# Patient Record
Sex: Female | Born: 1935
Health system: Southern US, Community
[De-identification: ages and names within clinical notes are randomized; demographics above are authoritative.]

## PROBLEM LIST (undated history)

## (undated) DIAGNOSIS — E1159 Type 2 diabetes mellitus with other circulatory complications: Secondary | ICD-10-CM

## (undated) DIAGNOSIS — R2681 Unsteadiness on feet: Secondary | ICD-10-CM

## (undated) DIAGNOSIS — F32A Depression, unspecified: Secondary | ICD-10-CM

## (undated) DIAGNOSIS — R413 Other amnesia: Secondary | ICD-10-CM

## (undated) DIAGNOSIS — E1169 Type 2 diabetes mellitus with other specified complication: Secondary | ICD-10-CM

## (undated) DIAGNOSIS — M810 Age-related osteoporosis without current pathological fracture: Secondary | ICD-10-CM

## (undated) DIAGNOSIS — I152 Hypertension secondary to endocrine disorders: Secondary | ICD-10-CM

## (undated) DIAGNOSIS — E1142 Type 2 diabetes mellitus with diabetic polyneuropathy: Secondary | ICD-10-CM

## (undated) DIAGNOSIS — E079 Disorder of thyroid, unspecified: Secondary | ICD-10-CM

## (undated) DIAGNOSIS — E119 Type 2 diabetes mellitus without complications: Secondary | ICD-10-CM

## (undated) HISTORY — PX: CHOLECYSTECTOMY: SHX55

## (undated) HISTORY — DX: Type 2 diabetes mellitus with other specified complication: E11.69

## (undated) HISTORY — DX: Age-related osteoporosis without current pathological fracture: M81.0

## (undated) HISTORY — DX: Unsteadiness on feet: R26.81

## (undated) HISTORY — DX: Other amnesia: R41.3

## (undated) HISTORY — DX: Type 2 diabetes mellitus without complications: E11.9

## (undated) HISTORY — PX: ABDOMINAL HYSTERECTOMY: SHX81

## (undated) HISTORY — DX: Type 2 diabetes mellitus with other circulatory complications: E11.59

## (undated) HISTORY — PX: COLON SURGERY: SHX602

## (undated) HISTORY — DX: Depression, unspecified: F32.A

## (undated) HISTORY — DX: Hypertension secondary to endocrine disorders: I15.2

## (undated) HISTORY — DX: Disorder of thyroid, unspecified: E07.9

## (undated) HISTORY — DX: Type 2 diabetes mellitus with diabetic polyneuropathy: E11.42

---

## 1998-04-11 ENCOUNTER — Other Ambulatory Visit: Admission: RE | Admit: 1998-04-11 | Discharge: 1998-04-11 | Payer: Self-pay | Admitting: Family Medicine

## 1999-06-05 ENCOUNTER — Other Ambulatory Visit: Admission: RE | Admit: 1999-06-05 | Discharge: 1999-06-05 | Payer: Self-pay | Admitting: Family Medicine

## 1999-11-25 ENCOUNTER — Encounter: Payer: Self-pay | Admitting: Family Medicine

## 1999-11-25 ENCOUNTER — Encounter: Admission: RE | Admit: 1999-11-25 | Discharge: 1999-11-25 | Payer: Self-pay | Admitting: Family Medicine

## 2000-05-06 ENCOUNTER — Encounter: Payer: Self-pay | Admitting: Family Medicine

## 2000-05-06 ENCOUNTER — Encounter: Admission: RE | Admit: 2000-05-06 | Discharge: 2000-05-06 | Payer: Self-pay | Admitting: Family Medicine

## 2000-06-01 ENCOUNTER — Other Ambulatory Visit: Admission: RE | Admit: 2000-06-01 | Discharge: 2000-06-01 | Payer: Self-pay | Admitting: Family Medicine

## 2000-07-02 ENCOUNTER — Ambulatory Visit (HOSPITAL_COMMUNITY): Admission: RE | Admit: 2000-07-02 | Discharge: 2000-07-02 | Payer: Self-pay | Admitting: Gastroenterology

## 2000-09-16 ENCOUNTER — Emergency Department (HOSPITAL_COMMUNITY): Admission: EM | Admit: 2000-09-16 | Discharge: 2000-09-16 | Payer: Self-pay | Admitting: Emergency Medicine

## 2000-09-16 ENCOUNTER — Encounter: Payer: Self-pay | Admitting: Emergency Medicine

## 2001-06-02 ENCOUNTER — Encounter: Admission: RE | Admit: 2001-06-02 | Discharge: 2001-06-02 | Payer: Self-pay | Admitting: Family Medicine

## 2001-06-02 ENCOUNTER — Encounter: Payer: Self-pay | Admitting: Family Medicine

## 2002-06-08 ENCOUNTER — Encounter: Payer: Self-pay | Admitting: Family Medicine

## 2002-06-08 ENCOUNTER — Encounter: Admission: RE | Admit: 2002-06-08 | Discharge: 2002-06-08 | Payer: Self-pay | Admitting: Family Medicine

## 2003-06-20 ENCOUNTER — Encounter: Admission: RE | Admit: 2003-06-20 | Discharge: 2003-06-20 | Payer: Self-pay | Admitting: Family Medicine

## 2003-06-20 ENCOUNTER — Encounter: Payer: Self-pay | Admitting: Family Medicine

## 2004-07-04 ENCOUNTER — Encounter: Admission: RE | Admit: 2004-07-04 | Discharge: 2004-07-04 | Payer: Self-pay | Admitting: Family Medicine

## 2005-08-12 ENCOUNTER — Encounter: Admission: RE | Admit: 2005-08-12 | Discharge: 2005-08-12 | Payer: Self-pay | Admitting: Family Medicine

## 2006-08-20 ENCOUNTER — Encounter: Admission: RE | Admit: 2006-08-20 | Discharge: 2006-08-20 | Payer: Self-pay | Admitting: Family Medicine

## 2007-09-20 ENCOUNTER — Encounter: Admission: RE | Admit: 2007-09-20 | Discharge: 2007-09-20 | Payer: Self-pay | Admitting: Endocrinology

## 2007-12-22 ENCOUNTER — Encounter: Admission: RE | Admit: 2007-12-22 | Discharge: 2007-12-22 | Payer: Self-pay | Admitting: Endocrinology

## 2008-09-20 ENCOUNTER — Encounter: Admission: RE | Admit: 2008-09-20 | Discharge: 2008-09-20 | Payer: Self-pay | Admitting: Endocrinology

## 2009-09-23 ENCOUNTER — Encounter: Admission: RE | Admit: 2009-09-23 | Discharge: 2009-09-23 | Payer: Self-pay | Admitting: Endocrinology

## 2010-09-30 ENCOUNTER — Encounter
Admission: RE | Admit: 2010-09-30 | Discharge: 2010-09-30 | Payer: Self-pay | Source: Home / Self Care | Attending: Endocrinology | Admitting: Endocrinology

## 2010-12-29 ENCOUNTER — Other Ambulatory Visit (HOSPITAL_COMMUNITY): Payer: Self-pay | Admitting: Surgery

## 2010-12-29 ENCOUNTER — Ambulatory Visit (HOSPITAL_COMMUNITY)
Admission: RE | Admit: 2010-12-29 | Discharge: 2010-12-29 | Disposition: A | Discharge status: Home / Self Care | Payer: Medicare Other | Source: Ambulatory Visit | Attending: Surgery | Admitting: Surgery

## 2010-12-29 ENCOUNTER — Encounter (HOSPITAL_COMMUNITY)
Admission: RE | Admit: 2010-12-29 | Discharge: 2010-12-29 | Disposition: A | Discharge status: Home / Self Care | Payer: Medicare Other | Source: Ambulatory Visit | Attending: Surgery | Admitting: Surgery

## 2010-12-29 DIAGNOSIS — Z01812 Encounter for preprocedural laboratory examination: Secondary | ICD-10-CM | POA: Insufficient documentation

## 2010-12-29 DIAGNOSIS — D126 Benign neoplasm of colon, unspecified: Secondary | ICD-10-CM | POA: Insufficient documentation

## 2010-12-29 DIAGNOSIS — Z01818 Encounter for other preprocedural examination: Secondary | ICD-10-CM | POA: Insufficient documentation

## 2010-12-29 DIAGNOSIS — K635 Polyp of colon: Secondary | ICD-10-CM

## 2010-12-29 DIAGNOSIS — Z01810 Encounter for preprocedural cardiovascular examination: Secondary | ICD-10-CM | POA: Insufficient documentation

## 2010-12-29 LAB — COMPREHENSIVE METABOLIC PANEL
ALT: 11 U/L (ref 0–35)
AST: 20 U/L (ref 0–37)
Albumin: 3.9 g/dL (ref 3.5–5.2)
Alkaline Phosphatase: 62 U/L (ref 39–117)
BUN: 10 mg/dL (ref 6–23)
CO2: 28 mEq/L (ref 19–32)
Calcium: 9.2 mg/dL (ref 8.4–10.5)
Chloride: 104 mEq/L (ref 96–112)
Creatinine, Ser: 0.7 mg/dL (ref 0.4–1.2)
GFR calc Af Amer: >60 mL/min (ref >60)
GFR calc non Af Amer: >60 mL/min (ref >60)
Glucose, Bld: 155 mg/dL — ABNORMAL HIGH (ref 70–99)
Potassium: 4.1 mEq/L (ref 3.5–5.1)
Sodium: 136 mEq/L (ref 135–145)
Total Bilirubin: 0.5 mg/dL (ref 0.3–1.2)
Total Protein: 6.4 g/dL (ref 6.0–8.3)

## 2010-12-29 LAB — DIFFERENTIAL
Basophils Absolute: 0.1 10*3/uL (ref 0.0–0.1)
Basophils Relative: 1 % (ref 0–1)
Eosinophils Absolute: 0.3 10*3/uL (ref 0.0–0.7)
Eosinophils Relative: 4 % (ref 0–5)
Lymphocytes Relative: 31 % (ref 12–46)
Lymphs Abs: 2.3 10*3/uL (ref 0.7–4.0)
Monocytes Absolute: 0.6 10*3/uL (ref 0.1–1.0)
Monocytes Relative: 8 % (ref 3–12)
Neutro Abs: 4.1 10*3/uL (ref 1.7–7.7)
Neutrophils Relative %: 57 % (ref 43–77)

## 2010-12-29 LAB — CBC
HCT: 37.5 % (ref 36.0–46.0)
Hemoglobin: 12.5 g/dL (ref 12.0–15.0)
MCH: 29.1 pg (ref 26.0–34.0)
MCHC: 33.3 g/dL (ref 30.0–36.0)
MCV: 87.2 fL (ref 78.0–100.0)
Platelets: 266 10*3/uL (ref 150–400)
RBC: 4.3 MIL/uL (ref 3.87–5.11)
RDW: 13.3 % (ref 11.5–15.5)
WBC: 7.3 10*3/uL (ref 4.0–10.5)

## 2010-12-29 LAB — SURGICAL PCR SCREEN
MRSA, PCR: NEGATIVE
Staphylococcus aureus: POSITIVE — AB

## 2011-01-01 ENCOUNTER — Ambulatory Visit (HOSPITAL_COMMUNITY)
Admission: RE | Admit: 2011-01-01 | Discharge: 2011-01-01 | Disposition: A | Discharge status: Home / Self Care | Payer: Medicare Other | Source: Ambulatory Visit | Attending: Surgery | Admitting: Surgery

## 2011-01-01 ENCOUNTER — Other Ambulatory Visit: Payer: Self-pay | Admitting: Surgery

## 2011-01-01 DIAGNOSIS — D129 Benign neoplasm of anus and anal canal: Secondary | ICD-10-CM | POA: Insufficient documentation

## 2011-01-01 DIAGNOSIS — D128 Benign neoplasm of rectum: Secondary | ICD-10-CM | POA: Insufficient documentation

## 2011-01-01 LAB — GLUCOSE, CAPILLARY
Glucose-Capillary: 152 mg/dL — ABNORMAL HIGH (ref 70–99)
Glucose-Capillary: 224 mg/dL — ABNORMAL HIGH (ref 70–99)

## 2011-01-05 NOTE — Op Note (Signed)
  NAMEVOLANDA, Jennifer Bailey             ACCOUNT NO.:  [id number]  MEDICAL RECORD NO.:  [id number]           PATIENT TYPE:  O  LOCATION:  [facility]                         FACILITY:  [facility]  PHYSICIAN:  Omkar Stratmann A. Mauri Tolen, M.D.DATE OF BIRTH:  1935-04-25  DATE OF PROCEDURE:  01/01/2011 DATE OF DISCHARGE:  01/01/2011                              OPERATIVE REPORT   PREOPERATIVE DIAGNOSIS:  Tubulovillous adenoma, rectal polyp at dentate line.  POSTOPERATIVE DIAGNOSIS:  Tubulovillous adenoma, rectal polyp at dentate line.  PROCEDURE:  Excision of tubulovillous adenoma at dentate line.  SURGEON:  Maisie Fus A. Neira Bentsen, MD  ANESTHESIA:  General endotracheal anesthesia with 0.25% Sensorcaine local.  ESTIMATED BLOOD LOSS:  Minimal.  SPECIMEN:  Rectal polyp with some hemorrhoidal tissue in the patient's left anterior position of the anal canal just at dentate line to pathology.  DRAINS:  None.  INDICATIONS FOR PROCEDURE:  The patient is a 76 year old female who had a pedunculated polyp right at her anal canal at the dentate line.  It was biopsied and shown to be a tubulovillous adenoma.  It was quite large.  Excision was recommended in the operating room.  I examined her in the office, could easily feel this just at the proximal anal canal at the junction the dentate line.  I recommended excision.  She agreed to proceed.  Risk of bleeding, infection, injury to pelvic organs, and recurrence were discussed.  The patient understood the potential risks and wished to proceed.  DESCRIPTION OF PROCEDURE:  The patient was brought to the operating room.  After induction of general anesthesia, she was placed in lithotomy and appropriately padded.  She received preoperative antibiotics.  Perineum and anal canal were prepped and draped in a sterile fashion.  Time-out was done.  Anoscope was inserted.  The polyp was easily seen, was pedunculated just at the distal rectum at the dentate line as Dr. Madilyn Fireman  had shown on her colonoscopy.  I used an Allis to grab the polyp.  This was within a hemorrhoid complex.  I went ahead and used Harmonic scalpel to take some of the external hemorrhoid complex and internal hemorrhoid complex with the polyp.  Specimen was then passed off the field.  I oversewed the mucosa with 3-0 Monocryl leaving the bottom open for drainage.  Anoscopy was then performed and I saw no other lesions within the distal rectal vault.  She did have some grade 2 and grade 3 internal hemorrhoids that I did not address today. Hemostasis was achieved.  Surgicel was placed with lidocaine jelly as well as Gelfoam and the anal canal was packed.  All final counts of sponge, needle, and instruments were found to be correct at this portion of the case.  The patient was awakened and taken to the recovery room in satisfactory condition.  All final counts were found to be correct.     Nazly Digilio A. Nehemiah Montee, M.D.     TAC/MEDQ  D:  01/01/2011  T:  01/02/2011  Job:  [id number]  cc:   Everardo All. Madilyn Fireman, M.D.  Electronically Signed by Harriette Bouillon M.D. on 01/05/2011 02:41:26 PM

## 2011-02-20 NOTE — Procedures (Signed)
[facility]  Patient:    Jennifer Bailey, Jennifer Bailey                    MRN: [id number] Proc. Date: 07/02/00 Adm. Date:  [id number] Attending:  Louie Bun CC:         Delories Heinz, M.D.   Procedure Report  PROCEDURE:  Colonoscopy.  INDICATION FOR PROCEDURE:  History of adenomatous colon polyp requiring surgical resection in 1997.  DESCRIPTION OF PROCEDURE:  The patient was placed in the left lateral decubitus position and placed on the pulse monitor with continuous low flow oxygen delivered by nasal cannula. She was sedated with 40 mg IV Demerol and 4 mg IV Versed for the previous EGD and no further sedation was required for this procedure. The Olympus video colonoscope was inserted into the rectum and advanced to the cecum, confirmed by transillumination at McBurneys point and visualization of the ileocecal valve and appendiceal orifice. The prep was excellent. The cecum, ascending, transverse, descending and sigmoid colon all appeared normal with no masses, polyps, diverticula or other mucosal abnormalities.  There were a few scattered diverticula in the sigmoid colon. The rectum appeared normal and retroflexed view of the anus revealed no obvious internal hemorrhoids. The colonoscope was then withdrawn and the patient returned to the recovery room in stable condition. The patient tolerated the procedure well and there were no immediate complications.  IMPRESSION:  Few left sided diverticula otherwise normal colonoscopy.  PLAN:  Repeat colonoscopy in 5 years. DD:  07/02/00 TD:  07/02/00 Job: [id number]

## 2011-02-20 NOTE — Procedures (Signed)
[facility]  Patient:    Jennifer Bailey, Jennifer Bailey                    MRN: [id number] Proc. Date: 07/02/00 Adm. Date:  [id number] Attending:  Louie Bun CC:         Delories Heinz, M.D.   Procedure Report  PROCEDURE:  Esophagogastroduodenoscopy.  INDICATIONS FOR PROCEDURE:  A patient scheduled for follow-up colonoscopy after history of adenomatous colon polyps who has been complaining of recent onset of epigastric abdominal pain in the last 2-3 months partially relieved by proton pump inhibitor.  DESCRIPTION OF PROCEDURE:  The patient was placed in the left lateral decubitus position then placed on the pulse monitor with continuous low flow oxygen delivered by nasal cannula. She was sedated with 40 mg IV Demerol and 4 mg IV Versed. The Olympus video endoscope was advanced under direct vision into the oropharynx and esophagus. The esophagus was straight and of normal caliber at the squamocolumnar line at 38 cm. It was somewhat irregular but there were no erosions or exudate, no esophageal ulcer, stricture or ring. There appeared to be a 1 to 1.5 cm sliding hiatal hernia. The stomach was entered and a small amount of liquid secretions were suctioned from the fundus. Retroflexed view of the cardia was unremarkable. The fundus, body, antrum and pylorus all appeared normal. The duodenum was entered and both the bulb and second portion were well inspected and appeared to be within normal limits. The scope was then withdrawn a CLOtest obtained. The patient returned to the recovery room in stable condition. The patient tolerated the procedure well and there were no immediate complications.  IMPRESSION:  Small hiatal hernia otherwise normal endoscopy.  PLAN:  Continue Nexium for now. Will follow-up in the office in 2 months. DD:  07/02/00 TD:  07/02/00 Job: [id number]

## 2011-09-03 ENCOUNTER — Other Ambulatory Visit: Payer: Self-pay | Admitting: Endocrinology

## 2011-09-03 DIAGNOSIS — Z1231 Encounter for screening mammogram for malignant neoplasm of breast: Secondary | ICD-10-CM

## 2011-10-02 ENCOUNTER — Ambulatory Visit
Admission: RE | Admit: 2011-10-02 | Discharge: 2011-10-02 | Disposition: A | Discharge status: Home / Self Care | Payer: Medicare Other | Source: Ambulatory Visit | Attending: Endocrinology | Admitting: Endocrinology

## 2011-10-02 DIAGNOSIS — Z1231 Encounter for screening mammogram for malignant neoplasm of breast: Secondary | ICD-10-CM

## 2012-09-07 ENCOUNTER — Other Ambulatory Visit: Payer: Self-pay | Admitting: Endocrinology

## 2012-09-07 DIAGNOSIS — Z1231 Encounter for screening mammogram for malignant neoplasm of breast: Secondary | ICD-10-CM

## 2012-10-18 ENCOUNTER — Ambulatory Visit
Admission: RE | Admit: 2012-10-18 | Discharge: 2012-10-18 | Disposition: A | Discharge status: Home / Self Care | Payer: Medicare Other | Source: Ambulatory Visit | Attending: Endocrinology | Admitting: Endocrinology

## 2012-10-18 DIAGNOSIS — Z1231 Encounter for screening mammogram for malignant neoplasm of breast: Secondary | ICD-10-CM

## 2013-04-10 ENCOUNTER — Other Ambulatory Visit: Payer: Self-pay | Admitting: *Deleted

## 2013-04-10 MED ORDER — INSULIN DETEMIR 100 UNIT/ML FLEXPEN: PEN_INJECTOR | SUBCUTANEOUS | Status: DC

## 2013-04-10 MED ORDER — INSULIN ASPART 100 UNIT/ML FLEXPEN: PEN_INJECTOR | SUBCUTANEOUS | Status: DC

## 2013-04-10 NOTE — Telephone Encounter (Signed)
Ok to fill 

## 2013-05-29 ENCOUNTER — Other Ambulatory Visit: Payer: Self-pay | Admitting: *Deleted

## 2013-05-29 DIAGNOSIS — IMO0001 Reserved for inherently not codable concepts without codable children: Secondary | ICD-10-CM | POA: Insufficient documentation

## 2013-05-29 DIAGNOSIS — Z794 Long term (current) use of insulin: Secondary | ICD-10-CM | POA: Insufficient documentation

## 2013-05-29 DIAGNOSIS — E11649 Type 2 diabetes mellitus with hypoglycemia without coma: Secondary | ICD-10-CM | POA: Insufficient documentation

## 2013-05-29 DIAGNOSIS — E119 Type 2 diabetes mellitus without complications: Secondary | ICD-10-CM

## 2013-06-06 ENCOUNTER — Other Ambulatory Visit (INDEPENDENT_AMBULATORY_CARE_PROVIDER_SITE_OTHER): Payer: Medicare Other

## 2013-06-06 DIAGNOSIS — E119 Type 2 diabetes mellitus without complications: Secondary | ICD-10-CM

## 2013-06-06 LAB — URINALYSIS
Bilirubin Urine: NEGATIVE
Hgb urine dipstick: NEGATIVE
Ketones, ur: NEGATIVE
Leukocytes, UA: NEGATIVE
Nitrite: NEGATIVE
Specific Gravity, Urine: <=1.005
Total Protein, Urine: NEGATIVE
Urine Glucose: NEGATIVE
Urobilinogen, UA: 0.2
pH: 6 (ref 5.0–8.0)

## 2013-06-06 LAB — COMPREHENSIVE METABOLIC PANEL
ALT: 12 U/L (ref 0–35)
AST: 20 U/L (ref 0–37)
Albumin: 3.8 g/dL (ref 3.5–5.2)
Alkaline Phosphatase: 58 U/L (ref 39–117)
BUN: 15 mg/dL (ref 6–23)
CO2: 27 mEq/L (ref 19–32)
Calcium: 9.1 mg/dL (ref 8.4–10.5)
Chloride: 100 mEq/L (ref 96–112)
Creatinine, Ser: 0.8 mg/dL (ref 0.4–1.2)
GFR: 77.47 mL/min (ref >60.00)
Glucose, Bld: 241 mg/dL — ABNORMAL HIGH (ref 70–99)
Potassium: 3.9 mEq/L (ref 3.5–5.1)
Sodium: 131 mEq/L — ABNORMAL LOW (ref 135–145)
Total Bilirubin: 0.7 mg/dL (ref 0.3–1.2)
Total Protein: 6.7 g/dL (ref 6.0–8.3)

## 2013-06-06 LAB — HEMOGLOBIN A1C: Hgb A1c MFr Bld: 8 % — ABNORMAL HIGH (ref 4.6–6.5)

## 2013-06-06 LAB — MICROALBUMIN / CREATININE URINE RATIO
Creatinine,U: 23.8 mg/dL
Microalb Creat Ratio: 4.2 mg/g (ref 0.0–30.0)
Microalb, Ur: 1 mg/dL (ref 0.0–1.9)

## 2013-06-09 ENCOUNTER — Ambulatory Visit (INDEPENDENT_AMBULATORY_CARE_PROVIDER_SITE_OTHER): Payer: Medicare Other | Admitting: Endocrinology

## 2013-06-09 ENCOUNTER — Encounter: Payer: Self-pay | Admitting: Endocrinology

## 2013-06-09 VITALS — BP 130/70 | HR 69 | Temp 98.4°F | Resp 10 | Ht 59.0 in | Wt 130.9 lb

## 2013-06-09 DIAGNOSIS — E871 Hypo-osmolality and hyponatremia: Secondary | ICD-10-CM

## 2013-06-09 DIAGNOSIS — E7800 Pure hypercholesterolemia, unspecified: Secondary | ICD-10-CM

## 2013-06-09 DIAGNOSIS — I10 Essential (primary) hypertension: Secondary | ICD-10-CM

## 2013-06-09 DIAGNOSIS — E039 Hypothyroidism, unspecified: Secondary | ICD-10-CM

## 2013-06-09 DIAGNOSIS — E119 Type 2 diabetes mellitus without complications: Secondary | ICD-10-CM

## 2013-06-09 NOTE — Progress Notes (Signed)
Patient ID: Jennifer Bailey, female   DOB: 09/18/1935, 78 y.o.   MRN: [id number]  Jennifer Bailey is an 78 y.o. female.   Reason for Appointment: Diabetes follow-up   History of Present Illness   Diagnosis: Type 2 DIABETES MELITUS, date of diagnosis: 2003  She has been on insulin for several years but continues to have labile blood sugars despite using basal bolus regimen She is very compliant with her insulin at mealtimes and is consistent with the timing of her Levemir insulin twice a day  she is taking small amounts of insulin and is not on any oral hypoglycemics, appears to be completely insulin-dependent Her diet is somewhat variable but she is usually having balanced meals and not eating out excessively     She had gained some weight on the last visit but has lost 3 pounds  RECENT history: She appears to be getting more significant hypoglycemia and then the last visit especially on waking up, lowest reading 47 Also occasionally has low readings after supper More recently has not had significant hyperglycemia with only occasional readings over 200, was higher this morning possibly from rebound of low sugar last night     Insulin regimen: Levemir 8 bid (9 in am till 10d ago) Bfst 3-4, lunch 3-4 acs3-5        Proper timing of medications in relation to meals: Yes.          Monitors blood glucose:  up to 4 times a day.    Glucometer: One Touch.          Blood Glucose readings from meter download: readings before breakfast:  Recently 47-226, midmorning 47-218, lunchtime 58-178 and suppertime 80-208  PC supper/bedtime 52-240 Overall median 122, lowest median after supper, only 70 with a few readings; fasting median 90 and highest median of 141 at bedtime  Hypoglycemia frequency: 3 times a week recently, mostly in the morning, occasionally after supper and sometimes after breakfast.          Meals: 3 meals per day.  trying to have smaller portions for the last 3-4 weeks and drinking  more water    Physical activity: exercise: Trying to do more walking            Dietician visit: Most recent: 2004           Complications: are: Mild neuropathy controlled symptomatically     The last HbgA1c was reported as 7.3, previously 6.9    Wt Readings from Last 3 Encounters:  06/09/13 130 lb 14.4 oz (59.376 kg)   HYPONATREMIA: Her sodium is 131, usually normal, not on HCTZ. She is trying to drink much more water to lose weight  LABS:  Appointment on 06/06/2013  Component Date Value Range Status  . Hemoglobin [name] 06/06/2013 8.0* 4.6 - 6.5 % Final   Glycemic Control Guidelines for People with Diabetes:Non Diabetic:  <6%Goal of Therapy: <7%Additional Action Suggested:  >8%   . Sodium 06/06/2013 131* 135 - 145 mEq/L Final  . Potassium 06/06/2013 3.9  3.5 - 5.1 mEq/L Final  . Chloride 06/06/2013 100  96 - 112 mEq/L Final  . CO2 06/06/2013 27  19 - 32 mEq/L Final  . Glucose, Bld 06/06/2013 241* 70 - 99 mg/dL Final  . BUN 78/29/5621 15  6 - 23 mg/dL Final  . Creatinine, Ser 06/06/2013 0.8  0.4 - 1.2 mg/dL Final  . Total Bilirubin 06/06/2013 0.7  0.3 - 1.2 mg/dL Final  . Alkaline Phosphatase 06/06/2013  58  39 - 117 U/L Final  . AST 06/06/2013 20  0 - 37 U/L Final  . ALT 06/06/2013 12  0 - 35 U/L Final  . Total Protein 06/06/2013 6.7  6.0 - 8.3 g/dL Final  . Albumin 40/98/1191 3.8  3.5 - 5.2 g/dL Final  . Calcium 47/82/9562 9.1  8.4 - 10.5 mg/dL Final  . GFR 13/05/6577 77.47  >60.00 mL/min Final  . Color, Urine 06/06/2013 LT. YELLOW  Yellow;Lt. Yellow Final  . APPearance 06/06/2013 CLEAR  Clear Final  . Specific Gravity, Urine 06/06/2013 <=1.005  1.000 - 1.030 Final  . pH 06/06/2013 6.0  5.0 - 8.0 Final  . Total Protein, Urine 06/06/2013 NEGATIVE  Negative Final  . Urine Glucose 06/06/2013 NEGATIVE  Negative Final  . Ketones, ur 06/06/2013 NEGATIVE  Negative Final  . Bilirubin Urine 06/06/2013 NEGATIVE  Negative Final  . Hgb urine dipstick 06/06/2013 NEGATIVE  Negative Final   . Urobilinogen, UA 06/06/2013 0.2  0.0 - 1.0 Final  . Leukocytes, UA 06/06/2013 NEGATIVE  Negative Final  . Nitrite 06/06/2013 NEGATIVE  Negative Final  . Microalb, Ur 06/06/2013 1.0  0.0 - 1.9 mg/dL Final  . Creatinine,U 46/96/2952 23.8   Final  . Microalb Creat Ratio 06/06/2013 4.2  0.0 - 30.0 mg/g Final      Medication List       This list is accurate as of: 06/09/13 11:59 PM.  Always use your most recent med list.               alendronate 70 MG tablet  Commonly known as:  FOSAMAX     aspirin 81 MG tablet  Take 81 mg by mouth daily.     BD PEN NEEDLE NANO U/F 32G X 4 MM Misc  Generic drug:  Insulin Pen Needle     fish oil-omega-3 fatty acids 1000 MG capsule  Take 2 g by mouth daily. Two a day     gabapentin 100 MG capsule  Commonly known as:  NEURONTIN  100 mg. 3 capsules qhs     insulin aspart 100 UNIT/ML Sopn FlexPen  Commonly known as:  NOVOLOG FLEXPEN  INJECT 3 TO 5 UNITS SUBCUTANEOUSLY BEFORE MEALS AS DIRECTED.     Insulin Detemir 100 UNIT/ML Sopn  INJECT 8 UNITS SUBCUTANEOUSLY IN THE MORNING AND 8 UNITS AT NIGHT.     levothyroxine 75 MCG tablet  Commonly known as:  SYNTHROID, LEVOTHROID  75 mcg.     losartan 50 MG tablet  Commonly known as:  COZAAR  50 mg.     lovastatin 40 MG tablet  Commonly known as:  MEVACOR  40 mg.     omeprazole 20 MG capsule  Commonly known as:  PRILOSEC  20 mg.     ONE TOUCH ULTRA TEST test strip  Generic drug:  glucose blood     PRESERVISION AREDS 2 Caps  Take by mouth.     vitamin C 100 MG tablet  Take 100 mg by mouth daily.     Vitamin D (Ergocalciferol) 50000 UNITS Caps capsule  Commonly known as:  DRISDOL  Take 50,000 Units by mouth every 30 (thirty) days.        Allergies:  Allergies  Allergen Reactions  . Macrodantin [Nitrofurantoin Macrocrystal]   . Penicillins     No past medical history on file.  Past Surgical History  Procedure Laterality Date  . Abdominal hysterectomy      Family  History  Problem Relation Age of  Onset  . Cancer Mother   . Diabetes Brother     Social History:  reports that she has never smoked. She has never used smokeless tobacco. Her alcohol and drug histories are not on file.  Review of Systems:  HYPERTENSION:  she thinks her blood pressure readings are 95-120 systolic but does not feel lightheaded, blood pressure usually is higher in the office  HYPOTHYROIDISM: Fairly stable with 75 mcg supplement  HYPERLIPIDEMIA: The lipid abnormality consists of elevated LDL, currently taking lovastatin with good control.  She has had a history of burning and tingling in her feet controlled with gabapentin      Examination:   BP 130/70  Pulse 69  Temp(Src) 98.4 F (36.9 C)  Resp 10  Ht 4\' 11"  (1.499 m)  Wt 130 lb 14.4 oz (59.376 kg)  BMI 26.42 kg/m2  SpO2 98%  Body mass index is 26.42 kg/(m^2).   No pedal edema Biceps reflexes are normal  ASSESSMENT/ PLAN::   Diabetes type 2   The patient's diabetes control appears to be more difficult with tendency to hypoglycemia in the morning and after supper but not clear why her [name] is higher at 8% even though her recent average glucose is only 128 However her glucose monitor has the wrong date and time and not clear if other readings are recent Also her blood sugars appear to be relatively more stable compared to previous tendency to labile readings Low blood sugars may be more related to her improving her diet and reducing portions but also a low early morning She will need to monitor hypoglycemia first before considering high readings, so these are from rebound  PLAN: She will reduce her evening Levemir and also one unit from her suppertime coverage Discussed how to adjust her evening Levemir based on fasting blood sugar trend She will call if she has consistent hypoglycemia  HYPONATREMIA: This is unusual and may be related to her drinking large volumes of water recently, advised her to cut back  on this by at least a third  [facility] 06/14/2013, 12:48 PM

## 2013-06-09 NOTE — Patient Instructions (Addendum)
Use 7 Levemir in pms; if am sugar is still low then cut to 6  use 2-3 Novolog at supper  Reduce daytime fluid by at least a 1/3

## 2013-06-14 ENCOUNTER — Encounter: Payer: Self-pay | Admitting: Endocrinology

## 2013-06-14 DIAGNOSIS — E1159 Type 2 diabetes mellitus with other circulatory complications: Secondary | ICD-10-CM | POA: Insufficient documentation

## 2013-06-14 DIAGNOSIS — E1169 Type 2 diabetes mellitus with other specified complication: Secondary | ICD-10-CM | POA: Insufficient documentation

## 2013-07-10 ENCOUNTER — Other Ambulatory Visit: Payer: Self-pay | Admitting: *Deleted

## 2013-07-10 MED ORDER — LEVOTHYROXINE SODIUM 75 MCG PO TABS: 75.0000 ug | ORAL_TABLET | Freq: Every day | ORAL | Status: DC

## 2013-08-07 ENCOUNTER — Other Ambulatory Visit (INDEPENDENT_AMBULATORY_CARE_PROVIDER_SITE_OTHER): Payer: Medicare PPO

## 2013-08-07 DIAGNOSIS — E871 Hypo-osmolality and hyponatremia: Secondary | ICD-10-CM

## 2013-08-07 DIAGNOSIS — E039 Hypothyroidism, unspecified: Secondary | ICD-10-CM

## 2013-08-07 DIAGNOSIS — E7800 Pure hypercholesterolemia, unspecified: Secondary | ICD-10-CM

## 2013-08-07 LAB — TSH: TSH: 1.13 u[IU]/mL (ref 0.35–5.50)

## 2013-08-07 LAB — BASIC METABOLIC PANEL
BUN: 17 mg/dL (ref 6–23)
CO2: 29 mEq/L (ref 19–32)
Calcium: 9.5 mg/dL (ref 8.4–10.5)
Chloride: 103 mEq/L (ref 96–112)
Creatinine, Ser: 0.8 mg/dL (ref 0.4–1.2)
GFR: 79.82 mL/min (ref >60.00)
Glucose, Bld: 181 mg/dL — ABNORMAL HIGH (ref 70–99)
Potassium: 4.5 mEq/L (ref 3.5–5.1)
Sodium: 137 mEq/L (ref 135–145)

## 2013-08-07 LAB — T4, FREE: Free T4: 1.07 ng/dL (ref 0.60–1.60)

## 2013-08-07 LAB — LIPID PANEL
Cholesterol: 140 mg/dL (ref 0–200)
HDL: 50.2 mg/dL (ref >39.00)
LDL Cholesterol: 67 mg/dL (ref 0–99)
Total CHOL/HDL Ratio: 3
Triglycerides: 116 mg/dL (ref 0.0–149.0)
VLDL: 23.2 mg/dL (ref 0.0–40.0)

## 2013-08-11 ENCOUNTER — Ambulatory Visit (INDEPENDENT_AMBULATORY_CARE_PROVIDER_SITE_OTHER): Payer: Medicare PPO | Admitting: Endocrinology

## 2013-08-11 ENCOUNTER — Encounter: Payer: Self-pay | Admitting: Endocrinology

## 2013-08-11 VITALS — BP 128/72 | HR 69 | Temp 98.5°F | Resp 12 | Ht 59.0 in | Wt 131.0 lb

## 2013-08-11 DIAGNOSIS — E039 Hypothyroidism, unspecified: Secondary | ICD-10-CM

## 2013-08-11 DIAGNOSIS — E7800 Pure hypercholesterolemia, unspecified: Secondary | ICD-10-CM

## 2013-08-11 DIAGNOSIS — IMO0001 Reserved for inherently not codable concepts without codable children: Secondary | ICD-10-CM

## 2013-08-11 DIAGNOSIS — E1142 Type 2 diabetes mellitus with diabetic polyneuropathy: Secondary | ICD-10-CM

## 2013-08-11 DIAGNOSIS — I10 Essential (primary) hypertension: Secondary | ICD-10-CM

## 2013-08-11 NOTE — Patient Instructions (Addendum)
Novolog lunch 2-3 units; Supper 4-5 units  Levemir 6 in pm if am sugars get low again

## 2013-08-11 NOTE — Progress Notes (Signed)
Patient ID: Jennifer Bailey, female   DOB: 1935-04-02, 78 y.o.   MRN: [id number]  Jennifer Bailey is an 78 y.o. female.   Reason for Appointment: Diabetes follow-up   History of Present Illness   Diagnosis: Type 2 DIABETES MELITUS, date of diagnosis: 2003  She has been on insulin for several years but continues to have labile blood sugars despite using basal bolus regimen She is very compliant with her insulin at mealtimes and is consistent with the timing of her Levemir insulin twice a day Is taking small amounts of insulin and is not on any oral hypoglycemics, appears to be completely insulin-dependent Her diet is somewhat variable but she is usually having balanced meals and not eating out excessively      RECENT history: She is not having as much hypoglycemia in the morning with reducing her evening Levemir in the last visit However still has significant variability in blood sugars at all times More recently is having low normal readings after lunch but relatively higher readings after supper     Insulin regimen: Levemir 8--7 bid  mealtime coverage breakfast:  3-4, lunch 3-4 acs3-5        Proper timing of medications in relation to meals: Yes.          Monitors blood glucose:  up to 4 times a day.    Glucometer: One Touch.          Blood Glucose readings from meter download: readings before breakfast: 61-217 with MEDIAN 127 LUNCH time 65 with a range of 65-266 Before supper 71-289 with median 186 and PC supper/bedtime 118-335 with median 222 Hypoglycemia frequency: Minimal with occasional 60s in the morning or afternoon      Meals: 3 meals per day.  trying to have smaller portions for the last 3-4 weeks and drinking more water    Physical activity: exercise: Trying to do more walking or tv exercise           Dietician visit: Most recent: 2004           Complications: are: Mild neuropathy controlled symptomatically     The last HbgA1c was reported as 7.3   Wt Readings from  Last 3 Encounters:  08/11/13 131 lb (59.421 kg)  06/09/13 130 lb 14.4 oz (59.376 kg)   Lab Results  Component Value Date   HGBA1C 8.0* 06/06/2013   Lab Results  Component Value Date   MICROALBUR 1.0 06/06/2013   LDLCALC 67 08/07/2013   CREATININE 0.8 08/07/2013     LABS:  Appointment on 08/07/2013  Component Date Value Range Status  . Sodium 08/07/2013 137  135 - 145 mEq/L Final  . Potassium 08/07/2013 4.5  3.5 - 5.1 mEq/L Final  . Chloride 08/07/2013 103  96 - 112 mEq/L Final  . CO2 08/07/2013 29  19 - 32 mEq/L Final  . Glucose, Bld 08/07/2013 181* 70 - 99 mg/dL Final  . BUN 78/29/5621 17  6 - 23 mg/dL Final  . Creatinine, Ser 08/07/2013 0.8  0.4 - 1.2 mg/dL Final  . Calcium 30/86/5784 9.5  8.4 - 10.5 mg/dL Final  . GFR 69/62/9528 79.82  >60.00 mL/min Final  . Free T4 08/07/2013 1.07  0.60 - 1.60 ng/dL Final  . TSH 41/32/4401 1.13  0.35 - 5.50 uIU/mL Final  . Cholesterol 08/07/2013 140  0 - 200 mg/dL Final   ATP III Classification       Desirable:  < 200 mg/dL  Borderline High:  200 - 239 mg/dL          High:  > = 409 mg/dL  . Triglycerides 08/07/2013 116.0  0.0 - 149.0 mg/dL Final   Normal:  <811 mg/dLBorderline High:  150 - 199 mg/dL  . HDL 08/07/2013 50.20  >39.00 mg/dL Final  . VLDL 91/47/8295 23.2  0.0 - 40.0 mg/dL Final  . LDL Cholesterol 08/07/2013 67  0 - 99 mg/dL Final  . Total CHOL/HDL Ratio 08/07/2013 3   Final                  Men          Women1/2 Average Risk     3.4          3.3Average Risk          5.0          4.42X Average Risk          9.6          7.13X Average Risk          15.0          11.0                          Medication List       This list is accurate as of: 08/11/13 11:55 AM.  Always use your most recent med list.               alendronate 70 MG tablet  Commonly known as:  FOSAMAX     aspirin 81 MG tablet  Take 81 mg by mouth daily.     BD PEN NEEDLE NANO U/F 32G X 4 MM Misc  Generic drug:  Insulin Pen Needle     fish  oil-omega-3 fatty acids 1000 MG capsule  Take 2 g by mouth daily. Two a day     FLUARIX QUADRIVALENT 0.5 ML injection  Generic drug:  influenza vac split quadrivalent PF     gabapentin 100 MG capsule  Commonly known as:  NEURONTIN  100 mg. 3 capsules qhs     insulin aspart 100 UNIT/ML Sopn FlexPen  Commonly known as:  NOVOLOG FLEXPEN  INJECT 3 TO 5 UNITS SUBCUTANEOUSLY BEFORE MEALS AS DIRECTED.     Insulin Detemir 100 UNIT/ML Sopn  INJECT 8 UNITS SUBCUTANEOUSLY IN THE MORNING AND 8 UNITS AT NIGHT.     levothyroxine 75 MCG tablet  Commonly known as:  SYNTHROID, LEVOTHROID  Take 1 tablet (75 mcg total) by mouth daily before breakfast.     losartan 50 MG tablet  Commonly known as:  COZAAR  50 mg.     lovastatin 40 MG tablet  Commonly known as:  MEVACOR  40 mg.     omeprazole 20 MG capsule  Commonly known as:  PRILOSEC  20 mg.     ONE TOUCH ULTRA TEST test strip  Generic drug:  glucose blood     PRESERVISION AREDS 2 Caps  Take by mouth.     vitamin C 100 MG tablet  Take 100 mg by mouth daily.     Vitamin D (Ergocalciferol) 50000 UNITS Caps capsule  Commonly known as:  DRISDOL  Take 50,000 Units by mouth every 30 (thirty) days.        Allergies:  Allergies  Allergen Reactions  . Macrodantin [Nitrofurantoin Macrocrystal]   . Penicillins     No past medical history on file.  Past  Surgical History  Procedure Laterality Date  . Abdominal hysterectomy      Family History  Problem Relation Age of Onset  . Cancer Mother   . Diabetes Brother     Social History:  reports that she has never smoked. She has never used smokeless tobacco. Her alcohol and drug histories are not on file.  Review of Systems:  HYPERTENSION:  she thinks her blood pressure readings are  105-125 systolic but does not feel lightheaded, blood pressure usually is higher in the office  HYPOTHYROIDISM: Fairly stable with 75 mcg supplement  HYPERLIPIDEMIA: The lipid abnormality consists  of elevated LDL, currently taking lovastatin with good control.  She has had a history of burning and tingling in her feet controlled with gabapentin      Examination:   BP 128/72  Pulse 69  Temp(Src) 98.5 F (36.9 C)  Resp 12  Ht 4\' 11"  (1.499 m)  Wt 131 lb (59.421 kg)  BMI 26.44 kg/m2  SpO2 99%  Body mass index is 26.44 kg/(m^2).   No pedal edema  ASSESSMENT/ PLAN::   Diabetes type 2   The patient's diabetes control appears to be relatively better although she still has her usual fluctuation Her median blood sugar is moderately increased but her standard deviation is nearly 70 However considering her age and the duration of diabetes and propensity to hypoglycemia she is probably reasonably well-controlled Currently her morning blood sugars again trending relatively lower but not consistent despite taking a unit less on her Levemir since her last visit Postprandial readings are relatively lower after lunch but higher after supper and insulin doses will need to be adjusted accordingly  PLAN: She will reduce her evening Levemir by 1 unit if her morning sugars start getting low again NOVOLOG insulin: She will need to reduce it by at least one unit for lunch and increase it by 1 unit for supper She will call if she has inconsistent blood sugars  HYPONATREMIA:  Resolved with stopping excessive fluid intake  Daxson Reffett 08/11/2013, 11:55 AM

## 2013-08-14 DIAGNOSIS — E1142 Type 2 diabetes mellitus with diabetic polyneuropathy: Secondary | ICD-10-CM | POA: Insufficient documentation

## 2013-08-22 ENCOUNTER — Other Ambulatory Visit: Payer: Self-pay | Admitting: Endocrinology

## 2013-09-11 ENCOUNTER — Other Ambulatory Visit: Payer: Self-pay | Admitting: *Deleted

## 2013-09-11 MED ORDER — LOSARTAN POTASSIUM 50 MG PO TABS: 50.0000 mg | ORAL_TABLET | Freq: Every day | ORAL | Status: DC

## 2013-09-20 ENCOUNTER — Other Ambulatory Visit: Payer: Self-pay | Admitting: Endocrinology

## 2013-10-10 ENCOUNTER — Other Ambulatory Visit: Payer: Self-pay

## 2013-10-10 DIAGNOSIS — Z1231 Encounter for screening mammogram for malignant neoplasm of breast: Secondary | ICD-10-CM

## 2013-11-09 ENCOUNTER — Other Ambulatory Visit: Payer: Self-pay | Admitting: Endocrinology

## 2013-11-10 ENCOUNTER — Ambulatory Visit
Admission: RE | Admit: 2013-11-10 | Discharge: 2013-11-10 | Disposition: A | Discharge status: Home / Self Care | Payer: Medicare Other | Source: Ambulatory Visit

## 2013-11-10 DIAGNOSIS — Z1231 Encounter for screening mammogram for malignant neoplasm of breast: Secondary | ICD-10-CM

## 2013-11-13 ENCOUNTER — Other Ambulatory Visit: Payer: Medicare PPO

## 2013-11-13 ENCOUNTER — Ambulatory Visit: Payer: Medicare PPO

## 2013-11-14 ENCOUNTER — Other Ambulatory Visit (INDEPENDENT_AMBULATORY_CARE_PROVIDER_SITE_OTHER): Payer: Medicare Other

## 2013-11-14 ENCOUNTER — Other Ambulatory Visit: Payer: Medicare Other

## 2013-11-14 DIAGNOSIS — E1142 Type 2 diabetes mellitus with diabetic polyneuropathy: Secondary | ICD-10-CM

## 2013-11-14 DIAGNOSIS — IMO0001 Reserved for inherently not codable concepts without codable children: Secondary | ICD-10-CM

## 2013-11-14 DIAGNOSIS — E1165 Type 2 diabetes mellitus with hyperglycemia: Principal | ICD-10-CM

## 2013-11-14 LAB — COMPREHENSIVE METABOLIC PANEL
ALT: 13 U/L (ref 0–35)
AST: 18 U/L (ref 0–37)
Albumin: 3.7 g/dL (ref 3.5–5.2)
Alkaline Phosphatase: 66 U/L (ref 39–117)
BUN: 15 mg/dL (ref 6–23)
CO2: 26 mEq/L (ref 19–32)
Calcium: 9.2 mg/dL (ref 8.4–10.5)
Chloride: 104 mEq/L (ref 96–112)
Creatinine, Ser: 0.7 mg/dL (ref 0.4–1.2)
GFR: 87.82 mL/min (ref >60.00)
Glucose, Bld: 129 mg/dL — ABNORMAL HIGH (ref 70–99)
Potassium: 4.5 mEq/L (ref 3.5–5.1)
Sodium: 138 mEq/L (ref 135–145)
Total Bilirubin: 0.6 mg/dL (ref 0.3–1.2)
Total Protein: 6.7 g/dL (ref 6.0–8.3)

## 2013-11-14 LAB — CBC WITH DIFFERENTIAL/PLATELET
Basophils Absolute: 0 10*3/uL (ref 0.0–0.1)
Basophils Relative: 0.4 % (ref 0.0–3.0)
Eosinophils Absolute: 0.6 10*3/uL (ref 0.0–0.7)
Eosinophils Relative: 6.5 % — ABNORMAL HIGH (ref 0.0–5.0)
HCT: 36.7 % (ref 36.0–46.0)
Hemoglobin: 12.1 g/dL (ref 12.0–15.0)
Lymphocytes Relative: 27.8 % (ref 12.0–46.0)
Lymphs Abs: 2.5 10*3/uL (ref 0.7–4.0)
MCHC: 33 g/dL (ref 30.0–36.0)
MCV: 91.3 fl (ref 78.0–100.0)
Monocytes Absolute: 0.7 10*3/uL (ref 0.1–1.0)
Monocytes Relative: 7.9 % (ref 3.0–12.0)
Neutro Abs: 5.2 10*3/uL (ref 1.4–7.7)
Neutrophils Relative %: 57.4 % (ref 43.0–77.0)
Platelets: 273 10*3/uL (ref 150.0–400.0)
RBC: 4.02 Mil/uL (ref 3.87–5.11)
RDW: 13.9 % (ref 11.5–14.6)
WBC: 9 10*3/uL (ref 4.5–10.5)

## 2013-11-14 LAB — HEMOGLOBIN A1C: Hgb A1c MFr Bld: 8.5 % — ABNORMAL HIGH (ref 4.6–6.5)

## 2013-11-17 ENCOUNTER — Encounter: Payer: Self-pay | Admitting: Endocrinology

## 2013-11-17 ENCOUNTER — Ambulatory Visit (INDEPENDENT_AMBULATORY_CARE_PROVIDER_SITE_OTHER): Payer: Medicare Other | Admitting: Endocrinology

## 2013-11-17 VITALS — BP 102/50 | HR 70 | Temp 98.3°F | Resp 14 | Ht 59.0 in | Wt 130.6 lb

## 2013-11-17 DIAGNOSIS — IMO0001 Reserved for inherently not codable concepts without codable children: Secondary | ICD-10-CM

## 2013-11-17 DIAGNOSIS — M810 Age-related osteoporosis without current pathological fracture: Secondary | ICD-10-CM

## 2013-11-17 DIAGNOSIS — E1165 Type 2 diabetes mellitus with hyperglycemia: Principal | ICD-10-CM

## 2013-11-17 DIAGNOSIS — E039 Hypothyroidism, unspecified: Secondary | ICD-10-CM

## 2013-11-17 DIAGNOSIS — I10 Essential (primary) hypertension: Secondary | ICD-10-CM

## 2013-11-17 NOTE — Patient Instructions (Signed)
Losartan 1/2 tab daily

## 2013-11-17 NOTE — Progress Notes (Signed)
Patient ID: Jennifer Bailey, female   DOB: [birth year], 79 y.o.   MRN: [id number]   Reason for Appointment: Diabetes follow-up   History of Present Illness   Diagnosis: Type 2 DIABETES MELITUS, date of diagnosis: 2003  She has been on insulin for several years but continues to have labile blood sugars despite using basal bolus regimen She is very compliant with her insulin at mealtimes and is consistent with the timing of her Levemir insulin twice a day Is taking small amounts of insulin and is not on any oral hypoglycemics, appears to be completely insulin-dependent Her diet is somewhat variable but she is usually having balanced meals and not eating out excessively      RECENT history:   Her [name] is relatively higher at 8.5% even though her blood sugars are not averaging very high at home She does have some fluctuation in her blood sugars especially at lunchtime and bedtime which is not unusual He usually trying to adjust her mealtime dose based on her meal size and blood sugar level but still unexpected high readings She is not having as much hypoglycemia      Insulin regimen: Levemir 8--7 bid  mealtime coverage breakfast: 3-4, lunch and supper 4-5       Proper timing of medications in relation to meals: Yes.          Monitors blood glucose:  up to 4 times a day.    Glucometer: One Touch.          Blood Glucose readings from meter download:   PREMEAL Breakfast Lunch Dinner Bedtime Overall  Glucose range:  60-266   52-317   83-245   80-305    Mean/median:  147      153 +/-65    Hypoglycemia frequency: Occasionally waking up and once after lunch and once after supper last night, treated with Soft drink or glucose tablets      Meals: 3 meals per day.  Usually has small portions Physical activity: exercise: Less walking recently, doing some tv exercise           Dietician visit: Most recent: [id number]           Complications: are: Mild neuropathy controlled symptomatically      Wt  Readings from Last 3 Encounters:  11/17/13 130 lb 9.6 oz (59.24 kg)  08/11/13 131 lb (59.421 kg)  06/09/13 130 lb 14.4 oz (59.376 kg)   Lab Results  Component Value Date   HGBA1C 8.5* 11/14/2013   HGBA1C 8.0* 06/06/2013   Lab Results  Component Value Date   MICROALBUR 1.0 06/06/2013   LDLCALC 67 08/07/2013   CREATININE 0.7 11/14/2013     LABS:  Appointment on 11/14/2013  Component Date Value Ref Range Status  . Hemoglobin [name] 11/14/2013 8.5* 4.6 - 6.5 % Final   Glycemic Control Guidelines for People with Diabetes:Non Diabetic:  <6%Goal of Therapy: <7%Additional Action Suggested:  >8%   . Sodium 11/14/2013 138  135 - 145 mEq/L Final  . Potassium 11/14/2013 4.5  3.5 - 5.1 mEq/L Final  . Chloride 11/14/2013 104  96 - 112 mEq/L Final  . CO2 11/14/2013 26  19 - 32 mEq/L Final  . Glucose, Bld 11/14/2013 129* 70 - 99 mg/dL Final  . BUN 11/14/2013 15  6 - 23 mg/dL Final  . Creatinine, Ser 11/14/2013 0.7  0.4 - 1.2 mg/dL Final  . Total Bilirubin 11/14/2013 0.6  0.3 - 1.2 mg/dL Final  . Alkaline Phosphatase 11/14/2013  66  39 - 117 U/L Final  . AST 11/14/2013 18  0 - 37 U/L Final  . ALT 11/14/2013 13  0 - 35 U/L Final  . Total Protein 11/14/2013 6.7  6.0 - 8.3 g/dL Final  . Albumin 11/14/2013 3.7  3.5 - 5.2 g/dL Final  . Calcium 11/14/2013 9.2  8.4 - 10.5 mg/dL Final  . GFR 11/14/2013 87.82  >60.00 mL/min Final  . WBC 11/14/2013 9.0  4.5 - 10.5 K/uL Final  . RBC 11/14/2013 4.02  3.87 - 5.11 Mil/uL Final  . Hemoglobin 11/14/2013 12.1  12.0 - 15.0 g/dL Final  . HCT 11/14/2013 36.7  36.0 - 46.0 % Final  . MCV 11/14/2013 91.3  78.0 - 100.0 fl Final  . MCHC 11/14/2013 33.0  30.0 - 36.0 g/dL Final  . RDW 11/14/2013 13.9  11.5 - 14.6 % Final  . Platelets 11/14/2013 273.0  150.0 - 400.0 K/uL Final  . Neutrophils Relative % 11/14/2013 57.4  43.0 - 77.0 % Final  . Lymphocytes Relative 11/14/2013 27.8  12.0 - 46.0 % Final  . Monocytes Relative 11/14/2013 7.9  3.0 - 12.0 % Final  . Eosinophils  Relative 11/14/2013 6.5* 0.0 - 5.0 % Final  . Basophils Relative 11/14/2013 0.4  0.0 - 3.0 % Final  . Neutro Abs 11/14/2013 5.2  1.4 - 7.7 K/uL Final  . Lymphs Abs 11/14/2013 2.5  0.7 - 4.0 K/uL Final  . Monocytes Absolute 11/14/2013 0.7  0.1 - 1.0 K/uL Final  . Eosinophils Absolute 11/14/2013 0.6  0.0 - 0.7 K/uL Final  . Basophils Absolute 11/14/2013 0.0  0.0 - 0.1 K/uL Final      Medication List       This list is accurate as of: 11/17/13 10:41 AM.  Always use your most recent med list.               alendronate 70 MG tablet  Commonly known as:  FOSAMAX  TAKE 1 TABLET BY MOUTH ONCE WEEKLY.     aspirin 81 MG tablet  Take 81 mg by mouth daily.     BD PEN NEEDLE NANO U/F 32G X 4 MM Misc  Generic drug:  Insulin Pen Needle     BD PEN NEEDLE NANO U/F 32G X 4 MM Misc  Generic drug:  Insulin Pen Needle  USE AS DIRECTED FIVE TIMES A DAY.     fish oil-omega-3 fatty acids 1000 MG capsule  Take 2 g by mouth daily. Two a day     FLUARIX QUADRIVALENT 0.5 ML injection  Generic drug:  influenza vac split quadrivalent PF     gabapentin 100 MG capsule  Commonly known as:  NEURONTIN  TAKE 3 TO 4 CAPSULES BY MOUTH AT BEDTIME.     insulin aspart 100 UNIT/ML FlexPen  Commonly known as:  NOVOLOG FLEXPEN  INJECT 3 TO 5 UNITS SUBCUTANEOUSLY BEFORE MEALS AS DIRECTED.     Insulin Detemir 100 UNIT/ML Pen  Commonly known as:  LEVEMIR  INJECT 8 UNITS SUBCUTANEOUSLY IN THE MORNING AND 7 UNITS AT NIGHT.     levothyroxine 75 MCG tablet  Commonly known as:  SYNTHROID, LEVOTHROID  Take 1 tablet (75 mcg total) by mouth daily before breakfast.     losartan 50 MG tablet  Commonly known as:  COZAAR  Take 1 tablet (50 mg total) by mouth daily.     lovastatin 40 MG tablet  Commonly known as:  MEVACOR  40 mg.     omeprazole 20  MG capsule  Commonly known as:  PRILOSEC  20 mg.     ONE TOUCH ULTRA TEST test strip  Generic drug:  glucose blood     PRESERVISION AREDS 2 Caps  Take by mouth.      vitamin C 100 MG tablet  Take 100 mg by mouth daily.     Vitamin D (Ergocalciferol) 50000 UNITS Caps capsule  Commonly known as:  DRISDOL  Take 50,000 Units by mouth every 30 (thirty) days.        Allergies:  Allergies  Allergen Reactions  . Macrodantin [Nitrofurantoin Macrocrystal]   . Penicillins     No past medical history on file.  Past Surgical History  Procedure Laterality Date  . Abdominal hysterectomy      Family History  Problem Relation Age of Onset  . Cancer Mother   . Diabetes Brother     Social History:  reports that she has never smoked. She has never used smokeless tobacco. Her alcohol and drug histories are not on file.  Review of Systems:  HYPERTENSION:  she thinks her blood pressure readings are 998-338 systolic but does not feel lightheaded, blood pressure usually is higher in the office  HYPOTHYROIDISM: Fairly stable with 75 mcg supplement  Lab Results  Component Value Date   TSH 1.13 08/07/2013    HYPERLIPIDEMIA: The lipid abnormality consists of elevated LDL, currently taking lovastatin with good control.  She has had a history of burning and tingling in her feet controlled with gabapentin      Examination:   BP 102/50  Pulse 70  Temp(Src) 98.3 F (36.8 C)  Resp 14  Ht 4\' 11"  (1.499 m)  Wt 130 lb 9.6 oz (59.24 kg)  BMI 26.36 kg/m2  SpO2 98%  Body mass index is 26.36 kg/(m^2).   No pedal edema  ASSESSMENT/ PLAN::   Diabetes type 2   The patient's diabetes control appears to be relatively worse along with her usual fluctuation in glucose levels Recently her home blood sugars are not unusually high and will not make any changes in her regimen [name] is higher than usual However considering her age and the duration of diabetes and propensity to hypoglycemia she is probably reasonably well-controlled Currently her morning blood sugars again trending relatively lower but not consistent despite taking a unit less on her Levemir  since her last visit  She will call if she has unusually low sugars frequently  Hypertension: Her blood pressure is low normal. Will reduce her losartan to half tablet  Jimmy Stipes 11/17/2013, 10:41 AM

## 2013-11-20 DIAGNOSIS — M810 Age-related osteoporosis without current pathological fracture: Secondary | ICD-10-CM | POA: Insufficient documentation

## 2013-12-06 ENCOUNTER — Other Ambulatory Visit: Payer: Self-pay | Admitting: *Deleted

## 2013-12-06 MED ORDER — GLUCOSE BLOOD VI STRP: ORAL_STRIP | Status: DC

## 2013-12-25 ENCOUNTER — Telehealth: Payer: Self-pay | Admitting: Endocrinology

## 2013-12-25 NOTE — Telephone Encounter (Signed)
Left patient a message letting her know there is a book here for her, leaving it at the front counter.

## 2013-12-25 NOTE — Telephone Encounter (Signed)
Pt states she has called last week regarding a diabetic log book  She would like one if possible   Thank You :)  Call [phone]

## 2014-01-05 ENCOUNTER — Other Ambulatory Visit: Payer: Self-pay | Admitting: Endocrinology

## 2014-02-03 ENCOUNTER — Other Ambulatory Visit: Payer: Self-pay | Admitting: Endocrinology

## 2014-02-12 ENCOUNTER — Other Ambulatory Visit (INDEPENDENT_AMBULATORY_CARE_PROVIDER_SITE_OTHER): Payer: Medicare Other

## 2014-02-12 DIAGNOSIS — M810 Age-related osteoporosis without current pathological fracture: Secondary | ICD-10-CM

## 2014-02-12 DIAGNOSIS — IMO0001 Reserved for inherently not codable concepts without codable children: Secondary | ICD-10-CM

## 2014-02-12 DIAGNOSIS — E1165 Type 2 diabetes mellitus with hyperglycemia: Secondary | ICD-10-CM

## 2014-02-12 DIAGNOSIS — E039 Hypothyroidism, unspecified: Secondary | ICD-10-CM

## 2014-02-12 DIAGNOSIS — I10 Essential (primary) hypertension: Secondary | ICD-10-CM

## 2014-02-12 LAB — CBC WITH DIFFERENTIAL/PLATELET
Basophils Absolute: 0 10*3/uL (ref 0.0–0.1)
Basophils Relative: 0.5 % (ref 0.0–3.0)
Eosinophils Absolute: 0.5 10*3/uL (ref 0.0–0.7)
Eosinophils Relative: 6.4 % — ABNORMAL HIGH (ref 0.0–5.0)
HCT: 34.7 % — ABNORMAL LOW (ref 36.0–46.0)
Hemoglobin: 11.6 g/dL — ABNORMAL LOW (ref 12.0–15.0)
Lymphocytes Relative: 27.2 % (ref 12.0–46.0)
Lymphs Abs: 2.2 10*3/uL (ref 0.7–4.0)
MCHC: 33.5 g/dL (ref 30.0–36.0)
MCV: 89.6 fl (ref 78.0–100.0)
Monocytes Absolute: 0.6 10*3/uL (ref 0.1–1.0)
Monocytes Relative: 7.1 % (ref 3.0–12.0)
Neutro Abs: 4.7 10*3/uL (ref 1.4–7.7)
Neutrophils Relative %: 58.8 % (ref 43.0–77.0)
Platelets: 303 10*3/uL (ref 150.0–400.0)
RBC: 3.88 Mil/uL (ref 3.87–5.11)
RDW: 14.2 % (ref 11.5–15.5)
WBC: 8 10*3/uL (ref 4.0–10.5)

## 2014-02-12 LAB — COMPREHENSIVE METABOLIC PANEL
ALT: 11 U/L (ref 0–35)
AST: 18 U/L (ref 0–37)
Albumin: 3.4 g/dL — ABNORMAL LOW (ref 3.5–5.2)
Alkaline Phosphatase: 62 U/L (ref 39–117)
BUN: 14 mg/dL (ref 6–23)
CO2: 26 mEq/L (ref 19–32)
Calcium: 9 mg/dL (ref 8.4–10.5)
Chloride: 104 mEq/L (ref 96–112)
Creatinine, Ser: 0.8 mg/dL (ref 0.4–1.2)
GFR: 77.33 mL/min (ref >60.00)
Glucose, Bld: 97 mg/dL (ref 70–99)
Potassium: 4 mEq/L (ref 3.5–5.1)
Sodium: 137 mEq/L (ref 135–145)
Total Bilirubin: 0.6 mg/dL (ref 0.2–1.2)
Total Protein: 6.5 g/dL (ref 6.0–8.3)

## 2014-02-12 LAB — LIPID PANEL
Cholesterol: 130 mg/dL (ref 0–200)
HDL: 48.9 mg/dL (ref >39.00)
LDL Cholesterol: 60 mg/dL (ref 0–99)
Total CHOL/HDL Ratio: 3
Triglycerides: 104 mg/dL (ref 0.0–149.0)
VLDL: 20.8 mg/dL (ref 0.0–40.0)

## 2014-02-12 LAB — HEMOGLOBIN A1C: Hgb A1c MFr Bld: 8.2 % — ABNORMAL HIGH (ref 4.6–6.5)

## 2014-02-12 LAB — MICROALBUMIN / CREATININE URINE RATIO
Creatinine,U: 14.7 mg/dL
Microalb Creat Ratio: 4.8 mg/g (ref 0.0–30.0)
Microalb, Ur: 0.7 mg/dL (ref 0.0–1.9)

## 2014-02-12 LAB — TSH: TSH: 0.51 u[IU]/mL (ref 0.35–4.50)

## 2014-02-12 LAB — T4, FREE: Free T4: 0.93 ng/dL (ref 0.60–1.60)

## 2014-02-13 LAB — VITAMIN D 25 HYDROXY (VIT D DEFICIENCY, FRACTURES): Vit D, 25-Hydroxy: 48 ng/mL (ref 30–89)

## 2014-02-14 ENCOUNTER — Ambulatory Visit (INDEPENDENT_AMBULATORY_CARE_PROVIDER_SITE_OTHER): Payer: Medicare Other | Admitting: Endocrinology

## 2014-02-14 ENCOUNTER — Encounter: Payer: Self-pay | Admitting: Endocrinology

## 2014-02-14 VITALS — BP 120/52 | HR 70 | Temp 98.0°F | Resp 14 | Ht 59.0 in | Wt 130.4 lb

## 2014-02-14 DIAGNOSIS — I10 Essential (primary) hypertension: Secondary | ICD-10-CM

## 2014-02-14 DIAGNOSIS — E7800 Pure hypercholesterolemia, unspecified: Secondary | ICD-10-CM

## 2014-02-14 DIAGNOSIS — Z23 Encounter for immunization: Secondary | ICD-10-CM

## 2014-02-14 DIAGNOSIS — IMO0001 Reserved for inherently not codable concepts without codable children: Secondary | ICD-10-CM

## 2014-02-14 DIAGNOSIS — E039 Hypothyroidism, unspecified: Secondary | ICD-10-CM

## 2014-02-14 DIAGNOSIS — E1165 Type 2 diabetes mellitus with hyperglycemia: Secondary | ICD-10-CM

## 2014-02-14 DIAGNOSIS — D649 Anemia, unspecified: Secondary | ICD-10-CM

## 2014-02-14 DIAGNOSIS — Z0000 Encounter for general adult medical examination without abnormal findings: Secondary | ICD-10-CM

## 2014-02-14 NOTE — Patient Instructions (Addendum)
Reduce am Levemir to 7 units No extra Novolog unless at a mealtime  Snack before exercise/ heavy yard work  Leave off Fosamax at end of Rx

## 2014-02-14 NOTE — Progress Notes (Signed)
Patient ID: Jennifer Bailey, female   DOB: [birth year], 79 y.o.   MRN: [id number]   Reason for Appointment:  Periodic wellness physical/preventive exam  History of Present Illness   PREVENTIVE CARE:   Annual hemoccults:           09/2012   Breast self exams:                            Yes  Colonoscopy/sigmoidoscopy 2008 Mammograms:  1/15  Yearly flu vaccine:                      yes   Bone Density:   2009, T score -2.3  Calcium supplements:  taking  Tetanus booster:  ?   Zostavax: 2003 Pneumovax: 2005   Diet: Generally low fat Exercise: Yard work, TV exercise programs before breakfast  History of falls: None  Lipid screening, last levels:  Lab Results  Component Value Date   CHOL 130 02/12/2014   HDL 48.90 02/12/2014   LDLCALC 60 02/12/2014   TRIG 104.0 02/12/2014   CHOLHDL 3 02/12/2014     Followup of chronic problems:  Type 2 DIABETES MELITUS, date of diagnosis: 2003  She has been on insulin for several years but continues to have labile blood sugars despite using basal bolus regimen She is very compliant with her insulin at mealtimes and is consistent with the timing of her Levemir insulin twice a day Is taking small amounts of insulin and is not on any oral hypoglycemics, appears to be completely insulin-dependent Her diet is somewhat variable but she is usually having balanced meals and not eating out excessively      RECENT history:  Her [name] is still over 8% although slightly better than the last time However her blood sugars are averaging only 153 at home and she is checking blood sugars 3-4 times a day She does have significant fluctuation in her blood sugars after meals especially breakfast and lunch Current blood sugar trends:  Morning blood sugars are relatively low overall although fluctuating, has not had a low sugars since 5/8  Blood sugars sporadically higher late morning  Blood sugars tend to be low normal again at lunchtime recently with lowest reading  39  After lunch blood sugars are usually fairly good except last Sunday. Highest reading was last month when she had respiratory infection  Blood sugars after supper show a median of 158 with occasional readings over 200 He usually trying to adjust her mealtime dose based on her meal size and blood sugar level but still unexpected high readings     Insulin regimen: Levemir 8 units a.m.--7 units p.m.;  mealtime coverage breakfast: 3-4, lunch and supper 4-5       Proper timing of medications in relation to meals: Yes.          Monitors blood glucose:  up to 4 times a day.    Glucometer: One Touch.          Blood Glucose readings from meter download:   PREMEAL Breakfast Lunch  3-6 PM  Bedtime Overall  Glucose range:  60-216   39-197   60-277   76-275    Mean/median:  120   112    158  138   Hypoglycemia frequency: Occasionally waking up and before lunch and once before supper some of them and some lower readings are after doing a significant amount of yard work Low  sugars are treated with Soft drink or glucose tablets      Meals: 3 meals per day.  Usually has small portions. Eggs or waffle with protein at 7 am; has mid am snack Physical activity: exercise: Less walking recently, doing some tv exercise           Dietician visit: Most recent: [id number]           Complications: are: Mild neuropathy controlled symptomatically  Lab Results  Component Value Date   HGBA1C 8.2* 02/12/2014   HGBA1C 8.5* 11/14/2013   HGBA1C 8.0* 06/06/2013   Lab Results  Component Value Date   MICROALBUR 0.7 02/12/2014   LDLCALC 60 02/12/2014   CREATININE 0.8 02/12/2014    OSTEOPENIA:   She has had osteopenia with T score -2.3 at the hip in 3/09 and was started on Fosamax at that time. No side effects with this and takes it on Fridays. No side effects. No history of fracture  Also subsequently given 50,000 units vitamin D every 2 weeks for vitamin D. deficiency   LABS:  Appointment on 02/12/2014  Component Date  Value Ref Range Status  . WBC 02/12/2014 8.0  4.0 - 10.5 K/uL Final  . RBC 02/12/2014 3.88  3.87 - 5.11 Mil/uL Final  . Hemoglobin 02/12/2014 11.6* 12.0 - 15.0 g/dL Final  . HCT 02/12/2014 34.7* 36.0 - 46.0 % Final  . MCV 02/12/2014 89.6  78.0 - 100.0 fl Final  . MCHC 02/12/2014 33.5  30.0 - 36.0 g/dL Final  . RDW 02/12/2014 14.2  11.5 - 15.5 % Final  . Platelets 02/12/2014 303.0  150.0 - 400.0 K/uL Final  . Neutrophils Relative % 02/12/2014 58.8  43.0 - 77.0 % Final  . Lymphocytes Relative 02/12/2014 27.2  12.0 - 46.0 % Final  . Monocytes Relative 02/12/2014 7.1  3.0 - 12.0 % Final  . Eosinophils Relative 02/12/2014 6.4* 0.0 - 5.0 % Final  . Basophils Relative 02/12/2014 0.5  0.0 - 3.0 % Final  . Neutro Abs 02/12/2014 4.7  1.4 - 7.7 K/uL Final  . Lymphs Abs 02/12/2014 2.2  0.7 - 4.0 K/uL Final  . Monocytes Absolute 02/12/2014 0.6  0.1 - 1.0 K/uL Final  . Eosinophils Absolute 02/12/2014 0.5  0.0 - 0.7 K/uL Final  . Basophils Absolute 02/12/2014 0.0  0.0 - 0.1 K/uL Final  . Sodium 02/12/2014 137  135 - 145 mEq/L Final  . Potassium 02/12/2014 4.0  3.5 - 5.1 mEq/L Final  . Chloride 02/12/2014 104  96 - 112 mEq/L Final  . CO2 02/12/2014 26  19 - 32 mEq/L Final  . Glucose, Bld 02/12/2014 97  70 - 99 mg/dL Final  . BUN 02/12/2014 14  6 - 23 mg/dL Final  . Creatinine, Ser 02/12/2014 0.8  0.4 - 1.2 mg/dL Final  . Total Bilirubin 02/12/2014 0.6  0.2 - 1.2 mg/dL Final  . Alkaline Phosphatase 02/12/2014 62  39 - 117 U/L Final  . AST 02/12/2014 18  0 - 37 U/L Final  . ALT 02/12/2014 11  0 - 35 U/L Final  . Total Protein 02/12/2014 6.5  6.0 - 8.3 g/dL Final  . Albumin 02/12/2014 3.4* 3.5 - 5.2 g/dL Final  . Calcium 02/12/2014 9.0  8.4 - 10.5 mg/dL Final  . GFR 02/12/2014 77.33  >60.00 mL/min Final  . Hemoglobin [name] 02/12/2014 8.2* 4.6 - 6.5 % Final   Glycemic Control Guidelines for People with Diabetes:Non Diabetic:  <6%Goal of Therapy: <7%Additional Action Suggested:  >8%   .  Cholesterol  02/12/2014 130  0 - 200 mg/dL Final   ATP III Classification       Desirable:  < 200 mg/dL               Borderline High:  200 - 239 mg/dL          High:  > = 240 mg/dL  . Triglycerides 02/12/2014 104.0  0.0 - 149.0 mg/dL Final   Normal:  <150 mg/dLBorderline High:  150 - 199 mg/dL  . HDL 02/12/2014 48.90  >39.00 mg/dL Final  . VLDL 02/12/2014 20.8  0.0 - 40.0 mg/dL Final  . LDL Cholesterol 02/12/2014 60  0 - 99 mg/dL Final  . Total CHOL/HDL Ratio 02/12/2014 3   Final                  Men          Women1/2 Average Risk     3.4          3.3Average Risk          5.0          4.42X Average Risk          9.6          7.13X Average Risk          15.0          11.0                      . Free T4 02/12/2014 0.93  0.60 - 1.60 ng/dL Final  . TSH 02/12/2014 0.51  0.35 - 4.50 uIU/mL Final  . Vit D, 25-Hydroxy 02/12/2014 48  30 - 89 ng/mL Final   Comment: This assay accurately quantifies Vitamin D, which is the sum of the                          25-Hydroxy forms of Vitamin D2 and D3.  Studies have shown that the                          optimum concentration of 25-Hydroxy Vitamin D is 30 ng/mL or higher.                           Concentrations of Vitamin D between 20 and 29 ng/mL are considered to                          be insufficient and concentrations less than 20 ng/mL are considered                          to be deficient for Vitamin D.  . Microalb, Ur 02/12/2014 0.7  0.0 - 1.9 mg/dL Final  . Creatinine,U 02/12/2014 14.7   Final  . Microalb Creat Ratio 02/12/2014 4.8  0.0 - 30.0 mg/g Final      Medication List       This list is accurate as of: 02/14/14 10:48 AM.  Always use your most recent med list.               alendronate 70 MG tablet  Commonly known as:  FOSAMAX  TAKE 1 TABLET BY MOUTH ONCE WEEKLY.     aspirin 81 MG tablet  Take 81 mg by mouth daily.  BD PEN NEEDLE NANO U/F 32G X 4 MM Misc  Generic drug:  Insulin Pen Needle     BD PEN NEEDLE NANO U/F 32G X 4 MM Misc   Generic drug:  Insulin Pen Needle  USE AS DIRECTED FIVE TIMES A DAY.     fish oil-omega-3 fatty acids 1000 MG capsule  Take 2 g by mouth daily. Two a day     FLUARIX QUADRIVALENT 0.5 ML injection  Generic drug:  influenza vac split quadrivalent PF     gabapentin 100 MG capsule  Commonly known as:  NEURONTIN  TAKE 3 TO 4 CAPSULES BY MOUTH AT BEDTIME.     glucose blood test strip  Commonly known as:  ONE TOUCH ULTRA TEST  Use as directed to check blood sugars 4 times per day dx code 250.02     insulin aspart 100 UNIT/ML FlexPen  Commonly known as:  NOVOLOG FLEXPEN  INJECT 3 TO 5 UNITS SUBCUTANEOUSLY BEFORE MEALS AS DIRECTED.     Insulin Detemir 100 UNIT/ML Pen  Commonly known as:  LEVEMIR  INJECT 8 UNITS SUBCUTANEOUSLY IN THE MORNING AND 7 UNITS AT NIGHT.     levothyroxine 75 MCG tablet  Commonly known as:  SYNTHROID, LEVOTHROID  TAKE 1 TABLET BY MOUTH DAILY BEFORE BREAKFAST.     losartan 50 MG tablet  Commonly known as:  COZAAR  Take 1 tablet (50 mg total) by mouth daily.     lovastatin 40 MG tablet  Commonly known as:  MEVACOR  40 mg.     omeprazole 20 MG capsule  Commonly known as:  PRILOSEC  TAKE 1 CAPSULE BY MOUTH ONCE DAILY.     PRESERVISION AREDS 2 Caps  Take by mouth.     vitamin C 100 MG tablet  Take 100 mg by mouth daily.     Vitamin D (Ergocalciferol) 50000 UNITS Caps capsule  Commonly known as:  DRISDOL  Take 50,000 Units by mouth every 30 (thirty) days.        Allergies:  Allergies  Allergen Reactions  . Macrodantin [Nitrofurantoin Macrocrystal]   . Penicillins     No past medical history on file.  Past Surgical History  Procedure Laterality Date  . Abdominal hysterectomy      Family History  Problem Relation Age of Onset  . Cancer Mother   . Diabetes Brother     Social History:  reports that she has never smoked. She has never used smokeless tobacco. Her alcohol and drug histories are not on file.  Review of  Systems:  HYPERTENSION:  this has been mild and treated with 25 mg losartan, the dose was reduced on visit in 11/2013 Checking blood pressure at home and these readings are usually low normal systolic but does not feel lightheaded; blood pressure may be higher in the office  HYPOTHYROIDISM: She has had primary hypothyroidism for over 20 years. Her levels have been stable with 75 mcg levothyroxine supplement  Lab Results  Component Value Date   TSH 0.51 02/12/2014    HYPERLIPIDEMIA: The lipid abnormality consists of elevated LDL, currently taking lovastatin with good control.  Lab Results  Component Value Date   CHOL 130 02/12/2014   HDL 48.90 02/12/2014   LDLCALC 60 02/12/2014   TRIG 104.0 02/12/2014   CHOLHDL 3 02/12/2014    She has had a history of burning and tingling in her feet mostly at night controlled with 100 mg gabapentin  No swelling of feet.  No shortness of breath or chest pressure /pain on exertion.     Bowel habits: Occasional loose stools, this is not new. No abdominal pain.  Has had chronic reflux/heartburn treated with Prilosec with good control.      Examination:   BP 120/52  Pulse 70  Temp(Src) 98 F (36.7 C)  Resp 14  Ht $R'4\' 11"'NB$  (1.499 m)  Wt 130 lb 6.4 oz (59.149 kg)  BMI 26.32 kg/m2  SpO2 98%  Body mass index is 26.32 kg/(m^2).   GENERAL: Has a small build  No pallor, clubbing, lymphadenopathy or edema.   Skin:  no rash. Has multiple benign brownish pigmented lesions on back   EYES:  Externally normal.  Fundii:  normal discs and vessels   ENT: Mouth & pharynx normal.  THYROID:  Not palpable.  CAROTIDS:  Normal character; no bruit.  BREAST exam: No mass palpable on either side. Grossly  HEART:  Normal apex, S1 and S2; no murmur or click.  CHEST:  Normal shape.  Lungs:  Vescicular breath sounds heard equally.  No crepitations/ wheeze.  ABDOMEN:  No distention.  Liver and spleen not palpable.  No other mass or tenderness. No abnormal  pulsation in epigastrium  RECTAL exam:  not indicated  NEUROLOGICAL: Vibration sense at toes is mildly reduced.  Reflexes are normal at ankles. Diabetic foot exam done  SPINE AND JOINTS:  minimal osteoarthritis in hands.   ASSESSMENT/ PLAN:   PREVENTIVE CARE:  Discussed with the patient the following issues:  Depression and fall screening  Regular eye and dental exams  Colorectal screening, to have Hemoccult done at home and home testing kit given  Bone density is not due at this time  Colonoscopy not due at this time  Prevnar given today as it is due  Consider doing TDAP on the next visit  Continue regular exercise  Continue breast exams  Reinforced use of seatbelts when driving  Urine microalbumin done today  EKG done, normal  Diabetes type 2   The patient's diabetes control appears to be reasonably good considering the difficulty in getting her blood sugars stable See history of present illness for detailed discussion of current management, blood sugar patterns and problems identified She has rather unpredictable postprandial readings and occasional hypoglycemia  Since she is more active and has low normal readings at lunchtime will reduce her morning Levemir by at least one unit   Also reminded her not to take extra NovoLog unless she is ready for meal   She will try to have a snack or other carbohydrate before doing any significant physical activity   She will call if she has unusually low sugars frequently  Hypertension: Her blood pressure is  well-controlled   Osteopenia without osteoporosis: Since she has taken Fosamax for 6 years we'll stop and followup bone density in 2 years  Hypothyroidism and hypercholesterolemia: Adequately controlled with current regimen  Mild anemia: She will have Hemoccults done and will followup on next visit, to check iron and B12 if needed  Counseling time over 50% of today's 60 minute visit  Elayne Snare 02/14/2014,  10:48 AM

## 2014-02-22 ENCOUNTER — Other Ambulatory Visit: Payer: Self-pay | Admitting: *Deleted

## 2014-02-22 MED ORDER — GLUCOSE BLOOD VI STRP: ORAL_STRIP | Status: DC

## 2014-03-05 ENCOUNTER — Other Ambulatory Visit: Payer: Self-pay | Admitting: *Deleted

## 2014-03-05 ENCOUNTER — Other Ambulatory Visit: Payer: Medicare Other

## 2014-03-05 ENCOUNTER — Other Ambulatory Visit: Payer: Self-pay | Admitting: Endocrinology

## 2014-03-05 DIAGNOSIS — Z1211 Encounter for screening for malignant neoplasm of colon: Secondary | ICD-10-CM

## 2014-03-05 LAB — FECAL OCCULT BLOOD, IMMUNOCHEMICAL: Fecal Occult Bld: POSITIVE — AB

## 2014-05-07 ENCOUNTER — Other Ambulatory Visit: Payer: Self-pay | Admitting: *Deleted

## 2014-05-07 MED ORDER — GABAPENTIN 100 MG PO CAPS: ORAL_CAPSULE | ORAL | Status: DC

## 2014-05-14 ENCOUNTER — Other Ambulatory Visit (INDEPENDENT_AMBULATORY_CARE_PROVIDER_SITE_OTHER): Payer: Medicare Other

## 2014-05-14 DIAGNOSIS — D649 Anemia, unspecified: Secondary | ICD-10-CM

## 2014-05-14 DIAGNOSIS — E1165 Type 2 diabetes mellitus with hyperglycemia: Secondary | ICD-10-CM

## 2014-05-14 DIAGNOSIS — IMO0001 Reserved for inherently not codable concepts without codable children: Secondary | ICD-10-CM

## 2014-05-14 DIAGNOSIS — I10 Essential (primary) hypertension: Secondary | ICD-10-CM

## 2014-05-14 LAB — BASIC METABOLIC PANEL
BUN: 11 mg/dL (ref 6–23)
CO2: 27 mEq/L (ref 19–32)
Calcium: 8.9 mg/dL (ref 8.4–10.5)
Chloride: 104 mEq/L (ref 96–112)
Creatinine, Ser: 0.7 mg/dL (ref 0.4–1.2)
GFR: 86.26 mL/min (ref >60.00)
Glucose, Bld: 150 mg/dL — ABNORMAL HIGH (ref 70–99)
Potassium: 3.9 mEq/L (ref 3.5–5.1)
Sodium: 136 mEq/L (ref 135–145)

## 2014-05-14 LAB — CBC
HCT: 35.1 % — ABNORMAL LOW (ref 36.0–46.0)
Hemoglobin: 11.8 g/dL — ABNORMAL LOW (ref 12.0–15.0)
MCHC: 33.6 g/dL (ref 30.0–36.0)
MCV: 89.7 fl (ref 78.0–100.0)
Platelets: 250 10*3/uL (ref 150.0–400.0)
RBC: 3.92 Mil/uL (ref 3.87–5.11)
RDW: 14.7 % (ref 11.5–15.5)
WBC: 7.7 10*3/uL (ref 4.0–10.5)

## 2014-05-14 LAB — IBC PANEL
Iron: 62 ug/dL (ref 42–145)
Saturation Ratios: 22.7 % (ref 20.0–50.0)
Transferrin: 195.2 mg/dL — ABNORMAL LOW (ref 212.0–360.0)

## 2014-05-14 LAB — HEMOGLOBIN A1C: Hgb A1c MFr Bld: 8.4 % — ABNORMAL HIGH (ref 4.6–6.5)

## 2014-05-18 ENCOUNTER — Ambulatory Visit (INDEPENDENT_AMBULATORY_CARE_PROVIDER_SITE_OTHER): Payer: Medicare Other | Admitting: Endocrinology

## 2014-05-18 ENCOUNTER — Other Ambulatory Visit: Payer: Self-pay | Admitting: *Deleted

## 2014-05-18 ENCOUNTER — Encounter: Payer: Self-pay | Admitting: Endocrinology

## 2014-05-18 VITALS — BP 114/42 | HR 69 | Temp 98.4°F | Resp 14 | Ht 59.0 in | Wt 129.4 lb

## 2014-05-18 DIAGNOSIS — E1142 Type 2 diabetes mellitus with diabetic polyneuropathy: Secondary | ICD-10-CM

## 2014-05-18 DIAGNOSIS — E1165 Type 2 diabetes mellitus with hyperglycemia: Principal | ICD-10-CM

## 2014-05-18 DIAGNOSIS — IMO0001 Reserved for inherently not codable concepts without codable children: Secondary | ICD-10-CM

## 2014-05-18 DIAGNOSIS — D638 Anemia in other chronic diseases classified elsewhere: Secondary | ICD-10-CM

## 2014-05-18 DIAGNOSIS — I10 Essential (primary) hypertension: Secondary | ICD-10-CM

## 2014-05-18 DIAGNOSIS — E039 Hypothyroidism, unspecified: Secondary | ICD-10-CM

## 2014-05-18 MED ORDER — INSULIN ASPART 100 UNIT/ML FLEXPEN: PEN_INJECTOR | SUBCUTANEOUS | Status: DC

## 2014-05-18 NOTE — Progress Notes (Signed)
Patient ID: Jennifer Bailey, female   DOB: 05/01/1935, 79 y.o.   MRN: [id number]   Reason for Appointment:  Periodic followup   History of Present Illness   PREVENTIVE CARE:   Type 2 DIABETES MELITUS, date of diagnosis: 2003  She has been on insulin for several years but continues to have labile blood sugars despite using basal bolus regimen She is very compliant with her insulin at mealtimes and is consistent with the timing of her Levemir insulin twice a day Is taking small amounts of insulin and is not on any oral hypoglycemics, appears to be completely insulin-dependent Her diet is somewhat variable but she is usually having balanced meals and not eating out excessively      RECENT history:  Her [name] is still over 8%  Control is somewhat difficult because of significant fluctuation in blood sugars and occasional hypoglycemia On her last visit her morning ligament was reduced by one unit because of low-normal suppertime readings Blood sugars are averaging overall higher than the previous visit and median was 153 and it is now 194 last 2 weeks Current blood sugar trends:  Morning blood sugars are fluctuating markedly but on an average moderately high  Blood sugars are generally better midday  Blood sugars are again tending to be higher at suppertime but more recently improved and has not checked as much  At bedtime glucose reading is overall high but she also has a snack before testing her glucose He usually trying to adjust her mealtime dose based on her meal size and blood sugar level but still unexpected high readings     Insulin regimen: Levemir 7 units a.m.--7 units p.m.;  mealtime coverage breakfast: 3-4, lunch and supper 4-5       Proper timing of medications in relation to meals: Yes.          Monitors blood glucose:  up to 4 times a day.    Glucometer: One Touch.          Blood Glucose readings from meter download:    PREMEAL Breakfast Lunch Dinner Bedtime Overall   Glucose range:  65-314   63-225   113-335   114-318    Mean/median:  165     213   195    Hypoglycemia frequency: Occasionally waking up and before lunch but recently has only 2 readings in the 60s Low sugars are treated with Soft drink or glucose tablets      Meals: 3 meals per day.  Usually has small portions. Eggs or waffle with protein at 7 am; has mid am snack Physical activity: exercise: Less walking recently, doing some tv exercise           Dietician visit: Most recent: 7616           Complications: are: Mild neuropathy controlled symptomatically  Lab Results  Component Value Date   HGBA1C 8.4* 05/14/2014   HGBA1C 8.2* 02/12/2014   HGBA1C 8.5* 11/14/2013   Lab Results  Component Value Date   MICROALBUR 0.7 02/12/2014   LDLCALC 60 02/12/2014   CREATININE 0.7 05/14/2014      LABS:  Appointment on 05/14/2014  Component Date Value Ref Range Status  . WBC 05/14/2014 7.7  4.0 - 10.5 K/uL Final  . RBC 05/14/2014 3.92  3.87 - 5.11 Mil/uL Final  . Platelets 05/14/2014 250.0  150.0 - 400.0 K/uL Final  . Hemoglobin 05/14/2014 11.8* 12.0 - 15.0 g/dL Final  . HCT 05/14/2014 35.1* 36.0 - 46.0 %  Final  . MCV 05/14/2014 89.7  78.0 - 100.0 fl Final  . MCHC 05/14/2014 33.6  30.0 - 36.0 g/dL Final  . RDW 05/14/2014 14.7  11.5 - 15.5 % Final  . Hemoglobin [name] 05/14/2014 8.4* 4.6 - 6.5 % Final   Glycemic Control Guidelines for People with Diabetes:Non Diabetic:  <6%Goal of Therapy: <7%Additional Action Suggested:  >8%   . Sodium 05/14/2014 136  135 - 145 mEq/L Final  . Potassium 05/14/2014 3.9  3.5 - 5.1 mEq/L Final  . Chloride 05/14/2014 104  96 - 112 mEq/L Final  . CO2 05/14/2014 27  19 - 32 mEq/L Final  . Glucose, Bld 05/14/2014 150* 70 - 99 mg/dL Final  . BUN 05/14/2014 11  6 - 23 mg/dL Final  . Creatinine, Ser 05/14/2014 0.7  0.4 - 1.2 mg/dL Final  . Calcium 05/14/2014 8.9  8.4 - 10.5 mg/dL Final  . GFR 05/14/2014 86.26  >60.00 mL/min Final  . Iron 05/14/2014 62  42 - 145 ug/dL  Final  . Transferrin 05/14/2014 195.2* 212.0 - 360.0 mg/dL Final  . Saturation Ratios 05/14/2014 22.7  20.0 - 50.0 % Final      Medication List       This list is accurate as of: 05/18/14 10:43 AM.  Always use your most recent med list.               alendronate 70 MG tablet  Commonly known as:  FOSAMAX  TAKE 1 TABLET BY MOUTH ONCE WEEKLY.     aspirin 81 MG tablet  Take 81 mg by mouth daily.     BD PEN NEEDLE NANO U/F 32G X 4 MM Misc  Generic drug:  Insulin Pen Needle     BD PEN NEEDLE NANO U/F 32G X 4 MM Misc  Generic drug:  Insulin Pen Needle  USE AS DIRECTED FIVE TIMES A DAY.     fish oil-omega-3 fatty acids 1000 MG capsule  Take 2 g by mouth daily. Two a day     FLUARIX QUADRIVALENT 0.5 ML injection  Generic drug:  influenza vac split quadrivalent PF     gabapentin 100 MG capsule  Commonly known as:  NEURONTIN  TAKE 3 TO 4 CAPSULES BY MOUTH AT BEDTIME.     glucose blood test strip  Commonly known as:  ONE TOUCH ULTRA TEST  Use as directed to check blood sugars 4 times per day dx code 250.02     insulin aspart 100 UNIT/ML FlexPen  Commonly known as:  NOVOLOG FLEXPEN  INJECT 3 TO 5 UNITS SUBCUTANEOUSLY BEFORE MEALS AS DIRECTED.     Insulin Detemir 100 UNIT/ML Pen  Commonly known as:  LEVEMIR  7 Units 2 (two) times daily.     levothyroxine 75 MCG tablet  Commonly known as:  SYNTHROID, LEVOTHROID  TAKE 1 TABLET BY MOUTH DAILY BEFORE BREAKFAST.     losartan 50 MG tablet  Commonly known as:  COZAAR  Take 1 tablet (50 mg total) by mouth daily.     lovastatin 40 MG tablet  Commonly known as:  MEVACOR  TAKE 1 TABLET BY MOUTH ONCE DAILY WITH A MEAL.     omeprazole 20 MG capsule  Commonly known as:  PRILOSEC  TAKE 1 CAPSULE BY MOUTH ONCE DAILY.     PRESERVISION AREDS 2 Caps  Take by mouth.     vitamin C 100 MG tablet  Take 100 mg by mouth daily.     Vitamin D (Ergocalciferol) 50000 UNITS  Caps capsule  Commonly known as:  DRISDOL  Take 50,000 Units by  mouth every 30 (thirty) days.        Allergies:  Allergies  Allergen Reactions  . Macrodantin [Nitrofurantoin Macrocrystal]   . Penicillins     No past medical history on file.  Past Surgical History  Procedure Laterality Date  . Abdominal hysterectomy      Family History  Problem Relation Age of Onset  . Cancer Mother   . Diabetes Brother     Social History:  reports that she has never smoked. She has never used smokeless tobacco. Her alcohol and drug histories are not on file.  Review of Systems:  She had a positive Hemoccult but colonoscopy showed only hemorrhoids   OSTEOPENIA:  She has had osteopenia with T score -2.3 at the hip in 3/09 and was started on Fosamax at that time.  This was stopped in 5/15. No history of fracture Has been given 50,000 units vitamin D every 2 weeks for vitamin D. deficiency  HYPERTENSION:  this has been mild and treated with 25 mg losartan, the dose was reduced on visit in 11/2013 Home BP 109-140 Checking blood pressure at home and these readings are usually low normal systolic but does not feel lightheaded; blood pressure may be higher in the office  HYPOTHYROIDISM: She has had primary hypothyroidism for over 20 years. Her levels have been stable with 75 mcg levothyroxine supplement  Lab Results  Component Value Date   TSH 0.51 02/12/2014    HYPERLIPIDEMIA: The lipid abnormality consists of elevated LDL, currently taking lovastatin with good control.  Lab Results  Component Value Date   CHOL 130 02/12/2014   HDL 48.90 02/12/2014   LDLCALC 60 02/12/2014   TRIG 104.0 02/12/2014   CHOLHDL 3 02/12/2014         Examination:   BP 114/42  Pulse 69  Temp(Src) 98.4 F (36.9 C)  Resp 14  Ht 4\' 11"  (1.499 m)  Wt 129 lb 6.4 oz (58.695 kg)  BMI 26.12 kg/m2  SpO2 97%  Body mass index is 26.12 kg/(m^2).   No ankle edema   ASSESSMENT/ PLAN:   Diabetes type 2   The patient's diabetes control appears to be recently worse with  overall high readings especially in the evenings Again has difficulty in getting her blood sugars stable See history of present illness for detailed discussion of current management, blood sugar patterns and problems identified She has rather variable readings at all times especially postprandial readings and occasional hypoglycemia Since blood sugars are mostly higher after supper she needs at least 1-2 units more to cover the evening Also she can check her post prandial readings in the evening after supper rather than after a snack Again discussed  having balanced meals with protein Also did have a snack before doing any significant exercise Consider increasing her lunchtime dose again if blood sugars are higher after lunch  Hypertension: Her blood pressure is  well-controlled   Hypothyroidism and hypercholesterolemia: Adequately controlled with current regimen  Anemia: This is slightly better and and saturation is normal. Will continue to monitor  Counseling time over 50% of today's 25 minute visit  Cleora Karnik 05/18/2014, 10:43 AM

## 2014-05-18 NOTE — Patient Instructions (Signed)
Supper Novolog 6-7 units; keep sugar after supper at least Under 180

## 2014-06-05 ENCOUNTER — Other Ambulatory Visit: Payer: Self-pay | Admitting: Endocrinology

## 2014-07-07 ENCOUNTER — Other Ambulatory Visit: Payer: Self-pay | Admitting: Endocrinology

## 2014-07-24 ENCOUNTER — Other Ambulatory Visit: Payer: Self-pay | Admitting: *Deleted

## 2014-07-24 DIAGNOSIS — IMO0002 Reserved for concepts with insufficient information to code with codable children: Secondary | ICD-10-CM

## 2014-07-24 DIAGNOSIS — E1165 Type 2 diabetes mellitus with hyperglycemia: Secondary | ICD-10-CM

## 2014-07-25 ENCOUNTER — Other Ambulatory Visit (INDEPENDENT_AMBULATORY_CARE_PROVIDER_SITE_OTHER): Payer: Medicare Other

## 2014-07-25 DIAGNOSIS — E1165 Type 2 diabetes mellitus with hyperglycemia: Secondary | ICD-10-CM

## 2014-07-25 LAB — URINALYSIS, ROUTINE W REFLEX MICROSCOPIC
Bilirubin Urine: NEGATIVE
Ketones, ur: NEGATIVE
Nitrite: NEGATIVE
Specific Gravity, Urine: 1.025 (ref 1.000–1.030)
Urine Glucose: NEGATIVE
Urobilinogen, UA: 0.2 (ref 0.0–1.0)
pH: 6 (ref 5.0–8.0)

## 2014-07-27 ENCOUNTER — Other Ambulatory Visit: Payer: Self-pay | Admitting: *Deleted

## 2014-07-27 ENCOUNTER — Telehealth: Payer: Self-pay | Admitting: Endocrinology

## 2014-07-27 MED ORDER — SULFAMETHOXAZOLE-TMP DS 800-160 MG PO TABS: 1.0000 | ORAL_TABLET | Freq: Two times a day (BID) | ORAL | Status: DC

## 2014-07-27 NOTE — Telephone Encounter (Signed)
She said she's having burning wiit with urination, itching and just feels blah.

## 2014-07-27 NOTE — Telephone Encounter (Signed)
She has a very low-grade infection, what symptoms is she having?

## 2014-07-27 NOTE — Telephone Encounter (Signed)
Calling about her urine test she was wondering what the results were and if she needs medication for that if you could contact her in regards to this

## 2014-07-27 NOTE — Telephone Encounter (Signed)
Noted, patient is aware, rx sent 

## 2014-07-27 NOTE — Telephone Encounter (Signed)
Please see below and advise.

## 2014-07-27 NOTE — Telephone Encounter (Signed)
She can try Septra DS twice a day for 5 days and increased fluid intake

## 2014-08-15 ENCOUNTER — Other Ambulatory Visit (INDEPENDENT_AMBULATORY_CARE_PROVIDER_SITE_OTHER): Payer: Medicare Other

## 2014-08-15 ENCOUNTER — Other Ambulatory Visit: Payer: Medicare Other

## 2014-08-15 DIAGNOSIS — E1165 Type 2 diabetes mellitus with hyperglycemia: Secondary | ICD-10-CM

## 2014-08-15 DIAGNOSIS — D638 Anemia in other chronic diseases classified elsewhere: Secondary | ICD-10-CM

## 2014-08-15 DIAGNOSIS — E039 Hypothyroidism, unspecified: Secondary | ICD-10-CM

## 2014-08-15 DIAGNOSIS — IMO0002 Reserved for concepts with insufficient information to code with codable children: Secondary | ICD-10-CM

## 2014-08-15 LAB — CBC
HCT: 37.5 % (ref 36.0–46.0)
Hemoglobin: 12.4 g/dL (ref 12.0–15.0)
MCHC: 33 g/dL (ref 30.0–36.0)
MCV: 90.6 fl (ref 78.0–100.0)
Platelets: 281 10*3/uL (ref 150.0–400.0)
RBC: 4.14 Mil/uL (ref 3.87–5.11)
RDW: 14.2 % (ref 11.5–15.5)
WBC: 9.3 10*3/uL (ref 4.0–10.5)

## 2014-08-15 LAB — COMPREHENSIVE METABOLIC PANEL
ALT: 13 U/L (ref 0–35)
AST: 19 U/L (ref 0–37)
Albumin: 3.5 g/dL (ref 3.5–5.2)
Alkaline Phosphatase: 73 U/L (ref 39–117)
BUN: 16 mg/dL (ref 6–23)
CO2: 26 mEq/L (ref 19–32)
Calcium: 9.5 mg/dL (ref 8.4–10.5)
Chloride: 103 mEq/L (ref 96–112)
Creatinine, Ser: 0.8 mg/dL (ref 0.4–1.2)
GFR: 72.84 mL/min (ref >60.00)
Glucose, Bld: 156 mg/dL — ABNORMAL HIGH (ref 70–99)
Potassium: 3.8 mEq/L (ref 3.5–5.1)
Sodium: 138 mEq/L (ref 135–145)
Total Bilirubin: 0.5 mg/dL (ref 0.2–1.2)
Total Protein: 6.9 g/dL (ref 6.0–8.3)

## 2014-08-15 LAB — TSH: TSH: 4.88 u[IU]/mL — ABNORMAL HIGH (ref 0.35–4.50)

## 2014-08-15 LAB — HEMOGLOBIN A1C: Hgb A1c MFr Bld: 7.9 % — ABNORMAL HIGH (ref 4.6–6.5)

## 2014-08-20 ENCOUNTER — Telehealth: Payer: Self-pay | Admitting: Endocrinology

## 2014-08-20 ENCOUNTER — Ambulatory Visit (INDEPENDENT_AMBULATORY_CARE_PROVIDER_SITE_OTHER): Payer: Medicare Other | Admitting: Endocrinology

## 2014-08-20 ENCOUNTER — Encounter: Payer: Self-pay | Admitting: Endocrinology

## 2014-08-20 VITALS — BP 144/72 | HR 72 | Temp 97.9°F | Resp 16 | Ht 59.0 in | Wt 131.8 lb

## 2014-08-20 DIAGNOSIS — IMO0002 Reserved for concepts with insufficient information to code with codable children: Secondary | ICD-10-CM | POA: Insufficient documentation

## 2014-08-20 DIAGNOSIS — E038 Other specified hypothyroidism: Secondary | ICD-10-CM

## 2014-08-20 DIAGNOSIS — E063 Autoimmune thyroiditis: Secondary | ICD-10-CM

## 2014-08-20 DIAGNOSIS — D638 Anemia in other chronic diseases classified elsewhere: Secondary | ICD-10-CM

## 2014-08-20 DIAGNOSIS — E1165 Type 2 diabetes mellitus with hyperglycemia: Secondary | ICD-10-CM | POA: Insufficient documentation

## 2014-08-20 DIAGNOSIS — I10 Essential (primary) hypertension: Secondary | ICD-10-CM

## 2014-08-20 NOTE — Telephone Encounter (Signed)
Patient will like to know if adjustment on medication is correct. Please advise

## 2014-08-20 NOTE — Patient Instructions (Signed)
Bring BP monitor to office  Extra 1/2 Thyroid pill once a week

## 2014-08-20 NOTE — Progress Notes (Signed)
Patient ID: Jennifer Bailey, female   DOB: Oct 02, 1935, 79 y.o.   MRN: [id number]   Reason for Appointment:  followup   History of Present Illness   PREVENTIVE CARE:   Type 2 DIABETES MELITUS, date of diagnosis: 2003  She has been on insulin for several years but continues to have labile blood sugars despite using basal bolus regimen She is very compliant with her insulin at mealtimes and is consistent with the timing of her Levemir insulin twice a day Is taking small amounts of insulin and is not on any oral hypoglycemics, appears to be completely insulin-dependent Her diet is somewhat variable but she is usually having balanced meals and not eating out excessively      RECENT history:  Her [name] is slightly below 8% compared to previous levels of over 8% She appears to have less fluctuation of her blood sugars without any real change in her insulin regimen or lifestyle changes Control is usually difficult because of significant fluctuation in blood sugars and occasional hypoglycemia On her last visit her insulin was continued unchanged Blood sugars are averaging overall higher than the previous visit and median was 153 and it is now 194 last 2 weeks Current blood sugar trends:  Overall her highest blood sugars are after breakfast or lunch   Morning blood sugars are fluctuating markedly for no apparent reason with some readings over 300; more recent blood sugars have not been as high  Blood sugars are variable midday but not checking very much  Blood sugars are occasionally higher at suppertime but still fluctuating some  At bedtime glucose reading is overall relatively better with median only 110;  May have a snack before testing her glucose Again is trying to adjust her mealtime dose based on her meal size and blood sugar level       Insulin regimen: Levemir 7 units a.m.--7 units hs;  mealtime coverage breakfast: 3-4, 4-5  lunch and supper       Proper timing of medications in  relation to meals: Yes.          Monitors blood glucose:  up to 4 times a day.    Glucometer: One Touch.          Blood Glucose readings from meter download:    PREMEAL Breakfast Lunch Dinner Bedtime Overall  Glucose range: 99-356 116-217 93-232 70-220 69-356  Mean/median: 158    165   POST-MEAL PC Breakfast PC Lunch PC Dinner  Glucose range:   82-175  Mean/median:       Hypoglycemia: None recently, lowest glucose 69 today after breakfast Low sugars are treated with Soft drink or glucose tablets      Meals: 3 meals per day.  Usually has small portions. Eggs or waffle with protein at 7 am; has mid am snack Physical activity: exercise: walking 2/7 recently             Dietician visit: Most recent: 2004            Wt Readings from Last 3 Encounters:  08/20/14 131 lb 12.8 oz (59.784 kg)  05/18/14 129 lb 6.4 oz (58.695 kg)  02/14/14 130 lb 6.4 oz (32.355 kg)   Complications: are: Mild neuropathy controlled symptomatically  Lab Results  Component Value Date   HGBA1C 7.9* 08/15/2014   HGBA1C 8.4* 05/14/2014   HGBA1C 8.2* 02/12/2014   Lab Results  Component Value Date   MICROALBUR 0.7 02/12/2014   LDLCALC 60 02/12/2014   CREATININE 0.8  08/15/2014      LABS:  Appointment on 08/15/2014  Component Date Value Ref Range Status  . Hgb [name] MFr Bld 08/15/2014 7.9* 4.6 - 6.5 % Final   Glycemic Control Guidelines for People with Diabetes:Non Diabetic:  <6%Goal of Therapy: <7%Additional Action Suggested:  >8%   . Sodium 08/15/2014 138  135 - 145 mEq/L Final  . Potassium 08/15/2014 3.8  3.5 - 5.1 mEq/L Final  . Chloride 08/15/2014 103  96 - 112 mEq/L Final  . CO2 08/15/2014 26  19 - 32 mEq/L Final  . Glucose, Bld 08/15/2014 156* 70 - 99 mg/dL Final  . BUN 08/15/2014 16  6 - 23 mg/dL Final  . Creatinine, Ser 08/15/2014 0.8  0.4 - 1.2 mg/dL Final  . Total Bilirubin 08/15/2014 0.5  0.2 - 1.2 mg/dL Final  . Alkaline Phosphatase 08/15/2014 73  39 - 117 U/L Final  . AST 08/15/2014 19   0 - 37 U/L Final  . ALT 08/15/2014 13  0 - 35 U/L Final  . Total Protein 08/15/2014 6.9  6.0 - 8.3 g/dL Final  . Albumin 08/15/2014 3.5  3.5 - 5.2 g/dL Final  . Calcium 08/15/2014 9.5  8.4 - 10.5 mg/dL Final  . GFR 08/15/2014 72.84  >60.00 mL/min Final  . WBC 08/15/2014 9.3  4.0 - 10.5 K/uL Final  . RBC 08/15/2014 4.14  3.87 - 5.11 Mil/uL Final  . Platelets 08/15/2014 281.0  150.0 - 400.0 K/uL Final  . Hemoglobin 08/15/2014 12.4  12.0 - 15.0 g/dL Final  . HCT 08/15/2014 37.5  36.0 - 46.0 % Final  . MCV 08/15/2014 90.6  78.0 - 100.0 fl Final  . MCHC 08/15/2014 33.0  30.0 - 36.0 g/dL Final  . RDW 08/15/2014 14.2  11.5 - 15.5 % Final  . TSH 08/15/2014 4.88* 0.35 - 4.50 uIU/mL Final      Medication List       This list is accurate as of: 08/20/14 10:33 AM.  Always use your most recent med list.               aspirin 81 MG tablet  Take 81 mg by mouth daily.     BD PEN NEEDLE NANO U/F 32G X 4 MM Misc  Generic drug:  Insulin Pen Needle     BD PEN NEEDLE NANO U/F 32G X 4 MM Misc  Generic drug:  Insulin Pen Needle  USE AS DIRECTED FIVE TIMES A DAY.     fish oil-omega-3 fatty acids 1000 MG capsule  Take 2 g by mouth daily. Two a day     FLUARIX QUADRIVALENT 0.5 ML injection  Generic drug:  influenza vac split quadrivalent PF     gabapentin 100 MG capsule  Commonly known as:  NEURONTIN  TAKE 3 TO 4 CAPSULES BY MOUTH AT BEDTIME.     glucose blood test strip  Commonly known as:  ONE TOUCH ULTRA TEST  Use as directed to check blood sugars 4 times per day dx code 250.02     insulin aspart 100 UNIT/ML FlexPen  Commonly known as:  NOVOLOG FLEXPEN  INJECT 3 TO 5 UNITS SUBCUTANEOUSLY BEFORE MEALS AS DIRECTED.     LEVEMIR FLEXTOUCH 100 UNIT/ML Pen  Generic drug:  Insulin Detemir  INJECT 8 UNITS SUBCUTANEOUSLY IN THE MORNING AND 6 UNITS AT NIGHT.     levothyroxine 75 MCG tablet  Commonly known as:  SYNTHROID, LEVOTHROID  TAKE 1 TABLET BY MOUTH DAILY BEFORE BREAKFAST.  losartan 50 MG tablet  Commonly known as:  COZAAR  TAKE 1 TABLET BY MOUTH DAILY.     lovastatin 40 MG tablet  Commonly known as:  MEVACOR  TAKE 1 TABLET BY MOUTH ONCE DAILY WITH A MEAL.     omeprazole 20 MG capsule  Commonly known as:  PRILOSEC  TAKE 1 CAPSULE BY MOUTH ONCE DAILY.     PRESERVISION AREDS 2 Caps  Take by mouth.     vitamin C 100 MG tablet  Take 100 mg by mouth daily.     Vitamin D (Ergocalciferol) 50000 UNITS Caps capsule  Commonly known as:  DRISDOL  TAKE 1 CAPSULE BY MOUTH ONCE A MONTH.        Allergies:  Allergies  Allergen Reactions  . Macrodantin [Nitrofurantoin Macrocrystal]   . Penicillins     No past medical history on file.  Past Surgical History  Procedure Laterality Date  . Abdominal hysterectomy      Family History  Problem Relation Age of Onset  . Cancer Mother   . Diabetes Brother     Social History:  reports that she has never smoked. She has never used smokeless tobacco. Her alcohol and drug histories are not on file.  Review of Systems:  She had a positive Hemoccult but colonoscopy showed only hemorrhoids Not on iron and hemoglobin is normal  OSTEOPENIA:  She has had osteopenia with T score -2.3 at the hip in 3/09 and was started on Fosamax at that time.  This was stopped in 5/15. No history of fracture Has been given 50,000 units vitamin D every 2 weeks for vitamin D. deficiency  HYPERTENSION:  this has been mild and treated with 50 mg losartan  Home BP 578-469 systolic, usually not lightheaded Has not brought her monitor for comparison to the office; blood pressure may be higher in the office  HYPOTHYROIDISM: She has had primary hypothyroidism for over 20 years. Her levels have been stable with 75 mcg levothyroxine supplement but now relatively high Does not complain of unusual fatigue  Lab Results  Component Value Date   FREET4 0.93 02/12/2014   FREET4 1.07 08/07/2013   TSH 4.88* 08/15/2014   TSH 0.51 02/12/2014    TSH 1.13 08/07/2013   NEUROPATHY: She has mild stable symptoms and usually relieved by gabapentin  She does have some insomnia which is unrelated at times  HYPERLIPIDEMIA: The lipid abnormality consists of elevated LDL, currently taking lovastatin with good control.  Lab Results  Component Value Date   CHOL 130 02/12/2014   HDL 48.90 02/12/2014   LDLCALC 60 02/12/2014   TRIG 104.0 02/12/2014   CHOLHDL 3 02/12/2014         Examination:   BP 144/72 mmHg  Pulse 72  Temp(Src) 97.9 F (36.6 C)  Resp 16  Ht 4\' 11"  (1.499 m)  Wt 131 lb 12.8 oz (59.784 kg)  BMI 26.61 kg/m2  SpO2 98%  Body mass index is 26.61 kg/(m^2).   No ankle edema  146/74 standing  Biceps reflexes normal  ASSESSMENT/ PLAN:   Diabetes type 2   The patient's diabetes control appears to be recently better with less fluctuation in her blood sugars Also appears to have a slightly lower average reading at home along with slightly improved [name] below 8% now Again has difficulty in getting her blood sugars consistent especially fasting See history of present illness for detailed discussion of current management, blood sugar patterns and problems identified Not able to explain her  markedly increased increased fasting readings at times She is very compliant with doing her insulin, following a good diet and trying to exercise as much as possible Although she may be a candidate for Toujeo it may not be covered by her insurance and may require significant adjustment initially She will continue her regimen unchanged and try to adjust mealtime dose based on carbohydrate intake She will call if fasting blood sugars are consistently high  Hypertension: Her blood pressure is  well-controlled   Hypothyroidism : Not clear why her TSH is relatively higher, she is compliant with her medication; currently asymptomatic She will increase her dose by half tablet a week for now  Mild anemia: Improved  Counseling time  over 50% of today's 25 minute visit  Tel Hevia 08/20/2014, 10:33 AM

## 2014-10-06 ENCOUNTER — Other Ambulatory Visit: Payer: Self-pay | Admitting: Endocrinology

## 2014-10-10 ENCOUNTER — Telehealth: Payer: Self-pay | Admitting: Endocrinology

## 2014-10-10 NOTE — Telephone Encounter (Signed)
Is the sugar high at bedtime daily?  If it is than she needs to take at least extra unit of NovoLog at suppertime Increase evening Levemir to 9 units  Please order mammogram.  She is not due for bone density as yet since she just stopped the medication in 5/15

## 2014-10-10 NOTE — Telephone Encounter (Signed)
Pt states we recommended mammogram and bone density she would like to get these done at the same time if possible. The pt's sugar has also been running high.

## 2014-10-10 NOTE — Telephone Encounter (Signed)
Patient said her sugars are not high consistently, one night it will be high and then next would be fine.

## 2014-10-10 NOTE — Telephone Encounter (Signed)
Patient states her visiting nurse from her health insurance company came out and recommends patient gets a bone density and mammogram she is due next month,  Her sugar has been high in am, lastt night before she went to bed it was 81, when she woke up it was 303.  Another night it was 263, when she woke up it was 256.  She's not sure if she's getting enough insulin,  Last night it was 234 and she took 8 units of levemir and it went up to 363.

## 2014-10-11 ENCOUNTER — Other Ambulatory Visit: Payer: Self-pay

## 2014-10-11 DIAGNOSIS — Z1231 Encounter for screening mammogram for malignant neoplasm of breast: Secondary | ICD-10-CM

## 2014-11-14 ENCOUNTER — Other Ambulatory Visit: Payer: Self-pay | Admitting: *Deleted

## 2014-11-14 MED ORDER — GLUCOSE BLOOD VI STRP: ORAL_STRIP | Status: DC

## 2014-11-14 MED ORDER — ALCOHOL PADS 70 % PADS: MEDICATED_PAD | Status: DC

## 2014-11-14 MED ORDER — INSULIN PEN NEEDLE 32G X 4 MM MISC: Status: DC

## 2014-11-16 ENCOUNTER — Other Ambulatory Visit: Payer: Medicare Other

## 2014-11-16 ENCOUNTER — Ambulatory Visit
Admission: RE | Admit: 2014-11-16 | Discharge: 2014-11-16 | Disposition: A | Discharge status: Home / Self Care | Payer: Medicare Other | Source: Ambulatory Visit

## 2014-11-16 DIAGNOSIS — Z1231 Encounter for screening mammogram for malignant neoplasm of breast: Secondary | ICD-10-CM

## 2014-11-21 ENCOUNTER — Ambulatory Visit: Payer: Medicare Other | Admitting: Endocrinology

## 2014-11-21 ENCOUNTER — Telehealth: Payer: Self-pay | Admitting: Endocrinology

## 2014-11-21 NOTE — Telephone Encounter (Signed)
Pt needs rx for test strips she tests 4 times daily--let the pt know Suanne Marker called into [facility] on 2/10 she is going to contact the pharmacy

## 2014-12-03 ENCOUNTER — Other Ambulatory Visit (INDEPENDENT_AMBULATORY_CARE_PROVIDER_SITE_OTHER): Payer: Medicare Other

## 2014-12-03 ENCOUNTER — Telehealth: Payer: Self-pay | Admitting: Endocrinology

## 2014-12-03 DIAGNOSIS — IMO0002 Reserved for concepts with insufficient information to code with codable children: Secondary | ICD-10-CM

## 2014-12-03 DIAGNOSIS — E1165 Type 2 diabetes mellitus with hyperglycemia: Secondary | ICD-10-CM

## 2014-12-03 LAB — LIPID PANEL
Cholesterol: 132 mg/dL (ref 0–200)
HDL: 48.3 mg/dL (ref >39.00)
LDL Cholesterol: 57 mg/dL (ref 0–99)
NonHDL: 83.7
Total CHOL/HDL Ratio: 3
Triglycerides: 133 mg/dL (ref 0.0–149.0)
VLDL: 26.6 mg/dL (ref 0.0–40.0)

## 2014-12-03 LAB — COMPREHENSIVE METABOLIC PANEL
ALT: 14 U/L (ref 0–35)
AST: 20 U/L (ref 0–37)
Albumin: 4 g/dL (ref 3.5–5.2)
Alkaline Phosphatase: 81 U/L (ref 39–117)
BUN: 13 mg/dL (ref 6–23)
CO2: 29 mEq/L (ref 19–32)
Calcium: 9.4 mg/dL (ref 8.4–10.5)
Chloride: 106 mEq/L (ref 96–112)
Creatinine, Ser: 0.76 mg/dL (ref 0.40–1.20)
GFR: 78.34 mL/min (ref >60.00)
Glucose, Bld: 113 mg/dL — ABNORMAL HIGH (ref 70–99)
Potassium: 3.8 mEq/L (ref 3.5–5.1)
Sodium: 140 mEq/L (ref 135–145)
Total Bilirubin: 0.3 mg/dL (ref 0.2–1.2)
Total Protein: 6.9 g/dL (ref 6.0–8.3)

## 2014-12-03 LAB — HEMOGLOBIN A1C: Hgb A1c MFr Bld: 7.9 % — ABNORMAL HIGH (ref 4.6–6.5)

## 2014-12-03 MED ORDER — LEVOTHYROXINE SODIUM 75 MCG PO TABS: ORAL_TABLET | ORAL | Status: DC

## 2014-12-03 NOTE — Telephone Encounter (Signed)
Rx sent for 1 tablet 6 days a week and 1.5 tablet on Sunday.

## 2014-12-03 NOTE — Addendum Note (Signed)
Addended by: Moody Bruins E on: 12/03/2014 01:23 PM   Modules accepted: Orders

## 2014-12-03 NOTE — Telephone Encounter (Signed)
See note below and please advise if ok to refill. Current instructions are to take 1 tab daily before breakfast.

## 2014-12-03 NOTE — Telephone Encounter (Signed)
Pt needs thyroid med called into [facility] the rx needs to reflect the 1 tab daily 6 days and on Sunday she takes 1.5 tabs

## 2014-12-03 NOTE — Telephone Encounter (Signed)
That is fine 

## 2014-12-04 ENCOUNTER — Other Ambulatory Visit: Payer: Self-pay | Admitting: Endocrinology

## 2014-12-04 ENCOUNTER — Other Ambulatory Visit: Payer: Self-pay | Admitting: *Deleted

## 2014-12-04 MED ORDER — LEVOTHYROXINE SODIUM 75 MCG PO TABS: ORAL_TABLET | ORAL | Status: DC

## 2014-12-06 ENCOUNTER — Telehealth: Payer: Self-pay | Admitting: Endocrinology

## 2014-12-06 ENCOUNTER — Encounter: Payer: Self-pay | Admitting: Endocrinology

## 2014-12-06 ENCOUNTER — Ambulatory Visit (INDEPENDENT_AMBULATORY_CARE_PROVIDER_SITE_OTHER): Payer: Medicare Other | Admitting: Endocrinology

## 2014-12-06 VITALS — BP 144/62 | HR 70 | Temp 97.9°F | Resp 14 | Ht 59.0 in | Wt 132.6 lb

## 2014-12-06 DIAGNOSIS — IMO0002 Reserved for concepts with insufficient information to code with codable children: Secondary | ICD-10-CM

## 2014-12-06 DIAGNOSIS — D638 Anemia in other chronic diseases classified elsewhere: Secondary | ICD-10-CM

## 2014-12-06 DIAGNOSIS — E7800 Pure hypercholesterolemia, unspecified: Secondary | ICD-10-CM

## 2014-12-06 DIAGNOSIS — I10 Essential (primary) hypertension: Secondary | ICD-10-CM

## 2014-12-06 DIAGNOSIS — E063 Autoimmune thyroiditis: Secondary | ICD-10-CM

## 2014-12-06 DIAGNOSIS — E038 Other specified hypothyroidism: Secondary | ICD-10-CM

## 2014-12-06 DIAGNOSIS — E1165 Type 2 diabetes mellitus with hyperglycemia: Secondary | ICD-10-CM

## 2014-12-06 MED ORDER — INSULIN ASPART 100 UNIT/ML FLEXPEN: PEN_INJECTOR | SUBCUTANEOUS | Status: DC

## 2014-12-06 NOTE — Telephone Encounter (Signed)
error 

## 2014-12-06 NOTE — Progress Notes (Signed)
Patient ID: Jennifer Bailey, female   DOB: 02-Apr-1935, 80 y.o.   MRN: [id number]   Reason for Appointment:  followup   History of Present Illness   PREVENTIVE CARE:   Type 2 DIABETES MELITUS, date of diagnosis: 2003  She has been on insulin for several years but continues to have labile blood sugars despite using basal bolus regimen She is very compliant with her insulin at mealtimes and is consistent with the timing of her Levemir insulin twice a day Is taking small amounts of insulin and is not on any oral hypoglycemics, appears to be completely insulin-dependent Her diet is somewhat variable but she is usually having balanced meals and not eating out excessively      RECENT history:  Her [name] is about the same at 7.9% which is relatively better than previously She appears to have the usual fluctuation of her blood sugars without any real change in her diet or activity level Control is usually difficult because of significant fluctuation in blood sugars and occasional unexpected hypoglycemia Because of her fasting readings being high in 1/16 her evening Levemir was increased and her morning dose was reduced to 6 units Blood sugars at home recently are averaging overall lower than on her previous visit  Current blood sugar trends:  Overall her highest blood sugars are after breakfast or before lunch  Morning blood sugars are fluctuating somewhat but in the last few days are mostly low normal; had a rebound from a low sugar overnight from yesterday when she had a low sugar after supper  She is not checking blood sugars after breakfast very much but they are relatively lower  Blood sugars after evening meal or relatively lower also and she is taking the highest dose of NovoLog insulin at that time  Does have sporadic high readings around noon or later afternoon  Blood sugar before supper are variable but relatively higher  Again is trying to adjust her mealtime dose based on her  meal size and blood sugar level       Insulin regimen: Levemir 6 units a.m.--9 units hs;  mealtime coverage breakfast: 3-4, 4-5  lunch and 5-6 at supper       Proper timing of medications in relation to meals: Yes, will take NovoLog after eating a blood sugar low before meal.  Will take insulin with her when she is eating out .          Monitors blood glucose:  up to 4 times a day.    Glucometer: One Touch.          Blood Glucose readings from meter download:    PRE-MEAL  fasting  Lunch Dinner Bedtime Overall  Glucose range:  56-279   92-265   81-297   71-294   39-297   Median:  124   188   155   131   130+/-64    Hypoglycemia:  recently, lowest glucose 39 after supper last evening and woke up with the 56 reading at 5:40 AM about 10 days ago Symptoms with hypoglycemia: feels weeak and shaky Low sugars are treated with Soft drink or glucose tablets      Meals: 3 meals per day.  Usually has small portions. Eggs or waffle with protein at 7 am; has mid morning snack.  Evening meal usually balanced with 1-2 carbohydrates Physical activity: exercise: walking 2/7 and some gardening           Dietician visit: Most recent: 2004  Wt Readings from Last 3 Encounters:  12/06/14 132 lb 9.6 oz (60.147 kg)  08/20/14 131 lb 12.8 oz (59.784 kg)  05/18/14 129 lb 6.4 oz (61.607 kg)   Complications: are: Mild neuropathy controlled symptomatically  Lab Results  Component Value Date   HGBA1C 7.9* 12/03/2014   HGBA1C 7.9* 08/15/2014   HGBA1C 8.4* 05/14/2014   Lab Results  Component Value Date   MICROALBUR 0.7 02/12/2014   LDLCALC 57 12/03/2014   CREATININE 0.76 12/03/2014      LABS:  Lab on 12/03/2014  Component Date Value Ref Range Status  . Hgb [name] MFr Bld 12/03/2014 7.9* 4.6 - 6.5 % Final   Glycemic Control Guidelines for People with Diabetes:Non Diabetic:  <6%Goal of Therapy: <7%Additional Action Suggested:  >8%   . Sodium 12/03/2014 140  135 - 145 mEq/L Final  . Potassium  12/03/2014 3.8  3.5 - 5.1 mEq/L Final  . Chloride 12/03/2014 106  96 - 112 mEq/L Final  . CO2 12/03/2014 29  19 - 32 mEq/L Final  . Glucose, Bld 12/03/2014 113* 70 - 99 mg/dL Final  . BUN 12/03/2014 13  6 - 23 mg/dL Final  . Creatinine, Ser 12/03/2014 0.76  0.40 - 1.20 mg/dL Final  . Total Bilirubin 12/03/2014 0.3  0.2 - 1.2 mg/dL Final  . Alkaline Phosphatase 12/03/2014 81  39 - 117 U/L Final  . AST 12/03/2014 20  0 - 37 U/L Final  . ALT 12/03/2014 14  0 - 35 U/L Final  . Total Protein 12/03/2014 6.9  6.0 - 8.3 g/dL Final  . Albumin 12/03/2014 4.0  3.5 - 5.2 g/dL Final  . Calcium 12/03/2014 9.4  8.4 - 10.5 mg/dL Final  . GFR 12/03/2014 78.34  >60.00 mL/min Final  . Cholesterol 12/03/2014 132  0 - 200 mg/dL Final   ATP III Classification       Desirable:  < 200 mg/dL               Borderline High:  200 - 239 mg/dL          High:  > = 240 mg/dL  . Triglycerides 12/03/2014 133.0  0.0 - 149.0 mg/dL Final   Normal:  <150 mg/dLBorderline High:  150 - 199 mg/dL  . HDL 12/03/2014 48.30  >39.00 mg/dL Final  . VLDL 12/03/2014 26.6  0.0 - 40.0 mg/dL Final  . LDL Cholesterol 12/03/2014 57  0 - 99 mg/dL Final  . Total CHOL/HDL Ratio 12/03/2014 3   Final                  Men          Women1/2 Average Risk     3.4          3.3Average Risk          5.0          4.42X Average Risk          9.6          7.13X Average Risk          15.0          11.0                      . NonHDL 12/03/2014 83.70   Final   NOTE:  Non-HDL goal should be 30 mg/dL higher than patient's LDL goal (i.e. LDL goal of < 70 mg/dL, would have non-HDL goal of < 100 mg/dL)  Medication List       This list is accurate as of: 12/06/14  1:31 PM.  Always use your most recent med list.               Alcohol Pads 70 % Pads  USE 4 PER DAY     aspirin 81 MG tablet  Take 81 mg by mouth daily.     BD PEN NEEDLE NANO U/F 32G X 4 MM Misc  Generic drug:  Insulin Pen Needle     Insulin Pen Needle 32G X 4 MM Misc  Commonly known  as:  BD PEN NEEDLE NANO U/F  USE AS DIRECTED FIVE TIMES A DAY.     fish oil-omega-3 fatty acids 1000 MG capsule  Take 2 g by mouth daily. Two a day     FLUARIX QUADRIVALENT 0.5 ML injection  Generic drug:  influenza vac split quadrivalent PF     gabapentin 100 MG capsule  Commonly known as:  NEURONTIN  TAKE 3 TO 4 CAPSULES BY MOUTH AT BEDTIME.     glucose blood test strip  Commonly known as:  ONE TOUCH ULTRA TEST  Use as directed to check blood sugars 4 times per day dx code E11.65     insulin aspart 100 UNIT/ML FlexPen  Commonly known as:  NOVOLOG FLEXPEN  INJECT 3 TO 5 UNITS SUBCUTANEOUSLY BEFORE MEALS AS DIRECTED.     LEVEMIR FLEXTOUCH 100 UNIT/ML Pen  Generic drug:  Insulin Detemir  INJECT 8 UNITS SUBCUTANEOUSLY IN THE MORNING AND 6 UNITS AT NIGHT.     levothyroxine 75 MCG tablet  Commonly known as:  SYNTHROID, LEVOTHROID  TAKE 1 TABLET BY MOUTH DAILY BEFORE BREAKFAST. On Sunday take and extra 1/2 tablet     losartan 50 MG tablet  Commonly known as:  COZAAR  TAKE 1 TABLET BY MOUTH DAILY.     lovastatin 40 MG tablet  Commonly known as:  MEVACOR  TAKE 1 TABLET BY MOUTH ONCE DAILY WITH A MEAL.     omeprazole 20 MG capsule  Commonly known as:  PRILOSEC  TAKE 1 CAPSULE BY MOUTH ONCE DAILY.     PRESERVISION AREDS 2 Caps  Take by mouth.     vitamin C 100 MG tablet  Take 100 mg by mouth daily.     Vitamin D (Ergocalciferol) 50000 UNITS Caps capsule  Commonly known as:  DRISDOL  TAKE 1 CAPSULE BY MOUTH ONCE A MONTH.        Allergies:  Allergies  Allergen Reactions  . Macrodantin [Nitrofurantoin Macrocrystal]   . Penicillins     No past medical history on file.  Past Surgical History  Procedure Laterality Date  . Abdominal hysterectomy      Family History  Problem Relation Age of Onset  . Cancer Mother   . Diabetes Brother     Social History:  reports that she has never smoked. She has never used smokeless tobacco. Her alcohol and drug histories are  not on file.  Review of Systems:  She had a positive Hemoccult but colonoscopy showed only hemorrhoids Not on iron and hemoglobin is normal  OSTEOPENIA:  She has had osteopenia with T score -2.3 at the hip in 3/09 and was started on Fosamax at that time.  This was stopped in 5/15. No history of fracture Has been given 50,000 units vitamin D every 2 weeks for vitamin D. deficiency  HYPERTENSION:  this has been mild and treated with 50 mg losartan. Her blood  pressure again readily high in the office  [facility]rug store  BP 962 systolic, home BP as low as? 99 systolic Has not brought her monitor for comparison to the office despite instructions  HYPOTHYROIDISM: She has had primary hypothyroidism for over 20 years. Her levels have been stable with 75 mcg levothyroxine 7.5 tablets per week, has been on this dose since 11/15 Does have occasional  fatigue  Lab Results  Component Value Date   FREET4 0.93 02/12/2014   FREET4 1.07 08/07/2013   TSH 4.88* 08/15/2014   TSH 0.51 02/12/2014   TSH 1.13 08/07/2013    NEUROPATHY: She has mild stable symptoms and usually controlled by gabapentin  HYPERLIPIDEMIA: The lipid abnormality consists of elevated LDL, currently taking lovastatin with good control.  Lab Results  Component Value Date   CHOL 132 12/03/2014   HDL 48.30 12/03/2014   LDLCALC 57 12/03/2014   TRIG 133.0 12/03/2014   CHOLHDL 3 12/03/2014   No recent swelling of her feet.      Examination:   BP 168/82 mmHg  Pulse 70  Temp(Src) 97.9 F (36.6 C)  Resp 14  Ht 4\' 11"  (1.499 m)  Wt 132 lb 9.6 oz (60.147 kg)  BMI 26.77 kg/m2  SpO2 98%  Body mass index is 26.77 kg/(m^2).   No pedal edema  ASSESSMENT/ PLAN:   Diabetes type 2   The patient's diabetes control appears to be about the same as on her last visit See history of present illness for detailed discussion of current management, blood sugar patterns and problems identified Although she does tend to have fluctuation  consistently in her blood sugars unexplained by changes in diet and exercise her blood sugars are somewhat more stable then in the past couple of years Her blood sugars are showing the most variability midday Currently highest blood sugars appear to be mostly midday and afternoon Blood sugars are relatively low fasting and also after supper with current regimen  Recommendations made today: She will reduce her Levemir at suppertime is also one unit less on her suppertime NovoLog Will increase her Levemir by 1 unit in the morning to allow for better daytime blood sugar control before meals She will continue to check her sugar 3-4 times a day and call for abnormal patterns She probably needs to reduce her insulin for lower carbohydrate meals and when planning to be more active  Hypertension: Her blood pressure is difficult to assess as she has some white coat syndrome and she thinks it is usually well-controlled at the drug store or at home.  She does need to bring her monitor for review since she gets unusually low readings at times   Hypothyroidism : No unusual fatigue, dose was increased on her last visit and she will need a follow-up TSH  She will get her annual exam on her next visit Reminded her about annual eye exams  Counseling time over 50% of today's 25 minute visit  Kely Dohn 12/06/2014, 1:31 PM

## 2014-12-06 NOTE — Telephone Encounter (Signed)
Pt say she should of been schedule for a cpe in may but dr Dwyane Dee said follow can you verifiy this and call her back adn let her know.

## 2014-12-06 NOTE — Patient Instructions (Addendum)
Levemir 7 in am and 8 in pm  Novolog 4-5 at supper  May take Novolog right after eating if not sure of portions

## 2015-03-05 ENCOUNTER — Other Ambulatory Visit (INDEPENDENT_AMBULATORY_CARE_PROVIDER_SITE_OTHER): Payer: Medicare Other

## 2015-03-05 ENCOUNTER — Other Ambulatory Visit: Payer: Self-pay | Admitting: Endocrinology

## 2015-03-05 DIAGNOSIS — D638 Anemia in other chronic diseases classified elsewhere: Secondary | ICD-10-CM | POA: Diagnosis not present

## 2015-03-05 DIAGNOSIS — E063 Autoimmune thyroiditis: Secondary | ICD-10-CM

## 2015-03-05 DIAGNOSIS — E038 Other specified hypothyroidism: Secondary | ICD-10-CM

## 2015-03-05 DIAGNOSIS — IMO0002 Reserved for concepts with insufficient information to code with codable children: Secondary | ICD-10-CM

## 2015-03-05 DIAGNOSIS — E1165 Type 2 diabetes mellitus with hyperglycemia: Secondary | ICD-10-CM | POA: Diagnosis not present

## 2015-03-05 LAB — MICROALBUMIN / CREATININE URINE RATIO
Creatinine,U: 22.6 mg/dL
Microalb Creat Ratio: 3.1 mg/g (ref 0.0–30.0)
Microalb, Ur: 0.7 mg/dL (ref 0.0–1.9)

## 2015-03-05 LAB — CBC
HCT: 38.5 % (ref 36.0–46.0)
Hemoglobin: 12.7 g/dL (ref 12.0–15.0)
MCHC: 33 g/dL (ref 30.0–36.0)
MCV: 91.1 fl (ref 78.0–100.0)
Platelets: 282 10*3/uL (ref 150.0–400.0)
RBC: 4.22 Mil/uL (ref 3.87–5.11)
RDW: 14.5 % (ref 11.5–15.5)
WBC: 9.6 10*3/uL (ref 4.0–10.5)

## 2015-03-05 LAB — URINALYSIS, ROUTINE W REFLEX MICROSCOPIC
Bilirubin Urine: NEGATIVE
Hgb urine dipstick: NEGATIVE
Ketones, ur: NEGATIVE
Leukocytes, UA: NEGATIVE
Nitrite: NEGATIVE
Specific Gravity, Urine: <=1.005 — AB (ref 1.000–1.030)
Total Protein, Urine: NEGATIVE
Urine Glucose: NEGATIVE
Urobilinogen, UA: 0.2 (ref 0.0–1.0)
pH: 6 (ref 5.0–8.0)

## 2015-03-05 LAB — T4, FREE: Free T4: 0.94 ng/dL (ref 0.60–1.60)

## 2015-03-05 LAB — COMPREHENSIVE METABOLIC PANEL
ALT: 12 U/L (ref 0–35)
AST: 20 U/L (ref 0–37)
Albumin: 4 g/dL (ref 3.5–5.2)
Alkaline Phosphatase: 79 U/L (ref 39–117)
BUN: 13 mg/dL (ref 6–23)
CO2: 27 mEq/L (ref 19–32)
Calcium: 9.4 mg/dL (ref 8.4–10.5)
Chloride: 104 mEq/L (ref 96–112)
Creatinine, Ser: 0.76 mg/dL (ref 0.40–1.20)
GFR: 78.29 mL/min (ref >60.00)
Glucose, Bld: 124 mg/dL — ABNORMAL HIGH (ref 70–99)
Potassium: 3.9 mEq/L (ref 3.5–5.1)
Sodium: 138 mEq/L (ref 135–145)
Total Bilirubin: 0.3 mg/dL (ref 0.2–1.2)
Total Protein: 6.9 g/dL (ref 6.0–8.3)

## 2015-03-05 LAB — HEMOGLOBIN A1C: Hgb A1c MFr Bld: 8 % — ABNORMAL HIGH (ref 4.6–6.5)

## 2015-03-05 LAB — TSH: TSH: 1.41 u[IU]/mL (ref 0.35–4.50)

## 2015-03-07 ENCOUNTER — Encounter: Payer: Self-pay | Admitting: Endocrinology

## 2015-03-07 ENCOUNTER — Ambulatory Visit: Payer: Medicare Other | Admitting: Endocrinology

## 2015-03-07 ENCOUNTER — Ambulatory Visit (INDEPENDENT_AMBULATORY_CARE_PROVIDER_SITE_OTHER): Payer: Medicare Other | Admitting: Endocrinology

## 2015-03-07 VITALS — BP 124/70 | HR 84 | Temp 97.8°F | Resp 14 | Ht 59.0 in | Wt 128.2 lb

## 2015-03-07 DIAGNOSIS — M8580 Other specified disorders of bone density and structure, unspecified site: Secondary | ICD-10-CM

## 2015-03-07 DIAGNOSIS — E11649 Type 2 diabetes mellitus with hypoglycemia without coma: Secondary | ICD-10-CM

## 2015-03-07 DIAGNOSIS — E063 Autoimmune thyroiditis: Secondary | ICD-10-CM

## 2015-03-07 DIAGNOSIS — E1165 Type 2 diabetes mellitus with hyperglycemia: Secondary | ICD-10-CM | POA: Diagnosis not present

## 2015-03-07 DIAGNOSIS — Z0000 Encounter for general adult medical examination without abnormal findings: Secondary | ICD-10-CM | POA: Diagnosis not present

## 2015-03-07 DIAGNOSIS — E038 Other specified hypothyroidism: Secondary | ICD-10-CM

## 2015-03-07 DIAGNOSIS — IMO0002 Reserved for concepts with insufficient information to code with codable children: Secondary | ICD-10-CM

## 2015-03-07 LAB — POCT GLUCOSE (DEVICE FOR HOME USE): Glucose Fasting, POC: 52 mg/dL — AB (ref 70–99)

## 2015-03-07 MED ORDER — MIRTAZAPINE 7.5 MG PO TABS: 7.5000 mg | ORAL_TABLET | Freq: Every day | ORAL | Status: DC

## 2015-03-07 NOTE — Progress Notes (Addendum)
Patient ID: Jennifer Bailey, female   DOB: 06/06/1935, 79 y.o.   MRN: [id number]   Reason for Appointment:  Complete physical exam and multiple issues   History of Present Illness   Chief complaint: Difficulty sleeping  Problem 1: She lost her husband a few weeks ago and has felt stressed.  She is still grieving and feels somewhat low emotionally at times although she is better when she is with her daughter during the day.  She was given nortriptyline 50 mg by her PCP for the depression but she did not like taking this because of heartburn.  She still has some difficulty sleeping at night although is trying to take Benadryl for this  OSTEOPENIA:  She has had osteopenia with T score -2.3 at the hip in 3/09 and was started on Fosamax at that time.  This was stopped in 5/15. No history of fracture Has been given 50,000 units vitamin D every 2 weeks for vitamin D. Deficiency Vitamin D level was 48 in 2015  Type 2 DIABETES MELITUS, date of diagnosis: 2003  She has been on insulin for several years but continues to have labile blood sugars despite using basal bolus regimen She is very compliant with her insulin at mealtimes and is consistent with the timing of her Levemir insulin twice a day Is taking small amounts of insulin and is not on any oral hypoglycemics, appears to be completely insulin-dependent Her diet is somewhat variable but she is usually having balanced meals and not eating out excessively      RECENT history:  Her [name] is about the same at 7.9% which is relatively better than previously She appears to have the usual fluctuation of her blood sugars without any real change in her diet or activity level Control is usually difficult because of significant fluctuation in blood sugars and occasional unexpected hypoglycemia Because of her fasting readings being high in 1/16 her evening Levemir was increased and her morning dose was reduced to 6 units Blood sugars at home recently are  averaging overall lower than on her previous visit  Current blood sugar trends and problems identified:  Overall her highest blood sugars are after evening meal or lunch on an average  She still has variable blood sugars FASTING with no consistent trend and slightly higher in the last 2 mornings  Blood sugars are generally fairly good around lunchtime  She has had sporadic hypoglycemia, once after lunch and twice right after supper  Blood sugars recently after supper are as low as 79 and not as consistently high breakfast or before lunch  She tends to get excessive rebound from low to high readings  Today in the office her blood sugar got relatively low and she was treated with a regular soft drinks because of symptoms and low reading even though she had a normal breakfast  Again is trying to adjust her mealtime dose based on her meal size and blood sugar level and will take 1 extra unit for blood sugars over 200       Insulin regimen: Levemir 7 units a.m.--8 units hs;  mealtime coverage breakfast: 3-4, 5  lunch and 5-6 at supper       Proper timing of medications in relation to meals: Yes, will take NovoLog after eating a blood sugar low before meal.  Will take insulin with her when she is eating out .          Monitors blood glucose:  up to 4 times a  day.    Glucometer: One Touch.          Blood Glucose readings from meter download:    Mean values apply above for all meters except median for One Touch  PRE-MEAL Fasting Lunch Dinner Bedtime Overall  Glucose range:  81-312   48-224   56-271   79-331    Mean/median:  136   127   180   173  135   Hypoglycemia:  recently, lowest glucose  39 after supper last evening and woke up with the 56 reading at 5:40 AM about 10 days ago Symptoms with hypoglycemia: feels weeak and shaky Low sugars are treated with Soft drink or glucose tablets      Meals: 3 meals per day.  Usually has small portions. Eggs or waffle with protein at 7 am; has mid  morning snack. Occ hs snack   Evening meal usually balanced at 5 pm with 1-2 carbohydrates Physical activity: exercise: walking 2/7 and some gardening           Dietician visit: Most recent: 2004            Wt Readings from Last 3 Encounters:  03/07/15 128 lb 3.2 oz (58.151 kg)  12/06/14 132 lb 9.6 oz (60.147 kg)  08/20/14 131 lb 12.8 oz (31.497 kg)   Complications: are: Mild neuropathy controlled symptomatically  Lab Results  Component Value Date   HGBA1C 8.0* 03/05/2015   HGBA1C 7.9* 12/03/2014   HGBA1C 7.9* 08/15/2014   Lab Results  Component Value Date   MICROALBUR 0.7 03/05/2015   LDLCALC 57 12/03/2014   CREATININE 0.76 03/05/2015     HYPERTENSION:  this has been mild and treated with 50 mg losartan. Her blood pressure is well controlled without lightheadedness, she sometimes has systolic readings as low as 99 at home without symptoms   PREVENTIVE CARE:   Annual hemoccults:           5/15   Breast self exams:                            Yes  Colonoscopy/sigmoidoscopy  04/04/14  Mammograms:  11/2014   Yearly flu vaccine:                      yes   Bone Density:   2009, T score -2.3  Calcium supplements:  taking  Tetanus booster:  ?   Zostavax: 2003 Pneumovax: 2005   Diet: Generally low fat Exercise: Yard work, some indoor exercises or walking History of falls: None    LABS:  Lab on 03/05/2015  Component Date Value Ref Range Status  . Hgb [name] MFr Bld 03/05/2015 8.0* 4.6 - 6.5 % Final   Glycemic Control Guidelines for People with Diabetes:Non Diabetic:  <6%Goal of Therapy: <7%Additional Action Suggested:  >8%   . Sodium 03/05/2015 138  135 - 145 mEq/L Final  . Potassium 03/05/2015 3.9  3.5 - 5.1 mEq/L Final  . Chloride 03/05/2015 104  96 - 112 mEq/L Final  . CO2 03/05/2015 27  19 - 32 mEq/L Final  . Glucose, Bld 03/05/2015 124* 70 - 99 mg/dL Final  . BUN 03/05/2015 13  6 - 23 mg/dL Final  . Creatinine, Ser 03/05/2015 0.76  0.40 - 1.20 mg/dL Final  . Total  Bilirubin 03/05/2015 0.3  0.2 - 1.2 mg/dL Final  . Alkaline Phosphatase 03/05/2015 79  39 - 117 U/L Final  .  AST 03/05/2015 20  0 - 37 U/L Final  . ALT 03/05/2015 12  0 - 35 U/L Final  . Total Protein 03/05/2015 6.9  6.0 - 8.3 g/dL Final  . Albumin 03/05/2015 4.0  3.5 - 5.2 g/dL Final  . Calcium 03/05/2015 9.4  8.4 - 10.5 mg/dL Final  . GFR 03/05/2015 78.29  >60.00 mL/min Final  . Microalb, Ur 03/05/2015 0.7  0.0 - 1.9 mg/dL Final  . Creatinine,U 03/05/2015 22.6   Final  . Microalb Creat Ratio 03/05/2015 3.1  0.0 - 30.0 mg/g Final  . Color, Urine 03/05/2015 YELLOW  Yellow;Lt. Yellow Final  . APPearance 03/05/2015 CLEAR  Clear Final  . Specific Gravity, Urine 03/05/2015 <=1.005* 1.000 - 1.030 Final  . pH 03/05/2015 6.0  5.0 - 8.0 Final  . Total Protein, Urine 03/05/2015 NEGATIVE  Negative Final  . Urine Glucose 03/05/2015 NEGATIVE  Negative Final  . Ketones, ur 03/05/2015 NEGATIVE  Negative Final  . Bilirubin Urine 03/05/2015 NEGATIVE  Negative Final  . Hgb urine dipstick 03/05/2015 NEGATIVE  Negative Final  . Urobilinogen, UA 03/05/2015 0.2  0.0 - 1.0 Final  . Leukocytes, UA 03/05/2015 NEGATIVE  Negative Final  . Nitrite 03/05/2015 NEGATIVE  Negative Final  . WBC, UA 03/05/2015 0-2/hpf  0-2/hpf Final  . RBC / HPF 03/05/2015 0-2/hpf  0-2/hpf Final  . TSH 03/05/2015 1.41  0.35 - 4.50 uIU/mL Final  . Free T4 03/05/2015 0.94  0.60 - 1.60 ng/dL Final  . WBC 03/05/2015 9.6  4.0 - 10.5 K/uL Final  . RBC 03/05/2015 4.22  3.87 - 5.11 Mil/uL Final  . Platelets 03/05/2015 282.0  150.0 - 400.0 K/uL Final  . Hemoglobin 03/05/2015 12.7  12.0 - 15.0 g/dL Final  . HCT 03/05/2015 38.5  36.0 - 46.0 % Final  . MCV 03/05/2015 91.1  78.0 - 100.0 fl Final  . MCHC 03/05/2015 33.0  30.0 - 36.0 g/dL Final  . RDW 03/05/2015 14.5  11.5 - 15.5 % Final      Medication List       This list is accurate as of: 03/07/15  9:11 AM.  Always use your most recent med list.               Alcohol Pads 70 % Pads   USE 4 PER DAY     aspirin 81 MG tablet  Take 81 mg by mouth daily.     BD PEN NEEDLE NANO U/F 32G X 4 MM Misc  Generic drug:  Insulin Pen Needle     Insulin Pen Needle 32G X 4 MM Misc  Commonly known as:  BD PEN NEEDLE NANO U/F  USE AS DIRECTED FIVE TIMES A DAY.     diphenhydrAMINE 25 MG tablet  Commonly known as:  SOMINEX  Take 25 mg by mouth at bedtime as needed for sleep.     fish oil-omega-3 fatty acids 1000 MG capsule  Take 2 g by mouth daily. Two a day     FLUARIX QUADRIVALENT 0.5 ML injection  Generic drug:  influenza vac split quadrivalent PF     gabapentin 100 MG capsule  Commonly known as:  NEURONTIN  TAKE 3 TO 4 CAPSULES BY MOUTH AT BEDTIME.     glucose blood test strip  Commonly known as:  ONE TOUCH ULTRA TEST  Use as directed to check blood sugars 4 times per day dx code E11.65     insulin aspart 100 UNIT/ML FlexPen  Commonly known as:  NOVOLOG FLEXPEN  INJECT 3 TO 5 UNITS SUBCUTANEOUSLY BEFORE MEALS AS DIRECTED.     LEVEMIR FLEXTOUCH 100 UNIT/ML Pen  Generic drug:  Insulin Detemir  INJECT 8 UNITS SUBCUTANEOUSLY IN THE MORNING AND 6 UNITS AT NIGHT.     levothyroxine 75 MCG tablet  Commonly known as:  SYNTHROID, LEVOTHROID  TAKE 1 TABLET BY MOUTH DAILY BEFORE BREAKFAST. On Sunday take and extra 1/2 tablet     losartan 50 MG tablet  Commonly known as:  COZAAR  TAKE 1 TABLET BY MOUTH DAILY.     lovastatin 40 MG tablet  Commonly known as:  MEVACOR  TAKE 1 TABLET BY MOUTH ONCE DAILY WITH A MEAL.     omeprazole 20 MG capsule  Commonly known as:  PRILOSEC  TAKE 1 CAPSULE BY MOUTH ONCE DAILY.     PRESERVISION AREDS 2 Caps  Take by mouth.     vitamin C 100 MG tablet  Take 100 mg by mouth daily.     Vitamin D (Ergocalciferol) 50000 UNITS Caps capsule  Commonly known as:  DRISDOL  TAKE 1 CAPSULE BY MOUTH ONCE A MONTH.        Allergies:  Allergies  Allergen Reactions  . Macrodantin [Nitrofurantoin Macrocrystal]   . Penicillins     No past  medical history on file.  Past Surgical History  Procedure Laterality Date  . Abdominal hysterectomy      Family History  Problem Relation Age of Onset  . Cancer Mother   . Diabetes Brother     Social History:  reports that she has never smoked. She has never used smokeless tobacco. Her alcohol and drug histories are not on file.  Review of Systems:  Review of Systems  Constitutional: Negative for malaise/fatigue.       Has lost 3 pounds, probably from recent stress  Eyes: Negative for blurred vision.  Respiratory: Negative for cough and shortness of breath.   Cardiovascular: Negative for chest pain, palpitations and leg swelling.  Gastrointestinal: Negative for nausea and abdominal pain.       Rarely will have diarrhea, has had this since her colon surgery. Has history of chronic reflux controlled with Prilosec  Genitourinary: Negative for frequency.       Nocturia only once.  Has minimal incontinence and wears a pad  Musculoskeletal: Negative for myalgias, back pain and joint pain.  Skin: Negative for rash.  Neurological: Negative for tingling and headaches.       No numbness in hands or feet She has had burning in her feet and occasional pain in her legs.  She takes 3-4 gabapentin at night to relieve symptoms  Endo/Heme/Allergies: Negative for polydipsia.       No heat or cold intolerance. No unusual fatigue  Psychiatric/Behavioral: Negative for memory loss. The patient has insomnia.     She had a positive Hemoccult but colonoscopy showed only hemorrhoids in 7/15 Not on iron and hemoglobin is normal  Lab Results  Component Value Date   WBC 9.6 03/05/2015   HGB 12.7 03/05/2015   HCT 38.5 03/05/2015   MCV 91.1 03/05/2015   PLT 282.0 03/05/2015    HYPOTHYROIDISM: She has had primary hypothyroidism for over 20 years. Her levels have been stable with 75 mcg levothyroxine 7.5 tablets per week, has been on this dose since 11/15 Does have occasional  fatigue  Lab  Results  Component Value Date   FREET4 0.94 03/05/2015   FREET4 0.93 02/12/2014   FREET4 1.07 08/07/2013   TSH 1.41  03/05/2015   TSH 4.88* 08/15/2014   TSH 0.51 02/12/2014     HYPERLIPIDEMIA: The lipid abnormality consists of elevated LDL, currently taking lovastatin with good control.  Lab Results  Component Value Date   CHOL 132 12/03/2014   HDL 48.30 12/03/2014   LDLCALC 57 12/03/2014   TRIG 133.0 12/03/2014   CHOLHDL 3 12/03/2014         Examination:   BP 124/70 mmHg  Pulse 84  Temp(Src) 97.8 F (36.6 C)  Resp 14  Ht 4\' 11"  (1.499 m)  Wt 128 lb 3.2 oz (58.151 kg)  BMI 25.88 kg/m2  SpO2 98%  Body mass index is 25.88 kg/(m^2).    GENERAL: Has a small build  No pallor, clubbing, lymphadenopathy or edema.    EYES:  Externally normal.  Fundii:  normal discs and vessels   ENT: Mouth & pharynx normal.  THYROID:  Not palpable.  CAROTIDS:  Normal character; no bruit.  BREAST exam: No mass palpable on either side. Grossly  HEART:  Normal apex, S1 and S2; no murmur or click.  CHEST:  Normal shape.  Lungs:  Vescicular breath sounds heard equally.  No crepitations/ wheeze.  ABDOMEN:  No distention.  Liver and spleen not palpable.  No other mass or tenderness. No abnormal pulsation in epigastrium  RECTAL exam:  not indicated  NEUROLOGICAL: She quickly performs get up and go test Romberg sign is minimally positive Gait is normal Motor power grossly normal in all 4 extremities Vibration sense at toes is mildly reduced.  Reflexes are 1+ at left ankle and difficult to elicit on the right Biceps reflexes are brisk . Diabetic foot exam shows normal monofilament sensation in the toes and plantar surfaces, no skin lesions or ulcers on the feet and normal pedal pulses  SPINE AND JOINTS:  minimal osteoarthritis in hands.  Skin:  no rash. Has multiple benign brownish pigmented lesions on back   ASSESSMENT/ PLAN:   Diabetes type 2   The patient's diabetes control  appears to be about the same with [name] 8%. See history of present illness for detailed discussion of current management, blood sugar patterns and problems identified Difficult to control her diabetes because of fatigue and variability in her blood sugars and periodic hypoglycemia as discussed above Most of her hyperglycemia is unexpected and appears to be requiring a little less insulin to cover her meals recently She is generally eating balanced meals and avoiding excess carbohydrates and fat Does not tend to get hypoglycemia with any activity Discussed treatment of hypoglycemia when it happens and prevention  Recommendations made today:  Continue Levemir unchanged as she does not have any consistent patterns before breakfast or supper Reduce coverage at breakfast and dinner by 1 unit for her Novolog  DIABETIC neuropathy: Symptomatically controlled with gabapentin  Situational depression and insomnia: She is still grieving and not get out of her depressed mood.  Since she is mostly complaining about insomnia she will use Remeron 7.5 mg at night instead of diphenhydramine She will also reduce her gabapentin by 1 capsule when taking Remeron  Hypertension: Her blood pressure is well-controlled  Hypothyroidism : No unusual fatigue, her TSH is now normal with current regimen of 75 g, 7-1/2 tablets a week  Osteopenia/vitamin D deficiency: She will continue taking vitamin D and will have follow-up level done on the next visit    Patient Instructions  Check blood sugars on waking up ..3-4.Marland Kitchen times a week Also check blood sugars about 2 hours after  a meal and do this after different meals by rotation  Recommended blood sugar levels on waking up is 90-130 and about 2 hours after meal is 140-180 Please bring blood sugar monitor to each visit.  Reduce Novolog in am to 2 units and 4 at supper  Take Mirtazapine about an hour before bedtime for sleep,  do not take Sominex at the same time.  Also  reduce the gabapentin to 2 capsules when taking this  Annual mammograms as discussed Check breasts for lumps monthly with the palm of the hand  Take Vitamin D as prescribed and 500 mg of calcium daily  Low saturated fat diet, low intake of sugar and salt  Regular walking exercise 5 days a week  Regular dental and eye exams as recommended by the specialists  Make sure smoke detectors are functional at home Wear seat belts all the time   Counseling time on subjects discussed above is over 50% of today's evaluation and management portion      [facility] 03/07/2015, 9:11 AM

## 2015-03-07 NOTE — Patient Instructions (Addendum)
Check blood sugars on waking up ..3-4.Marland Kitchen times a week Also check blood sugars about 2 hours after a meal and do this after different meals by rotation  Recommended blood sugar levels on waking up is 90-130 and about 2 hours after meal is 140-180 Please bring blood sugar monitor to each visit.  Reduce Novolog in am to 2 units and 4 at supper  Take Mirtazapine about an hour before bedtime for sleep,  do not take Sominex at the same time.  Also reduce the gabapentin to 2 capsules when taking this  Annual mammograms as discussed Check breasts for lumps monthly with the palm of the hand  Take Vitamin D as prescribed and 500 mg of calcium daily  Low saturated fat diet, low intake of sugar and salt  Regular walking exercise 5 days a week  Regular dental and eye exams as recommended by the specialists  Make sure smoke detectors are functional at home Wear seat belts all the time

## 2015-06-03 ENCOUNTER — Other Ambulatory Visit: Payer: Self-pay | Admitting: Endocrinology

## 2015-06-05 ENCOUNTER — Telehealth: Payer: Self-pay | Admitting: Endocrinology

## 2015-06-05 ENCOUNTER — Encounter: Payer: Self-pay | Admitting: Endocrinology

## 2015-06-05 ENCOUNTER — Other Ambulatory Visit: Payer: Self-pay | Admitting: *Deleted

## 2015-06-05 MED ORDER — GLUCOSE BLOOD VI STRP: ORAL_STRIP | Status: DC

## 2015-06-05 NOTE — Telephone Encounter (Signed)
rx sent

## 2015-06-05 NOTE — Telephone Encounter (Signed)
Pt needs refills on test strips ( one touch ultra 2)

## 2015-06-06 ENCOUNTER — Other Ambulatory Visit: Payer: Medicare Other

## 2015-06-13 ENCOUNTER — Ambulatory Visit: Payer: Medicare Other | Admitting: Endocrinology

## 2015-06-17 ENCOUNTER — Other Ambulatory Visit: Payer: Medicare Other

## 2015-06-17 LAB — HM DIABETES EYE EXAM

## 2015-06-24 ENCOUNTER — Other Ambulatory Visit: Payer: Medicare Other

## 2015-06-27 ENCOUNTER — Ambulatory Visit: Payer: Medicare Other | Admitting: Endocrinology

## 2015-07-04 ENCOUNTER — Other Ambulatory Visit: Payer: Self-pay | Admitting: Endocrinology

## 2015-07-08 ENCOUNTER — Other Ambulatory Visit (INDEPENDENT_AMBULATORY_CARE_PROVIDER_SITE_OTHER): Payer: Medicare Other

## 2015-07-08 ENCOUNTER — Other Ambulatory Visit: Payer: Medicare Other

## 2015-07-08 DIAGNOSIS — E1165 Type 2 diabetes mellitus with hyperglycemia: Secondary | ICD-10-CM | POA: Diagnosis not present

## 2015-07-08 DIAGNOSIS — M8580 Other specified disorders of bone density and structure, unspecified site: Secondary | ICD-10-CM | POA: Diagnosis not present

## 2015-07-08 DIAGNOSIS — IMO0002 Reserved for concepts with insufficient information to code with codable children: Secondary | ICD-10-CM

## 2015-07-08 LAB — BASIC METABOLIC PANEL
BUN: 14 mg/dL (ref 6–23)
CO2: 26 mEq/L (ref 19–32)
Calcium: 9.6 mg/dL (ref 8.4–10.5)
Chloride: 104 mEq/L (ref 96–112)
Creatinine, Ser: 0.8 mg/dL (ref 0.40–1.20)
GFR: 73.72 mL/min (ref >60.00)
Glucose, Bld: 74 mg/dL (ref 70–99)
Potassium: 4.4 mEq/L (ref 3.5–5.1)
Sodium: 140 mEq/L (ref 135–145)

## 2015-07-08 LAB — VITAMIN D 25 HYDROXY (VIT D DEFICIENCY, FRACTURES): VITD: 58.87 ng/mL (ref 30.00–100.00)

## 2015-07-08 LAB — HEMOGLOBIN A1C: Hgb A1c MFr Bld: 8 % — ABNORMAL HIGH (ref 4.6–6.5)

## 2015-07-10 ENCOUNTER — Other Ambulatory Visit: Payer: Self-pay | Admitting: Endocrinology

## 2015-07-11 ENCOUNTER — Ambulatory Visit (INDEPENDENT_AMBULATORY_CARE_PROVIDER_SITE_OTHER): Payer: Medicare Other | Admitting: Endocrinology

## 2015-07-11 ENCOUNTER — Encounter: Payer: Self-pay | Admitting: Endocrinology

## 2015-07-11 ENCOUNTER — Telehealth: Payer: Self-pay | Admitting: Endocrinology

## 2015-07-11 VITALS — BP 136/70 | HR 64 | Temp 98.5°F | Resp 14 | Ht 59.0 in | Wt 127.6 lb

## 2015-07-11 DIAGNOSIS — E118 Type 2 diabetes mellitus with unspecified complications: Secondary | ICD-10-CM | POA: Diagnosis not present

## 2015-07-11 DIAGNOSIS — E1142 Type 2 diabetes mellitus with diabetic polyneuropathy: Secondary | ICD-10-CM

## 2015-07-11 DIAGNOSIS — E038 Other specified hypothyroidism: Secondary | ICD-10-CM | POA: Diagnosis not present

## 2015-07-11 DIAGNOSIS — IMO0002 Reserved for concepts with insufficient information to code with codable children: Secondary | ICD-10-CM

## 2015-07-11 DIAGNOSIS — E1165 Type 2 diabetes mellitus with hyperglycemia: Secondary | ICD-10-CM

## 2015-07-11 DIAGNOSIS — M8580 Other specified disorders of bone density and structure, unspecified site: Secondary | ICD-10-CM | POA: Diagnosis not present

## 2015-07-11 DIAGNOSIS — Z794 Long term (current) use of insulin: Secondary | ICD-10-CM

## 2015-07-11 DIAGNOSIS — Z23 Encounter for immunization: Secondary | ICD-10-CM

## 2015-07-11 DIAGNOSIS — E063 Autoimmune thyroiditis: Secondary | ICD-10-CM

## 2015-07-11 NOTE — Progress Notes (Signed)
Patient ID: CONTINA STRAIN, female   DOB: Nov 21, 1934, 80 y.o.   MRN: [id number]  Reason for Appointment: Diabetes follow-up   History of Present Illness     Type 2 DIABETES MELITUS, date of diagnosis: 2003  She has been on insulin for several years but continues to have labile blood sugars despite using basal bolus regimen She is very compliant with her insulin at mealtimes and is consistent with the timing of her Levemir insulin twice a day Is taking small amounts of insulin and is not on any oral hypoglycemics, appears to be completely insulin-dependent Her diet is somewhat variable but she is usually having balanced meals and not eating out excessively      RECENT history:  Her [name] is about the same at 8 %  She appears to have the usual fluctuation of her blood sugars without any real change in her diet or activity level Her Novolog at breakfast and suppertime were reduced on the last visit because of tendency to more hypoglycemia  Current blood sugar trends and problems identified:  She has had marked variability in her blood sugars, more than usual  She says that when her sugars started going up to over 400 and the afternoons she change her Novolog to a new pen since she had been at the beach with her insulin.  Subsequently blood sugars started coming down  Her fasting readings are still relatively high  Blood sugars after evening meal out quite variable with even recent readings ranging from 54-336 and she cannot explain this  She does not think her increased physical activities in the mornings tend to cause low sugars  She is trying to adjust her mealtime dose based on her meal size and blood sugar level and will take 1 extra unit Novolog for blood sugars over 200       Insulin regimen: Levemir 8 units 7 a.m.--6 units 9 pm;  mealtime coverage breakfast: 2-3, 5  lunch and 4 at supper       Proper timing of medications in relation to meals: Yes, will take NovoLog  after eating a blood sugar low before meal.  Will take insulin with her when she is eating out .          Monitors blood glucose:  up to 4 times a day.    Glucometer: One Touch.          Blood Glucose readings from meter download:    Mean values apply above for all meters except median for One Touch  PRE-MEAL Fasting Lunch Dinner  PCS  Overall  Glucose range:  64-436   56-404   103-470   54-400    Mean/median:  166   131    180   165+/-94     Hypoglycemia:  recently has had only a couple of low readings, lowest glucose was 54 after supper Symptoms with hypoglycemia: feels weeak and shaky Low sugars are treated with Soft drink or glucose tablets      Meals: 3 meals per day.  Usually has small portions. Eggs or waffle with protein at 7 am; has mid morning snack. Dinner 5 pmMay rarely have hs snack   Evening meal usually balanced at 5 pm with 1-2 carbohydrates Physical activity: exercise: walking 2/7 and some gardening           Dietician visit: Most recent: 2004            Wt Readings from Last 3 Encounters:  07/11/15 127 lb 9.6 oz (57.879 kg)  03/07/15 128 lb 3.2 oz (58.151 kg)  12/06/14 132 lb 9.6 oz (22.025 kg)   Complications: are: Mild neuropathy controlled symptomatically  Lab Results  Component Value Date   HGBA1C 8.0* 07/08/2015   HGBA1C 8.0* 03/05/2015   HGBA1C 7.9* 12/03/2014   Lab Results  Component Value Date   MICROALBUR 0.7 03/05/2015   LDLCALC 57 12/03/2014   CREATININE 0.80 07/08/2015     HYPERTENSION:  this has been mild and treated with 50 mg losartan. Her blood pressure is well controlled without lightheadedness, she sometimes has systolic readings as low as 99 at home without symptoms   PREVENTIVE CARE:   Annual hemoccults:           5/15   Breast self exams:                            Yes  Colonoscopy/sigmoidoscopy  04/04/14  Mammograms:  11/2014   Yearly flu vaccine:                      yes   Bone Density:   2009, T score -2.3  Calcium  supplements:  taking  Tetanus booster:  ?   Zostavax: 2003 Pneumovax: 2005   Diet: Generally low fat Exercise: Yard work, some indoor exercises or walking History of falls: None    LABS:  Appointment on 07/08/2015  Component Date Value Ref Range Status  . Hgb [name] MFr Bld 07/08/2015 8.0* 4.6 - 6.5 % Final   Glycemic Control Guidelines for People with Diabetes:Non Diabetic:  <6%Goal of Therapy: <7%Additional Action Suggested:  >8%   . Sodium 07/08/2015 140  135 - 145 mEq/L Final  . Potassium 07/08/2015 4.4  3.5 - 5.1 mEq/L Final  . Chloride 07/08/2015 104  96 - 112 mEq/L Final  . CO2 07/08/2015 26  19 - 32 mEq/L Final  . Glucose, Bld 07/08/2015 74  70 - 99 mg/dL Final  . BUN 07/08/2015 14  6 - 23 mg/dL Final  . Creatinine, Ser 07/08/2015 0.80  0.40 - 1.20 mg/dL Final  . Calcium 07/08/2015 9.6  8.4 - 10.5 mg/dL Final  . GFR 07/08/2015 73.72  >60.00 mL/min Final  . VITD 07/08/2015 58.87  30.00 - 100.00 ng/mL Final      Medication List       This list is accurate as of: 07/11/15 12:18 PM.  Always use your most recent med list.               Alcohol Pads 70 % Pads  USE 4 PER DAY     aspirin 81 MG tablet  Take 81 mg by mouth daily.     BD PEN NEEDLE NANO U/F 32G X 4 MM Misc  Generic drug:  Insulin Pen Needle     Insulin Pen Needle 32G X 4 MM Misc  Commonly known as:  BD PEN NEEDLE NANO U/F  USE AS DIRECTED FIVE TIMES A DAY.     diphenhydrAMINE 25 MG tablet  Commonly known as:  SOMINEX  Take 25 mg by mouth at bedtime as needed for sleep.     fish oil-omega-3 fatty acids 1000 MG capsule  Take 2 g by mouth daily. Two a day     FLUARIX QUADRIVALENT 0.5 ML injection  Generic drug:  influenza vac split quadrivalent PF     gabapentin 100 MG capsule  Commonly known as:  NEURONTIN  TAKE 3 TO 4 CAPSULES BY MOUTH AT BEDTIME.     glucose blood test strip  Commonly known as:  ONE TOUCH ULTRA TEST  Use as directed to check blood sugars 4 times per day dx code E11.65      LEVEMIR FLEXTOUCH 100 UNIT/ML Pen  Generic drug:  Insulin Detemir  INJECT 8 UNITS SUBCUTANEOUSLY IN THE MORNING AND 6 UNITS AT NIGHT.     levothyroxine 75 MCG tablet  Commonly known as:  SYNTHROID, LEVOTHROID  TAKE 1 TABLET BY MOUTH DAILY BEFORE BREAKFAST, EXCEPT TAKE 1 AND 1/2 TABLETS ON SUNDAY.     losartan 50 MG tablet  Commonly known as:  COZAAR  TAKE 1 TABLET BY MOUTH DAILY.     lovastatin 40 MG tablet  Commonly known as:  MEVACOR  TAKE 1 TABLET BY MOUTH ONCE DAILY WITH A MEAL.     mirtazapine 7.5 MG tablet  Commonly known as:  REMERON  Take 1 tablet (7.5 mg total) by mouth at bedtime.     NOVOLOG FLEXPEN 100 UNIT/ML FlexPen  Generic drug:  insulin aspart  INJECT 3 TO 5 UNITS SUBCUTANEOUSLY BEFORE MEALS AS DIRECTED.     omeprazole 20 MG capsule  Commonly known as:  PRILOSEC  TAKE 1 CAPSULE BY MOUTH ONCE DAILY.     PRESERVISION AREDS 2 Caps  Take by mouth.     vitamin C 100 MG tablet  Take 100 mg by mouth daily.     Vitamin D (Ergocalciferol) 50000 UNITS Caps capsule  Commonly known as:  DRISDOL  TAKE 1 CAPSULE BY MOUTH ONCE A MONTH.        Allergies:  Allergies  Allergen Reactions  . Macrodantin [Nitrofurantoin Macrocrystal]   . Penicillins     No past medical history on file.  Past Surgical History  Procedure Laterality Date  . Abdominal hysterectomy      Family History  Problem Relation Age of Onset  . Cancer Mother   . Diabetes Brother     Social History:  reports that she has never smoked. She has never used smokeless tobacco. Her alcohol and drug histories are not on file.  Review of Systems:  ROS  Symptoms of neuropathy are controlled with gabapentin  HYPOTHYROIDISM: She has had primary hypothyroidism for over 20 years. Her levels have been stable with 75 mcg levothyroxine 7.5 tablets per week, has been on this dose since 11/15   Lab Results  Component Value Date   FREET4 0.94 03/05/2015   FREET4 0.93 02/12/2014   FREET4 1.07  08/07/2013   TSH 1.41 03/05/2015   TSH 4.88* 08/15/2014   TSH 0.51 02/12/2014     HYPERLIPIDEMIA: The lipid abnormality consists of elevated LDL, taking lovastatin with good control.  Lab Results  Component Value Date   CHOL 132 12/03/2014   HDL 48.30 12/03/2014   LDLCALC 57 12/03/2014   TRIG 133.0 12/03/2014   CHOLHDL 3 12/03/2014    Has had mild vitamin D deficiency using the 50,000 units supplement monthly and her level is normal in 10/60  She has not had a bone density in several years and has been off Fosamax for at least 3-4 years  She is sleeping better with Remeron 7.5 mg and mood is better      Examination:   BP 136/70 mmHg  Pulse 64  Temp(Src) 98.5 F (36.9 C)  Resp 14  Ht 4\' 11"  (1.499 m)  Wt 127 lb 9.6 oz (57.879 kg)  BMI 25.76  kg/m2  SpO2 98%  Body mass index is 25.76 kg/(m^2).   No edema present  ASSESSMENT/ PLAN:   Diabetes type 2   The patient's diabetes control appears to be about the same with [name] 8%. See history of present illness for detailed discussion of current management, blood sugar patterns and problems identified Again blood sugars are difficult to control and she has continued marked variability in blood sugars at all times without any obvious reasons She did have high readings when her insulin probably was exposed to heat at the beach and mealtime control is somewhat better now Although her highest readings are after supper she is occasionally getting lower readings right after her evening meal also Lowest blood sugars are generally before and after lunch Hypoglycemia been relatively infrequent  Recommendations made today:  Continue Levemir unchanged but consider adding another unit in the evening For now will have her take it Levemir little earlier at suppertime which may help control readings late at night also Continue to adjust mealtime dose based on blood sugars, activity level and type of meal  DIABETIC neuropathy:  Symptomatically controlled with gabapentin  Situational depression and insomnia: She is still grieving and not get out of her depressed mood.  Since she is mostly complaining about insomnia she will use Remeron 7.5 mg at night instead of diphenhydramine She will also reduce her gabapentin by 1 capsule when taking Remeron  Hypertension: Her blood pressure is well-controlled  Osteopenia/vitamin D deficiency: She will continue taking vitamin D once a month Will schedule bone density  Influenza vaccine given   Patient Instructions  Take pm Levemir at supper   Counseling time on subjects discussed above is over 50% of total visit        Gricel Copen 07/11/2015, 12:18 PM

## 2015-07-11 NOTE — Telephone Encounter (Signed)
Patient called stating that she would like to have her bone density at the [facility] center   Thank you

## 2015-07-11 NOTE — Patient Instructions (Signed)
Take pm Levemir at supper

## 2015-07-16 NOTE — Telephone Encounter (Signed)
Please advise below and schedule for pt while I am out of the office

## 2015-07-16 NOTE — Telephone Encounter (Signed)
Pt wants the bone density to be scheduled on 12/29 about 10 am at the [facility] center

## 2015-07-16 NOTE — Telephone Encounter (Signed)
Appointment has been scheduled for 12/29 @ 10 a.m, patient is aware.

## 2015-10-02 ENCOUNTER — Other Ambulatory Visit: Payer: Medicare Other

## 2015-10-03 ENCOUNTER — Other Ambulatory Visit (INDEPENDENT_AMBULATORY_CARE_PROVIDER_SITE_OTHER): Payer: Medicare Other

## 2015-10-03 ENCOUNTER — Ambulatory Visit
Admission: RE | Admit: 2015-10-03 | Discharge: 2015-10-03 | Disposition: A | Discharge status: Home / Self Care | Payer: Medicare Other | Source: Ambulatory Visit | Attending: Endocrinology | Admitting: Endocrinology

## 2015-10-03 DIAGNOSIS — E1165 Type 2 diabetes mellitus with hyperglycemia: Secondary | ICD-10-CM

## 2015-10-03 DIAGNOSIS — E038 Other specified hypothyroidism: Secondary | ICD-10-CM

## 2015-10-03 DIAGNOSIS — Z794 Long term (current) use of insulin: Secondary | ICD-10-CM

## 2015-10-03 DIAGNOSIS — E063 Autoimmune thyroiditis: Secondary | ICD-10-CM

## 2015-10-03 DIAGNOSIS — IMO0002 Reserved for concepts with insufficient information to code with codable children: Secondary | ICD-10-CM

## 2015-10-03 DIAGNOSIS — M8580 Other specified disorders of bone density and structure, unspecified site: Secondary | ICD-10-CM

## 2015-10-03 DIAGNOSIS — E118 Type 2 diabetes mellitus with unspecified complications: Secondary | ICD-10-CM

## 2015-10-03 LAB — LIPID PANEL
Cholesterol: 157 mg/dL (ref 0–200)
HDL: 55.8 mg/dL (ref >39.00)
LDL Cholesterol: 66 mg/dL (ref 0–99)
NonHDL: 101.37
Total CHOL/HDL Ratio: 3
Triglycerides: 175 mg/dL — ABNORMAL HIGH (ref 0.0–149.0)
VLDL: 35 mg/dL (ref 0.0–40.0)

## 2015-10-03 LAB — BASIC METABOLIC PANEL
BUN: 20 mg/dL (ref 6–23)
CO2: 27 mEq/L (ref 19–32)
Calcium: 9.5 mg/dL (ref 8.4–10.5)
Chloride: 105 mEq/L (ref 96–112)
Creatinine, Ser: 0.77 mg/dL (ref 0.40–1.20)
GFR: 77 mL/min (ref >60.00)
Glucose, Bld: 143 mg/dL — ABNORMAL HIGH (ref 70–99)
Potassium: 3.7 mEq/L (ref 3.5–5.1)
Sodium: 139 mEq/L (ref 135–145)

## 2015-10-03 LAB — TSH: TSH: 2.62 u[IU]/mL (ref 0.35–4.50)

## 2015-10-03 LAB — HEMOGLOBIN A1C: Hgb A1c MFr Bld: 8.3 % — ABNORMAL HIGH (ref 4.6–6.5)

## 2015-10-10 ENCOUNTER — Ambulatory Visit (INDEPENDENT_AMBULATORY_CARE_PROVIDER_SITE_OTHER): Payer: Medicare Other | Admitting: Endocrinology

## 2015-10-10 ENCOUNTER — Encounter: Payer: Self-pay | Admitting: Endocrinology

## 2015-10-10 VITALS — BP 134/72 | HR 67 | Temp 97.5°F | Resp 14 | Ht 59.0 in | Wt 128.4 lb

## 2015-10-10 DIAGNOSIS — E1165 Type 2 diabetes mellitus with hyperglycemia: Secondary | ICD-10-CM

## 2015-10-10 DIAGNOSIS — M8580 Other specified disorders of bone density and structure, unspecified site: Secondary | ICD-10-CM

## 2015-10-10 DIAGNOSIS — Z794 Long term (current) use of insulin: Secondary | ICD-10-CM

## 2015-10-10 DIAGNOSIS — I10 Essential (primary) hypertension: Secondary | ICD-10-CM | POA: Diagnosis not present

## 2015-10-10 MED ORDER — INSULIN DEGLUDEC 100 UNIT/ML ~~LOC~~ SOPN: 15.0000 [IU] | PEN_INJECTOR | Freq: Every day | SUBCUTANEOUS | Status: DC

## 2015-10-10 NOTE — Patient Instructions (Addendum)
Levemir 8  units at 7 a.m.and 7 units in pm  Novolog 4 units at lunch and not 5  TRESIBA 15 UNITS IN AM ONCE DAILY and call if sugars getting out of range  Check blood sugars on waking up  Also check blood sugars about 2 hours after a meal and do this after different meals by rotation  Recommended blood sugar levels on waking up is 90-130 and about 2 hours after meal is 130-160  Please bring your blood sugar monitor to each visit, thank you

## 2015-10-10 NOTE — Progress Notes (Signed)
Patient ID: Jennifer Bailey, female   DOB: [birth year], 81 y.o.   MRN: [id number]  Reason for Appointment: Diabetes follow-up   History of Present Illness     Type 2 DIABETES MELITUS, date of diagnosis: 2003  She has been on insulin for several years but continues to have labile blood sugars despite using basal bolus regimen She is very compliant with her insulin at mealtimes and is consistent with the timing of her Levemir insulin twice a day Is taking small amounts of insulin and is not on any oral hypoglycemics, appears to be completely insulin-dependent Her diet is somewhat variable but she is usually having balanced meals and not eating out excessively      RECENT history:   Her [name] is about the same at 8.3 %  She appears to have the usual fluctuation of her blood sugars at various times Although she was told to continue the same Levemir insulin she has reversed her or needing any evening doses by mistake  Current blood sugar trends, management and problems identified:  She has had significant variability in her blood sugars except fasting  Blood sugars are markedly high around suppertime even though they are fairly good right after lunch  She has usually variability in her blood sugars at bedtime with overall high readings, this is despite her changing her Levemir to suppertime instead of bedtime  FASTING blood sugars are fairly good especially recently but has had a reading of 59 this week  No hypoglycemia during the day, may have had mild hyperglycemia with overcorrection of of 400+ reading at suppertime last month  She has not been as active recently  Usually takes only one unit of insulin at 7 blood sugars are high, rarely 2 units Novolog  Postprandial readings after breakfast are usually not high but does not check often; after lunch readings a low-normal frequently  She is trying to adjust her mealtime dose based on her meal size and blood sugar level and  will take 1 extra unit Novolog for blood sugars over 200       Insulin regimen: Levemir 6 units 7 a.m.--8 units 9 pm;  Mealtime coverage breakfast: 2-3, 5  lunch and 4-5 at supper       Proper timing of medications in relation to meals: Yes, will take NovoLog after eating a blood sugar low before meal.  Will take insulin with her when she is eating out .          Monitors blood glucose:  up to 4 times a day.    Glucometer: One Touch.          Blood Glucose readings from meter download:    Mean values apply above for all meters except median for One Touch  PRE-MEAL Fasting Lunch Dinner Bedtime Overall  Glucose range:  59-224   91-364   260-442   78-365    Mean/median:  112   219   368   218  154   POST-MEAL PC Breakfast PC Lunch PC Dinner  Glucose range:   77-261    Mean/median:   104      Hypoglycemia:  recently has had only a couple of low readings, lowest glucose was 59 fasting Symptoms with hypoglycemia: feels weeak and shaky Low sugars are treated with Soft drink or glucose tablets      Meals: 3 meals per day.  Usually has small portions. Eggs or waffle with protein at 7 am; has mid morning  snack. Dinner 5 pmMay rarely have hs snack   Evening meal usually balanced at 5 pm with 1-2 carbohydrates  Physical activity: exercise: walking a little           Dietician visit: Most recent: 2004            Wt Readings from Last 3 Encounters:  10/10/15 128 lb 6.4 oz (58.242 kg)  07/11/15 127 lb 9.6 oz (57.879 kg)  03/07/15 128 lb 3.2 oz (123456 kg)   Complications: are: Mild neuropathy controlled symptomatically  Lab Results  Component Value Date   HGBA1C 8.3* 10/03/2015   HGBA1C 8.0* 07/08/2015   HGBA1C 8.0* 03/05/2015   Lab Results  Component Value Date   MICROALBUR 0.7 03/05/2015   LDLCALC 66 10/03/2015   CREATININE 0.77 10/03/2015       LABS:  No visits with results within 1 Week(s) from this visit. Latest known visit with results is:  Lab on 10/03/2015    Component Date Value Ref Range Status  . Hgb [name] MFr Bld 10/03/2015 8.3* 4.6 - 6.5 % Final   Glycemic Control Guidelines for People with Diabetes:Non Diabetic:  <6%Goal of Therapy: <7%Additional Action Suggested:  >8%   . Sodium 10/03/2015 139  135 - 145 mEq/L Final  . Potassium 10/03/2015 3.7  3.5 - 5.1 mEq/L Final  . Chloride 10/03/2015 105  96 - 112 mEq/L Final  . CO2 10/03/2015 27  19 - 32 mEq/L Final  . Glucose, Bld 10/03/2015 143* 70 - 99 mg/dL Final  . BUN 10/03/2015 20  6 - 23 mg/dL Final  . Creatinine, Ser 10/03/2015 0.77  0.40 - 1.20 mg/dL Final  . Calcium 10/03/2015 9.5  8.4 - 10.5 mg/dL Final  . GFR 10/03/2015 77.00  >60.00 mL/min Final  . TSH 10/03/2015 2.62  0.35 - 4.50 uIU/mL Final  . Cholesterol 10/03/2015 157  0 - 200 mg/dL Final   ATP III Classification       Desirable:  < 200 mg/dL               Borderline High:  200 - 239 mg/dL          High:  > = 240 mg/dL  . Triglycerides 10/03/2015 175.0* 0.0 - 149.0 mg/dL Final   Normal:  <150 mg/dLBorderline High:  150 - 199 mg/dL  . HDL 10/03/2015 55.80  >39.00 mg/dL Final  . VLDL 10/03/2015 35.0  0.0 - 40.0 mg/dL Final  . LDL Cholesterol 10/03/2015 66  0 - 99 mg/dL Final  . Total CHOL/HDL Ratio 10/03/2015 3   Final                  Men          Women1/2 Average Risk     3.4          3.3Average Risk          5.0          4.42X Average Risk          9.6          7.13X Average Risk          15.0          11.0                      . NonHDL 10/03/2015 101.37   Final   NOTE:  Non-HDL goal should be 30 mg/dL higher than patient's LDL goal (i.e. LDL goal of <  70 mg/dL, would have non-HDL goal of < 100 mg/dL)      Medication List       This list is accurate as of: 10/10/15  1:12 PM.  Always use your most recent med list.               Alcohol Pads 70 % Pads  USE 4 PER DAY     aspirin 81 MG tablet  Take 81 mg by mouth daily.     BD PEN NEEDLE NANO U/F 32G X 4 MM Misc  Generic drug:  Insulin Pen Needle     Insulin Pen  Needle 32G X 4 MM Misc  Commonly known as:  BD PEN NEEDLE NANO U/F  USE AS DIRECTED FIVE TIMES A DAY.     diphenhydrAMINE 25 MG tablet  Commonly known as:  SOMINEX  Take 25 mg by mouth at bedtime as needed for sleep.     fish oil-omega-3 fatty acids 1000 MG capsule  Take 2 g by mouth daily. Two a day     FLUARIX QUADRIVALENT 0.5 ML injection  Generic drug:  influenza vac split quadrivalent PF     gabapentin 100 MG capsule  Commonly known as:  NEURONTIN  TAKE 3 TO 4 CAPSULES BY MOUTH AT BEDTIME.     glucose blood test strip  Commonly known as:  ONE TOUCH ULTRA TEST  Use as directed to check blood sugars 4 times per day dx code E11.65     LEVEMIR FLEXTOUCH 100 UNIT/ML Pen  Generic drug:  Insulin Detemir  INJECT 8 UNITS SUBCUTANEOUSLY IN THE MORNING AND 6 UNITS AT NIGHT.     levothyroxine 75 MCG tablet  Commonly known as:  SYNTHROID, LEVOTHROID  TAKE 1 TABLET BY MOUTH DAILY BEFORE BREAKFAST, EXCEPT TAKE 1 AND 1/2 TABLETS ON SUNDAY.     losartan 50 MG tablet  Commonly known as:  COZAAR  TAKE 1 TABLET BY MOUTH DAILY.     lovastatin 40 MG tablet  Commonly known as:  MEVACOR  TAKE 1 TABLET BY MOUTH ONCE DAILY WITH A MEAL.     mirtazapine 7.5 MG tablet  Commonly known as:  REMERON  Take 1 tablet (7.5 mg total) by mouth at bedtime.     NOVOLOG FLEXPEN 100 UNIT/ML FlexPen  Generic drug:  insulin aspart  INJECT 3 TO 5 UNITS SUBCUTANEOUSLY BEFORE MEALS AS DIRECTED.     omeprazole 20 MG capsule  Commonly known as:  PRILOSEC  TAKE 1 CAPSULE BY MOUTH ONCE DAILY.     PRESERVISION AREDS 2 Caps  Take by mouth.     vitamin C 100 MG tablet  Take 100 mg by mouth daily.     Vitamin D (Ergocalciferol) 50000 units Caps capsule  Commonly known as:  DRISDOL  TAKE 1 CAPSULE BY MOUTH ONCE A MONTH.        Allergies:  Allergies  Allergen Reactions  . Macrodantin [Nitrofurantoin Macrocrystal]   . Penicillins     No past medical history on file.  Past Surgical History    Procedure Laterality Date  . Abdominal hysterectomy      Family History  Problem Relation Age of Onset  . Cancer Mother   . Diabetes Brother     Social History:  reports that she has never smoked. She has never used smokeless tobacco. Her alcohol and drug histories are not on file.  Review of Systems:  ROS   HYPERTENSION:  this has been mild and treated with 50  mg losartan. Her blood pressure is well controlled without lightheadedness, she sometimes has systolic readings as low 0000000 at home without symptoms  Symptoms of neuropathy are controlled with gabapentin 200 mg at bedtime  HYPOTHYROIDISM: She has had primary hypothyroidism for over 20 years. Her levels have been stable with 75 mcg levothyroxine 7.5 tablets per week, has been on this dose since 11/15   Lab Results  Component Value Date   FREET4 0.94 03/05/2015   FREET4 0.93 02/12/2014   FREET4 1.07 08/07/2013   TSH 2.62 10/03/2015   TSH 1.41 03/05/2015   TSH 4.88* 08/15/2014     HYPERLIPIDEMIA: The lipid abnormality consists of elevated LDL, taking lovastatin with good control.  Lab Results  Component Value Date   CHOL 157 10/03/2015   HDL 55.80 10/03/2015   LDLCALC 66 10/03/2015   TRIG 175.0* 10/03/2015   CHOLHDL 3 10/03/2015    Has had mild vitamin D deficiency using the 50,000 units supplement monthly and her level is normal in 10/16  She has had a bone density recently showing mild osteopenia with T score 1.9 vs 2.3 in 2009 at the hip She has been off Fosamax for at least 3-4 years  She is sleeping well with Remeron 7.5 mg and no depression      Examination:   BP 100/64 mmHg  Pulse 67  Temp(Src) 97.5 F (36.4 C)  Resp 14  Ht 4\' 11"  (1.499 m)  Wt 128 lb 6.4 oz (58.242 kg)  BMI 25.92 kg/m2  SpO2 98%  Body mass index is 25.92 kg/(m^2).   No edema present  ASSESSMENT/ PLAN:   Diabetes type 2   The patient's diabetes control appears to be about the same with [name] 8.3. See history  of present illness for detailed discussion of current management, blood sugar patterns and problems identified Since she got confused about her Levemir dosage and is taking less in the morning her blood sugars are markedly increased at suppertime Blood sugars are progressively higher during the day except when she takes lunchtime coverage Also has mostly high readings after supper Fasting readings are however fairly good with 8 units of Levemir in the evening Hypoglycemia been relatively infrequent  Recommendations made today:  Continue Levemir until she finishes it but reduced the dose to 7 units in the evening and increase the morning dose to 8 units Discussed use of Tresiba which should be covered as well on her plan and she can start with 13 units once a day in the morning and adjusted further if fasting readings stay over 140, discussed in detail and given patient information brochure Reduce lunchtime coverage by 1 unit Continue to add extra 1-2 units for high readings at meal times Start back on walking  DIABETIC neuropathy: Symptomatically controlled with gabapentin  Hypothyroidism: Adequately replaced  Hypertension: Her blood pressure is well-controlled  Osteopenia/vitamin D deficiency: She will continue taking vitamin D once a month Bone density in 3 years   Counseling time on subjects discussed above is over 50% of today's 25 minute visit    Patient Instructions   Levemir 8  units at 7 a.m.and 7 units in pm  Novolog 4 units at lunch and not 5  TRESIBA 15 UNITS IN AM ONCE DAILY and call if sugars getting out of range  Check blood sugars on waking up  Also check blood sugars about 2 hours after a meal and do this after different meals by rotation  Recommended blood sugar levels on waking up  is 90-130 and about 2 hours after meal is 130-160  Please bring your blood sugar monitor to each visit, thank you          Jennifer Bailey 10/10/2015, 1:12 PM

## 2015-10-16 ENCOUNTER — Telehealth: Payer: Self-pay | Admitting: Endocrinology

## 2015-10-16 NOTE — Telephone Encounter (Signed)
Patient has question about her b/s log she do not have enough room to writ down all her readings, please advise

## 2015-10-16 NOTE — Telephone Encounter (Signed)
I called Jennifer Bailey back and told her to write her sugars down on a piece of paper if she does not have room in her log books.

## 2015-10-22 ENCOUNTER — Telehealth: Payer: Self-pay | Admitting: Endocrinology

## 2015-10-22 NOTE — Telephone Encounter (Signed)
Patient called with her blood sugar readings for the last 4 days, she said since she's started on the Tresiba her morning sugars have been getting very low. All sugars are fasting readings and all were taken between 7:30 and 8 am 01/14-  49  01/15-  66 01/16- 144 01/17-  73   Please advise.

## 2015-10-22 NOTE — Telephone Encounter (Signed)
Pt has been having very low sugar levels in AM and PM, please advise

## 2015-10-22 NOTE — Telephone Encounter (Signed)
Patient said she has reduced it back to 10 units.

## 2015-10-22 NOTE — Telephone Encounter (Signed)
Reduce the dose to 9 units

## 2015-10-22 NOTE — Telephone Encounter (Signed)
Reduce the dose from 15 down to 13 units

## 2015-10-22 NOTE — Telephone Encounter (Signed)
Noted, patient is aware. 

## 2015-11-04 ENCOUNTER — Other Ambulatory Visit: Payer: Self-pay | Admitting: Endocrinology

## 2015-11-19 ENCOUNTER — Other Ambulatory Visit: Payer: Self-pay

## 2015-11-19 DIAGNOSIS — Z1231 Encounter for screening mammogram for malignant neoplasm of breast: Secondary | ICD-10-CM

## 2015-12-16 ENCOUNTER — Other Ambulatory Visit: Payer: Self-pay | Admitting: *Deleted

## 2015-12-16 ENCOUNTER — Other Ambulatory Visit: Payer: Self-pay | Admitting: Endocrinology

## 2015-12-16 MED ORDER — GLUCOSE BLOOD VI STRP: ORAL_STRIP | Status: DC

## 2015-12-26 ENCOUNTER — Ambulatory Visit (INDEPENDENT_AMBULATORY_CARE_PROVIDER_SITE_OTHER): Payer: Medicare Other | Admitting: Endocrinology

## 2015-12-26 ENCOUNTER — Ambulatory Visit
Admission: RE | Admit: 2015-12-26 | Discharge: 2015-12-26 | Disposition: A | Discharge status: Home / Self Care | Payer: Medicare Other | Source: Ambulatory Visit

## 2015-12-26 ENCOUNTER — Encounter: Payer: Self-pay | Admitting: Endocrinology

## 2015-12-26 VITALS — BP 126/60 | HR 71 | Temp 98.6°F | Resp 14 | Ht 59.0 in | Wt 128.4 lb

## 2015-12-26 DIAGNOSIS — E1165 Type 2 diabetes mellitus with hyperglycemia: Secondary | ICD-10-CM

## 2015-12-26 DIAGNOSIS — E1142 Type 2 diabetes mellitus with diabetic polyneuropathy: Secondary | ICD-10-CM | POA: Diagnosis not present

## 2015-12-26 DIAGNOSIS — Z1231 Encounter for screening mammogram for malignant neoplasm of breast: Secondary | ICD-10-CM

## 2015-12-26 DIAGNOSIS — IMO0001 Reserved for inherently not codable concepts without codable children: Secondary | ICD-10-CM

## 2015-12-26 LAB — POCT GLYCOSYLATED HEMOGLOBIN (HGB A1C): Hemoglobin A1C: 6.9

## 2015-12-26 NOTE — Progress Notes (Signed)
Patient ID: Jennifer Bailey, female   DOB: 1935/03/12, 81 y.o.   MRN: [id number]  Reason for Appointment: Diabetes follow-up   History of Present Illness     Type 2 DIABETES MELITUS, date of diagnosis: 2003  She has been on insulin for several years but continues to have labile blood sugars despite using basal bolus regimen She is very compliant with her insulin at mealtimes and is consistent with the timing of her Levemir insulin twice a day Is taking small amounts of insulin and is not on any oral hypoglycemics, appears to be completely insulin-dependent Her diet is somewhat variable but she is usually having balanced meals and not eating out excessively      RECENT history:   Insulin regimen: Tresiba 12 units  Mealtime coverage breakfast: 2-3, 5  lunch and 3-4 at supper    She was changed from Levemir to [place] insulin in 1/17 because of relatively inconsistent control and [name] persistently over 8%. She is now coming back for follow-up  Current blood sugar trends, management and problems identified:   With switching to [place] she has had marked improvement in her blood sugar patterns including overall lower blood sugars throughout the day and less variability.  Although initially she had to reduce her dose from the starting amount of 13 units she is taking 12 units for the last 3 weeks or so based on her fasting blood sugars  She is not checking her blood sugars very much because of her insurance not allowing more than 3 times a day.  Her blood sugars are being checked consistently after supper and these are fairly good on an average but she'll showing some variability.  She has mostly good fasting readings recently with occasional significantly high readings are  Some of her high readings are related to rebound from low normal readings.  Blood sugars are generally fairly good midday and afternoon  Not clear why she had readings in the 200s for a couple of days  about 10 days ago  No significant hypoglycemia with only occasional readings in the 60s   She is trying to adjust her mealtime dose based on her meal size and blood sugar level and will take 1 extra unit Novolog for blood sugars over 200            Proper timing of medications in relation to meals: Yes, will take NovoLog after eating a blood sugar low before meal.  Will take insulin with her when she is eating out .          Monitors blood glucose:  up to 4 times a day.    Glucometer: One Touch.          Blood Glucose readings from meter download:    Mean values apply above for all meters except median for One Touch  PRE-MEAL Fasting Lunch Dinner Bedtime Overall  Glucose range: 65-322 66-292  68-254   Mean/median: 135 88  148 146    Hypoglycemia:  recently has had only a couple of low readings, lowest glucose was 59 fasting Symptoms with hypoglycemia: feels weeak and shaky Low sugars are treated with Soft drink or glucose tablets      Meals: 3 meals per day.  Usually has small portions. Eggs or waffle with protein at 7 am; has mid morning snack. Dinner 5 pmMay rarely have hs snack   Evening meal usually balanced at 5 pm with 1-2 carbohydrates  Physical activity: exercise: walking/ video  Dietician visit: Most recent: 2004            Wt Readings from Last 3 Encounters:  12/26/15 128 lb 6.4 oz (58.242 kg)  10/10/15 128 lb 6.4 oz (58.242 kg)  07/11/15 127 lb 9.6 oz (123456 kg)   Complications: are: Mild neuropathy controlled symptomatically  Lab Results  Component Value Date   HGBA1C 6.9 12/26/2015   HGBA1C 8.3* 10/03/2015   HGBA1C 8.0* 07/08/2015   Lab Results  Component Value Date   MICROALBUR 0.7 03/05/2015   LDLCALC 66 10/03/2015   CREATININE 0.77 10/03/2015       LABS:  Office Visit on 12/26/2015  Component Date Value Ref Range Status  . Hemoglobin [name] 12/26/2015 6.9   Final      Medication List       This list is accurate as of: 12/26/15  11:59 PM.  Always use your most recent med list.               Alcohol Pads 70 % Pads  USE 4 PER DAY     aspirin 81 MG tablet  Take 81 mg by mouth daily.     BD PEN NEEDLE NANO U/F 32G X 4 MM Misc  Generic drug:  Insulin Pen Needle     Insulin Pen Needle 32G X 4 MM Misc  Commonly known as:  BD PEN NEEDLE NANO U/F  USE AS DIRECTED FIVE TIMES A DAY.     fish oil-omega-3 fatty acids 1000 MG capsule  Take 2 g by mouth daily. Two a day     FLUARIX QUADRIVALENT 0.5 ML injection  Generic drug:  influenza vac split quadrivalent PF     gabapentin 100 MG capsule  Commonly known as:  NEURONTIN  TAKE 3 TO 4 CAPSULES BY MOUTH AT BEDTIME.     glucose blood test strip  Commonly known as:  ONE TOUCH ULTRA TEST  USE TO TEST BLOOD SUGAR 3 TIMES DAILY DX code E11.65     Insulin Degludec 100 UNIT/ML Sopn  Commonly known as:  TRESIBA FLEXTOUCH  Inject 15 Units into the skin daily.     levothyroxine 75 MCG tablet  Commonly known as:  SYNTHROID, LEVOTHROID  TAKE 1 TABLET BY MOUTH DAILY BEFORE BREAKFAST, EXCEPT TAKE 1 AND 1/2 TABLETS ON SUNDAY.     losartan 50 MG tablet  Commonly known as:  COZAAR  TAKE 1 TABLET BY MOUTH DAILY.     lovastatin 40 MG tablet  Commonly known as:  MEVACOR  TAKE 1 TABLET BY MOUTH ONCE DAILY WITH A MEAL.     mirtazapine 15 MG tablet  Commonly known as:  REMERON  TAKE 1/2 TABLET (7.5 MG TOTAL) BY MOUTH AT BEDTIME.     NOVOLOG FLEXPEN 100 UNIT/ML FlexPen  Generic drug:  insulin aspart  INJECT 3 TO 5 UNITS SUBCUTANEOUSLY BEFORE MEALS AS DIRECTED.     omeprazole 20 MG capsule  Commonly known as:  PRILOSEC  TAKE 1 CAPSULE BY MOUTH ONCE DAILY.     PRESERVISION AREDS 2 Caps  Take by mouth.     vitamin C 100 MG tablet  Take 100 mg by mouth daily.     Vitamin D (Ergocalciferol) 50000 units Caps capsule  Commonly known as:  DRISDOL  TAKE 1 CAPSULE BY MOUTH ONCE A MONTH.        Allergies:  Allergies  Allergen Reactions  . Macrodantin  [Nitrofurantoin Macrocrystal]   . Penicillins     No past medical history  on file.  Past Surgical History  Procedure Laterality Date  . Abdominal hysterectomy      Family History  Problem Relation Age of Onset  . Cancer Mother   . Diabetes Brother     Social History:  reports that she has never smoked. She has never used smokeless tobacco. Her alcohol and drug histories are not on file.  Review of Systems:  ROS   HYPERTENSION:  this has been mild and treated with 50 mg losartan. Her blood pressure is well controlled without lightheadedness, blood pressure readings 112-120 at home   Symptoms of neuropathy are controlled with gabapentin 200 mg at bedtime  HYPOTHYROIDISM: She has had primary hypothyroidism for over 20 years. Her levels have been stable with 75 mcg levothyroxine 7.5 tablets per week, has been on this dose since 11/15   Lab Results  Component Value Date   TSH 2.62 10/03/2015   TSH 1.41 03/05/2015   TSH 4.88* 08/15/2014   FREET4 0.94 03/05/2015   FREET4 0.93 02/12/2014   FREET4 1.07 08/07/2013     HYPERLIPIDEMIA: The lipid abnormality consists of elevated LDL, taking lovastatin with good control.  Lab Results  Component Value Date   CHOL 157 10/03/2015   HDL 55.80 10/03/2015   LDLCALC 66 10/03/2015   TRIG 175.0* 10/03/2015   CHOLHDL 3 10/03/2015    Has had mild vitamin D deficiency using the 50,000 units supplement monthly and her level is normal in 10/16  She has had a bone density recently showing mild osteopenia with T score 1.9 vs 2.3 in 2009 at the hip She has been off Fosamax for at least 3-4 years  She is sleeping well with Remeron 7.5 mg, she would like to continue this       Examination:   BP 126/60 mmHg  Pulse 71  Temp(Src) 98.6 F (37 C)  Resp 14  Ht 4\' 11"  (1.499 m)  Wt 128 lb 6.4 oz (58.242 kg)  BMI 25.92 kg/m2  SpO2 99%  Body mass index is 25.92 kg/(m^2).   No edema present  ASSESSMENT/ PLAN:   Diabetes type 2    See history of present illness for detailed discussion of current management, blood sugar patterns and problems identified The patient's diabetes control is significantly better with using [place] compared to Levemir. She is using overall less insulin including the basal. She has not checked her sugars as much as before especially before lunch and supper  Fasting readings are mostly near normal with some fluctuation, sometimes higher as a rebound from low normal sugars the night before possibly from overtreating Postprandial readings after supper are variable but overall fairly well controlled Hypoglycemia been relatively infrequent with blood sugars in the 60s at different times  Recommendations made today:  Continue Tresiba 12 units daily. Discussed possibly reducing the dose by 1 unit if morning readings are low normal Discussed checking blood sugars at a variety of times to help adjust her mealtime doses at breakfast and lunch She will reduce her Novolog or have an extra snack if planning to be very active especially with warmer weather coming  DIABETIC neuropathy: Symptomatically controlled with gabapentin However since her blood sugar control is better she may reduce her gabapentin 100 mg   Counseling time on subjects discussed above is over 50% of today's 25 minute visit    There are no Patient Instructions on file for this visit.       Jennifer Bailey 12/27/2015, 12:02 PM

## 2015-12-30 ENCOUNTER — Other Ambulatory Visit: Payer: Self-pay | Admitting: Endocrinology

## 2016-02-01 ENCOUNTER — Other Ambulatory Visit: Payer: Self-pay | Admitting: Endocrinology

## 2016-02-29 ENCOUNTER — Other Ambulatory Visit: Payer: Self-pay | Admitting: Endocrinology

## 2016-03-23 ENCOUNTER — Other Ambulatory Visit (INDEPENDENT_AMBULATORY_CARE_PROVIDER_SITE_OTHER): Payer: Medicare Other

## 2016-03-23 DIAGNOSIS — E1165 Type 2 diabetes mellitus with hyperglycemia: Secondary | ICD-10-CM

## 2016-03-23 DIAGNOSIS — IMO0001 Reserved for inherently not codable concepts without codable children: Secondary | ICD-10-CM

## 2016-03-23 LAB — COMPREHENSIVE METABOLIC PANEL
ALT: 11 U/L (ref 0–35)
AST: 16 U/L (ref 0–37)
Albumin: 3.7 g/dL (ref 3.5–5.2)
Alkaline Phosphatase: 68 U/L (ref 39–117)
BUN: 15 mg/dL (ref 6–23)
CO2: 23 mEq/L (ref 19–32)
Calcium: 9 mg/dL (ref 8.4–10.5)
Chloride: 105 mEq/L (ref 96–112)
Creatinine, Ser: 0.75 mg/dL (ref 0.40–1.20)
GFR: 79.27 mL/min (ref >60.00)
Glucose, Bld: 66 mg/dL — ABNORMAL LOW (ref 70–99)
Potassium: 3.8 mEq/L (ref 3.5–5.1)
Sodium: 138 mEq/L (ref 135–145)
Total Bilirubin: 0.5 mg/dL (ref 0.2–1.2)
Total Protein: 6.5 g/dL (ref 6.0–8.3)

## 2016-03-23 LAB — MICROALBUMIN / CREATININE URINE RATIO
Creatinine,U: 12.6 mg/dL
Microalb Creat Ratio: 5.6 mg/g (ref 0.0–30.0)
Microalb, Ur: <0.7 mg/dL (ref 0.0–1.9)

## 2016-03-23 LAB — LIPID PANEL
Cholesterol: 124 mg/dL (ref 0–200)
HDL: 50.5 mg/dL (ref >39.00)
LDL Cholesterol: 57 mg/dL (ref 0–99)
NonHDL: 73.59
Total CHOL/HDL Ratio: 2
Triglycerides: 82 mg/dL (ref 0.0–149.0)
VLDL: 16.4 mg/dL (ref 0.0–40.0)

## 2016-03-23 LAB — TSH: TSH: 1.01 u[IU]/mL (ref 0.35–4.50)

## 2016-03-23 LAB — T4, FREE: Free T4: 1.07 ng/dL (ref 0.60–1.60)

## 2016-03-23 LAB — HEMOGLOBIN A1C: Hgb A1c MFr Bld: 7.6 % — ABNORMAL HIGH (ref 4.6–6.5)

## 2016-03-30 ENCOUNTER — Ambulatory Visit (INDEPENDENT_AMBULATORY_CARE_PROVIDER_SITE_OTHER): Payer: Medicare Other | Admitting: Endocrinology

## 2016-03-30 ENCOUNTER — Encounter: Payer: Self-pay | Admitting: Endocrinology

## 2016-03-30 VITALS — BP 132/70 | HR 74 | Ht 59.0 in | Wt 132.0 lb

## 2016-03-30 DIAGNOSIS — Z0000 Encounter for general adult medical examination without abnormal findings: Secondary | ICD-10-CM

## 2016-03-30 DIAGNOSIS — E038 Other specified hypothyroidism: Secondary | ICD-10-CM

## 2016-03-30 DIAGNOSIS — E063 Autoimmune thyroiditis: Secondary | ICD-10-CM

## 2016-03-30 DIAGNOSIS — E1142 Type 2 diabetes mellitus with diabetic polyneuropathy: Secondary | ICD-10-CM | POA: Diagnosis not present

## 2016-03-30 DIAGNOSIS — I10 Essential (primary) hypertension: Secondary | ICD-10-CM | POA: Diagnosis not present

## 2016-03-30 DIAGNOSIS — E1165 Type 2 diabetes mellitus with hyperglycemia: Secondary | ICD-10-CM | POA: Diagnosis not present

## 2016-03-30 DIAGNOSIS — E7800 Pure hypercholesterolemia, unspecified: Secondary | ICD-10-CM

## 2016-03-30 DIAGNOSIS — M8580 Other specified disorders of bone density and structure, unspecified site: Secondary | ICD-10-CM

## 2016-03-30 DIAGNOSIS — R5383 Other fatigue: Secondary | ICD-10-CM

## 2016-03-30 DIAGNOSIS — Z794 Long term (current) use of insulin: Secondary | ICD-10-CM

## 2016-03-30 NOTE — Patient Instructions (Signed)
Increase Tresiba to 13 units. If the blood sugar is high before meal increase the NovoLog as follows:  Blood sugar 180-220: Add extra 1 unit 220-280:2 extra units Over 280 = 3 extra units  For blood sugars over 250 after supper add extra 2 units  Reduce the insulin at lunch or supper by 1 unit if planning to go out and be very active  Please bring a copy of your living will on the next visit

## 2016-03-30 NOTE — Progress Notes (Signed)
Patient ID: Jennifer Bailey, female   DOB: 05/16/1935, 81 y.o.   MRN: [id number]   Reason for Appointment:  Complete physical exam and multiple issues   History of Present Illness    Problem 1:     Type 2 DIABETES MELITUS, date of diagnosis: 2003  She has been on insulin for several years but continues to have labile blood sugars despite using basal bolus regimen She is very compliant with her insulin at mealtimes and is consistent with the timing of her Levemir insulin twice a day Is taking small amounts of insulin and is not on any oral hypoglycemics, appears to be completely insulin-dependent Her diet is somewhat variable but she is usually having balanced meals and not eating out excessively      RECENT history:   Insulin regimen: Tresiba 12 units;   mealtime coverage breakfast: 3-4, 4  lunch and 3-4 at supper    She was changed from Levemir to [place] insulin in 1/17 because of relatively inconsistent control and [name] persistently over 8%.  Current blood sugar trends, management and problems identified:   With switching to [place] she has had marked improvement in her blood sugar patterns including overall lower blood sugars throughout the day and less variability.  Although initially she had to reduce her dose from the starting amount of 13 units she is taking 12 units for the last 3 weeks or so based on her fasting blood sugars  She is not checking her blood sugars very much because of her insurance not allowing more than 3 times a day.  Her blood sugars are being checked consistently after supper and these are fairly good on an average but she'll showing some variability.  She has mostly good fasting readings recently with occasional significantly high readings are  Some of her high readings are related to rebound from low normal readings.  Blood sugars are generally fairly good midday and afternoon  Not clear why she had readings in the 200s for a couple  of days about 10 days ago  No significant hypoglycemia with only occasional readings in the 60s She is trying to adjust her mealtime dose based on her meal size and blood sugar level and will take 1 extra unit Novolog for blood sugars over 200          Proper timing of medications in relation to meals: Yes, will take NovoLog after eating a blood sugar low before meal.  Will take insulin with her when she is eating out .          Monitors blood glucose:  up to 4 times a day.    Glucometer: One Touch.          Blood Glucose readings from meter download:   Mean values apply above for all meters except median for One Touch  PRE-MEAL Fasting Lunch Dinner Bedtime Overall  Glucose range: 75-408   67-420  63-491 64-398    Mean/median:     173+/-93     Hypoglycemia:  recently, lowest glucose 63 Has only 4% readings below target  Symptoms with hypoglycemia: feels weeak and shaky Low sugars are treated with Soft drink or glucose tablets      Meals: 3 meals per day.  Usually has small portions. Eggs or waffle with protein at 7 am; has mid morning snack. Occ hs snack   Evening meal usually balanced at 5 pm with 1-2 carbohydrates Physical activity: exercise: walking 2/7 and some gardening  Dietician visit: Most recent: 2004            Wt Readings from Last 3 Encounters:  03/30/16 132 lb (59.875 kg)  12/26/15 128 lb 6.4 oz (58.242 kg)  10/10/15 128 lb 6.4 oz ([id number] kg)   Complications: are: Mild neuropathy controlled symptomatically  Lab Results  Component Value Date   HGBA1C 7.6* 03/23/2016   HGBA1C 6.9 12/26/2015   HGBA1C 8.3* 10/03/2015   Lab Results  Component Value Date   MICROALBUR <0.7 03/23/2016   LDLCALC 57 03/23/2016   CREATININE 0.75 03/23/2016     HYPERTENSION:  this has been mild and treated with 50 mg losartan. She says her blood pressure was below [id number] systolic and she is only taking a half tablet.  Now readings are about  [id number] at home  systolic   OSTEOPENIA:  She has had osteopenia with T score -2.3 at the hip in 3/09 and was started on Fosamax at that time.   This was stopped in 5/15. No history of fracture  follow-up bone density in 2016: DualFemur Neck Right 10/03/2015 T score -1.9  Has been given 50,000 units vitamin D every 4 weeks for vitamin D. Deficiency  Lab Results  Component Value Date   VD25OH 58.87 07/08/2015   VD25OH 48 02/12/2014      PREVENTIVE CARE:   Annual hemoccults:           5/15   Breast self exams:                             every 2 weeks  Colonoscopy/sigmoidoscopy  04/04/14  Mammograms:   3/17  Yearly flu vaccine:                      yes   Bone Density:   2016, T score -2.3  Calcium supplements:  taking  Tetanus booster:  ?   Zostavax: 2003 Pneumovax [place]: 2005 /2015   Diet: Generally low fat Exercise: Yard work, some indoor exercises or walking History of falls: None    LABS:  No visits with results within 1 Week(s) from this visit. Latest known visit with results is:  Lab on 03/23/2016  Component Date Value Ref Range Status  . Hgb [name] MFr Bld 03/23/2016 7.6* 4.6 - 6.5 % Final   Glycemic Control Guidelines for People with Diabetes:Non Diabetic:  <6%Goal of Therapy: <7%Additional Action Suggested:  >8%   . Sodium 03/23/2016 138  135 - 145 mEq/L Final  . Potassium 03/23/2016 3.8  3.5 - 5.1 mEq/L Final  . Chloride 03/23/2016 105  96 - 112 mEq/L Final  . CO2 03/23/2016 23  19 - 32 mEq/L Final  . Glucose, Bld 03/23/2016 66* 70 - 99 mg/dL Final  . BUN 03/23/2016 15  6 - 23 mg/dL Final  . Creatinine, Ser 03/23/2016 0.75  0.40 - 1.20 mg/dL Final  . Total Bilirubin 03/23/2016 0.5  0.2 - 1.2 mg/dL Final  . Alkaline Phosphatase 03/23/2016 68  39 - 117 U/L Final  . AST 03/23/2016 16  0 - 37 U/L Final  . ALT 03/23/2016 11  0 - 35 U/L Final  . Total Protein 03/23/2016 6.5  6.0 - 8.3 g/dL Final  . Albumin 03/23/2016 3.7  3.5 - 5.2 g/dL Final  . Calcium 03/23/2016 9.0  8.4  - 10.5 mg/dL Final  . GFR 03/23/2016 79.27  >60.00 mL/min Final  . Microalb, Ur 03/23/2016 <0.7  0.0 - 1.9 mg/dL Final  . Creatinine,U 03/23/2016 12.6   Final  . Microalb Creat Ratio 03/23/2016 5.6  0.0 - 30.0 mg/g Final  . Cholesterol 03/23/2016 124  0 - 200 mg/dL Final   ATP III Classification       Desirable:  < 200 mg/dL               Borderline High:  200 - 239 mg/dL          High:  > = 240 mg/dL  . Triglycerides 03/23/2016 82.0  0.0 - 149.0 mg/dL Final   Normal:  <150 mg/dLBorderline High:  150 - 199 mg/dL  . HDL 03/23/2016 50.50  >39.00 mg/dL Final  . VLDL 03/23/2016 16.4  0.0 - 40.0 mg/dL Final  . LDL Cholesterol 03/23/2016 57  0 - 99 mg/dL Final  . Total CHOL/HDL Ratio 03/23/2016 2   Final                  Men          Women1/2 Average Risk     3.4          3.3Average Risk          5.0          4.42X Average Risk          9.6          7.13X Average Risk          15.0          11.0                      . NonHDL 03/23/2016 73.59   Final   NOTE:  Non-HDL goal should be 30 mg/dL higher than patient's LDL goal (i.e. LDL goal of < 70 mg/dL, would have non-HDL goal of < 100 mg/dL)  . TSH 03/23/2016 1.01  0.35 - 4.50 uIU/mL Final  . Free T4 03/23/2016 1.07  0.60 - 1.60 ng/dL Final      Medication List       This list is accurate as of: 03/30/16 11:59 PM.  Always use your most recent med list.               Alcohol Pads 70 % Pads  USE 4 PER DAY     aspirin 81 MG tablet  Take 81 mg by mouth daily.     fish oil-omega-3 fatty acids 1000 MG capsule  Take 2 g by mouth daily. Two a day     FLUARIX QUADRIVALENT 0.5 ML injection  Generic drug:  influenza vac split quadrivalent PF     gabapentin 100 MG capsule  Commonly known as:  NEURONTIN  TAKE 3 TO 4 CAPSULES BY MOUTH AT BEDTIME.     glucose blood test strip  Commonly known as:  ONE TOUCH ULTRA TEST  USE TO TEST BLOOD SUGAR 3 TIMES DAILY DX code E11.65     Insulin Degludec 100 UNIT/ML Sopn  Commonly known as:  TRESIBA  FLEXTOUCH  Inject 15 Units into the skin daily.     Insulin Pen Needle 32G X 4 MM Misc  Commonly known as:  BD PEN NEEDLE NANO U/F  USE AS DIRECTED FIVE TIMES A DAY.     BD PEN NEEDLE NANO U/F 32G X 4 MM Misc  Generic drug:  Insulin Pen Needle  USE AS DIRECTED FIVE TIMES A DAY.     levothyroxine 75 MCG tablet  Commonly  known as:  SYNTHROID, LEVOTHROID  TAKE 1 TABLET BY MOUTH DAILY BEFORE BREAKFAST, EXCEPT TAKE 1 AND 1/2 TABLETS ON SUNDAY.     losartan 50 MG tablet  Commonly known as:  COZAAR  TAKE 1 TABLET BY MOUTH DAILY.     lovastatin 40 MG tablet  Commonly known as:  MEVACOR  TAKE 1 TABLET BY MOUTH ONCE DAILY WITH A MEAL.     mirtazapine 15 MG tablet  Commonly known as:  REMERON  TAKE 1/2 TABLET (7.5 MG TOTAL) BY MOUTH AT BEDTIME.     NOVOLOG FLEXPEN 100 UNIT/ML FlexPen  Generic drug:  insulin aspart  INJECT 3 TO 5 UNITS SUBCUTANEOUSLY BEFORE MEALS AS DIRECTED.     omeprazole 20 MG capsule  Commonly known as:  PRILOSEC  TAKE 1 CAPSULE BY MOUTH ONCE DAILY.     PRESERVISION AREDS 2 Caps  Take by mouth.     vitamin C 100 MG tablet  Take 100 mg by mouth daily.     Vitamin D (Ergocalciferol) 50000 units Caps capsule  Commonly known as:  DRISDOL  TAKE 1 CAPSULE BY MOUTH ONCE A MONTH.        Allergies:  Allergies  Allergen Reactions  . Macrodantin [Nitrofurantoin Macrocrystal]   . Penicillins     No past medical history on file.  Past Surgical History  Procedure Laterality Date  . Abdominal hysterectomy      Family History  Problem Relation Age of Onset  . Cancer Mother   . Diabetes Brother     Social History:  reports that she has never smoked. She has never used smokeless tobacco. Her alcohol and drug histories are not on file.  Review of Systems:  Review of Systems   Constitutional: Negative for Weight loss.       She is complaining about fatigue.  However is able to do some physical activity such as mowing the lawn  Wt Readings from Last 3  Encounters:  03/30/16 132 lb (59.875 kg)  12/26/15 128 lb 6.4 oz (58.242 kg)  10/10/15 128 lb 6.4 oz (58.242 kg)   Eyes: Negative for blurred vision.  Respiratory: Negative for cough and shortness of breath.   Cardiovascular: Negative for chest pain, palpitations and leg swelling.  Gastrointestinal: Negative for nausea and abdominal pain.       Rarely will have Constipation, no diarrhea now Has history of chronic reflux controlled with Prilosec  Genitourinary: Negative for frequency.       Nocturia only once.  Has minimal incontinence and wears a pad  Musculoskeletal: Negative for myalgias, back pain and joint pain.  Skin: Negative for rash.  Neurological: Negative for tingling and headaches.       No numbness in hands or feet She has had burning in her feet and occasional pain in her legs.  She takes 2 gabapentin at night to relieve symptoms  Endo/Heme/Allergies: Negative for polydipsia.       No heat or cold intolerance.  Psychiatric/Behavioral: Negative for memory loss.    Her moods are good.  Taking small dose of Remeron to help her sleep  She had a positive Hemoccult but colonoscopy showed only hemorrhoids in 7/15   Lab Results  Component Value Date   WBC 9.6 03/05/2015   HGB 12.7 03/05/2015   HCT 38.5 03/05/2015   MCV 91.1 03/05/2015   PLT 282.0 03/05/2015    HYPOTHYROIDISM: She has had primary hypothyroidism for over 20 years. Her levels have been stable with 75 mcg  levothyroxine 7.5 tablets per week, has been on this dose since 11/15 Has some tiredness but her thyroid levels have been normal and stable  Lab Results  Component Value Date   FREET4 1.07 03/23/2016   FREET4 0.94 03/05/2015   FREET4 0.93 02/12/2014   TSH 1.01 03/23/2016   TSH 2.62 10/03/2015   TSH 1.41 03/05/2015     HYPERLIPIDEMIA: The lipid abnormality consists of elevated LDL,  taking lovastatin with good control.  Lab Results  Component Value Date   CHOL 124 03/23/2016   HDL 50.50  03/23/2016   LDLCALC 57 03/23/2016   TRIG 82.0 03/23/2016   CHOLHDL 2 03/23/2016        Examination:   BP 132/70 mmHg  Pulse 74  Ht 4\' 11"  (1.499 m)  Wt 132 lb (59.875 kg)  BMI 26.65 kg/m2  SpO2 96%  Body mass index is 26.65 kg/(m^2).    GENERAL: Has a small build  No pallor, clubbing, lymphadenopathy or edema.    EYES:  Externally normal.  Fundii:  normal discs and vessels   ENT: Mouth & pharynx normal appearance.  THYROID:  Not palpable.  CAROTIDS:  Normal character; no bruit.  BREAST exam: No mass palpable on either side.   HEART:  Normal apex, S1 and S2; no murmur or click.  CHEST:  Normal shape.  Lungs:  Vescicular breath sounds heard equally.  No crepitations/ wheeze.  ABDOMEN:  No distention.  Liver and spleen not palpable.  No other mass or tenderness.  No abnormal pulsation in epigastrium.  No epigastric bruit  RECTAL exam:  not indicated  NEUROLOGICAL: She quickly performs get up and go test She has a mild resting tremor of her head and mild hesitancy in her voice Gait is normal Motor power grossly normal in all 4 extremities Vibration sense at toes is moderately reduced.  Reflexes are 1+ at left ankle and difficult to elicit on the right Biceps reflexes are brisk  Diabetic Foot Exam - Simple   Simple Foot Form  Diabetic Foot exam was performed with the following findings:  Yes 03/30/2016 10:53 AM  Visual Inspection  No deformities, no ulcerations, no other skin breakdown bilaterally:  Yes  Sensation Testing  Intact to touch and monofilament testing bilaterally:  Yes  Pulse Check  Posterior Tibialis and Dorsalis pulse intact bilaterally:  Yes  See comments:  Yes  Comments  Dorsalis pedis on the left side not palpable      SPINE AND JOINTS:  minimal osteoarthritis in hands.  Skin:  no rash. Has multiple benign brownish pigmented lesions on back  Has abrasion of right lower leg, healing  ASSESSMENT/ PLAN:   Diabetes type 2   The patient's  diabetes control appears to be about the same with [name] 7.6% although has been better especially with her switching to [place]. See history of present illness for detailed discussion of current management, blood sugar patterns and problems identified  Difficult to control her diabetes because of fatigue and variability in her blood sugars   Her blood sugars are overall higher than on her last visit and not clear why Most of her hyperglycemia is unexpected and  Not explained with dietary changes She is generally eating balanced meals and avoiding excess carbohydrates and fat Does not tend to get hypoglycemia with any activity  Her blood sugars are not getting low as much also  Recommendations made today:   Increase Tresiba by 1 unit since her fasting readings are mostly high  She will be given correction doses for high readings both at mealtime and bedtime Continue Levemir unchanged as she does not have any consistent patterns before breakfast or supper  DIABETIC neuropathy: Symptomatically controlled with gabapentin 200 mg hs  History of colon polyps: She needs to have a Hemoccult done today  Hyperlipidemia: Well controlled  Hypertension: Her blood pressure is well-controlled with only 25 mg losartan EKG to be done today  Hypothyroidism :her TSH is normal with current regimen of 75 g, 7-1/2 tablets a week  Osteopenia/vitamin D deficiency: Bone density has been stable, has previously taken bisphosphonate drugs for several years  She will continue taking vitamin D monthly and will have follow-up level done on the next visit  Discussed general preventive care measures, she will bring a copy of her living will to add to the record Continue to get regular mammograms and she will discuss when she needs colonoscopy with gastroenterologist    Patient Instructions  Increase Tresiba to 13 units. If the blood sugar is high before meal increase the NovoLog as follows:  Blood sugar 180-220:  Add extra 1 unit 220-280:2 extra units Over 280 = 3 extra units  For blood sugars over 250 after supper add extra 2 units  Reduce the insulin at lunch or supper by 1 unit if planning to go out and be very active  Please bring a copy of your living will on the next visit    Counseling time on subjects discussed above is over 50% of today's evaluation and management portion      [facility] 03/31/2016, 11:31 AM

## 2016-04-20 ENCOUNTER — Other Ambulatory Visit: Payer: Self-pay

## 2016-04-20 DIAGNOSIS — Z1211 Encounter for screening for malignant neoplasm of colon: Secondary | ICD-10-CM

## 2016-04-22 ENCOUNTER — Other Ambulatory Visit (INDEPENDENT_AMBULATORY_CARE_PROVIDER_SITE_OTHER): Payer: Medicare Other

## 2016-04-22 DIAGNOSIS — Z1211 Encounter for screening for malignant neoplasm of colon: Secondary | ICD-10-CM

## 2016-04-22 LAB — FECAL OCCULT BLOOD, IMMUNOCHEMICAL: Fecal Occult Bld: NEGATIVE

## 2016-04-28 NOTE — Progress Notes (Signed)
Please let patient know that the stool Hemoccult result is normal and no further action needed

## 2016-05-01 ENCOUNTER — Telehealth: Payer: Self-pay | Admitting: Endocrinology

## 2016-05-01 NOTE — Telephone Encounter (Signed)
Patient is returning your call.  

## 2016-05-04 NOTE — Telephone Encounter (Signed)
PT stated she was returning your phone call.

## 2016-05-05 NOTE — Telephone Encounter (Signed)
I contacted the pt and advised of Stool Card results from 04/30/2016. Pt was advise per Dr. Dwyane Dee the results were normal and no further action was needed at this time. Pt voiced understanding.

## 2016-06-02 ENCOUNTER — Other Ambulatory Visit: Payer: Self-pay | Admitting: Endocrinology

## 2016-06-22 ENCOUNTER — Other Ambulatory Visit: Payer: Medicare Other

## 2016-06-25 ENCOUNTER — Other Ambulatory Visit (INDEPENDENT_AMBULATORY_CARE_PROVIDER_SITE_OTHER): Payer: Medicare Other

## 2016-06-25 DIAGNOSIS — M8580 Other specified disorders of bone density and structure, unspecified site: Secondary | ICD-10-CM

## 2016-06-25 DIAGNOSIS — R5383 Other fatigue: Secondary | ICD-10-CM

## 2016-06-25 DIAGNOSIS — E1142 Type 2 diabetes mellitus with diabetic polyneuropathy: Secondary | ICD-10-CM | POA: Diagnosis not present

## 2016-06-25 LAB — CBC
HCT: 34.8 % — ABNORMAL LOW (ref 36.0–46.0)
Hemoglobin: 11.7 g/dL — ABNORMAL LOW (ref 12.0–15.0)
MCHC: 33.7 g/dL (ref 30.0–36.0)
MCV: 89 fl (ref 78.0–100.0)
Platelets: 249 10*3/uL (ref 150.0–400.0)
RBC: 3.91 Mil/uL (ref 3.87–5.11)
RDW: 14.7 % (ref 11.5–15.5)
WBC: 8.2 10*3/uL (ref 4.0–10.5)

## 2016-06-25 LAB — BASIC METABOLIC PANEL
BUN: 20 mg/dL (ref 6–23)
CO2: 28 mEq/L (ref 19–32)
Calcium: 9.1 mg/dL (ref 8.4–10.5)
Chloride: 105 mEq/L (ref 96–112)
Creatinine, Ser: 0.84 mg/dL (ref 0.40–1.20)
GFR: 69.51 mL/min (ref >60.00)
Glucose, Bld: 96 mg/dL (ref 70–99)
Potassium: 4 mEq/L (ref 3.5–5.1)
Sodium: 138 mEq/L (ref 135–145)

## 2016-06-25 LAB — HEMOGLOBIN A1C: Hgb A1c MFr Bld: 6.9 % — ABNORMAL HIGH (ref 4.6–6.5)

## 2016-06-25 LAB — VITAMIN D 25 HYDROXY (VIT D DEFICIENCY, FRACTURES): VITD: 50.84 ng/mL (ref 30.00–100.00)

## 2016-06-29 ENCOUNTER — Ambulatory Visit (INDEPENDENT_AMBULATORY_CARE_PROVIDER_SITE_OTHER): Payer: Medicare Other | Admitting: Endocrinology

## 2016-06-29 ENCOUNTER — Encounter: Payer: Self-pay | Admitting: Endocrinology

## 2016-06-29 ENCOUNTER — Telehealth: Payer: Self-pay | Admitting: Endocrinology

## 2016-06-29 VITALS — BP 122/68 | HR 68 | Temp 98.0°F | Resp 14 | Ht 59.0 in | Wt 133.8 lb

## 2016-06-29 DIAGNOSIS — Z23 Encounter for immunization: Secondary | ICD-10-CM | POA: Diagnosis not present

## 2016-06-29 DIAGNOSIS — E1142 Type 2 diabetes mellitus with diabetic polyneuropathy: Secondary | ICD-10-CM

## 2016-06-29 DIAGNOSIS — R5383 Other fatigue: Secondary | ICD-10-CM | POA: Diagnosis not present

## 2016-06-29 DIAGNOSIS — D638 Anemia in other chronic diseases classified elsewhere: Secondary | ICD-10-CM | POA: Diagnosis not present

## 2016-06-29 DIAGNOSIS — E1165 Type 2 diabetes mellitus with hyperglycemia: Secondary | ICD-10-CM | POA: Diagnosis not present

## 2016-06-29 DIAGNOSIS — Z794 Long term (current) use of insulin: Secondary | ICD-10-CM

## 2016-06-29 DIAGNOSIS — E063 Autoimmune thyroiditis: Secondary | ICD-10-CM

## 2016-06-29 DIAGNOSIS — E038 Other specified hypothyroidism: Secondary | ICD-10-CM

## 2016-06-29 LAB — TSH: TSH: 1.39 u[IU]/mL (ref 0.35–4.50)

## 2016-06-29 LAB — IBC PANEL
Iron: 71 ug/dL (ref 42–145)
Saturation Ratios: 24.7 % (ref 20.0–50.0)
Transferrin: 205 mg/dL — ABNORMAL LOW (ref 212.0–360.0)

## 2016-06-29 LAB — VITAMIN B12: Vitamin B-12: 315 pg/mL (ref 211–911)

## 2016-06-29 LAB — T4, FREE: Free T4: 0.99 ng/dL (ref 0.60–1.60)

## 2016-06-29 NOTE — Progress Notes (Signed)
Please let patient know that the lab result is normal and no further action needed

## 2016-06-29 NOTE — Progress Notes (Signed)
Patient ID: Jennifer Bailey, female   DOB: [birth year], 81 y.o.   MRN: [id number]   Reason for Appointment:  Follow-up of multiple issues   History of Present Illness    Problem 1:     Type 2 DIABETES MELITUS, date of diagnosis: 2003  She has been on insulin for several years but continues to have labile blood sugars despite using basal bolus regimen She is very compliant with her insulin at mealtimes and is consistent with the timing of her Levemir insulin twice a day Is taking small amounts of insulin and is not on any oral hypoglycemics, appears to be completely insulin-dependent Her diet is somewhat variable but she is usually having balanced meals and not eating out excessively      RECENT history:   Insulin regimen: Tresiba 13 units;   mealtime coverage breakfast: 3-4, 4  lunch and 3-4 at supper    She was changed from Levemir to [place] insulin in 1/17 because of relatively inconsistent control and [name] persistently over 8%.  Current blood sugar trends, management and problems identified:   Her blood sugars are overall lower compared to last visit and has significantly less variability  Her Tyler Aas was increased by only one unit on the last visit  Also she has continued to take about the same amount of NovoLog before meals  She is checking blood sugars mostly at bedtime on a regular basis and periodically at other times since she is not able to get enough testing supplies from her insurance company  Again will get  Sporadic readings over 200 and a couple of times over 300 for no apparent reason  FASTING blood sugars are mostly consistent except for one high reading  No significant hypoglycemia with only occasional readings in the 60s She is trying to adjust her mealtime dose based on her meal size and blood sugar level and will take 1 extra unit Novolog for blood sugars over 200       Proper timing of medications in relation to meals: Yes.  Will take insulin  with her when she is eating out .          Monitors blood glucose:  3 times a day.    Glucometer: One Touch.          Blood Glucose readings from meter download:   Mean values apply above for all meters except median for One Touch  PRE-MEAL Fasting Lunch Dinner Bedtime Overall  Glucose range: 67-212  98-183  76-347  68-290    Mean/median:  167   105  119+/-70    POST-MEAL PC Breakfast PC Lunch PC Dinner  Glucose range: 66-340  88-211    Mean/median:  128      Symptoms with hypoglycemia: feels weeak and shaky Low sugars are treated with Soft drink or glucose tablets      Meals: 3 meals per day.  Usually has small portions. Eggs or waffle with protein at 7 am; has mid morning snack. Occ hs snack   Evening meal usually balanced at 5 pm with 1-2 carbohydrates Physical activity: exercise: walking 2/7 and some gardening           Dietician visit: Most recent: 2004            Wt Readings from Last 3 Encounters:  06/29/16 133 lb 12.8 oz (60.7 kg)  03/30/16 132 lb (59.9 kg)  12/26/15 128 lb 6.4 oz (123XX123 kg)   Complications: are: Mild neuropathy controlled  symptomatically  Lab Results  Component Value Date   HGBA1C 6.9 (H) 06/25/2016   HGBA1C 7.6 (H) 03/23/2016   HGBA1C 6.9 12/26/2015   Lab Results  Component Value Date   MICROALBUR <0.7 03/23/2016   LDLCALC 57 03/23/2016   CREATININE 0.84 06/25/2016     HYPERTENSION:  this has been mild and treated with a half of 50 mg losartan  Vitamin D. Deficiency.  She is on a maintenance dose with 50,000 units monthly with stable results:  Lab Results  Component Value Date   VD25OH 50.84 06/25/2016   VD25OH 58.87 07/08/2015      LABS:  Lab on 06/25/2016  Component Date Value Ref Range Status  . Hgb [name] MFr Bld 06/25/2016 6.9* 4.6 - 6.5 % Final  . Sodium 06/25/2016 138  135 - 145 mEq/L Final  . Potassium 06/25/2016 4.0  3.5 - 5.1 mEq/L Final  . Chloride 06/25/2016 105  96 - 112 mEq/L Final  . CO2 06/25/2016 28  19 - 32  mEq/L Final  . Glucose, Bld 06/25/2016 96  70 - 99 mg/dL Final  . BUN 06/25/2016 20  6 - 23 mg/dL Final  . Creatinine, Ser 06/25/2016 0.84  0.40 - 1.20 mg/dL Final  . Calcium 06/25/2016 9.1  8.4 - 10.5 mg/dL Final  . GFR 06/25/2016 69.51  >60.00 mL/min Final  . WBC 06/25/2016 8.2  4.0 - 10.5 K/uL Final  . RBC 06/25/2016 3.91  3.87 - 5.11 Mil/uL Final  . Platelets 06/25/2016 249.0  150.0 - 400.0 K/uL Final  . Hemoglobin 06/25/2016 11.7* 12.0 - 15.0 g/dL Final  . HCT 06/25/2016 34.8* 36.0 - 46.0 % Final  . MCV 06/25/2016 89.0  78.0 - 100.0 fl Final  . MCHC 06/25/2016 33.7  30.0 - 36.0 g/dL Final  . RDW 06/25/2016 14.7  11.5 - 15.5 % Final  . VITD 06/25/2016 50.84  30.00 - 100.00 ng/mL Final      Medication List       Accurate as of 06/29/16 12:25 PM. Always use your most recent med list.          Alcohol Pads 70 % Pads USE 4 PER DAY   aspirin 81 MG tablet Take 81 mg by mouth daily.   fish oil-omega-3 fatty acids 1000 MG capsule Take 2 g by mouth daily. Two a day   FLUARIX QUADRIVALENT 0.5 ML injection Generic drug:  influenza vac split quadrivalent PF   gabapentin 100 MG capsule Commonly known as:  NEURONTIN TAKE 3 TO 4 CAPSULES BY MOUTH AT BEDTIME.   glucose blood test strip Commonly known as:  ONE TOUCH ULTRA TEST USE TO TEST BLOOD SUGAR 3 TIMES DAILY DX code E11.65   Insulin Pen Needle 32G X 4 MM Misc Commonly known as:  BD PEN NEEDLE NANO U/F USE AS DIRECTED FIVE TIMES A DAY.   BD PEN NEEDLE NANO U/F 32G X 4 MM Misc Generic drug:  Insulin Pen Needle USE AS DIRECTED FIVE TIMES A DAY.   levothyroxine 75 MCG tablet Commonly known as:  SYNTHROID, LEVOTHROID TAKE 1 TABLET BY MOUTH DAILY BEFORE BREAKFAST, EXCEPT TAKE 1 AND 1/2 TABLETS ON SUNDAY.   losartan 50 MG tablet Commonly known as:  COZAAR TAKE 1 TABLET BY MOUTH DAILY.   lovastatin 40 MG tablet Commonly known as:  MEVACOR TAKE 1 TABLET BY MOUTH ONCE DAILY WITH A MEAL.   mirtazapine 15 MG  tablet Commonly known as:  REMERON TAKE 1/2 TABLET (7.5 MG TOTAL) BY MOUTH  AT BEDTIME.   NOVOLOG FLEXPEN 100 UNIT/ML FlexPen Generic drug:  insulin aspart INJECT 3 TO 5 UNITS SUBCUTANEOUSLY BEFORE MEALS AS DIRECTED.   omeprazole 20 MG capsule Commonly known as:  PRILOSEC TAKE 1 CAPSULE BY MOUTH ONCE DAILY.   PRESERVISION AREDS 2 Caps Take by mouth.   TRESIBA FLEXTOUCH 100 UNIT/ML Sopn FlexTouch Pen Generic drug:  insulin degludec INJECT 15 UNITS INTO THE SKIN DAILY.   vitamin C 100 MG tablet Take 100 mg by mouth daily.   Vitamin D (Ergocalciferol) 50000 units Caps capsule Commonly known as:  DRISDOL TAKE 1 CAPSULE BY MOUTH ONCE A MONTH.       Allergies:  Allergies  Allergen Reactions  . Macrodantin [Nitrofurantoin Macrocrystal]   . Penicillins     No past medical history on file.  Past Surgical History:  Procedure Laterality Date  . ABDOMINAL HYSTERECTOMY      Family History  Problem Relation Age of Onset  . Cancer Mother   . Diabetes Brother     Social History:  reports that she has never smoked. She has never used smokeless tobacco. Her alcohol and drug histories are not on file.  Review of Systems:        She is complaining about fatigue again.     Anemia: This is relatively new and has not been evaluated before  Lab Results  Component Value Date   WBC 8.2 06/25/2016   HGB 11.7 (L) 06/25/2016   HCT 34.8 (L) 06/25/2016   MCV 89.0 06/25/2016   PLT 249.0 06/25/2016   Lab Results  Component Value Date   OCCULTBLD Negative 04/22/2016   OCCULTBLD Positive (A) 02/12/2014    HYPOTHYROIDISM: She has had primary hypothyroidism for over 20 years. Her levels have been stable with 75 mcg levothyroxine 7.5 tablets per week, has been on this dose since 11/15 Has some tiredness but her thyroid levels have been normal and stable  Lab Results  Component Value Date   FREET4 1.07 03/23/2016   FREET4 0.94 03/05/2015   FREET4 0.93 02/12/2014   TSH 1.01  03/23/2016   TSH 2.62 10/03/2015   TSH 1.41 03/05/2015     HYPERLIPIDEMIA: The lipid abnormality consists of elevated LDL,  taking lovastatin with good control.  Lab Results  Component Value Date   CHOL 124 03/23/2016   HDL 50.50 03/23/2016   LDLCALC 57 03/23/2016   TRIG 82.0 03/23/2016   CHOLHDL 2 03/23/2016        Examination:   BP 122/68   Pulse 68   Temp 98 F (36.7 C)   Resp 14   Ht 4\' 11"  (1.499 m)   Wt 133 lb 12.8 oz (60.7 kg)   SpO2 98%   BMI 27.02 kg/m   Body mass index is 27.02 kg/m.    ASSESSMENT/ PLAN:   Diabetes type 2 On insulin    See history of present illness for detailed discussion of current management, blood sugar patterns and problems identified  Her blood sugars are much less variable since her last visit especially in the last 2 weeks download Also [name] is back below 7% She is concerned about difficulty losing weight Also concerned about low normal blood sugars at night sometimes No significant hypoglycemia with occasional 60s fasting and late evening Overall still requiring rather than small doses of insulin, both basal and bolus  She is generally eating balanced meals and trying to be active  Recommendations made today:   Reduce Tresiba by 1 unit since her  fasting readings are mostly around normal or slightly low Reduce suppertime dose by 1 unit also Discussed hypoglycemia treatment and prevention May have bedtime snack especially if blood sugars are near normal  DIABETIC neuropathy: Symptomatically controlled with gabapentin 200 mg hs, may take Tylenol as needed also  Hyperlipidemia: Well controlled  ANEMIA: This is mild but a new problem now.  Hemoccult negative.  Needs evaluation of iron and B12  Hypertension: Her blood pressure is well-controlled with only 25 mg losartan   Hypothyroidism :her TSH will be rechecked  Osteopenia/vitamin D deficiency: Her vitamin D level is therapeutic      Patient Instructions  Tresiba 12  units  Reduce supper dose by 1 unit  Counseling time on subjects discussed above is over 50% of today's 25 minute visit      Ellenie Salome 06/29/2016, 12:25 PM

## 2016-06-29 NOTE — Patient Instructions (Addendum)
Tresiba 12 units  Reduce supper dose by 1 unit

## 2016-08-05 ENCOUNTER — Telehealth: Payer: Self-pay | Admitting: Endocrinology

## 2016-08-05 NOTE — Telephone Encounter (Signed)
Patient stated that it burns when she use the bathroom she think she has a bladder infection or UTI. send medication to   [place], [place] - [place] [phone] (Phone) [phone] (Fax)

## 2016-08-07 MED ORDER — NYSTATIN-TRIAMCINOLONE 100000-0.1 UNIT/GM-% EX OINT: 1.0000 "application " | TOPICAL_OINTMENT | Freq: Two times a day (BID) | CUTANEOUS | 0 refills | Status: DC

## 2016-08-07 MED ORDER — SULFAMETHOXAZOLE-TRIMETHOPRIM 800-160 MG PO TABS: 1.0000 | ORAL_TABLET | Freq: Two times a day (BID) | ORAL | 0 refills | Status: DC

## 2016-08-07 NOTE — Telephone Encounter (Signed)
Prescriptions submitted and patient notified.

## 2016-08-07 NOTE — Telephone Encounter (Signed)
See message and please advise, Thanks!  

## 2016-08-07 NOTE — Telephone Encounter (Signed)
She can have Septra DS one tablet twice a day for 5 days for UTI She can also have nystatin cream to apply twice a day on the irritated skin

## 2016-08-07 NOTE — Telephone Encounter (Signed)
Pt is asking for medicine to help her with a possible UTI or bladder infection, also is there anything that can be sent to her pharmacy for her bottom being red and sore.

## 2016-08-22 ENCOUNTER — Other Ambulatory Visit: Payer: Self-pay | Admitting: Endocrinology

## 2016-09-01 ENCOUNTER — Other Ambulatory Visit: Payer: Self-pay | Admitting: Endocrinology

## 2016-09-08 ENCOUNTER — Encounter: Payer: Self-pay | Admitting: Endocrinology

## 2016-09-08 ENCOUNTER — Ambulatory Visit (INDEPENDENT_AMBULATORY_CARE_PROVIDER_SITE_OTHER): Payer: Medicare Other | Admitting: Endocrinology

## 2016-09-08 VITALS — BP 128/78 | HR 69 | Temp 98.1°F | Ht 60.0 in | Wt 133.0 lb

## 2016-09-08 DIAGNOSIS — E1165 Type 2 diabetes mellitus with hyperglycemia: Secondary | ICD-10-CM

## 2016-09-08 DIAGNOSIS — R110 Nausea: Secondary | ICD-10-CM | POA: Diagnosis not present

## 2016-09-08 DIAGNOSIS — D638 Anemia in other chronic diseases classified elsewhere: Secondary | ICD-10-CM

## 2016-09-08 DIAGNOSIS — Z794 Long term (current) use of insulin: Secondary | ICD-10-CM

## 2016-09-08 DIAGNOSIS — R300 Dysuria: Secondary | ICD-10-CM | POA: Diagnosis not present

## 2016-09-08 LAB — COMPREHENSIVE METABOLIC PANEL
ALT: 12 U/L (ref 0–35)
AST: 18 U/L (ref 0–37)
Albumin: 4.1 g/dL (ref 3.5–5.2)
Alkaline Phosphatase: 80 U/L (ref 39–117)
BUN: 13 mg/dL (ref 6–23)
CO2: 28 mEq/L (ref 19–32)
Calcium: 9.7 mg/dL (ref 8.4–10.5)
Chloride: 101 mEq/L (ref 96–112)
Creatinine, Ser: 0.84 mg/dL (ref 0.40–1.20)
GFR: 69.47 mL/min (ref >60.00)
Glucose, Bld: 220 mg/dL — ABNORMAL HIGH (ref 70–99)
Potassium: 4.2 mEq/L (ref 3.5–5.1)
Sodium: 138 mEq/L (ref 135–145)
Total Bilirubin: 0.5 mg/dL (ref 0.2–1.2)
Total Protein: 7.2 g/dL (ref 6.0–8.3)

## 2016-09-08 LAB — URINALYSIS, ROUTINE W REFLEX MICROSCOPIC
Bilirubin Urine: NEGATIVE
Hgb urine dipstick: NEGATIVE
Ketones, ur: NEGATIVE
Leukocytes, UA: NEGATIVE
Nitrite: NEGATIVE
RBC / HPF: NONE SEEN (ref 0–?)
Specific Gravity, Urine: <=1.005 — AB (ref 1.000–1.030)
Total Protein, Urine: NEGATIVE
Urine Glucose: NEGATIVE
Urobilinogen, UA: 0.2 (ref 0.0–1.0)
WBC, UA: NONE SEEN (ref 0–?)
pH: 6 (ref 5.0–8.0)

## 2016-09-08 LAB — CBC WITH DIFFERENTIAL/PLATELET
Basophils Absolute: 0 10*3/uL (ref 0.0–0.1)
Basophils Relative: 0.5 % (ref 0.0–3.0)
Eosinophils Absolute: 0.5 10*3/uL (ref 0.0–0.7)
Eosinophils Relative: 6.5 % — ABNORMAL HIGH (ref 0.0–5.0)
HCT: 36.5 % (ref 36.0–46.0)
Hemoglobin: 12.4 g/dL (ref 12.0–15.0)
Lymphocytes Relative: 23.5 % (ref 12.0–46.0)
Lymphs Abs: 1.9 10*3/uL (ref 0.7–4.0)
MCHC: 34 g/dL (ref 30.0–36.0)
MCV: 89.2 fl (ref 78.0–100.0)
Monocytes Absolute: 0.7 10*3/uL (ref 0.1–1.0)
Monocytes Relative: 8.4 % (ref 3.0–12.0)
Neutro Abs: 4.8 10*3/uL (ref 1.4–7.7)
Neutrophils Relative %: 61.1 % (ref 43.0–77.0)
Platelets: 283 10*3/uL (ref 150.0–400.0)
RBC: 4.09 Mil/uL (ref 3.87–5.11)
RDW: 14.1 % (ref 11.5–15.5)
WBC: 7.9 10*3/uL (ref 4.0–10.5)

## 2016-09-08 MED ORDER — PROMETHAZINE HCL 12.5 MG PO TABS: 12.5000 mg | ORAL_TABLET | Freq: Three times a day (TID) | ORAL | 0 refills | Status: DC | PRN

## 2016-09-08 NOTE — Progress Notes (Signed)
Patient ID: DMIA BIERL, female   DOB: [birth year], 81 y.o.   MRN: [id number]   Reason for Appointment:  Follow-up of multiple issues   History of Present Illness    Problem 1:  Nausea   HISTORY For the last 2-3 weeks she has had persistent nausea. She says she gets nauseated soon after she is eating but able to mostly eat her meals. No vomiting or reflux. She is trying to take her Prilosec everyday without any relief. No abdominal pain, diarrhea.  Mild constipation present  She is also having some burning with urination for the last 2-3 days without fever She was treated for a UTI about a month ago with Septra    Type 2 DIABETES MELITUS, date of diagnosis: 2003  She has been on insulin for several years but continues to have labile blood sugars despite using basal bolus regimen She is very compliant with her insulin at mealtimes and is consistent with the timing of her Levemir insulin twice a day Is taking small amounts of insulin and is not on any oral hypoglycemics, appears to be completely insulin-dependent Her diet is somewhat variable but she is usually having balanced meals and not eating out excessively      RECENT history:   Insulin regimen: Tresiba 12 units;   mealtime coverage breakfast: 3-4, 4  lunch and 3-4 at supper    She was changed from Levemir to [place] insulin in 1/17 because of relatively inconsistent control and [name] persistently over 8%.  Current blood sugar trends  Her blood sugars are fluctuating significantly at different times especially after supper Has not checked in a fasting readings but did have high readings on the weekend up to 371 in the morning and she does not know why Also has sporadic low sugars at different times Her overall median reading is 131 with average reading lowest right after lunch but highest before supper       Proper timing of medications in relation to meals: Yes.  Will take insulin with her when she is  eating out .          Monitors blood glucose:  3 times a day.    Glucometer: One Touch.          Blood Glucose readings from meter download as above:   Symptoms with hypoglycemia: feels weeak and shaky Low sugars are treated with Soft drink or glucose tablets      Meals: 3 meals per day.  Usually has small portions. Eggs or waffle with protein at 7 am; has mid morning snack. Occ hs snack   Evening meal usually balanced at 5 pm with 1-2 carbohydrates Physical activity: exercise: walking 2/7 and some gardening           Dietician visit: Most recent: 2004            Wt Readings from Last 3 Encounters:  09/08/16 133 lb (60.3 kg)  06/29/16 133 lb 12.8 oz (60.7 kg)  03/30/16 132 lb (123456 kg)   Complications: are: Mild neuropathy controlled symptomatically  Lab Results  Component Value Date   HGBA1C 6.9 (H) 06/25/2016   HGBA1C 7.6 (H) 03/23/2016   HGBA1C 6.9 12/26/2015   Lab Results  Component Value Date   MICROALBUR <0.7 03/23/2016   LDLCALC 57 03/23/2016   CREATININE 0.84 06/25/2016     HYPERTENSION:  this has been mild and treated with a half of 50 mg losartan  Vitamin D. Deficiency.  She  is on a maintenance dose with 50,000 units monthly with stable results:  Lab Results  Component Value Date   VD25OH 50.84 06/25/2016   VD25OH 58.87 07/08/2015      LABS:  No visits with results within 1 Week(s) from this visit.  Latest known visit with results is:  Office Visit on 06/29/2016  Component Date Value Ref Range Status  . Iron 06/29/2016 71  42 - 145 ug/dL Final  . Transferrin 06/29/2016 205.0* 212.0 - 360.0 mg/dL Final  . Saturation Ratios 06/29/2016 24.7  20.0 - 50.0 % Final  . Vitamin B-12 06/29/2016 315  211 - 911 pg/mL Final  . TSH 06/29/2016 1.39  0.35 - 4.50 uIU/mL Final  . Free T4 06/29/2016 0.99  0.60 - 1.60 ng/dL Final      Medication List       Accurate as of 09/08/16  8:34 AM. Always use your most recent med list.          Alcohol Pads 70 %  Pads USE 4 PER DAY   aspirin 81 MG tablet Take 81 mg by mouth daily.   fish oil-omega-3 fatty acids 1000 MG capsule Take 2 g by mouth daily. Two a day   FLUARIX QUADRIVALENT 0.5 ML injection Generic drug:  influenza vac split quadrivalent PF   gabapentin 100 MG capsule Commonly known as:  NEURONTIN TAKE 3 TO 4 CAPSULES BY MOUTH AT BEDTIME.   glucose blood test strip Commonly known as:  ONE TOUCH ULTRA TEST USE TO TEST BLOOD SUGAR 3 TIMES DAILY DX code E11.65   Insulin Pen Needle 32G X 4 MM Misc Commonly known as:  BD PEN NEEDLE NANO U/F USE AS DIRECTED FIVE TIMES A DAY.   BD PEN NEEDLE NANO U/F 32G X 4 MM Misc Generic drug:  Insulin Pen Needle USE AS DIRECTED FIVE TIMES A DAY.   levothyroxine 75 MCG tablet Commonly known as:  SYNTHROID, LEVOTHROID TAKE 1 TABLET BY MOUTH DAILY BEFORE BREAKFAST, EXCEPT TAKE 1 AND 1/2 TABLETS ON SUNDAY.   losartan 50 MG tablet Commonly known as:  COZAAR TAKE 1 TABLET BY MOUTH DAILY.   lovastatin 40 MG tablet Commonly known as:  MEVACOR TAKE 1 TABLET BY MOUTH ONCE DAILY WITH A MEAL.   mirtazapine 15 MG tablet Commonly known as:  REMERON TAKE 1/2 TABLET (7.5 MG TOTAL) BY MOUTH AT BEDTIME.   NOVOLOG FLEXPEN 100 UNIT/ML FlexPen Generic drug:  insulin aspart INJECT 3 TO 5 UNITS SUBCUTANEOUSLY BEFORE MEALS AS DIRECTED.   nystatin-triamcinolone ointment Commonly known as:  MYCOLOG Apply 1 application topically 2 (two) times daily.   omeprazole 20 MG capsule Commonly known as:  PRILOSEC TAKE 1 CAPSULE BY MOUTH ONCE DAILY.   PRESERVISION AREDS 2 Caps Take by mouth.   promethazine 12.5 MG tablet Commonly known as:  PHENERGAN Take 1 tablet (12.5 mg total) by mouth every 8 (eight) hours as needed for nausea or vomiting.   sulfamethoxazole-trimethoprim 800-160 MG tablet Commonly known as:  BACTRIM DS,SEPTRA DS Take 1 tablet by mouth 2 (two) times daily.   TRESIBA FLEXTOUCH 100 UNIT/ML Sopn FlexTouch Pen Generic drug:  insulin  degludec INJECT 15 UNITS INTO THE SKIN DAILY.   vitamin C 100 MG tablet Take 100 mg by mouth daily.   Vitamin D (Ergocalciferol) 50000 units Caps capsule Commonly known as:  DRISDOL TAKE 1 CAPSULE BY MOUTH ONCE A MONTH.       Allergies:  Allergies  Allergen Reactions  . Macrodantin [Nitrofurantoin Macrocrystal]   .  Penicillins     No past medical history on file.  Past Surgical History:  Procedure Laterality Date  . ABDOMINAL HYSTERECTOMY      Family History  Problem Relation Age of Onset  . Cancer Mother   . Diabetes Brother     Social History:  reports that she has never smoked. She has never used smokeless tobacco. Her alcohol and drug histories are not on file.  Review of Systems:    Anemia: This is relatively new and has normal iron and B12 levels Hemoccult negative   Lab Results  Component Value Date   WBC 8.2 06/25/2016   HGB 11.7 (L) 06/25/2016   HCT 34.8 (L) 06/25/2016   MCV 89.0 06/25/2016   PLT 249.0 06/25/2016   Lab Results  Component Value Date   OCCULTBLD Negative 04/22/2016   OCCULTBLD Positive (A) 02/12/2014    HYPOTHYROIDISM: She has had primary hypothyroidism for over 20 years. Her levels have been stable with 75 mcg levothyroxine 7.5 tablets per week, has been on this dose since 11/15   Lab Results  Component Value Date   FREET4 0.99 06/29/2016   FREET4 1.07 03/23/2016   FREET4 0.94 03/05/2015   TSH 1.39 06/29/2016   TSH 1.01 03/23/2016   TSH 2.62 10/03/2015     HYPERLIPIDEMIA: The lipid abnormality consists of elevated LDL,  taking lovastatin with good control.  Lab Results  Component Value Date   CHOL 124 03/23/2016   HDL 50.50 03/23/2016   LDLCALC 57 03/23/2016   TRIG 82.0 03/23/2016   CHOLHDL 2 03/23/2016   NEUROPATHY: She is still taking the same dose of 200 mg of gabapentin at bedtime with relief      Examination:   BP 128/78   Pulse 69   Temp 98.1 F (36.7 C)   Ht 5' (1.524 m)   Wt 133 lb (60.3 kg)    SpO2 96%   BMI 25.97 kg/m   Body mass index is 25.97 kg/m.   She has a head tremor present. No lymphadenopathy in the neck  Abdominal exam shows no distention, tenderness.  No hepatosplenomegaly or other mass. Heart sounds normal and regular No pedal edema  ASSESSMENT/ PLAN:    NAUSEA: Etiology unclear, has mostly postprandial nausea, not typical of gastroparesis Will check CBC, chemistry and urinalysis. Empirically try Phenergan for nausea  ?  UTI: We will check urinalysis today  DIABETES: Her blood sugars are fluctuating and may be related to her nausea and inconsistent diet recently Fasting readings need to be checked more consistently but since she doesn't have a consistent pattern will not change her Tresiba [name] in 1 month on her follow-up  Hyperlipidemia: Well controlled  ANEMIA: This is mild but a new problem, will recheck now  Hypertension: Her blood pressure is well-controlled with only 25 mg losartan   Total visit time for evaluation and management of multiple problems = 25 minutes    There are no Patient Instructions on file for this visit.      Keilyn Nadal 09/08/2016, 8:34 AM

## 2016-09-08 NOTE — Progress Notes (Signed)
Please let patient know that she has no infection in the urine.  If her nausea is not improving with promethazine will need to get GI consultation and other testing

## 2016-09-22 ENCOUNTER — Other Ambulatory Visit: Payer: Self-pay | Admitting: Endocrinology

## 2016-10-02 ENCOUNTER — Other Ambulatory Visit: Payer: Self-pay | Admitting: Endocrinology

## 2016-10-08 ENCOUNTER — Other Ambulatory Visit: Payer: Medicare Other

## 2016-10-12 ENCOUNTER — Ambulatory Visit (INDEPENDENT_AMBULATORY_CARE_PROVIDER_SITE_OTHER): Payer: PPO | Admitting: Endocrinology

## 2016-10-12 ENCOUNTER — Other Ambulatory Visit: Payer: Medicare Other

## 2016-10-12 ENCOUNTER — Encounter: Payer: Self-pay | Admitting: Endocrinology

## 2016-10-12 VITALS — BP 102/62 | HR 70 | Ht 60.0 in | Wt 133.0 lb

## 2016-10-12 DIAGNOSIS — E1165 Type 2 diabetes mellitus with hyperglycemia: Secondary | ICD-10-CM | POA: Diagnosis not present

## 2016-10-12 DIAGNOSIS — J069 Acute upper respiratory infection, unspecified: Secondary | ICD-10-CM

## 2016-10-12 DIAGNOSIS — R110 Nausea: Secondary | ICD-10-CM | POA: Diagnosis not present

## 2016-10-12 DIAGNOSIS — Z794 Long term (current) use of insulin: Secondary | ICD-10-CM

## 2016-10-12 DIAGNOSIS — I10 Essential (primary) hypertension: Secondary | ICD-10-CM | POA: Diagnosis not present

## 2016-10-12 LAB — BASIC METABOLIC PANEL
BUN: 12 mg/dL (ref 6–23)
CO2: 28 mEq/L (ref 19–32)
Calcium: 9.6 mg/dL (ref 8.4–10.5)
Chloride: 104 mEq/L (ref 96–112)
Creatinine, Ser: 0.84 mg/dL (ref 0.40–1.20)
GFR: 69.46 mL/min (ref >60.00)
Glucose, Bld: 69 mg/dL — ABNORMAL LOW (ref 70–99)
Potassium: 3.8 mEq/L (ref 3.5–5.1)
Sodium: 140 mEq/L (ref 135–145)

## 2016-10-12 LAB — HEMOGLOBIN A1C: Hgb A1c MFr Bld: 7.5 % — ABNORMAL HIGH (ref 4.6–6.5)

## 2016-10-12 NOTE — Progress Notes (Signed)
Patient ID: Jennifer Bailey, female   DOB: 22-Oct-1934, 82 y.o.   MRN: [id number]   Reason for Appointment:  Follow-up of various problems   History of Present Illness    Type 2 DIABETES MELITUS, date of diagnosis: 2003  She has been on basal bolus insulin for several years but continues to have labile blood sugars She is very compliant with her insulin at mealtimes and is consistent with his insulin doses Is taking small amounts of insulin and is not on any oral hypoglycemics, appears to be completely insulin-dependent Her diet is somewhat variable but she is usually having balanced meals and not eating out excessively      RECENT history:   Insulin regimen: Tresiba 13 units;   mealtime coverage breakfast: 3-4, 4  lunch and 3-4 at supper    She was changed from Levemir to [place] insulin in 1/17 because of relatively inconsistent control and [name] persistently over 8%.  [name] is somewhat higher at 7.5 compared to 6.9  Current blood sugar trends, problems and management:  She has increased her Tresiba by 1 unit about a month ago since readings were higher in the morning  However appears that with her recent respiratory infection her blood sugars are significantly higher throughout the day in the last few days  Otherwise has been showing usual variability in her blood sugars especially at bedtime  Fasting blood sugars are more consistently high the last 4 days, frequently over 200 but have been as low as 97.  She may tend to have lower readings after breakfast at times  Also may occasionally have low blood sugars after lunch/before supper  Has readings as high as 358 after supper/at bedtime and she does not know why.  She is trying to take her mealtime insulin consistently before eating  She will increase her insulin 1-2 units for higher readings but recently this is not improving her control    Monitors blood glucose:  3 times a day.    Glucometer: One Touch.           Blood Glucose readings from meter download as   Mean values apply above for all meters except median for One Touch  PRE-MEAL Fasting Lunch Dinner Bedtime Overall  Glucose range: 97-254  76-422   77-358    Mean/median:  158  221  239  155 +/83    Symptoms with hypoglycemia: feels weeak and shaky Low sugars are treated with Soft drink or glucose tablets      Meals: 3 meals per day.  Usually has small portions. Eggs/toast at 7 am; has mid morning snack.  Based on blood sugar at bedtime she may have a hs snack   Evening meal usually balanced at 5 pm with 1-2 carbohydrates  Physical activity: exercise: at home with the video program, less since she had acute illness          Dietician visit: Most recent: 2004            Wt Readings from Last 3 Encounters:  10/12/16 133 lb (60.3 kg)  09/08/16 133 lb (60.3 kg)  06/29/16 133 lb 12.8 oz ([id number] kg)    Complications: are: Mild neuropathy controlled symptomatically  Lab Results  Component Value Date   HGBA1C 7.5 (H) 10/12/2016   HGBA1C 6.9 (H) 06/25/2016   HGBA1C 7.6 (H) 03/23/2016   Lab Results  Component Value Date   MICROALBUR <0.7 03/23/2016   LDLCALC 57 03/23/2016   CREATININE 0.84 10/12/2016  Vitamin D. Deficiency.  She is on a maintenance dose with 50,000 units monthly with stable results:  Lab Results  Component Value Date   VD25OH 50.84 06/25/2016   VD25OH 58.87 07/08/2015   Other active problems: See review of systems    LABS:  Office Visit on 10/12/2016  Component Date Value Ref Range Status  . Hgb [name] MFr Bld 10/12/2016 7.5* 4.6 - 6.5 % Final  . Sodium 10/12/2016 140  135 - 145 mEq/L Final  . Potassium 10/12/2016 3.8  3.5 - 5.1 mEq/L Final  . Chloride 10/12/2016 104  96 - 112 mEq/L Final  . CO2 10/12/2016 28  19 - 32 mEq/L Final  . Glucose, Bld 10/12/2016 69* 70 - 99 mg/dL Final  . BUN 10/12/2016 12  6 - 23 mg/dL Final  . Creatinine, Ser 10/12/2016 0.84  0.40 - 1.20 mg/dL Final  . Calcium  10/12/2016 9.6  8.4 - 10.5 mg/dL Final  . GFR 10/12/2016 69.46  >60.00 mL/min Final    Allergies as of 10/12/2016      Reactions   Macrodantin [nitrofurantoin Macrocrystal]    Penicillins       Medication List       Accurate as of 10/12/16 11:59 PM. Always use your most recent med list.          Alcohol Pads 70 % Pads USE 4 PER DAY   aspirin 81 MG tablet Take 81 mg by mouth daily.   fish oil-omega-3 fatty acids 1000 MG capsule Take 2 g by mouth daily. Two a day   FLUARIX QUADRIVALENT 0.5 ML injection Generic drug:  influenza vac split quadrivalent PF   gabapentin 100 MG capsule Commonly known as:  NEURONTIN TAKE 3 TO 4 CAPSULES BY MOUTH AT BEDTIME.   glucose blood test strip Commonly known as:  ONE TOUCH ULTRA TEST USE TO TEST BLOOD SUGAR 3 TIMES DAILY DX code E11.65   Insulin Pen Needle 32G X 4 MM Misc Commonly known as:  BD PEN NEEDLE NANO U/F USE AS DIRECTED FIVE TIMES A DAY.   BD PEN NEEDLE NANO U/F 32G X 4 MM Misc Generic drug:  Insulin Pen Needle USE AS DIRECTED FIVE TIMES A DAY.   levothyroxine 75 MCG tablet Commonly known as:  SYNTHROID, LEVOTHROID TAKE 1 TABLET BY MOUTH DAILY BEFORE BREAKFAST, EXCEPT TAKE 1 AND 1/2 TABLETS ON SUNDAY.   losartan 50 MG tablet Commonly known as:  COZAAR TAKE 1 TABLET BY MOUTH DAILY.   lovastatin 40 MG tablet Commonly known as:  MEVACOR TAKE 1 TABLET BY MOUTH ONCE DAILY WITH A MEAL.   mirtazapine 15 MG tablet Commonly known as:  REMERON TAKE 1/2 TABLET (7.5 MG TOTAL) BY MOUTH AT BEDTIME.   NOVOLOG FLEXPEN 100 UNIT/ML FlexPen Generic drug:  insulin aspart INJECT 3 TO 5 UNITS SUBCUTANEOUSLY BEFORE MEALS AS DIRECTED.   nystatin-triamcinolone ointment Commonly known as:  MYCOLOG Apply 1 application topically 2 (two) times daily.   omeprazole 20 MG capsule Commonly known as:  PRILOSEC TAKE 1 CAPSULE BY MOUTH ONCE DAILY.   PRESERVISION AREDS 2 Caps Take by mouth.   promethazine 12.5 MG tablet Commonly known as:   PHENERGAN Take 1 tablet (12.5 mg total) by mouth every 8 (eight) hours as needed for nausea or vomiting.   sulfamethoxazole-trimethoprim 800-160 MG tablet Commonly known as:  BACTRIM DS,SEPTRA DS Take 1 tablet by mouth 2 (two) times daily.   TRESIBA FLEXTOUCH 100 UNIT/ML Sopn FlexTouch Pen Generic drug:  insulin degludec INJECT 15  UNITS INTO THE SKIN DAILY.   vitamin C 100 MG tablet Take 100 mg by mouth daily.   Vitamin D (Ergocalciferol) 50000 units Caps capsule Commonly known as:  DRISDOL TAKE 1 CAPSULE BY MOUTH ONCE A MONTH.       Allergies:  Allergies  Allergen Reactions  . Macrodantin [Nitrofurantoin Macrocrystal]   . Penicillins     No past medical history on file.  Past Surgical History:  Procedure Laterality Date  . ABDOMINAL HYSTERECTOMY      Family History  Problem Relation Age of Onset  . Cancer Mother   . Diabetes Brother     Social History:  reports that she has never smoked. She has never used smokeless tobacco. Her alcohol and drug histories are not on file.  Review of Systems:   NAUSEA: She was having nausea in December and she thinks this is better in the last few days, did not seem to benefit from promethazine   HYPERTENSION:  this has been mild and treated with a half of 50 mg losartan  Anemia: This is resolved   Lab Results  Component Value Date   WBC 7.9 09/08/2016   HGB 12.4 09/08/2016   HCT 36.5 09/08/2016   MCV 89.2 09/08/2016   PLT 283.0 09/08/2016   Lab Results  Component Value Date   OCCULTBLD Negative 04/22/2016   OCCULTBLD Positive (A) 02/12/2014    HYPOTHYROIDISM: She has had primary hypothyroidism for over 20 years. Her levels have been stable with 75 mcg levothyroxine 7.5 tablets per week, has been on this dose since 11/15   Lab Results  Component Value Date   FREET4 0.99 06/29/2016   FREET4 1.07 03/23/2016   FREET4 0.94 03/05/2015   TSH 1.39 06/29/2016   TSH 1.01 03/23/2016   TSH 2.62 10/03/2015      HYPERLIPIDEMIA: The lipid abnormality consists of elevated LDL,  taking lovastatin with good control.  Lab Results  Component Value Date   CHOL 124 03/23/2016   HDL 50.50 03/23/2016   LDLCALC 57 03/23/2016   TRIG 82.0 03/23/2016   CHOLHDL 2 03/23/2016   NEUROPATHY: She is taking the same dose of 200 mg of gabapentin at bedtime with relief  She is asking about having a cough for the last few days without fever, chills and recently more of a dry cough.  No recent nasal congestion or sinus headache      Examination:   BP 102/62   Pulse 70   Ht 5' (1.524 m)   Wt 133 lb (60.3 kg)   SpO2 96%   BMI 25.97 kg/m   Body mass index is 25.97 kg/m.   Exam shows lungs clear, no added to No lymphadenopathy in the neck No edema  ASSESSMENT/ PLAN:    DIABETES:  See history of present illness for detailed discussion of current diabetes management, blood sugar patterns and problems identified Blood sugars are fairly inconsistent now Most of her recent high blood sugars may be related to acute viral infection Since blood sugars are high throughout the day including fasting she can go up at least 2 units on her [place] Aas for now She needs to take at least 2-3 units more extra when blood sugars are high at mealtimes as long as she is having high sugars with illness  Probably need to reduce his breakfast dose by 1 unit as blood sugars are otherwise tending to get low normal   Nausea: Improving but she can follow up with gastroenterologist if needed  Hyperlipidemia: Well controlled  Hypothyroidism: Will need TSH on the next visit   Upper respiratory infection: Appears viral and mild, she can take sugar-free OTC cough syrup    Total visit time for evaluation and management of multiple problems = 25 minutes    Patient Instructions  Take 15 Tresiba until am sugar gets < 120       Loraine Freid 10/13/2016, 12:25 PM

## 2016-10-12 NOTE — Patient Instructions (Signed)
Take 15 Tresiba until am sugar gets < 120

## 2016-10-19 ENCOUNTER — Telehealth: Payer: Self-pay | Admitting: Endocrinology

## 2016-10-19 MED ORDER — GLUCOSE BLOOD VI STRP: ORAL_STRIP | 2 refills | Status: DC

## 2016-10-19 NOTE — Telephone Encounter (Signed)
Refill submitted. 

## 2016-10-19 NOTE — Telephone Encounter (Signed)
Need refill of One touch verio flex  [facility] - [place], [place] [phone] (Phone) [phone] (Fax)

## 2016-11-09 DIAGNOSIS — R112 Nausea with vomiting, unspecified: Secondary | ICD-10-CM | POA: Diagnosis not present

## 2016-11-17 ENCOUNTER — Telehealth: Payer: Self-pay | Admitting: Endocrinology

## 2016-11-17 NOTE — Telephone Encounter (Signed)
FYI: Insurance has approved her to take the promethazine tab 25 mg

## 2016-11-19 NOTE — Telephone Encounter (Signed)
This is an old prescription.  Please ask her if she needs it, currently seeing gastroenterologist

## 2016-11-30 DIAGNOSIS — R112 Nausea with vomiting, unspecified: Secondary | ICD-10-CM | POA: Diagnosis not present

## 2016-12-09 DIAGNOSIS — R112 Nausea with vomiting, unspecified: Secondary | ICD-10-CM | POA: Diagnosis not present

## 2016-12-10 ENCOUNTER — Ambulatory Visit: Payer: Medicare Other | Admitting: Endocrinology

## 2016-12-22 ENCOUNTER — Other Ambulatory Visit: Payer: Self-pay | Admitting: Endocrinology

## 2016-12-25 ENCOUNTER — Other Ambulatory Visit: Payer: Self-pay | Admitting: Endocrinology

## 2016-12-25 DIAGNOSIS — Z1231 Encounter for screening mammogram for malignant neoplasm of breast: Secondary | ICD-10-CM

## 2016-12-31 NOTE — Telephone Encounter (Signed)
error 

## 2017-01-07 ENCOUNTER — Other Ambulatory Visit: Payer: Medicare Other

## 2017-01-11 ENCOUNTER — Ambulatory Visit: Payer: Medicare Other | Admitting: Endocrinology

## 2017-01-11 ENCOUNTER — Other Ambulatory Visit (INDEPENDENT_AMBULATORY_CARE_PROVIDER_SITE_OTHER): Payer: Self-pay

## 2017-01-11 DIAGNOSIS — E1165 Type 2 diabetes mellitus with hyperglycemia: Secondary | ICD-10-CM

## 2017-01-11 DIAGNOSIS — Z794 Long term (current) use of insulin: Secondary | ICD-10-CM

## 2017-01-11 LAB — BASIC METABOLIC PANEL
BUN: 15 mg/dL (ref 6–23)
CO2: 28 mEq/L (ref 19–32)
Calcium: 9.3 mg/dL (ref 8.4–10.5)
Chloride: 104 mEq/L (ref 96–112)
Creatinine, Ser: 0.84 mg/dL (ref 0.40–1.20)
GFR: 69.41 mL/min (ref >60.00)
Glucose, Bld: 162 mg/dL — ABNORMAL HIGH (ref 70–99)
Potassium: 4.5 mEq/L (ref 3.5–5.1)
Sodium: 139 mEq/L (ref 135–145)

## 2017-01-11 LAB — HEMOGLOBIN A1C: Hgb A1c MFr Bld: 7.7 % — ABNORMAL HIGH (ref 4.6–6.5)

## 2017-01-11 LAB — TSH: TSH: 2.21 u[IU]/mL (ref 0.35–4.50)

## 2017-01-14 ENCOUNTER — Encounter: Payer: Self-pay | Admitting: Endocrinology

## 2017-01-14 ENCOUNTER — Ambulatory Visit
Admission: RE | Admit: 2017-01-14 | Discharge: 2017-01-14 | Disposition: A | Discharge status: Home / Self Care | Payer: PPO | Source: Ambulatory Visit | Attending: Endocrinology | Admitting: Endocrinology

## 2017-01-14 ENCOUNTER — Ambulatory Visit (INDEPENDENT_AMBULATORY_CARE_PROVIDER_SITE_OTHER): Payer: PPO | Admitting: Endocrinology

## 2017-01-14 VITALS — BP 126/60 | HR 74 | Ht 59.0 in | Wt 135.0 lb

## 2017-01-14 DIAGNOSIS — I10 Essential (primary) hypertension: Secondary | ICD-10-CM

## 2017-01-14 DIAGNOSIS — Z1231 Encounter for screening mammogram for malignant neoplasm of breast: Secondary | ICD-10-CM | POA: Diagnosis not present

## 2017-01-14 DIAGNOSIS — E1165 Type 2 diabetes mellitus with hyperglycemia: Secondary | ICD-10-CM

## 2017-01-14 DIAGNOSIS — Z794 Long term (current) use of insulin: Secondary | ICD-10-CM

## 2017-01-14 DIAGNOSIS — E038 Other specified hypothyroidism: Secondary | ICD-10-CM | POA: Diagnosis not present

## 2017-01-14 DIAGNOSIS — E063 Autoimmune thyroiditis: Secondary | ICD-10-CM

## 2017-01-14 MED ORDER — GLUCOSE BLOOD VI STRP: ORAL_STRIP | 2 refills | Status: DC

## 2017-01-14 NOTE — Progress Notes (Signed)
Patient ID: Jennifer Bailey, female   DOB: [birth year], 82 y.o.   MRN: [id number]   Reason for Appointment:  Follow-up of various problems   History of Present Illness    Type 2 DIABETES MELITUS, date of diagnosis: 2003  She has been on basal bolus insulin for several years but continues to have labile blood sugars She is very compliant with her insulin at mealtimes and is consistent with his insulin doses Is taking small amounts of insulin and is not on any oral hypoglycemics, appears to be completely insulin-dependent Her diet is somewhat variable but she is usually having balanced meals and not eating out excessively      RECENT history:   Insulin regimen: Tresiba 13 units;   mealtime coverage breakfast: 3-4, 4  lunch and 2-3 at supper    She was changed from Levemir to [place] insulin in 1/17 because of relatively inconsistent control and [name] persistently over 8%.  [name] is somewhat higher at 7.7, previously 7.5  Current blood sugar trends, problems and management:  She has been very inconsistent blood sugar patterns at all times  Because of her insurance limitations she is checking her blood sugars mostly at breakfast and bedtime and sporadically in between  Again she cannot explain a marked variability in her blood sugars both morning and bedtime  However she thinks that she has a tendency to overeat when she has a relatively low blood sugar at night and the next morning her sugar will be over 300  Also she cannot explain the reading of 402 before lunch  She cannot remember what she has had for her meals the last couple of days with bedtime readings of 41 and 177; she is mostly taking 2 units of insulin at suppertime  LOWEST blood sugars on an average or probably before supper but inconsistent  Also readings after lunch do not have any pattern  POSTPRANDIAL readings after breakfast tend to be generally fairly good, lowest reading 46  She has some kind of  exercise with the videotape or lawnmowing about every day either morning or afternoon  Monitors blood glucose:  3 times a day.    Glucometer: One Touch.          Blood Glucose readings from meter download as follows  Mean values apply above for all meters except median for One Touch  PRE-MEAL Fasting Lunch Dinner Bedtime Overall  Glucose range: 70-3 67    41-3 89    Mean/median: 180  133   137  146   POST-MEAL PC Breakfast PC Lunch PC Dinner  Glucose range:     Mean/median: 121  286     Symptoms with hypoglycemia: feels weeak and shaky Low sugars are treated with Soft drink or glucose tablets      Meals: 3 meals per day.  Usually has small portions. Eggs/toast at 7 am; has mid morning snack.  Based on blood sugar at bedtime she may have a hs snack   Evening meal usually balanced at 5 pm with 1-2 carbohydrates  Physical activity: exercise: at home with the video program,          Dietician visit: Most recent: 2004            Wt Readings from Last 3 Encounters:  01/14/17 135 lb (61.2 kg)  10/12/16 133 lb (60.3 kg)  09/08/16 133 lb (48.1 kg)    Complications: are: Mild neuropathy controlled symptomatically  Lab Results  Component Value Date  HGBA1C 7.7 (H) 01/11/2017   HGBA1C 7.5 (H) 10/12/2016   HGBA1C 6.9 (H) 06/25/2016   Lab Results  Component Value Date   MICROALBUR <0.7 03/23/2016   LDLCALC 57 03/23/2016   CREATININE 0.84 01/11/2017      Vitamin D. Deficiency.  She is on a maintenance dose with 50,000 units monthly with stable results:  Lab Results  Component Value Date   VD25OH 50.84 06/25/2016   VD25OH 58.87 07/08/2015   Other active problems: See review of systems    LABS:  Lab on 01/11/2017  Component Date Value Ref Range Status  . Hgb [name] MFr Bld 01/11/2017 7.7* 4.6 - 6.5 % Final  . Sodium 01/11/2017 139  135 - 145 mEq/L Final  . Potassium 01/11/2017 4.5  3.5 - 5.1 mEq/L Final  . Chloride 01/11/2017 104  96 - 112 mEq/L Final  . CO2 01/11/2017  28  19 - 32 mEq/L Final  . Glucose, Bld 01/11/2017 162* 70 - 99 mg/dL Final  . BUN 01/11/2017 15  6 - 23 mg/dL Final  . Creatinine, Ser 01/11/2017 0.84  0.40 - 1.20 mg/dL Final  . Calcium 01/11/2017 9.3  8.4 - 10.5 mg/dL Final  . GFR 01/11/2017 69.41  >60.00 mL/min Final  . TSH 01/11/2017 2.21  0.35 - 4.50 uIU/mL Final    Allergies as of 01/14/2017      Reactions   Macrodantin [nitrofurantoin Macrocrystal]    Penicillins       Medication List       Accurate as of 01/14/17  1:00 PM. Always use your most recent med list.          Alcohol Pads 70 % Pads USE 4 PER DAY   aspirin 81 MG tablet Take 81 mg by mouth daily.   fish oil-omega-3 fatty acids 1000 MG capsule Take 2 g by mouth daily. Two a day   FLUARIX QUADRIVALENT 0.5 ML injection Generic drug:  influenza vac split quadrivalent PF   gabapentin 100 MG capsule Commonly known as:  NEURONTIN TAKE 3 TO 4 CAPSULES BY MOUTH AT BEDTIME.   glucose blood test strip Commonly known as:  ONETOUCH VERIO USE TO TEST BLOOD SUGAR 4 TIMES DAILY DX code E11.65   Insulin Pen Needle 32G X 4 MM Misc Commonly known as:  BD PEN NEEDLE NANO U/F USE AS DIRECTED FIVE TIMES A DAY.   BD PEN NEEDLE NANO U/F 32G X 4 MM Misc Generic drug:  Insulin Pen Needle USE AS DIRECTED FIVE TIMES A DAY.   levothyroxine 75 MCG tablet Commonly known as:  SYNTHROID, LEVOTHROID TAKE 1 TABLET BY MOUTH DAILY BEFORE BREAKFAST, EXCEPT TAKE 1 AND 1/2 TABLETS ON SUNDAY.   losartan 50 MG tablet Commonly known as:  COZAAR TAKE 1 TABLET BY MOUTH DAILY.   lovastatin 40 MG tablet Commonly known as:  MEVACOR TAKE 1 TABLET BY MOUTH ONCE DAILY WITH A MEAL.   mirtazapine 15 MG tablet Commonly known as:  REMERON TAKE 1/2 TABLET (7.5 MG TOTAL) BY MOUTH AT BEDTIME.   NOVOLOG FLEXPEN 100 UNIT/ML FlexPen Generic drug:  insulin aspart INJECT 3 TO 5 UNITS SUBCUTANEOUSLY BEFORE MEALS AS DIRECTED.   nystatin-triamcinolone ointment Commonly known as:  MYCOLOG Apply  1 application topically 2 (two) times daily.   omeprazole 20 MG capsule Commonly known as:  PRILOSEC TAKE 1 CAPSULE BY MOUTH ONCE DAILY.   PRESERVISION AREDS 2 Caps Take by mouth.   promethazine 12.5 MG tablet Commonly known as:  PHENERGAN Take 1 tablet (  12.5 mg total) by mouth every 8 (eight) hours as needed for nausea or vomiting.   sulfamethoxazole-trimethoprim 800-160 MG tablet Commonly known as:  BACTRIM DS,SEPTRA DS Take 1 tablet by mouth 2 (two) times daily.   TRESIBA FLEXTOUCH 100 UNIT/ML Sopn FlexTouch Pen Generic drug:  insulin degludec INJECT 15 UNITS INTO THE SKIN DAILY.   vitamin C 100 MG tablet Take 100 mg by mouth daily.   Vitamin D (Ergocalciferol) 50000 units Caps capsule Commonly known as:  DRISDOL TAKE 1 CAPSULE BY MOUTH ONCE A MONTH.       Allergies:  Allergies  Allergen Reactions  . Macrodantin [Nitrofurantoin Macrocrystal]   . Penicillins     No past medical history on file.  Past Surgical History:  Procedure Laterality Date  . ABDOMINAL HYSTERECTOMY      Family History  Problem Relation Age of Onset  . Cancer Mother   . Diabetes Brother     Social History:  reports that she has never smoked. She has never used smokeless tobacco. Her alcohol and drug histories are not on file.  Review of Systems:     HYPERTENSION:  this has been mild and treated with a half of 50 mg losartan Checks blood pressure  periodically at home  Nausea: This is resolved  He has had a gastroenterologist visit    HYPOTHYROIDISM: She has had primary hypothyroidism for over 20 years. Her levels have been stable with 75 mcg levothyroxine 7.5 tablets per week, has been on this dose since 11/15 No unusual fatigue  Lab Results  Component Value Date   FREET4 0.99 06/29/2016   FREET4 1.07 03/23/2016   FREET4 0.94 03/05/2015   TSH 2.21 01/11/2017   TSH 1.39 06/29/2016   TSH 1.01 03/23/2016     HYPERLIPIDEMIA: The lipid abnormality consists of elevated  LDL,  taking lovastatin with good control.  Lab Results  Component Value Date   CHOL 124 03/23/2016   HDL 50.50 03/23/2016   LDLCALC 57 03/23/2016   TRIG 82.0 03/23/2016   CHOLHDL 2 03/23/2016   NEUROPATHY: She is taking 200 mg of gabapentin at bedtime with relief of paresthesia       Examination:   BP 126/60   Pulse 74   Ht 4\' 11"  (1.499 m)   Wt 135 lb (61.2 kg)   BMI 27.27 kg/m   Body mass index is 27.27 kg/m.     ASSESSMENT/ PLAN:    DIABETES on insulin:  See history of present illness for detailed discussion of current diabetes management, blood sugar patterns and problems identified  Her [name] is reasonably good for her at 7.7 although has been better before Difficult to know what is causing her variable blood sugars at all times especially fasting This is despite using a stable dose of [place]  She is usually very consistent with following her diet and exercising and taking her insulin on time She has unexpected low blood sugars but not significant, does have about 8% of her blood sugars below 70  Since she may have variability of her blood sugar patterns and based on her diet composition and carbohydrate intake will need to get her an appointment with the dietitian.  Given her a record keeping sheet to enter her meals and pre-and post meal blood sugars for helping with analysis Meanwhile she will continue the same regimen of insulin Discussed avoiding overtreating hypoglycemia She may need to reduce or skip her mealtime dose if planning to be very active after a meal  Prescription has been sent for testing 4 times a day and she is able to do this will consider freestyle Libre sensor  Hyperlipidemia: Well controlled  Hypothyroidism: Well controlled  Counseling time on subjects discussed above is over 50% of today's 25 minute visit      There are no Patient Instructions on file for this visit.      Symphoni Helbling 01/14/2017, 1:00 PM

## 2017-01-18 DIAGNOSIS — H353131 Nonexudative age-related macular degeneration, bilateral, early dry stage: Secondary | ICD-10-CM | POA: Diagnosis not present

## 2017-01-18 DIAGNOSIS — H11153 Pinguecula, bilateral: Secondary | ICD-10-CM | POA: Diagnosis not present

## 2017-01-18 DIAGNOSIS — H18413 Arcus senilis, bilateral: Secondary | ICD-10-CM | POA: Diagnosis not present

## 2017-01-18 DIAGNOSIS — H11423 Conjunctival edema, bilateral: Secondary | ICD-10-CM | POA: Diagnosis not present

## 2017-01-18 DIAGNOSIS — Z961 Presence of intraocular lens: Secondary | ICD-10-CM | POA: Diagnosis not present

## 2017-01-18 DIAGNOSIS — H04123 Dry eye syndrome of bilateral lacrimal glands: Secondary | ICD-10-CM | POA: Diagnosis not present

## 2017-01-18 DIAGNOSIS — H40023 Open angle with borderline findings, high risk, bilateral: Secondary | ICD-10-CM | POA: Diagnosis not present

## 2017-01-18 DIAGNOSIS — Z9849 Cataract extraction status, unspecified eye: Secondary | ICD-10-CM | POA: Diagnosis not present

## 2017-01-20 ENCOUNTER — Other Ambulatory Visit: Payer: Self-pay | Admitting: Endocrinology

## 2017-01-22 ENCOUNTER — Telehealth: Payer: Self-pay | Admitting: Endocrinology

## 2017-01-22 ENCOUNTER — Other Ambulatory Visit: Payer: Self-pay

## 2017-01-22 MED ORDER — GLUCOSE BLOOD VI STRP: ORAL_STRIP | 2 refills | Status: DC

## 2017-01-22 NOTE — Telephone Encounter (Signed)
Ordered

## 2017-01-22 NOTE — Telephone Encounter (Signed)
One touch test strips 50 of the test strips are needed she was increased to testing 4 times daily  Please call into [facility]

## 2017-02-11 ENCOUNTER — Encounter: Payer: PPO | Attending: Endocrinology | Admitting: Dietician

## 2017-02-11 ENCOUNTER — Encounter: Payer: Self-pay | Admitting: Dietician

## 2017-02-11 DIAGNOSIS — Z713 Dietary counseling and surveillance: Secondary | ICD-10-CM | POA: Insufficient documentation

## 2017-02-11 DIAGNOSIS — Z6826 Body mass index (BMI) 26.0-26.9, adult: Secondary | ICD-10-CM | POA: Insufficient documentation

## 2017-02-11 DIAGNOSIS — Z794 Long term (current) use of insulin: Secondary | ICD-10-CM | POA: Insufficient documentation

## 2017-02-11 DIAGNOSIS — E1165 Type 2 diabetes mellitus with hyperglycemia: Secondary | ICD-10-CM | POA: Insufficient documentation

## 2017-02-11 NOTE — Patient Instructions (Addendum)
Stay active. Aim for 30 minutes most days. Consider reducing the amount of sweets that you eat. (cake, pie, cookies). Continue to eat regularly spaced meals.  Do not skip meals. Bake, broil, boil, or grill most days of the week Sauces on the side. See Dining out with Diabetes If your blood sugar is low, follow the "Rule of 15".  Plan:  Aim for 3 Carb Choices per meal (45 grams) +/- 1 either way  Aim for 0-15 Carbs per snack if hungry  Include protein in moderation with your meals and snacks Consider reading food labels for Total Carbohydrate and Fat Grams of foods Consider checking BG at alternate times per day as directed by MD  Consider taking medication as directed by MD   Blood Glucose Goals  Before breakfast:  90-130  2 hours after the start of a meal:  130-160

## 2017-02-11 NOTE — Progress Notes (Signed)
Diabetes Self-Management Education  Visit Type: First/Initial  Appt. Start Time: 1345 Appt. End Time: 1430  02/11/2017  Ms. Jennifer Bailey, identified by name and date of birth, is a 82 y.o. female with a diagnosis of Diabetes: Type 2. Other hx includes HTN, hypothyroidism, hyperlipidemia, vitamin D deficiency. Medications include Tresiba 13 units q am and Novolog 3-5 units before breakfast, lunch, and dinner.  She brought in a log of meals and blood sugars.  Low of 64 and high of 350 with unknown cause.   Patient's daughter lives with her.  They share shopping and cooking or daughter brings something from a restaurant.  She is retired from [employer].  Her husband died 2 years ago and she has been on Remeron since.  She has gained 7 lbs in the past 2 years.  She would like to lose back to 130 lbs.  She complains of craving sugar.  ASSESSMENT  Height 5' (1.524 m), weight 135 lb (61.2 kg). Body mass index is 26.37 kg/m.      Diabetes Self-Management Education - 02/11/17 1351      Visit Information   Visit Type First/Initial     Initial Visit   Diabetes Type Type 2   Are you currently following a meal plan? No   Are you taking your medications as prescribed? Yes   Date Diagnosed 2003     Health Coping   How would you rate your overall health? Good     Psychosocial Assessment   Patient Belief/Attitude about Diabetes Motivated to manage diabetes   Self-care barriers None   Self-management support Doctor's office   Other persons present Patient   Patient Concerns Nutrition/Meal planning;Glycemic Control;Weight Control   Special Needs None   Preferred Learning Style No preference indicated   Learning Readiness Ready   How often do you need to have someone help you when you read instructions, pamphlets, or other written materials from your doctor or pharmacy? 1 - Never   What is the last grade level you completed in school? 9th grade     Pre-Education Assessment   Patient understands the diabetes disease and treatment process. Needs Review   Patient understands incorporating nutritional management into lifestyle. Needs Review   Patient undertands incorporating physical activity into lifestyle. Needs Review   Patient understands using medications safely. Needs Review   Patient understands monitoring blood glucose, interpreting and using results Needs Review   Patient understands prevention, detection, and treatment of acute complications. Needs Review   Patient understands prevention, detection, and treatment of chronic complications. Needs Review   Patient understands how to develop strategies to address psychosocial issues. Needs Review   Patient understands how to develop strategies to promote health/change behavior. Needs Review     Complications   Last HgB [name] per patient/outside source 7.7 %  01/11/17   How often do you check your blood sugar? 3-4 times/day   Fasting Blood glucose range (mg/dL) >200;180-200;130-179;70-129;<70   Postprandial Blood glucose range (mg/dL) 130-179   Number of hypoglycemic episodes per month 2   Can you tell when your blood sugar is low? Yes  sometimes   What do you do if your blood sugar is low? drinks regular soda or glucose tablets and peanut butter cracker   Number of hyperglycemic episodes per week 3   Can you tell when your blood sugar is high? Yes  sometimes   What do you do if your blood sugar is high? takes more insulin   Have you  had a dilated eye exam in the past 12 months? Yes   Have you had a dental exam in the past 12 months? No  false teeth   Are you checking your feet? Yes   How many days per week are you checking your feet? 7     Dietary Intake   Breakfast English muffin, 1 egg, 1 slice cheese or 1 strip bacon or sausage and 1/2 banana OR 2 pieces raisin bran, 2T PB, 1/2 banana  7   Snack (morning) 1/2 100 calorie bag of cookies OR small slice of pound cake and berries   Lunch 1/2 Roast Beef  sandwich, salad, italina dressing, fruit chews OR ham, tomatoes, 1/2 c. grape salad, 1/2 slice apple pie, lite cool whip OR grilled chicken sandwich, salad,  12-1   Snack (afternoon) sugar free cookies or fruit snack or small frosty   Dinner [place] sandwich, pound cake , strawberries OR meat loaf, greens, cake OR hamburger steak, green beans, sweet potatoes, cake  5   Snack (evening) peanut butter and crackers, regular drink, glucose tablet (if sugar is low)   Beverage(s) unsweetened iced tea, coffee with stevia, cream, water, occasional OJ, diet soda     Exercise   Exercise Type Light (walking / raking leaves)  Mows yard once per week, weeks flowers, shops   How many days per week to you exercise? 3   How many minutes per day do you exercise? 30   Total minutes per week of exercise 90     Patient Education   Previous Diabetes Education Yes (please comment)  years ago   Disease state  Explored patient's options for treatment of their diabetes   Nutrition management  Role of diet in the treatment of diabetes and the relationship between the three main macronutrients and blood glucose level;Food label reading, portion sizes and measuring food.;Meal timing in regards to the patients' current diabetes medication.;Information on hints to eating out and maintain blood glucose control.;Meal options for control of blood glucose level and chronic complications.   Physical activity and exercise  Role of exercise on diabetes management, blood pressure control and cardiac health.;Identified with patient nutritional and/or medication changes necessary with exercise.   Medications Reviewed patients medication for diabetes, action, purpose, timing of dose and side effects.   Monitoring Identified appropriate SMBG and/or [name] goals.;Purpose and frequency of SMBG.;Other (comment)  provided new blood sugar log book   Acute complications Taught treatment of hypoglycemia - the 15 rule.;Discussed and identified  patients' treatment of hyperglycemia.   Psychosocial adjustment Worked with patient to identify barriers to care and solutions;Role of stress on diabetes;Identified and addressed patients feelings and concerns about diabetes     Individualized Goals (developed by patient)   Nutrition General guidelines for healthy choices and portions discussed   Physical Activity Exercise 5-7 days per week;30 minutes per day   Medications take my medication as prescribed   Monitoring  test my blood glucose as discussed   Reducing Risk examine blood glucose patterns;treat hypoglycemia with 15 grams of carbs if blood glucose less than 70mg /dL   Health Coping discuss diabetes with (comment)  MD/RD     Post-Education Assessment   Patient understands the diabetes disease and treatment process. Demonstrates understanding / competency   Patient understands incorporating nutritional management into lifestyle. Demonstrates understanding / competency   Patient undertands incorporating physical activity into lifestyle. Demonstrates understanding / competency   Patient understands using medications safely. Demonstrates understanding / competency   Patient  understands monitoring blood glucose, interpreting and using results Demonstrates understanding / competency   Patient understands prevention, detection, and treatment of acute complications. Demonstrates understanding / competency   Patient understands prevention, detection, and treatment of chronic complications. Demonstrates understanding / competency   Patient understands how to develop strategies to address psychosocial issues. Demonstrates understanding / competency   Patient understands how to develop strategies to promote health/change behavior. Demonstrates understanding / competency     Outcomes   Expected Outcomes Demonstrated interest in learning. Expect positive outcomes   Future DMSE PRN   Program Status Completed      Individualized Plan for  Diabetes Self-Management Training:   Learning Objective:  Patient will have a greater understanding of diabetes self-management. Patient education plan is to attend individual and/or group sessions per assessed needs and concerns.   Plan:   Patient Instructions  Stay active. Aim for 30 minutes most days. Consider reducing the amount of sweets that you eat. (cake, pie, cookies). Continue to eat regularly spaced meals.  Do not skip meals. Bake, broil, boil, or grill most days of the week Sauces on the side. See Dining out with Diabetes If your blood sugar is low, follow the "Rule of 15".  Plan:  Aim for 3 Carb Choices per meal (45 grams) +/- 1 either way  Aim for 0-15 Carbs per snack if hungry  Include protein in moderation with your meals and snacks Consider reading food labels for Total Carbohydrate and Fat Grams of foods Consider checking BG at alternate times per day as directed by MD  Consider taking medication as directed by MD   Blood Glucose Goals  Before breakfast:  90-130  2 hours after the start of a meal:  130-160   Expected Outcomes:  Demonstrated interest in learning. Expect positive outcomes  Education material provided: [name] conversion sheet, Meal plan card, My Plate and Snack sheet, Dining out with Diabetes  If problems or questions, patient to contact team via:  Phone  Future DSME appointment: PRN

## 2017-03-17 ENCOUNTER — Other Ambulatory Visit: Payer: Self-pay

## 2017-03-17 ENCOUNTER — Telehealth: Payer: Self-pay | Admitting: Endocrinology

## 2017-03-17 MED ORDER — GLUCOSE BLOOD VI STRP: ORAL_STRIP | 4 refills | Status: DC

## 2017-03-17 NOTE — Telephone Encounter (Signed)
Patient is calling to advise that [facility] is telling her that the onetouch rx we wrote indicates 3x/day. Ours shows 4x/day. I should indicate 4x/day. Please confirm to the pharmacy that they have this information .

## 2017-03-17 NOTE — Telephone Encounter (Signed)
I spoke to Jennifer Bailey at the pharmacy and this has been resolved and a new prescription has been sent

## 2017-04-12 ENCOUNTER — Other Ambulatory Visit (INDEPENDENT_AMBULATORY_CARE_PROVIDER_SITE_OTHER): Payer: PPO

## 2017-04-12 DIAGNOSIS — Z794 Long term (current) use of insulin: Secondary | ICD-10-CM | POA: Diagnosis not present

## 2017-04-12 DIAGNOSIS — E1165 Type 2 diabetes mellitus with hyperglycemia: Secondary | ICD-10-CM | POA: Diagnosis not present

## 2017-04-12 LAB — COMPREHENSIVE METABOLIC PANEL
ALT: 10 U/L (ref 0–35)
AST: 16 U/L (ref 0–37)
Albumin: 3.7 g/dL (ref 3.5–5.2)
Alkaline Phosphatase: 67 U/L (ref 39–117)
BUN: 19 mg/dL (ref 6–23)
CO2: 27 mEq/L (ref 19–32)
Calcium: 9.5 mg/dL (ref 8.4–10.5)
Chloride: 103 mEq/L (ref 96–112)
Creatinine, Ser: 0.87 mg/dL (ref 0.40–1.20)
GFR: 66.62 mL/min (ref >60.00)
Glucose, Bld: 186 mg/dL — ABNORMAL HIGH (ref 70–99)
Potassium: 4.2 mEq/L (ref 3.5–5.1)
Sodium: 136 mEq/L (ref 135–145)
Total Bilirubin: 0.3 mg/dL (ref 0.2–1.2)
Total Protein: 6.4 g/dL (ref 6.0–8.3)

## 2017-04-12 LAB — LIPID PANEL
Cholesterol: 131 mg/dL (ref 0–200)
HDL: 45 mg/dL (ref >39.00)
LDL Cholesterol: 58 mg/dL (ref 0–99)
NonHDL: 86.45
Total CHOL/HDL Ratio: 3
Triglycerides: 142 mg/dL (ref 0.0–149.0)
VLDL: 28.4 mg/dL (ref 0.0–40.0)

## 2017-04-12 LAB — MICROALBUMIN / CREATININE URINE RATIO
Creatinine,U: 23.9 mg/dL
Microalb Creat Ratio: 2.9 mg/g (ref 0.0–30.0)
Microalb, Ur: 0.7 mg/dL (ref 0.0–1.9)

## 2017-04-12 LAB — HEMOGLOBIN A1C: Hgb A1c MFr Bld: 8.3 % — ABNORMAL HIGH (ref 4.6–6.5)

## 2017-04-19 ENCOUNTER — Encounter: Payer: Self-pay | Admitting: Endocrinology

## 2017-04-19 ENCOUNTER — Ambulatory Visit (INDEPENDENT_AMBULATORY_CARE_PROVIDER_SITE_OTHER): Payer: PPO | Admitting: Endocrinology

## 2017-04-19 VITALS — BP 110/70 | HR 70 | Ht 59.0 in | Wt 136.0 lb

## 2017-04-19 DIAGNOSIS — E1165 Type 2 diabetes mellitus with hyperglycemia: Secondary | ICD-10-CM | POA: Diagnosis not present

## 2017-04-19 DIAGNOSIS — Z794 Long term (current) use of insulin: Secondary | ICD-10-CM | POA: Diagnosis not present

## 2017-04-19 MED ORDER — FREESTYLE LIBRE READER DEVI: 1.0000 | 0 refills | Status: DC

## 2017-04-19 MED ORDER — FREESTYLE LIBRE SENSOR SYSTEM MISC: 3 refills | Status: DC

## 2017-04-19 NOTE — Progress Notes (Signed)
Patient ID: Jennifer Bailey, female   DOB: 02-02-1935, 82 y.o.   MRN: [id number]   Reason for Appointment:  Follow-up of various problems   History of Present Illness    Type 2 DIABETES MELITUS, date of diagnosis: 2003  She has been on basal bolus insulin for several years but continues to have labile blood sugars She is very compliant with her insulin at mealtimes and is consistent with his insulin doses Is taking small amounts of insulin and is not on any oral hypoglycemics, appears to be completely insulin-dependent  She was changed from Levemir to [place] insulin in 1/17 because of relatively inconsistent control and [name] persistently over 8%.  RECENT history:   Insulin regimen: Tresiba 13 units;   mealtime coverage breakfast: 3-4, 4  lunch and 2-3 at supper     [name] is somewhat higher at 7.7, previously 7.5  Current blood sugar trends, problems and management:  She has been having again very inconsistent blood sugar patterns at all times but more so in the mornings  She has been prescribed for test strips a day and is checking 4 times a day  Mostly checking blood sugars more consistently at breakfast and bedtime  FASTING readings are mostly high recently and not clear why  More recently also she is having a tendency to periodic low blood sugars mostly before lunch and in the afternoon or after supper  This is despite taking only small doses of mealtime coverage; she is generally taking the same amount and not adjusting based on what she is eating  She will add another unit of blood sugars are 200 or more for her NovoLog  She tends to have rebound because of low blood sugars especially since she got check her sugar often enough and will probably overtreat with regular soft drinks  Postprandial readings are not consistently high although not often checking 2 hours after evening meal  She has some kind of exercise with the videotape or yardwork about every day  either morning or afternoon  Monitors blood glucose:  3 times a day.    Glucometer: One Touch.          Blood Glucose readings from meter download as follows   Mean values apply above for all meters except median for One Touch  PRE-MEAL Fasting Lunch Dinner Bedtime Overall  Glucose range: 75-412    .62-236  46-412   Mean/median: 163  110  159  142  157+/-77    POST-MEAL PC Breakfast PC Lunch PC Dinner  Glucose range:  52-3 29    Mean/median:  176       Symptoms with hypoglycemia: feels weak and shaky Low sugars are treated with Soft drink or 2-3 glucose tablets      Meals: 3 meals per day.  Usually has small portions. Eggs/toast at 7 am; has mid morning snack.  Her diet is somewhat variable but she is usually having balanced meals and not eating out frequently      Based on blood sugar at bedtime she may have a hs snack   Evening meal usually balanced at 5 pm with 1-2 carbohydrates  Physical activity: exercise: at home with the video program variable times          Dietician visit: Most recent: 2004            Wt Readings from Last 3 Encounters:  04/19/17 136 lb (61.7 kg)  02/11/17 135 lb (61.2 kg)  01/14/17 135  lb (67.3 kg)    Complications: are: Mild neuropathy controlled symptomatically  Lab Results  Component Value Date   HGBA1C 8.3 (H) 04/12/2017   HGBA1C 7.7 (H) 01/11/2017   HGBA1C 7.5 (H) 10/12/2016   Lab Results  Component Value Date   MICROALBUR 0.7 04/12/2017   LDLCALC 58 04/12/2017   CREATININE 0.87 04/12/2017      Vitamin D. Deficiency.  She is on a maintenance dose with 50,000 units monthly with stable results:  Lab Results  Component Value Date   VD25OH 50.84 06/25/2016   VD25OH 58.87 07/08/2015   Other active problems: See review of systems    LABS:  No visits with results within 1 Week(s) from this visit.  Latest known visit with results is:  Lab on 04/12/2017  Component Date Value Ref Range Status  . Hgb [name] MFr Bld 04/12/2017  8.3* 4.6 - 6.5 % Final   Glycemic Control Guidelines for People with Diabetes:Non Diabetic:  <6%Goal of Therapy: <7%Additional Action Suggested:  >8%   . Sodium 04/12/2017 136  135 - 145 mEq/L Final  . Potassium 04/12/2017 4.2  3.5 - 5.1 mEq/L Final  . Chloride 04/12/2017 103  96 - 112 mEq/L Final  . CO2 04/12/2017 27  19 - 32 mEq/L Final  . Glucose, Bld 04/12/2017 186* 70 - 99 mg/dL Final  . BUN 04/12/2017 19  6 - 23 mg/dL Final  . Creatinine, Ser 04/12/2017 0.87  0.40 - 1.20 mg/dL Final  . Total Bilirubin 04/12/2017 0.3  0.2 - 1.2 mg/dL Final  . Alkaline Phosphatase 04/12/2017 67  39 - 117 U/L Final  . AST 04/12/2017 16  0 - 37 U/L Final  . ALT 04/12/2017 10  0 - 35 U/L Final  . Total Protein 04/12/2017 6.4  6.0 - 8.3 g/dL Final  . Albumin 04/12/2017 3.7  3.5 - 5.2 g/dL Final  . Calcium 04/12/2017 9.5  8.4 - 10.5 mg/dL Final  . GFR 04/12/2017 66.62  >60.00 mL/min Final  . Microalb, Ur 04/12/2017 0.7  0.0 - 1.9 mg/dL Final  . Creatinine,U 04/12/2017 23.9  mg/dL Final  . Microalb Creat Ratio 04/12/2017 2.9  0.0 - 30.0 mg/g Final  . Cholesterol 04/12/2017 131  0 - 200 mg/dL Final   ATP III Classification       Desirable:  < 200 mg/dL               Borderline High:  200 - 239 mg/dL          High:  > = 240 mg/dL  . Triglycerides 04/12/2017 142.0  0.0 - 149.0 mg/dL Final   Normal:  <150 mg/dLBorderline High:  150 - 199 mg/dL  . HDL 04/12/2017 45.00  >39.00 mg/dL Final  . VLDL 04/12/2017 28.4  0.0 - 40.0 mg/dL Final  . LDL Cholesterol 04/12/2017 58  0 - 99 mg/dL Final  . Total CHOL/HDL Ratio 04/12/2017 3   Final                  Men          Women1/2 Average Risk     3.4          3.3Average Risk          5.0          4.42X Average Risk          9.6          7.13X Average Risk  15.0          11.0                      . NonHDL 04/12/2017 86.45   Final   NOTE:  Non-HDL goal should be 30 mg/dL higher than patient's LDL goal (i.e. LDL goal of < 70 mg/dL, would have non-HDL goal of < 100  mg/dL)    Allergies as of 04/19/2017      Reactions   Macrodantin [nitrofurantoin Macrocrystal]    Penicillins       Medication List       Accurate as of 04/19/17  9:50 AM. Always use your most recent med list.          Alcohol Pads 70 % Pads USE 4 PER DAY   aspirin 81 MG tablet Take 81 mg by mouth daily.   fish oil-omega-3 fatty acids 1000 MG capsule Take 2 g by mouth daily. Two a day   FLUARIX QUADRIVALENT 0.5 ML injection Generic drug:  influenza vac split quadrivalent PF   FREESTYLE LIBRE READER Devi 1 Device by Does not apply route as directed.   [place] Misc Apply to upper arm and change sensor every 10 days   gabapentin 100 MG capsule Commonly known as:  NEURONTIN TAKE 3 TO 4 CAPSULES BY MOUTH AT BEDTIME.   glucose blood test strip Commonly known as:  ONETOUCH VERIO USE TO TEST BLOOD SUGAR 4 TIMES DAILY DX code E11.65   Insulin Pen Needle 32G X 4 MM Misc Commonly known as:  BD PEN NEEDLE NANO U/F USE AS DIRECTED FIVE TIMES A DAY.   BD PEN NEEDLE NANO U/F 32G X 4 MM Misc Generic drug:  Insulin Pen Needle USE AS DIRECTED FIVE TIMES A DAY.   levothyroxine 75 MCG tablet Commonly known as:  SYNTHROID, LEVOTHROID TAKE 1 TABLET BY MOUTH DAILY BEFORE BREAKFAST, EXCEPT TAKE 1 AND 1/2 TABLETS ON SUNDAY.   losartan 50 MG tablet Commonly known as:  COZAAR TAKE 1 TABLET BY MOUTH DAILY.   lovastatin 40 MG tablet Commonly known as:  MEVACOR TAKE 1 TABLET BY MOUTH ONCE DAILY WITH A MEAL.   mirtazapine 15 MG tablet Commonly known as:  REMERON TAKE 1/2 TABLET (7.5 MG TOTAL) BY MOUTH AT BEDTIME.   NOVOLOG FLEXPEN 100 UNIT/ML FlexPen Generic drug:  insulin aspart INJECT 3 TO 5 UNITS SUBCUTANEOUSLY BEFORE MEALS AS DIRECTED.   omeprazole 20 MG capsule Commonly known as:  PRILOSEC TAKE 1 CAPSULE BY MOUTH ONCE DAILY.   PRESERVISION AREDS 2 Caps Take by mouth.   TRESIBA FLEXTOUCH 100 UNIT/ML Sopn FlexTouch Pen Generic drug:  insulin  degludec INJECT 15 UNITS INTO THE SKIN DAILY.   vitamin C 100 MG tablet Take 100 mg by mouth daily.   Vitamin D (Ergocalciferol) 50000 units Caps capsule Commonly known as:  DRISDOL TAKE 1 CAPSULE BY MOUTH ONCE A MONTH.       Allergies:  Allergies  Allergen Reactions  . Macrodantin [Nitrofurantoin Macrocrystal]   . Penicillins     Past Medical History:  Diagnosis Date  . Diabetes mellitus without complication [facility])     Past Surgical History:  Procedure Laterality Date  . ABDOMINAL HYSTERECTOMY      Family History  Problem Relation Age of Onset  . Cancer Mother   . Diabetes Brother     Social History:  reports that she has never smoked. She has never used smokeless tobacco. Her alcohol and drug  histories are not on file.  Review of Systems:     HYPERTENSION:  this has been mild and treated Adequately with a half of 50 mg losartan Checks blood pressure  periodically at home  INSOMNIA: She had been given Remeron to help her sleep especially after her husband's death but now she thinks that it is not helping her sleep, she thinks she was given another brand of generic  HYPOTHYROIDISM: She has had primary hypothyroidism for over 20 years. Her levels have been stable with 75 mcg levothyroxine 7.5 tablets per week, has been on this dose since 11/15 She feels good  Lab Results  Component Value Date   TSH 2.21 01/11/2017   TSH 1.39 06/29/2016   TSH 1.01 03/23/2016   FREET4 0.99 06/29/2016   FREET4 1.07 03/23/2016   FREET4 0.94 03/05/2015     HYPERLIPIDEMIA: The lipid abnormality consists of elevated LDL,  taking lovastatin with good control.  Lab Results  Component Value Date   CHOL 131 04/12/2017   HDL 45.00 04/12/2017   LDLCALC 58 04/12/2017   TRIG 142.0 04/12/2017   CHOLHDL 3 04/12/2017    NEUROPATHY: She is taking 200 mg of gabapentin at bedtime with relief of paresthesia in her feet and legs       Examination:   BP 110/70   Pulse 70   Ht 4'  11" (1.499 m)   Wt 136 lb (61.7 kg)   SpO2 96%   BMI 27.47 kg/m   Body mass index is 27.47 kg/m.     ASSESSMENT/ PLAN:    DIABETES on insulin:  See history of present illness for detailed discussion of current diabetes management, blood sugar patterns and problems identified  [name] has been gradually increasing and now 8.3 This is despite her home blood sugars averaging 168 only and being consistent with following her diet and exercising and taking her insulin on time She is checking her blood sugars 4 times a day as directed.  Tries to check more often if her blood sugars are abnormally high or low She is also adjusting her insulin doses based on her pre-meal blood sugar  Difficult to know what is causing her variable blood sugars fasting which are running higher more recently This is despite using a stable dose of [place] Also tends to have tendency to low blood sugars during the day and this may be from her being more active, not losing weight or having any change in appetite  Prescription has been sent for freestyle Libre sensor and meter to her mail-order supply company and will start doing this when available She was shown how to do this and instructions given to get started  Insulin changes: She will increase [place] by 1 unit now and again further up to 15 if fasting blood sugars stay over 180 She will reduce NovoLog dose by 1 unit across the board Review in 8 weeks  Mild hypertension: Controlled  Insomnia: She can try 15 mg of Remeron since 7.5 is not adequate, otherwise consider changing to trazodone   Counseling time on subjects discussed in assessment and plan sections is over 50% of today's 25 minute visit    Patient Instructions  Tresiba 14 and if am sugars are > 180 in am go to 15  One less Novolog at all times  Try 15 mg Mirtazapine at bedtime       Anna-Marie Coller 04/19/2017, 9:50 AM

## 2017-04-19 NOTE — Patient Instructions (Addendum)
Tresiba 14 and if am sugars are > 180 in am go to 15  One less Novolog at all times  Try 15 mg Mirtazapine at bedtime

## 2017-05-11 ENCOUNTER — Other Ambulatory Visit: Payer: Self-pay | Admitting: Endocrinology

## 2017-06-02 ENCOUNTER — Other Ambulatory Visit: Payer: Self-pay | Admitting: Endocrinology

## 2017-06-02 ENCOUNTER — Telehealth: Payer: Self-pay | Admitting: Endocrinology

## 2017-06-02 ENCOUNTER — Telehealth: Payer: Self-pay

## 2017-06-02 NOTE — Telephone Encounter (Signed)
Routing to you °

## 2017-06-02 NOTE — Telephone Encounter (Signed)
Patient needs to know if Dr. Dwyane Dee wants her to stay on the [place] 100 UNIT/ML SOPN or if he is changing it. Call patient to advise, okay to leave a detailed message.

## 2017-06-02 NOTE — Telephone Encounter (Signed)
For you to have the information if they call back.

## 2017-06-02 NOTE — Telephone Encounter (Signed)
Called patient and gave the number to the supply companies. Gave her our fax number to give to them for the paperwork. Patient will call and let us know what they say.,

## 2017-06-02 NOTE — Telephone Encounter (Signed)
I see OV from 04/2017, that you still had patient on Tresiba, is this still correct? Thank you!

## 2017-06-02 NOTE — Telephone Encounter (Signed)
[facility] called in reference to needing patient info for referral.   Please contact [phone]  Fax [phone]

## 2017-06-02 NOTE — Telephone Encounter (Signed)
Please give her the numbers for [facility], [place] or [facility] ([phone]) so that they can fax the form to [place] for freestyle [place]

## 2017-06-02 NOTE — Telephone Encounter (Signed)
Called patient and advised of MD note, patient has not been able to get the Freestyle yet. Will let MD know.

## 2017-06-02 NOTE — Telephone Encounter (Signed)
Called patient and advised of MD note.  Patient also states that she has not been able to get the freestyle libre yet.

## 2017-06-02 NOTE — Telephone Encounter (Signed)
There was no mention of changing it.  She was going to use 15 units if her morning sugars were staying over 180 Also has she been able to get the freestyle [place]?

## 2017-06-02 NOTE — Telephone Encounter (Signed)
Called and LVM with [facility] regarding what information they needed. Gave our call back number.

## 2017-06-08 ENCOUNTER — Other Ambulatory Visit: Payer: Self-pay | Admitting: Endocrinology

## 2017-06-08 NOTE — Telephone Encounter (Signed)
Routing to you [employer]

## 2017-06-17 NOTE — Telephone Encounter (Signed)
Patient still has not received her freestyle libre pump. She cancelled her appt w/ Dr. Dwyane Dee on 09/17, due to not having it.  Also she thought the appt was supposed to be for pump training.  TY,  -LL

## 2017-06-18 NOTE — Telephone Encounter (Signed)
Called [facility] at [phone]. I was sent to a voice message system. I will try to contact again at a later time.

## 2017-06-21 ENCOUNTER — Ambulatory Visit: Payer: PPO | Admitting: Endocrinology

## 2017-06-28 ENCOUNTER — Telehealth: Payer: Self-pay | Admitting: Endocrinology

## 2017-06-28 NOTE — Telephone Encounter (Signed)
Called patient and left a voice message to let her know that we have sent [facility] her information and we have not received anything else from them and if she can call and check to see if they need any further information and to call us back to let us know.

## 2017-06-28 NOTE — Telephone Encounter (Signed)
Patient needs a call to discuss how she is supposed to get her new [employer]. Call patient as soon as possible to advise so that she can have her medication.

## 2017-07-02 NOTE — Telephone Encounter (Signed)
Patient called in reference to speaking to [facility] and they stated to call [facility] and they could send this in for patient. Patient also needs to know when she needs to come back for an appointment.  Please call patient with any questions.

## 2017-07-09 ENCOUNTER — Other Ambulatory Visit: Payer: Self-pay | Admitting: Endocrinology

## 2017-07-13 NOTE — Telephone Encounter (Signed)
Called patient and let her know to contact her insurance company to see if they will cover the [facility] and she will call us and let us know if she wants to have [place] send a prescription to [facility] on [address] Also let her know that her lab appt is on 07/19/2017 at 2:15pm and her follow up with Dr. Dwyane Dee is on 07/26/2017 at 9:45am.

## 2017-07-15 ENCOUNTER — Other Ambulatory Visit: Payer: Self-pay

## 2017-07-15 ENCOUNTER — Telehealth: Payer: Self-pay | Admitting: Endocrinology

## 2017-07-15 MED ORDER — FREESTYLE LIBRE SENSOR SYSTEM MISC: 3 refills | Status: DC

## 2017-07-15 MED ORDER — FREESTYLE LIBRE READER DEVI: 1.0000 | 0 refills | Status: DC
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
## 2017-07-15 NOTE — Telephone Encounter (Signed)
Will likely need for DME supplier

## 2017-07-15 NOTE — Telephone Encounter (Signed)
Patient  Has called to let you know insurance will pay for her Elenor Legato, call it in to [place].

## 2017-07-19 ENCOUNTER — Other Ambulatory Visit (INDEPENDENT_AMBULATORY_CARE_PROVIDER_SITE_OTHER): Payer: PPO

## 2017-07-19 ENCOUNTER — Other Ambulatory Visit: Payer: Self-pay | Admitting: Endocrinology

## 2017-07-19 DIAGNOSIS — D638 Anemia in other chronic diseases classified elsewhere: Secondary | ICD-10-CM

## 2017-07-19 DIAGNOSIS — Z794 Long term (current) use of insulin: Secondary | ICD-10-CM | POA: Diagnosis not present

## 2017-07-19 DIAGNOSIS — H04123 Dry eye syndrome of bilateral lacrimal glands: Secondary | ICD-10-CM | POA: Diagnosis not present

## 2017-07-19 DIAGNOSIS — H40023 Open angle with borderline findings, high risk, bilateral: Secondary | ICD-10-CM | POA: Diagnosis not present

## 2017-07-19 DIAGNOSIS — H3589 Other specified retinal disorders: Secondary | ICD-10-CM | POA: Diagnosis not present

## 2017-07-19 DIAGNOSIS — E1165 Type 2 diabetes mellitus with hyperglycemia: Secondary | ICD-10-CM

## 2017-07-19 DIAGNOSIS — E063 Autoimmune thyroiditis: Secondary | ICD-10-CM | POA: Diagnosis not present

## 2017-07-19 DIAGNOSIS — Z7984 Long term (current) use of oral hypoglycemic drugs: Secondary | ICD-10-CM | POA: Diagnosis not present

## 2017-07-19 DIAGNOSIS — H18413 Arcus senilis, bilateral: Secondary | ICD-10-CM | POA: Diagnosis not present

## 2017-07-19 DIAGNOSIS — E119 Type 2 diabetes mellitus without complications: Secondary | ICD-10-CM | POA: Diagnosis not present

## 2017-07-19 DIAGNOSIS — Z961 Presence of intraocular lens: Secondary | ICD-10-CM | POA: Diagnosis not present

## 2017-07-19 DIAGNOSIS — Z9849 Cataract extraction status, unspecified eye: Secondary | ICD-10-CM | POA: Diagnosis not present

## 2017-07-19 DIAGNOSIS — H11423 Conjunctival edema, bilateral: Secondary | ICD-10-CM | POA: Diagnosis not present

## 2017-07-19 DIAGNOSIS — H353132 Nonexudative age-related macular degeneration, bilateral, intermediate dry stage: Secondary | ICD-10-CM | POA: Diagnosis not present

## 2017-07-19 DIAGNOSIS — H40003 Preglaucoma, unspecified, bilateral: Secondary | ICD-10-CM | POA: Diagnosis not present

## 2017-07-19 DIAGNOSIS — H353131 Nonexudative age-related macular degeneration, bilateral, early dry stage: Secondary | ICD-10-CM | POA: Diagnosis not present

## 2017-07-19 LAB — COMPREHENSIVE METABOLIC PANEL
ALT: 10 U/L (ref 0–35)
AST: 18 U/L (ref 0–37)
Albumin: 3.8 g/dL (ref 3.5–5.2)
Alkaline Phosphatase: 73 U/L (ref 39–117)
BUN: 15 mg/dL (ref 6–23)
CO2: 27 mEq/L (ref 19–32)
Calcium: 9.2 mg/dL (ref 8.4–10.5)
Chloride: 105 mEq/L (ref 96–112)
Creatinine, Ser: 0.8 mg/dL (ref 0.40–1.20)
GFR: 73.34 mL/min (ref >60.00)
Glucose, Bld: 270 mg/dL — ABNORMAL HIGH (ref 70–99)
Potassium: 4.4 mEq/L (ref 3.5–5.1)
Sodium: 138 mEq/L (ref 135–145)
Total Bilirubin: 0.4 mg/dL (ref 0.2–1.2)
Total Protein: 6.6 g/dL (ref 6.0–8.3)

## 2017-07-19 LAB — CBC WITH DIFFERENTIAL/PLATELET
Basophils Absolute: 0.1 10*3/uL (ref 0.0–0.1)
Basophils Relative: 1.2 % (ref 0.0–3.0)
Eosinophils Absolute: 0.4 10*3/uL (ref 0.0–0.7)
Eosinophils Relative: 5.4 % — ABNORMAL HIGH (ref 0.0–5.0)
HCT: 34.5 % — ABNORMAL LOW (ref 36.0–46.0)
Hemoglobin: 11.6 g/dL — ABNORMAL LOW (ref 12.0–15.0)
Lymphocytes Relative: 26.6 % (ref 12.0–46.0)
Lymphs Abs: 1.9 10*3/uL (ref 0.7–4.0)
MCHC: 33.8 g/dL (ref 30.0–36.0)
MCV: 91.5 fl (ref 78.0–100.0)
Monocytes Absolute: 0.5 10*3/uL (ref 0.1–1.0)
Monocytes Relative: 7.7 % (ref 3.0–12.0)
Neutro Abs: 4.1 10*3/uL (ref 1.4–7.7)
Neutrophils Relative %: 59.1 % (ref 43.0–77.0)
Platelets: 251 10*3/uL (ref 150.0–400.0)
RBC: 3.77 Mil/uL — ABNORMAL LOW (ref 3.87–5.11)
RDW: 14.2 % (ref 11.5–15.5)
WBC: 7 10*3/uL (ref 4.0–10.5)

## 2017-07-19 LAB — HEMOGLOBIN A1C: Hgb A1c MFr Bld: 7.8 % — ABNORMAL HIGH (ref 4.6–6.5)

## 2017-07-19 LAB — T4, FREE: Free T4: 1.03 ng/dL (ref 0.60–1.60)

## 2017-07-19 LAB — TSH: TSH: 2.82 u[IU]/mL (ref 0.35–4.50)

## 2017-07-26 ENCOUNTER — Encounter: Payer: Self-pay | Admitting: Endocrinology

## 2017-07-26 ENCOUNTER — Ambulatory Visit (INDEPENDENT_AMBULATORY_CARE_PROVIDER_SITE_OTHER): Payer: PPO | Admitting: Endocrinology

## 2017-07-26 VITALS — BP 130/74 | HR 72 | Ht 58.75 in | Wt 137.2 lb

## 2017-07-26 DIAGNOSIS — Z0000 Encounter for general adult medical examination without abnormal findings: Secondary | ICD-10-CM | POA: Diagnosis not present

## 2017-07-26 DIAGNOSIS — E1165 Type 2 diabetes mellitus with hyperglycemia: Secondary | ICD-10-CM

## 2017-07-26 DIAGNOSIS — Z794 Long term (current) use of insulin: Secondary | ICD-10-CM

## 2017-07-26 DIAGNOSIS — D638 Anemia in other chronic diseases classified elsewhere: Secondary | ICD-10-CM | POA: Diagnosis not present

## 2017-07-26 DIAGNOSIS — Z23 Encounter for immunization: Secondary | ICD-10-CM | POA: Diagnosis not present

## 2017-07-26 DIAGNOSIS — E063 Autoimmune thyroiditis: Secondary | ICD-10-CM

## 2017-07-26 LAB — VITAMIN B12: Vitamin B-12: 311 pg/mL (ref 211–911)

## 2017-07-26 LAB — IBC PANEL
Iron: 49 ug/dL (ref 42–145)
Saturation Ratios: 17.1 % — ABNORMAL LOW (ref 20.0–50.0)
Transferrin: 205 mg/dL — ABNORMAL LOW (ref 212.0–360.0)

## 2017-07-26 NOTE — Patient Instructions (Signed)
Tresiba 14 daily

## 2017-07-26 NOTE — Progress Notes (Signed)
Patient ID: Jennifer Bailey, female   DOB: 1935/10/05, 82 y.o.   MRN: [id number]   Reason for Appointment:  Complete physical exam and multiple  Chronic issues   History of Present Illness    Problem 1:     Type 2 DIABETES MELITUS, date of diagnosis: 2003  She has been on insulin for several years but continues to have labile blood sugars despite using basal bolus regimen She is very compliant with her insulin at mealtimes and is consistent with the timing of her Levemir insulin twice a day Is taking small amounts of insulin and is not on any oral hypoglycemics, appears to be completely insulin-dependent Her diet is somewhat variable but she is usually having balanced meals and not eating out excessively      RECENT history:   Insulin regimen: Tresiba 14-15 units;   mealtime coverage breakfast:  2-3 units breakfast , 4  lunch and 3-4 at supper    She was changed from Levemir to [place] insulin in 1/17 because of relatively inconsistent control and [name] persistently over 8%.  Current blood sugar trends, management and problems identified:   She continues to have labile blood sugars at all times  Overall fasting blood sugars at the lowest of the day and she was having some mild hypoglycemia waking up also but none in the last 10 days or so  She said that when her blood sugars are higher in the morning she will take 15 units of Tresiba otherwise 14  Also her breakfast coverage was reduced down to 2 units because of tendency to low sugars after her morning meal and she is not having this currently  Blood sugars still show considerable fluctuation around lunchtime and suppertime but not as much at bedtime  Even though she was told her freestyle Elenor Legato was covered she is not able to get this at [facility] and another supplier  She is generally adjusting her mealtime doses based on what she is eating one unit or so but still has difficulty getting consistent postprandial  control  She has usually tried to be active also but this does not appear to bring her blood sugar down to much  Occasionally such as last night may have high-fat meal and blood sugar was 280 this morning Hypoglycemia has been only infrequently occurring at suppertime and only once at lunchtime and bedtime     Has 9.4 % readings below target       Proper timing of medications in relation to meals: Yes, will take NovoLog after eating a blood sugar low before meal.   Will take insulin with her when she is eating out .          Monitors blood glucose:  up to 4 times a day.    Glucometer: One Touch.          Blood Glucose readings from meter download:   Mean values apply above for all meters except median for One Touch   PRE-MEAL Fasting Lunch Dinner Bedtime Overall  Glucose range: 56-280    48-3 73    Mean/median: 106  130  105  164  143    Symptoms with hypoglycemia: feels weeak and shaky Low sugars are treated with Soft drink or glucose tablets      Meals: 3 meals per day.  Usually has small portions. Eggs or waffle with protein at 7 am; has mid morning snack. Occ hs snack   Evening meal usually balanced at 5  pm with 1-2 carbohydrates Physical activity: exercise: walking 2/7 and some gardening           Dietician visit: Most recent: 2004            Wt Readings from Last 3 Encounters:  07/26/17 137 lb 3.2 oz (62.2 kg)  04/19/17 136 lb (61.7 kg)  02/11/17 135 lb (67.3 kg)   Complications: are: Mild neuropathy controlled symptomatically  Lab Results  Component Value Date   HGBA1C 7.8 (H) 07/19/2017   HGBA1C 8.3 (H) 04/12/2017   HGBA1C 7.7 (H) 01/11/2017   Lab Results  Component Value Date   MICROALBUR 0.7 04/12/2017   LDLCALC 58 04/12/2017   CREATININE 0.80 07/19/2017     HYPERTENSION:  this has been mild and treated with a half of the 50 mg losartan.  She is checking her blood pressure at home less frequently Most of her readings at home are about 419 systolic      OSTEOPENIA:  She has had osteopenia with T score -2.3 at the hip in 3/09 and was started on Fosamax at that time.   This was stopped in 5/15.   No history of fracture  follow-up bone density in 2016: DualFemur Neck Right 10/03/2015 T score -1.9  Has been given 50,000 units vitamin D every 4 weeks for vitamin D. Deficiency  Lab Results  Component Value Date   VD25OH 50.84 06/25/2016   VD25OH 58.87 07/08/2015    OTHER active problems: See review of systems   PREVENTIVE CARE:   Annual hemoccults:           2017   Breast self exams:                              regular  Colonoscopy/sigmoidoscopy  04/04/14  Mammograms:   4/18  Yearly flu vaccine:                      yes   Bone Density:   2016, T score -2.3  Calcium supplements:  taking  Tetanus booster:  ?   Zostavax: 2003 Pneumovax [place]: 2005 /2015   Diet: Generally low fat Exercise: Yard work, some indoor exercises or walking History of falls: None    LABS:  No visits with results within 1 Week(s) from this visit.  Latest known visit with results is:  Lab on 07/19/2017  Component Date Value Ref Range Status  . Hgb [name] MFr Bld 07/19/2017 7.8* 4.6 - 6.5 % Final   Glycemic Control Guidelines for People with Diabetes:Non Diabetic:  <6%Goal of Therapy: <7%Additional Action Suggested:  >8%   . Sodium 07/19/2017 138  135 - 145 mEq/L Final  . Potassium 07/19/2017 4.4  3.5 - 5.1 mEq/L Final  . Chloride 07/19/2017 105  96 - 112 mEq/L Final  . CO2 07/19/2017 27  19 - 32 mEq/L Final  . Glucose, Bld 07/19/2017 270* 70 - 99 mg/dL Final  . BUN 07/19/2017 15  6 - 23 mg/dL Final  . Creatinine, Ser 07/19/2017 0.80  0.40 - 1.20 mg/dL Final  . Total Bilirubin 07/19/2017 0.4  0.2 - 1.2 mg/dL Final  . Alkaline Phosphatase 07/19/2017 73  39 - 117 U/L Final  . AST 07/19/2017 18  0 - 37 U/L Final  . ALT 07/19/2017 10  0 - 35 U/L Final  . Total Protein 07/19/2017 6.6  6.0 - 8.3 g/dL Final  . Albumin 07/19/2017 3.8  3.5 - 5.2  g/dL Final  . Calcium 07/19/2017 9.2  8.4 - 10.5 mg/dL Final  . GFR 07/19/2017 73.34  >60.00 mL/min Final  . TSH 07/19/2017 2.82  0.35 - 4.50 uIU/mL Final  . Free T4 07/19/2017 1.03  0.60 - 1.60 ng/dL Final   Comment: Specimens from patients who are undergoing biotin therapy and /or ingesting biotin supplements may contain high levels of biotin.  The higher biotin concentration in these specimens interferes with this Free T4 assay.  Specimens that contain high levels  of biotin may cause false high results for this Free T4 assay.  Please interpret results in light of the total clinical presentation of the patient.    . WBC 07/19/2017 7.0  4.0 - 10.5 K/uL Final  . RBC 07/19/2017 3.77* 3.87 - 5.11 Mil/uL Final  . Hemoglobin 07/19/2017 11.6* 12.0 - 15.0 g/dL Final  . HCT 07/19/2017 34.5* 36.0 - 46.0 % Final  . MCV 07/19/2017 91.5  78.0 - 100.0 fl Final  . MCHC 07/19/2017 33.8  30.0 - 36.0 g/dL Final  . RDW 07/19/2017 14.2  11.5 - 15.5 % Final  . Platelets 07/19/2017 251.0  150.0 - 400.0 K/uL Final  . Neutrophils Relative % 07/19/2017 59.1  43.0 - 77.0 % Final  . Lymphocytes Relative 07/19/2017 26.6  12.0 - 46.0 % Final  . Monocytes Relative 07/19/2017 7.7  3.0 - 12.0 % Final  . Eosinophils Relative 07/19/2017 5.4* 0.0 - 5.0 % Final  . Basophils Relative 07/19/2017 1.2  0.0 - 3.0 % Final  . Neutro Abs 07/19/2017 4.1  1.4 - 7.7 K/uL Final  . Lymphs Abs 07/19/2017 1.9  0.7 - 4.0 K/uL Final  . Monocytes Absolute 07/19/2017 0.5  0.1 - 1.0 K/uL Final  . Eosinophils Absolute 07/19/2017 0.4  0.0 - 0.7 K/uL Final  . Basophils Absolute 07/19/2017 0.1  0.0 - 0.1 K/uL Final    Allergies as of 07/26/2017      Reactions   Macrodantin [nitrofurantoin Macrocrystal]    Penicillins       Medication List       Accurate as of 07/26/17 11:06 AM. Always use your most recent med list.          Alcohol Pads 70 % Pads USE 4 PER DAY   aspirin 81 MG tablet Take 81 mg by mouth daily.   fish  oil-omega-3 fatty acids 1000 MG capsule Take 2 g by mouth daily. Two a day   FLUARIX QUADRIVALENT 0.5 ML injection Generic drug:  influenza vac split quadrivalent PF   FREESTYLE LIBRE READER Devi 1 Device by Does not apply route as directed.   Keith Misc Apply to upper arm and change sensor every 10 days   gabapentin 100 MG capsule Commonly known as:  NEURONTIN TAKE 3 TO 4 CAPSULES BY MOUTH AT BEDTIME.   glucose blood test strip Commonly known as:  ONETOUCH VERIO USE TO TEST BLOOD SUGAR 4 TIMES DAILY DX code E11.65   Insulin Pen Needle 32G X 4 MM Misc Commonly known as:  BD PEN NEEDLE NANO U/F USE AS DIRECTED FIVE TIMES A DAY.   BD PEN NEEDLE NANO U/F 32G X 4 MM Misc Generic drug:  Insulin Pen Needle USE AS DIRECTED FIVE TIMES A DAY.   levothyroxine 75 MCG tablet Commonly known as:  SYNTHROID, LEVOTHROID TAKE 1 TABLET BY MOUTH DAILY BEFORE BREAKFAST, EXCEPT TAKE 1 AND 1/2 TABLETS ON SUNDAY.   losartan 50 MG tablet Commonly known  as:  COZAAR TAKE 1 TABLET BY MOUTH DAILY.   lovastatin 40 MG tablet Commonly known as:  MEVACOR TAKE 1 TABLET BY MOUTH ONCE DAILY WITH A MEAL.   mirtazapine 15 MG tablet Commonly known as:  REMERON TAKE 1/2 TABLET (7.5 MG TOTAL) BY MOUTH AT BEDTIME.   NOVOLOG FLEXPEN 100 UNIT/ML FlexPen Generic drug:  insulin aspart INJECT 3 TO 5 UNITS SUBCUTANEOUSLY BEFORE MEALS AS DIRECTED.   omeprazole 20 MG capsule Commonly known as:  PRILOSEC TAKE 1 CAPSULE BY MOUTH ONCE DAILY.   PRESERVISION AREDS 2 Caps Take by mouth.   TRESIBA FLEXTOUCH 100 UNIT/ML Sopn FlexTouch Pen Generic drug:  insulin degludec INJECT 15 UNITS INTO THE SKIN DAILY.   vitamin C 100 MG tablet Take 100 mg by mouth daily.   Vitamin D (Ergocalciferol) 50000 units Caps capsule Commonly known as:  DRISDOL TAKE 1 CAPSULE BY MOUTH ONCE A MONTH.       Allergies:  Allergies  Allergen Reactions  . Macrodantin [Nitrofurantoin Macrocrystal]   .  Penicillins     Past Medical History:  Diagnosis Date  . Diabetes mellitus without complication [facility])     Past Surgical History:  Procedure Laterality Date  . ABDOMINAL HYSTERECTOMY      Family History  Problem Relation Age of Onset  . Cancer Mother   . Diabetes Brother     Social History:  reports that she has never smoked. She has never used smokeless tobacco. Her alcohol and drug histories are not on file. :  Review of Systems    Constitutional: Negative for Weight loss.    Wt Readings from Last 3 Encounters:  07/26/17 137 lb 3.2 oz (62.2 kg)  04/19/17 136 lb (61.7 kg)  02/11/17 135 lb (61.2 kg)   Eyes: Negative for blurred vision.  Recently had normal exam   Respiratory: Negative for cough and shortness of breath.   Cardiovascular: Negative for chest pain, palpitations and leg swelling.   Gastrointestinal: Negative for nausea and abdominal pain.       Rarely will have Constipation, no diarrhea  Occasionally seen by the gastroenterologist as needed Has history of chronic reflux controlled with Prilosec  every other day  Genitourinary: Negative for frequency.       Nocturia only  rarely.  Has minimal incontinence and wears a pad  Musculoskeletal: Negative for myalgias, back pain and joint pain.  Skin: Negative for rash.  Neurological: Negative for tingling and headaches.       No numbness in hands or feet She has had burning in her feet and occasional pain in her legs.  She takes 2 gabapentin at night to relieve symptoms  Endo/Heme/Allergies: Negative for polydipsia.       Take 7.5-15 hs  Ca D qd  Psychiatric/Behavioral: Negative for memory loss.     Taking either 7.5 or 15 of Remeron to help her sleep Also has good control of her mild depression with this  She had a positive Hemoccult but colonoscopy showed only hemorrhoids in 7/15  ANEMIA: Her hemoglobin is mildly low.  She had mild anemia in 2015 with had normal iron and B12 at that time   Lab  Results  Component Value Date   WBC 7.0 07/19/2017   HGB 11.6 (L) 07/19/2017   HCT 34.5 (L) 07/19/2017   MCV 91.5 07/19/2017   PLT 251.0 07/19/2017    HYPOTHYROIDISM: She has had primary hypothyroidism for over 20 years. Her levels have been stable with 75 mcg levothyroxine 7.5  tablets per week, has been on this dose since 11/15   Lab Results  Component Value Date   FREET4 1.03 07/19/2017   FREET4 0.99 06/29/2016   FREET4 1.07 03/23/2016   TSH 2.82 07/19/2017   TSH 2.21 01/11/2017   TSH 1.39 06/29/2016     HYPERLIPIDEMIA: The lipid abnormality consists of elevated LDL,  taking lovastatin with good control.  Lab Results  Component Value Date   CHOL 131 04/12/2017   HDL 45.00 04/12/2017   LDLCALC 58 04/12/2017   TRIG 142.0 04/12/2017   CHOLHDL 3 04/12/2017        Examination:   BP 130/74   Pulse 72   Ht 4\' 11"  (1.499 m)   Wt 137 lb 3.2 oz (62.2 kg)   SpO2 98%   BMI 27.71 kg/m   Body mass index is 27.71 kg/m.    GENERAL: Has a small build   No pallor, cervical clubbing, lymphadenopathy or edema.    EYES:  Externally normal.  Fundii:  normal discs and vessels,) is not as well seen   ENT: Mouth & pharynx show normal appearance.  THYROID:  Not palpable.  CAROTIDS:  Normal character; no bruit.  BREAST exam: No mass palpable on either side.   HEART:  Normal apex, S1 and S2; no murmur or click.  CHEST:  Normal shape.  Lungs:  Vescicular breath sounds heard equally.   No crepitations/ wheeze.  ABDOMEN:  No distention.  Liver and spleen not palpable.  No other mass or tenderness.  No abnormal pulsation in epigastrium.  RECTAL exam:  not indicated  NEUROLOGICAL:  She has a mild resting tremor of her head and mild hesitancy in her voice Gait is normal Motor power grossly normal in all 4 extremities Vibration sense at toes is moderately reduced on the right and somewhat more on the left.   Reflexes  at ankles are difficult to elicit Biceps reflexes are  brisk  Diabetic Foot Exam - Simple   Simple Foot Form Diabetic Foot exam was performed with the following findings:  Yes 07/26/2017 11:03 AM  Visual Inspection No deformities, no ulcerations, no other skin breakdown bilaterally:  Yes Sensation Testing Intact to touch and monofilament testing bilaterally:  Yes Pulse Check Posterior Tibialis and Dorsalis pulse intact bilaterally:  Yes Comments     SPINE AND JOINTS:  minimal osteoarthritis in hands.  Spine shows only mild kyphosis  Skin:  no rash. Has multiple benign brownish pigmented lesions on back    ASSESSMENT/ PLAN:   Diabetes type 2 on insulin   See history of present illness for detailed discussion of current management, blood sugar patterns and problems identified Her [name] is slightly better and below 8%, normally unable to get below 7   Difficult to control her diabetes because of  significant variability in her blood sugars She is not doing carbohydrate counting and blood sugars maybe somewhat dependent on her diet including high-fat meals occasionally She is very compliant with her insulin doses especially mealtime doses He does have some tendency to mild hyperglycemia before meals including in the morning although fasting readings are not slow recently Also has been adjusting her [place] frequently when her blood sugar is high rather than every 2 days  fatigue and variability in her blood sugars   Her blood sugars are overall higher than on her last visit and not clear why Most of her hyperglycemia is unexpected and  Not explained with dietary changes She is generally eating balanced meals  and avoiding excess carbohydrates and fat Does not tend to get hypoglycemia with any activity  Her blood sugars are not getting low as much also  Recommendations made today:   Increase Tresiba by 1 unit only fasting blood sugars are consistently high for several days Otherwise she will take 14 units daily She will look at the  Omnipod insulin pump.  Discussed in detail how this works and how it would be beneficial in place of multiple injections.  She can probably use preset doses for meal coverage She will look into this and call if interested   DIABETIC neuropathy: Symptomatically controlled with gabapentin 200 mg hs  History of colon polyps and new anemia : She needs to have a Hemoccult done   Hyperlipidemia: Well controlled as of 7/18   Hypertension: Her blood pressure is well-controlled with only 25 mg losartan EKG to be done next year  Hypothyroidism :her TSH is normal with regimen of 75 g, 7-1/2 tablets a week; she will have level checked again on the next visit    INSOMNIA/mild depression: Recently well-controlled and she can continue taking Remeron as above    Osteopenia/vitamin D deficiency: Bone density has been stable, has previously taken bisphosphonate drugs for several years  She will continue taking vitamin D monthly and will have follow-up level done on the next visit  Discussed general preventive care measures she is doing well with  exercise and fairly good diet usually  She will get Pneumovax mostly on the next visit and will consider doing the new shingles vaccine also  Continue to get regular mammograms and  recheck bone density next year    Patient Instructions  Tresiba 14 daily   High-dose influenza vaccine given    Traveion Ruddock 07/26/2017, 11:06 AM    Addendum: Saturation 17%, she will start ferrous sulfate and vitamin C  Elimelech Houseman

## 2017-09-02 ENCOUNTER — Telehealth: Payer: Self-pay | Admitting: Endocrinology

## 2017-09-02 NOTE — Telephone Encounter (Signed)
Please advise 

## 2017-09-02 NOTE — Telephone Encounter (Signed)
Her next complete physical will be in October 2019.  She will keep her appointment in January

## 2017-09-02 NOTE — Telephone Encounter (Signed)
Pt says she is not due for physical so she canceled 09/06/17. Please verify and call pt to confirm when next physical should be.

## 2017-09-03 NOTE — Telephone Encounter (Signed)
Called patient and let her know the message from Dr. Dwyane Dee. I have also scheduled her for a lab appointment since I did not see one already scheduled and she asked about it.

## 2017-09-06 ENCOUNTER — Encounter: Payer: PPO | Admitting: Endocrinology

## 2017-09-08 ENCOUNTER — Other Ambulatory Visit (INDEPENDENT_AMBULATORY_CARE_PROVIDER_SITE_OTHER): Payer: PPO

## 2017-09-08 DIAGNOSIS — Z1211 Encounter for screening for malignant neoplasm of colon: Secondary | ICD-10-CM

## 2017-09-10 LAB — FECAL OCCULT BLOOD, IMMUNOCHEMICAL: Fecal Occult Bld: NEGATIVE

## 2017-09-21 ENCOUNTER — Other Ambulatory Visit: Payer: Self-pay | Admitting: Endocrinology

## 2017-10-18 ENCOUNTER — Other Ambulatory Visit (INDEPENDENT_AMBULATORY_CARE_PROVIDER_SITE_OTHER): Payer: Medicare Other

## 2017-10-18 DIAGNOSIS — E1165 Type 2 diabetes mellitus with hyperglycemia: Secondary | ICD-10-CM

## 2017-10-18 DIAGNOSIS — D638 Anemia in other chronic diseases classified elsewhere: Secondary | ICD-10-CM | POA: Diagnosis not present

## 2017-10-18 DIAGNOSIS — Z794 Long term (current) use of insulin: Secondary | ICD-10-CM

## 2017-10-18 LAB — BASIC METABOLIC PANEL
BUN: 12 mg/dL (ref 6–23)
CO2: 26 mEq/L (ref 19–32)
Calcium: 9.7 mg/dL (ref 8.4–10.5)
Chloride: 102 mEq/L (ref 96–112)
Creatinine, Ser: 0.74 mg/dL (ref 0.40–1.20)
GFR: 80.19 mL/min (ref >60.00)
Glucose, Bld: 166 mg/dL — ABNORMAL HIGH (ref 70–99)
Potassium: 4.4 mEq/L (ref 3.5–5.1)
Sodium: 136 mEq/L (ref 135–145)

## 2017-10-18 LAB — CBC WITH DIFFERENTIAL/PLATELET
Basophils Absolute: 0 10*3/uL (ref 0.0–0.1)
Basophils Relative: 0.7 % (ref 0.0–3.0)
Eosinophils Absolute: 0.3 10*3/uL (ref 0.0–0.7)
Eosinophils Relative: 4.3 % (ref 0.0–5.0)
HCT: 36.8 % (ref 36.0–46.0)
Hemoglobin: 12.2 g/dL (ref 12.0–15.0)
Lymphocytes Relative: 26.8 % (ref 12.0–46.0)
Lymphs Abs: 2 10*3/uL (ref 0.7–4.0)
MCHC: 33.1 g/dL (ref 30.0–36.0)
MCV: 91.3 fl (ref 78.0–100.0)
Monocytes Absolute: 0.6 10*3/uL (ref 0.1–1.0)
Monocytes Relative: 8.3 % (ref 3.0–12.0)
Neutro Abs: 4.4 10*3/uL (ref 1.4–7.7)
Neutrophils Relative %: 59.9 % (ref 43.0–77.0)
Platelets: 269 10*3/uL (ref 150.0–400.0)
RBC: 4.03 Mil/uL (ref 3.87–5.11)
RDW: 14.3 % (ref 11.5–15.5)
WBC: 7.4 10*3/uL (ref 4.0–10.5)

## 2017-10-24 NOTE — Progress Notes (Signed)
Patient ID: Jennifer Bailey, female   DOB: 06-Aug-1935, 83 y.o.   MRN: [id number]   Chief complaint: Follow-up of diabetes and other problems   History of Present Illness    Problem 1:    Type 2 DIABETES MELITUS, date of diagnosis: 2003  She has been on insulin for several years but continues to have labile blood sugars despite using basal bolus regimen She is very compliant with her insulin at mealtimes and is consistent with the timing of her Levemir insulin twice a day Is taking small amounts of insulin and is not on any oral hypoglycemics, appears to be completely insulin-dependent Her diet is somewhat variable but she is usually having balanced meals and not eating out excessively      RECENT history:   Insulin regimen: Tresiba 14 units;   mealtime coverage breakfast:  2-3 units breakfast , 4  lunch and 2-3 at supper    She was changed from Levemir to [place] insulin in 1/17 because of relatively inconsistent control   [name] persistently over 7.5% but now 7.2 and improving  Current blood sugar trends, management and problems identified:   She continues to have extreme variability in blood sugars at all times  She was told to cut back on her NovoLog at suppertime because of some low normal readings at bedtime but she still has a few low normal or low readings at bedtime or after supper  Also she cannot explain some of the extremely high readings at least over 250 at all different times  Even with cutting back on her TRESIBA by 1 unit in October she still has low normal readings periodically in the mornings and occasionally at suppertime also  She is doing some physical activities but this is at variable times and does not think it makes her glucose get low  She is generally adjusting her mealtime doses based on what she is eating one unit or so but still has difficulty getting consistent postprandial control  Occasionally may have a higher fat meal but she thinks  she is getting back on portions recently Hypoglycemia has been occurring sporadically either in the morning, before or after supper was about 3 low readings after supper and no low readings in the morning since 10/06/17     Has 8.5 % readings below target       Proper timing of medications in relation to meals: Yes, will take NovoLog after eating a blood sugar low before meal.   Will take insulin with her when she is eating out .          Monitors blood glucose:  up to 4 times a day.    Glucometer: One Touch.          Blood Glucose readings from meter download:   Mean values apply above for all meters except median for One Touch  PRE-MEAL Fasting Lunch Dinner Bedtime Overall  Glucose range: 57-277  100-329  50-313  45-312    Mean/median: 106 168  136  137  131     Symptoms with hypoglycemia: feels weak and shaky Low sugars are treated with Soft drink or glucose tablets      Meals: 3 meals per day.  Usually has small portions. Eggs or waffle with protein at 7 am; has mid morning snack. Occ hs snack   Evening meal usually balanced at 5 pm with 1-2 carbohydrates  Physical activity: exercise: walking 2/7 and some indoor exercises recently  Dietician visit: Most recent: 2004            Wt Readings from Last 3 Encounters:  10/25/17 137 lb (62.1 kg)  07/26/17 137 lb 3.2 oz (62.2 kg)  04/19/17 136 lb (94.8 kg)   Complications: are: Mild neuropathy controlled symptomatically  Lab Results  Component Value Date   HGBA1C 7.2 10/25/2017   HGBA1C 7.8 (H) 07/19/2017   HGBA1C 8.3 (H) 04/12/2017   Lab Results  Component Value Date   MICROALBUR 0.7 04/12/2017   LDLCALC 58 04/12/2017   CREATININE 0.74 10/18/2017     HYPERTENSION:  this has been mild and treated with a half of the 50 mg losartan.  She is checking her blood pressure at home less frequently Most of her readings at home are about 546 systolic     OSTEOPENIA:  She has had osteopenia with T score -2.3 at the  hip in 3/09 and was started on Fosamax at that time.   This was stopped in 5/15.   No history of fracture  follow-up bone density in 2016: DualFemur Neck Right 10/03/2015 T score -1.9  Has been given 50,000 units vitamin D every 4 weeks for vitamin D. Deficiency  Lab Results  Component Value Date   VD25OH 50.84 06/25/2016   VD25OH 58.87 07/08/2015    OTHER active problems: See review of systems   PREVENTIVE CARE:   Annual hemoccults:           2017   Breast self exams:                              regular  Colonoscopy/sigmoidoscopy  04/04/14  Mammograms:   4/18  Yearly flu vaccine:                      yes   Bone Density:   2016, T score -2.3  Calcium supplements:  taking  Tetanus booster:  ?   Zostavax: 2003 Pneumovax [place]: 2005 /2015   Diet: Generally low fat Exercise: Yard work, some indoor exercises or walking History of falls: None    LABS:  Office Visit on 10/25/2017  Component Date Value Ref Range Status  . Hemoglobin [name] 10/25/2017 7.2   Final    Allergies as of 10/25/2017      Reactions   Macrodantin [nitrofurantoin Macrocrystal]    Penicillins       Medication List        Accurate as of 10/25/17  5:04 PM. Always use your most recent med list.          Alcohol Pads 70 % Pads USE 4 PER DAY   aspirin 81 MG tablet Take 81 mg by mouth daily.   fish oil-omega-3 fatty acids 1000 MG capsule Take 2 g by mouth daily. Two a day   FLUARIX QUADRIVALENT 0.5 ML injection Generic drug:  influenza vac split quadrivalent PF   FREESTYLE LIBRE READER Devi 1 Device by Does not apply route as directed.   [place] Misc Apply to upper arm and change sensor every 10 days   gabapentin 100 MG capsule Commonly known as:  NEURONTIN TAKE 3 TO 4 CAPSULES BY MOUTH AT BEDTIME.   Insulin Pen Needle 32G X 4 MM Misc Commonly known as:  BD PEN NEEDLE NANO U/F USE AS DIRECTED FIVE TIMES A DAY.   BD PEN NEEDLE NANO U/F 32G X 4 MM  Misc Generic  drug:  Insulin Pen Needle USE AS DIRECTED FIVE TIMES A DAY.   levothyroxine 75 MCG tablet Commonly known as:  SYNTHROID, LEVOTHROID TAKE 1 TABLET BY MOUTH DAILY BEFORE BREAKFAST, EXCEPT TAKE 1 AND 1/2 TABLETS ON SUNDAY.   losartan 50 MG tablet Commonly known as:  COZAAR TAKE 1 TABLET BY MOUTH DAILY.   lovastatin 40 MG tablet Commonly known as:  MEVACOR TAKE 1 TABLET BY MOUTH ONCE DAILY WITH A MEAL.   mirtazapine 15 MG tablet Commonly known as:  REMERON TAKE 1/2 TABLET (7.5 MG TOTAL) BY MOUTH AT BEDTIME.   NOVOLOG FLEXPEN 100 UNIT/ML FlexPen Generic drug:  insulin aspart INJECT 3 TO 5 UNITS SUBCUTANEOUSLY BEFORE MEALS AS DIRECTED.   omeprazole 20 MG capsule Commonly known as:  PRILOSEC TAKE 1 CAPSULE BY MOUTH ONCE DAILY.   ONETOUCH VERIO test strip Generic drug:  glucose blood USE TO TEST BLOOD SUGAR 4 TIMES DAILY   PRESERVISION AREDS 2 Caps Take by mouth.   TRESIBA FLEXTOUCH 100 UNIT/ML Sopn FlexTouch Pen Generic drug:  insulin degludec INJECT 15 UNITS INTO THE SKIN DAILY.   vitamin C 100 MG tablet Take 100 mg by mouth daily.   Vitamin D (Ergocalciferol) 50000 units Caps capsule Commonly known as:  DRISDOL TAKE 1 CAPSULE BY MOUTH ONCE A MONTH.       Allergies:  Allergies  Allergen Reactions  . Macrodantin [Nitrofurantoin Macrocrystal]   . Penicillins     Past Medical History:  Diagnosis Date  . Diabetes mellitus without complication [facility])     Past Surgical History:  Procedure Laterality Date  . ABDOMINAL HYSTERECTOMY      Family History  Problem Relation Age of Onset  . Cancer Mother   . Diabetes Brother     Social History:  reports that  has never smoked. she has never used smokeless tobacco. Her alcohol and drug histories are not on file. :  Review of Systems    She previously had a positive Hemoccult but colonoscopy showed only hemorrhoids in 7/15  ANEMIA: Her hemoglobin is improved with iron supplementation Hemoccult  was negative in 12/18   Lab Results  Component Value Date   WBC 7.4 10/18/2017   HGB 12.2 10/18/2017   HCT 36.8 10/18/2017   MCV 91.3 10/18/2017   PLT 269.0 10/18/2017    HYPOTHYROIDISM: She has had primary hypothyroidism for over 20 years. Her TSH levels have been stable with 75 mcg levothyroxine 7.5 tablets per week, has been on this dose since 11/15   Lab Results  Component Value Date   FREET4 1.03 07/19/2017   FREET4 0.99 06/29/2016   FREET4 1.07 03/23/2016   TSH 2.82 07/19/2017   TSH 2.21 01/11/2017   TSH 1.39 06/29/2016     HYPERLIPIDEMIA: The lipid abnormality consists of elevated LDL,  taking lovastatin with good control.  Lab Results  Component Value Date   CHOL 131 04/12/2017   HDL 45.00 04/12/2017   LDLCALC 58 04/12/2017   TRIG 142.0 04/12/2017   CHOLHDL 3 04/12/2017        Examination:   BP 132/60   Pulse 70   Ht 4\' 11"  (1.499 m)   Wt 137 lb (62.1 kg)   SpO2 96%   BMI 27.67 kg/m   Body mass index is 27.67 kg/m.     ASSESSMENT/ PLAN:   Diabetes type 2 on insulin   See history of present illness for detailed discussion of current management, blood sugar patterns and problems identified  Her [name] is  slightly better and now 7.2, better than usual  Even with taking small doses of mealtime insulin her blood sugars are very labile during the day More recently also her fasting and suppertime readings are at times low normal or median in the morning only 106 Her diet is somewhat variable but recently she is trying to eat healthy and smaller portions Still has periodic high readings over 300 before or after supper  Recommendations made today:   DECREASE Tresiba by 1 unit and she will take only 13 units She will try to adjust her mealtime doses more consistently based on what she is eating  Discussed the Omnipod insulin pump.  Explained to her how this would help her diabetes control and most likely will be able to get more consistent blood  sugars She can use preset doses for meal coverage She will call to check on her insurance coverage and when ready will set her up for training  Hypertension: Her blood pressure is well-controlled with only 25 mg losartan  Hypothyroidism :her TSH is normal with regimen of 75 g, 7-1/2 tablets a week  Osteopenia: She will continue taking vitamin D monthly and will have bone density scheduled on next visit  Counseling time on subjects discussed in assessment and plan sections is over 50% of today's 25 minute visit  PNEUMOVAX booster given    Patient Instructions  Tresiba 13 units  Stop iron in March   PNEUMOVAX given    Elayne Snare 10/25/2017, 5:04 PM

## 2017-10-25 ENCOUNTER — Encounter: Payer: Self-pay | Admitting: Endocrinology

## 2017-10-25 ENCOUNTER — Ambulatory Visit (INDEPENDENT_AMBULATORY_CARE_PROVIDER_SITE_OTHER): Payer: Medicare Other | Admitting: Endocrinology

## 2017-10-25 VITALS — BP 132/60 | HR 70 | Ht 59.0 in | Wt 137.0 lb

## 2017-10-25 DIAGNOSIS — E1165 Type 2 diabetes mellitus with hyperglycemia: Secondary | ICD-10-CM

## 2017-10-25 DIAGNOSIS — Z23 Encounter for immunization: Secondary | ICD-10-CM | POA: Diagnosis not present

## 2017-10-25 DIAGNOSIS — E063 Autoimmune thyroiditis: Secondary | ICD-10-CM

## 2017-10-25 DIAGNOSIS — E559 Vitamin D deficiency, unspecified: Secondary | ICD-10-CM

## 2017-10-25 DIAGNOSIS — Z794 Long term (current) use of insulin: Secondary | ICD-10-CM

## 2017-10-25 DIAGNOSIS — D508 Other iron deficiency anemias: Secondary | ICD-10-CM

## 2017-10-25 LAB — POCT GLYCOSYLATED HEMOGLOBIN (HGB A1C): Hemoglobin A1C: 7.2

## 2017-10-25 NOTE — Patient Instructions (Addendum)
Tresiba 13 units  Stop iron in March

## 2017-11-08 ENCOUNTER — Other Ambulatory Visit: Payer: Self-pay | Admitting: Endocrinology

## 2017-12-06 ENCOUNTER — Other Ambulatory Visit: Payer: Self-pay | Admitting: Endocrinology

## 2017-12-06 DIAGNOSIS — Z1231 Encounter for screening mammogram for malignant neoplasm of breast: Secondary | ICD-10-CM

## 2017-12-27 DIAGNOSIS — N760 Acute vaginitis: Secondary | ICD-10-CM | POA: Diagnosis not present

## 2017-12-27 DIAGNOSIS — N390 Urinary tract infection, site not specified: Secondary | ICD-10-CM | POA: Diagnosis not present

## 2017-12-27 DIAGNOSIS — R8290 Unspecified abnormal findings in urine: Secondary | ICD-10-CM | POA: Diagnosis not present

## 2018-01-08 ENCOUNTER — Other Ambulatory Visit: Payer: Self-pay | Admitting: Endocrinology

## 2018-01-10 ENCOUNTER — Ambulatory Visit: Payer: PPO | Admitting: Endocrinology

## 2018-01-10 ENCOUNTER — Encounter: Payer: Self-pay | Admitting: Endocrinology

## 2018-01-10 ENCOUNTER — Other Ambulatory Visit (INDEPENDENT_AMBULATORY_CARE_PROVIDER_SITE_OTHER): Payer: PPO

## 2018-01-10 VITALS — BP 132/70 | HR 79 | Wt 133.0 lb

## 2018-01-10 DIAGNOSIS — R1012 Left upper quadrant pain: Secondary | ICD-10-CM

## 2018-01-10 DIAGNOSIS — Z794 Long term (current) use of insulin: Secondary | ICD-10-CM

## 2018-01-10 DIAGNOSIS — E559 Vitamin D deficiency, unspecified: Secondary | ICD-10-CM | POA: Diagnosis not present

## 2018-01-10 DIAGNOSIS — E1165 Type 2 diabetes mellitus with hyperglycemia: Secondary | ICD-10-CM

## 2018-01-10 DIAGNOSIS — R0789 Other chest pain: Secondary | ICD-10-CM | POA: Diagnosis not present

## 2018-01-10 LAB — COMPREHENSIVE METABOLIC PANEL
ALT: 12 U/L (ref 0–35)
AST: 17 U/L (ref 0–37)
Albumin: 3.9 g/dL (ref 3.5–5.2)
Alkaline Phosphatase: 73 U/L (ref 39–117)
BUN: 17 mg/dL (ref 6–23)
CO2: 27 mEq/L (ref 19–32)
Calcium: 9.4 mg/dL (ref 8.4–10.5)
Chloride: 102 mEq/L (ref 96–112)
Creatinine, Ser: 0.86 mg/dL (ref 0.40–1.20)
GFR: 67.38 mL/min (ref >60.00)
Glucose, Bld: 318 mg/dL — ABNORMAL HIGH (ref 70–99)
Potassium: 4 mEq/L (ref 3.5–5.1)
Sodium: 135 mEq/L (ref 135–145)
Total Bilirubin: 0.4 mg/dL (ref 0.2–1.2)
Total Protein: 6.8 g/dL (ref 6.0–8.3)

## 2018-01-10 LAB — LIPID PANEL
Cholesterol: 138 mg/dL (ref 0–200)
HDL: 49.6 mg/dL (ref >39.00)
LDL Cholesterol: 67 mg/dL (ref 0–99)
NonHDL: 88.3
Total CHOL/HDL Ratio: 3
Triglycerides: 107 mg/dL (ref 0.0–149.0)
VLDL: 21.4 mg/dL (ref 0.0–40.0)

## 2018-01-10 LAB — CBC WITH DIFFERENTIAL/PLATELET
Basophils Absolute: 0.1 10*3/uL (ref 0.0–0.1)
Basophils Relative: 0.8 % (ref 0.0–3.0)
Eosinophils Absolute: 0.2 10*3/uL (ref 0.0–0.7)
Eosinophils Relative: 1.9 % (ref 0.0–5.0)
HCT: 36.4 % (ref 36.0–46.0)
Hemoglobin: 12.3 g/dL (ref 12.0–15.0)
Lymphocytes Relative: 20.8 % (ref 12.0–46.0)
Lymphs Abs: 1.7 10*3/uL (ref 0.7–4.0)
MCHC: 33.8 g/dL (ref 30.0–36.0)
MCV: 89.7 fl (ref 78.0–100.0)
Monocytes Absolute: 0.5 10*3/uL (ref 0.1–1.0)
Monocytes Relative: 6.5 % (ref 3.0–12.0)
Neutro Abs: 5.8 10*3/uL (ref 1.4–7.7)
Neutrophils Relative %: 70 % (ref 43.0–77.0)
Platelets: 278 10*3/uL (ref 150.0–400.0)
RBC: 4.06 Mil/uL (ref 3.87–5.11)
RDW: 13.7 % (ref 11.5–15.5)
WBC: 8.3 10*3/uL (ref 4.0–10.5)

## 2018-01-10 LAB — MICROALBUMIN / CREATININE URINE RATIO
Creatinine,U: 14.6 mg/dL
Microalb Creat Ratio: 4.8 mg/g (ref 0.0–30.0)
Microalb, Ur: <0.7 mg/dL (ref 0.0–1.9)

## 2018-01-10 LAB — HEMOGLOBIN A1C: Hgb A1c MFr Bld: 8.2 % — ABNORMAL HIGH (ref 4.6–6.5)

## 2018-01-10 LAB — VITAMIN D 25 HYDROXY (VIT D DEFICIENCY, FRACTURES): VITD: 57.56 ng/mL (ref 30.00–100.00)

## 2018-01-10 NOTE — Progress Notes (Signed)
Please call to let patient know that the blood count results are normal and she needs to make appointment with her gastroenterologist

## 2018-01-10 NOTE — Progress Notes (Signed)
Patient ID: Jennifer Bailey, female   DOB: 1935-09-18, 83 y.o.   MRN: [id number]   Chief complaint: Abdominal/chest pressure other problems   History of Present Illness    Problem 1:    Upper abdominal/lower chest pressure:  She was not scheduled to come in but was seen urgently because of symptoms of abdominal/lower chest pressure sensation through the night last night She has difficulty describing her symptoms and does not think it was pain.  She thinks it was mostly pressure across the upper abdomen/lower chest although somewhat more in the abdomen and not radiating.  She took some Tylenol initially and sleep but kept waking up with symptoms She did not have any accompanying nausea, diarrhea or shortness of breath or palpitations She normally has occasional constipation but no recent change in bowel habits    Type 2 DIABETES MELITUS, date of diagnosis: 2003  She has been on insulin for several years but continues to have labile blood sugars despite using basal bolus regimen  She is very compliant with her insulin at mealtimes  Is taking small amounts of insulin and is not on any oral hypoglycemics   RECENT history:   Insulin regimen: Tresiba 14 units;   mealtime coverage breakfast:  2-3 units breakfast , 4  lunch and 2-3 at supper    She was changed from Levemir to [place] insulin in 1/17 because of relatively inconsistent control   [name] is not available but has been gradually decreasing over the last 3 measurements   Current blood sugar trends, management and problems identified:   She continues to have extreme variability in blood sugars at all times  She was told to cut back on her [place] Aas down to 13 but she has gone back on 14 units for some time when she had a couple of high readings  However FASTING readings except for today have been relatively low including a reading of 58  She checks her blood sugars before each meal and at bedtime  Again even  though her average blood sugar is fairly good overall she has a standard deviation of 69  Blood sugars are well over 200 sometimes at lunch and suppertime  May have a tendency to low sugars after dinner sporadically, however lowest blood sugar of 45 probably was from taking extra insulin for a high reading in the afternoon  She was recommended both the FreeStyle libre sensor and the Omnipod but these apparently are too expensive for her and does not want to pursue these Her weight appears to be coming down but she thinks she is cutting back on portions Next base Will take insulin with her when she is eating out .          Monitors blood glucose:  up to 4 times a day.    Glucometer: One Touch.          Blood Glucose readings from meter download:   Mean values apply above for all meters except median for One Touch  PRE-MEAL Fasting Lunch Dinner Bedtime Overall  Glucose range:  58-247      Mean/median:  131  121   139 +/-69   POST-MEAL PC Breakfast PC Lunch PC Dinner  Glucose range:  110-296  88-297   Mean/median:  185  221  115   Previous readings:  Mean values apply above for all meters except median for One Touch  PRE-MEAL Fasting Lunch Dinner Bedtime Overall  Glucose range: 57-277  100-329  50-313  45-312    Mean/median: 106 168  136  137  131     Symptoms with hypoglycemia: feels weak and shaky Low sugars are treated with Soft drink or glucose tablets      Meals: 3 meals per day.  Usually has small portions. Eggs or waffle with protein at 7 am; has mid morning snack. Occ hs snack   Evening meal usually balanced at 5 pm with 1-2 carbohydrates  Physical activity: exercise: walking 2/7 and some indoor exercises when she cannot walk           Dietician visit: Most recent: 2004            Wt Readings from Last 3 Encounters:  01/10/18 133 lb (60.3 kg)  10/25/17 137 lb (62.1 kg)  07/26/17 137 lb 3.2 oz (93.8 kg)   Complications: are: Mild neuropathy controlled  symptomatically  Lab Results  Component Value Date   HGBA1C 7.2 10/25/2017   HGBA1C 7.8 (H) 07/19/2017   HGBA1C 8.3 (H) 04/12/2017   Lab Results  Component Value Date   MICROALBUR 0.7 04/12/2017   LDLCALC 58 04/12/2017   CREATININE 0.74 10/18/2017     HYPERTENSION:  this has been mild and treated with a half of the 50 mg losartan.  She is checking her blood pressure at home occasionally    OSTEOPENIA:  She has had osteopenia with T score -2.3 at the hip in 3/09 and was started on Fosamax at that time.   This was stopped in 5/15.   No history of fracture  follow-up bone density in 2016: DualFemur Neck Right 10/03/2015 T score -1.9  Has been taking 50,000 units vitamin D every 4 weeks for vitamin D. Deficiency  Lab Results  Component Value Date   VD25OH 50.84 06/25/2016   VD25OH 58.87 07/08/2015    OTHER active problems: See review of systems   LABS:   Lab Results  Component Value Date   OCCULTBLD Negative 09/08/2017   OCCULTBLD Negative 04/22/2016   OCCULTBLD Positive (A) 02/12/2014    No visits with results within 1 Week(s) from this visit.  Latest known visit with results is:  Office Visit on 10/25/2017  Component Date Value Ref Range Status  . Hemoglobin [name] 10/25/2017 7.2   Final    Allergies as of 01/10/2018      Reactions   Macrodantin [nitrofurantoin Macrocrystal]    Penicillins       Medication List        Accurate as of 01/10/18 11:15 AM. Always use your most recent med list.          Alcohol Pads 70 % Pads USE 4 PER DAY   aspirin 81 MG tablet Take 81 mg by mouth daily.   fish oil-omega-3 fatty acids 1000 MG capsule Take 2 g by mouth daily. Two a day   FLUARIX QUADRIVALENT 0.5 ML injection Generic drug:  influenza vac split quadrivalent PF   FREESTYLE LIBRE READER Devi 1 Device by Does not apply route as directed.   [place] Misc Apply to upper arm and change sensor every 10 days   gabapentin 100  MG capsule Commonly known as:  NEURONTIN TAKE 3 TO 4 CAPSULES BY MOUTH AT BEDTIME.   Insulin Pen Needle 32G X 4 MM Misc Commonly known as:  BD PEN NEEDLE NANO U/F USE AS DIRECTED FIVE TIMES A DAY.   BD PEN NEEDLE NANO U/F 32G X 4 MM Misc Generic drug:  Insulin Pen Needle  USE AS DIRECTED FIVE TIMES A DAY.   levothyroxine 75 MCG tablet Commonly known as:  SYNTHROID, LEVOTHROID TAKE 1 TABLET BY MOUTH DAILY BEFORE BREAKFAST, EXCEPT TAKE 1 AND 1/2 TABLETS ON SUNDAY.   losartan 50 MG tablet Commonly known as:  COZAAR TAKE 1 TABLET BY MOUTH DAILY.   lovastatin 40 MG tablet Commonly known as:  MEVACOR TAKE 1 TABLET BY MOUTH ONCE DAILY WITH A MEAL.   mirtazapine 15 MG tablet Commonly known as:  REMERON TAKE 1/2 TABLET (7.5 MG TOTAL) BY MOUTH AT BEDTIME.   NOVOLOG FLEXPEN 100 UNIT/ML FlexPen Generic drug:  insulin aspart INJECT 3 TO 5 UNITS SUBCUTANEOUSLY BEFORE MEALS AS DIRECTED.   omeprazole 20 MG capsule Commonly known as:  PRILOSEC TAKE 1 CAPSULE BY MOUTH ONCE DAILY.   ONETOUCH VERIO test strip Generic drug:  glucose blood USE TO TEST BLOOD SUGAR 4 TIMES DAILY   PRESERVISION AREDS 2 Caps Take by mouth.   TRESIBA FLEXTOUCH 100 UNIT/ML Sopn FlexTouch Pen Generic drug:  insulin degludec INJECT 15 UNITS INTO THE SKIN DAILY.   vitamin C 100 MG tablet Take 100 mg by mouth daily.   Vitamin D (Ergocalciferol) 50000 units Caps capsule Commonly known as:  DRISDOL TAKE 1 CAPSULE BY MOUTH ONCE A MONTH.       Allergies:  Allergies  Allergen Reactions  . Macrodantin [Nitrofurantoin Macrocrystal]   . Penicillins     Past Medical History:  Diagnosis Date  . Diabetes mellitus without complication [facility])     Past Surgical History:  Procedure Laterality Date  . ABDOMINAL HYSTERECTOMY      Family History  Problem Relation Age of Onset  . Cancer Mother   . Diabetes Brother     Social History:  reports that she has never smoked. She has never used smokeless tobacco.  Her alcohol and drug histories are not on file. :  Review of Systems    Last colonoscopy showed only hemorrhoids in 7/15  ANEMIA: Her hemoglobin is improved with iron supplementation Hemoccult was negative in 12/18   Lab Results  Component Value Date   WBC 7.4 10/18/2017   HGB 12.2 10/18/2017   HCT 36.8 10/18/2017   MCV 91.3 10/18/2017   PLT 269.0 10/18/2017    HYPOTHYROIDISM: She has had primary hypothyroidism, long-standing . Her TSH levels have been stable with 75 mcg levothyroxine 7.5 tablets per week, has been on this dose since 11/15   Lab Results  Component Value Date   FREET4 1.03 07/19/2017   FREET4 0.99 06/29/2016   FREET4 1.07 03/23/2016   TSH 2.82 07/19/2017   TSH 2.21 01/11/2017   TSH 1.39 06/29/2016     HYPERLIPIDEMIA: The lipid abnormality consists of elevated LDL,  taking lovastatin with usually good control.  Lab Results  Component Value Date   CHOL 131 04/12/2017   HDL 45.00 04/12/2017   LDLCALC 58 04/12/2017   TRIG 142.0 04/12/2017   CHOLHDL 3 04/12/2017        Examination:   BP 132/70 (BP Location: Left Arm, Patient Position: Sitting, Cuff Size: Normal)   Pulse 79   Wt 133 lb (60.3 kg)   SpO2 97%   BMI 26.86 kg/m   Body mass index is 26.86 kg/m.    Heart sounds normal, no abnormal heart sounds Lungs clear She has mild indistinct tenderness in the epigastrium and left upper quadrant She appears to have a smooth slightly firm swelling in the left upper quadrant which is mildly tender and appears to  be splenic flexure of the colon No other tenderness, bowel sounds normal  ASSESSMENT/ PLAN:    Abdominal fullness, pressure and some lower chest fullness  Her symptoms were present during the night but this appears to be resolving spontaneously Does not appear to have any symptoms of bowel obstruction, pancreatitis or clearly any cardiac symptoms associated. Her exam shows significant fullness of the palpable left splenic flexure of  the colon but tenderness is mild  EKG is normal For now will check her CBC and if normal will refer her to her gastroenterologist for further evaluation Her last Hemoccult was negative in December  Diabetes type 2 on insulin   See history of present illness for detailed discussion of current management, blood sugar patterns and problems identified  She has on an average fairly good blood sugars at home but has significant variability at all different times including fasting This is difficult to explain with changes in diet or insulin adjustment However considering her age and long duration of diabetes her control is usually good with [name] around 7% For now since fasting readings are trending lower she will reduce her [place] Aas back to 13 units again No change in mealtime insulin for now  Again recommended that she consider the insulin pump but currently she feels this is too expensive  Hypertension: Her blood pressure is well-controlled with only 25 mg losartan  Hypothyroidism :her TSH will be checked periodically   Osteopenia: She will continue taking vitamin D monthly and will have the level checked today  Total visit time for evaluation and management of multiple problems and counseling =25 minutes     There are no Patient Instructions on file for this visit.      Elayne Snare 01/10/2018, 11:15 AM

## 2018-01-17 ENCOUNTER — Ambulatory Visit: Payer: PPO | Admitting: Endocrinology

## 2018-01-17 ENCOUNTER — Ambulatory Visit: Payer: Medicare Other

## 2018-01-17 ENCOUNTER — Ambulatory Visit
Admission: RE | Admit: 2018-01-17 | Discharge: 2018-01-17 | Disposition: A | Discharge status: Home / Self Care | Payer: PPO | Source: Ambulatory Visit | Attending: Endocrinology | Admitting: Endocrinology

## 2018-01-17 DIAGNOSIS — Z1231 Encounter for screening mammogram for malignant neoplasm of breast: Secondary | ICD-10-CM | POA: Diagnosis not present

## 2018-01-24 DIAGNOSIS — Z794 Long term (current) use of insulin: Secondary | ICD-10-CM | POA: Diagnosis not present

## 2018-01-24 DIAGNOSIS — H11153 Pinguecula, bilateral: Secondary | ICD-10-CM | POA: Diagnosis not present

## 2018-01-24 DIAGNOSIS — Z961 Presence of intraocular lens: Secondary | ICD-10-CM | POA: Diagnosis not present

## 2018-01-24 DIAGNOSIS — E119 Type 2 diabetes mellitus without complications: Secondary | ICD-10-CM | POA: Diagnosis not present

## 2018-01-24 DIAGNOSIS — H26492 Other secondary cataract, left eye: Secondary | ICD-10-CM | POA: Diagnosis not present

## 2018-01-24 DIAGNOSIS — H04123 Dry eye syndrome of bilateral lacrimal glands: Secondary | ICD-10-CM | POA: Diagnosis not present

## 2018-01-24 DIAGNOSIS — Z9841 Cataract extraction status, right eye: Secondary | ICD-10-CM | POA: Diagnosis not present

## 2018-01-24 DIAGNOSIS — H3589 Other specified retinal disorders: Secondary | ICD-10-CM | POA: Diagnosis not present

## 2018-01-24 DIAGNOSIS — H353132 Nonexudative age-related macular degeneration, bilateral, intermediate dry stage: Secondary | ICD-10-CM | POA: Diagnosis not present

## 2018-01-24 DIAGNOSIS — H11423 Conjunctival edema, bilateral: Secondary | ICD-10-CM | POA: Diagnosis not present

## 2018-01-24 DIAGNOSIS — H18413 Arcus senilis, bilateral: Secondary | ICD-10-CM | POA: Diagnosis not present

## 2018-01-24 DIAGNOSIS — H40023 Open angle with borderline findings, high risk, bilateral: Secondary | ICD-10-CM | POA: Diagnosis not present

## 2018-03-08 ENCOUNTER — Other Ambulatory Visit: Payer: Self-pay | Admitting: Endocrinology

## 2018-03-28 ENCOUNTER — Other Ambulatory Visit: Payer: Self-pay | Admitting: Endocrinology

## 2018-04-04 ENCOUNTER — Other Ambulatory Visit (INDEPENDENT_AMBULATORY_CARE_PROVIDER_SITE_OTHER): Payer: PPO

## 2018-04-04 ENCOUNTER — Other Ambulatory Visit: Payer: Self-pay | Admitting: Endocrinology

## 2018-04-04 DIAGNOSIS — Z794 Long term (current) use of insulin: Secondary | ICD-10-CM | POA: Diagnosis not present

## 2018-04-04 DIAGNOSIS — E1165 Type 2 diabetes mellitus with hyperglycemia: Secondary | ICD-10-CM | POA: Diagnosis not present

## 2018-04-04 DIAGNOSIS — E063 Autoimmune thyroiditis: Secondary | ICD-10-CM

## 2018-04-04 LAB — COMPREHENSIVE METABOLIC PANEL
ALT: 10 U/L (ref 0–35)
AST: 18 U/L (ref 0–37)
Albumin: 3.8 g/dL (ref 3.5–5.2)
Alkaline Phosphatase: 68 U/L (ref 39–117)
BUN: 14 mg/dL (ref 6–23)
CO2: 28 mEq/L (ref 19–32)
Calcium: 9.2 mg/dL (ref 8.4–10.5)
Chloride: 104 mEq/L (ref 96–112)
Creatinine, Ser: 0.81 mg/dL (ref 0.40–1.20)
GFR: 72.16 mL/min (ref >60.00)
Glucose, Bld: 156 mg/dL — ABNORMAL HIGH (ref 70–99)
Potassium: 4.2 mEq/L (ref 3.5–5.1)
Sodium: 138 mEq/L (ref 135–145)
Total Bilirubin: 0.4 mg/dL (ref 0.2–1.2)
Total Protein: 6.5 g/dL (ref 6.0–8.3)

## 2018-04-04 LAB — HEMOGLOBIN A1C: Hgb A1c MFr Bld: 8.1 % — ABNORMAL HIGH (ref 4.6–6.5)

## 2018-04-04 LAB — TSH: TSH: 2.72 u[IU]/mL (ref 0.35–4.50)

## 2018-04-11 ENCOUNTER — Encounter: Payer: Self-pay | Admitting: Endocrinology

## 2018-04-11 ENCOUNTER — Ambulatory Visit: Payer: PPO | Admitting: Endocrinology

## 2018-04-11 VITALS — BP 138/72 | HR 69 | Ht 59.0 in | Wt 128.8 lb

## 2018-04-11 DIAGNOSIS — Z794 Long term (current) use of insulin: Secondary | ICD-10-CM | POA: Diagnosis not present

## 2018-04-11 DIAGNOSIS — E1165 Type 2 diabetes mellitus with hyperglycemia: Secondary | ICD-10-CM

## 2018-04-11 DIAGNOSIS — E063 Autoimmune thyroiditis: Secondary | ICD-10-CM | POA: Diagnosis not present

## 2018-04-11 DIAGNOSIS — D638 Anemia in other chronic diseases classified elsewhere: Secondary | ICD-10-CM | POA: Diagnosis not present

## 2018-04-11 DIAGNOSIS — E7800 Pure hypercholesterolemia, unspecified: Secondary | ICD-10-CM

## 2018-04-11 DIAGNOSIS — E559 Vitamin D deficiency, unspecified: Secondary | ICD-10-CM

## 2018-04-11 MED ORDER — LORAZEPAM 0.5 MG PO TABS: 0.5000 mg | ORAL_TABLET | Freq: Two times a day (BID) | ORAL | 1 refills | Status: DC | PRN

## 2018-04-11 NOTE — Progress Notes (Signed)
Patient ID: Jennifer Bailey, female   DOB: [birth year], 83 y.o.   MRN: [id number]   Chief complaint:    History of Present Illness     Type 2 DIABETES MELITUS, date of diagnosis: 2003  She has been on insulin for several years but continues to have labile blood sugars despite using basal bolus regimen  She is very compliant with her insulin at mealtimes  Is taking small amounts of insulin and is not on any oral hypoglycemics She was changed from Levemir to [place] insulin in 1/17 because of relatively inconsistent control   RECENT history:   Insulin regimen: Tresiba 13 units;   mealtime coverage breakfast:  2-3 units breakfast , 4  lunch and 2-3 at supper    [name] is not improved and is now 8.1 compared to 8.2 previously Has been as low as 7.2  Current blood sugar trends from monitor download, management and problems identified:   She continues to have unexpected variability in blood sugars at all times  She was told to cut back on her Tyler Aas down to 13 units  With this although her blood sugars are still averaging about 137 they have been as high as 308 which she cannot explain  However no overnight hypoglycemia  She tends to have periodic hypoglycemia midday and early afternoon with or without exercise, occasionally may be low after supper also  This despite taking only small amounts of insulin, rarely more than 4 units at a time  She is always injecting her insulin before eating, sometimes a few minutes before  She may not take her insulin if she is planning to exercise like mowing the lawn  Always having some protein like eggs in the morning at breakfast  Blood sugars can be as low as 35 and she may not have significant symptoms  There is no consistent pattern of her blood sugars although they are not as fluctuating at lunchtime  Will take insulin with her to inject when she is eating out .          Monitors blood glucose:  Usually up to 4 times a  day.    Glucometer: One Touch.          Blood Glucose readings from meter download: Low but your [name] is 8.18.1 used to be like 7.2 on an average of sugars and are looking better but there is usually fluctuating so much  Mean values apply above for all meters except median for One Touch  PRE-MEAL Fasting Lunch Dinner Bedtime Overall  Glucose range:  77-308    48-275  35-308  Mean/median:  137  137  167  156  150   [name] expected from her blood sugars should be 7%  Symptoms with hypoglycemia: feels weak and shaky Low sugars are treated with Soft drink or glucose tablets      Meals: 3 meals per day.  Usually has small portions. Eggs/bread or one pancake with protein at 7 am; has mid morning snack.  Has hs snack if blood sugar not high Evening meal usually balanced at 5 pm with 1-2 carbohydrates  Physical activity: exercise: walking 2/7 and some lawnmowing         Dietician visit: Most recent: 2004            Wt Readings from Last 3 Encounters:  04/11/18 128 lb 12.8 oz (58.4 kg)  01/10/18 133 lb (60.3 kg)  10/25/17 137 lb (56.3 kg)   Complications: are: Mild  neuropathy controlled symptomatically  Lab Results  Component Value Date   HGBA1C 8.1 (H) 04/04/2018   HGBA1C 8.2 (H) 01/10/2018   HGBA1C 7.2 10/25/2017   Lab Results  Component Value Date   MICROALBUR <0.7 01/10/2018   LDLCALC 67 01/10/2018   CREATININE 0.81 04/04/2018     HYPERTENSION:  this has been mild and treated with a half of the 50 mg losartan.  She is checking her blood pressure at home occasionally  BP Readings from Last 3 Encounters:  04/11/18 138/72  01/10/18 132/70  10/25/17 132/60      OSTEOPENIA:  She has had osteopenia with T score -2.3 at the hip in 3/09 and was started on Fosamax at that time.   This was stopped in 5/15.   No history of fracture  follow-up bone density in 2016: DualFemur Neck Right 10/03/2015 T score -1.9  Has been taking 50,000 units vitamin D every 4 weeks for  vitamin D. Deficiency  Lab Results  Component Value Date   VD25OH 57.56 01/10/2018   VD25OH 50.84 06/25/2016    OTHER active problems: See review of systems   LABS:   Lab Results  Component Value Date   OCCULTBLD Negative 09/08/2017   OCCULTBLD Negative 04/22/2016   OCCULTBLD Positive (A) 02/12/2014    No visits with results within 1 Week(s) from this visit.  Latest known visit with results is:  Lab on 04/04/2018  Component Date Value Ref Range Status  . TSH 04/04/2018 2.72  0.35 - 4.50 uIU/mL Final  . Sodium 04/04/2018 138  135 - 145 mEq/L Final  . Potassium 04/04/2018 4.2  3.5 - 5.1 mEq/L Final  . Chloride 04/04/2018 104  96 - 112 mEq/L Final  . CO2 04/04/2018 28  19 - 32 mEq/L Final  . Glucose, Bld 04/04/2018 156* 70 - 99 mg/dL Final  . BUN 04/04/2018 14  6 - 23 mg/dL Final  . Creatinine, Ser 04/04/2018 0.81  0.40 - 1.20 mg/dL Final  . Total Bilirubin 04/04/2018 0.4  0.2 - 1.2 mg/dL Final  . Alkaline Phosphatase 04/04/2018 68  39 - 117 U/L Final  . AST 04/04/2018 18  0 - 37 U/L Final  . ALT 04/04/2018 10  0 - 35 U/L Final  . Total Protein 04/04/2018 6.5  6.0 - 8.3 g/dL Final  . Albumin 04/04/2018 3.8  3.5 - 5.2 g/dL Final  . Calcium 04/04/2018 9.2  8.4 - 10.5 mg/dL Final  . GFR 04/04/2018 72.16  >60.00 mL/min Final  . Hgb [name] MFr Bld 04/04/2018 8.1* 4.6 - 6.5 % Final   Glycemic Control Guidelines for People with Diabetes:Non Diabetic:  <6%Goal of Therapy: <7%Additional Action Suggested:  >8%     Allergies as of 04/11/2018      Reactions   Macrodantin [nitrofurantoin Macrocrystal]    Penicillins       Medication List        Accurate as of 04/11/18  9:24 AM. Always use your most recent med list.          Alcohol Pads 70 % Pads USE 4 PER DAY   aspirin 81 MG tablet Take 81 mg by mouth daily.   fish oil-omega-3 fatty acids 1000 MG capsule Take 2 g by mouth daily. Two a day   FLUARIX QUADRIVALENT 0.5 ML injection Generic drug:  influenza vac split  quadrivalent PF   gabapentin 100 MG capsule Commonly known as:  NEURONTIN TAKE 3 TO 4 CAPSULES BY MOUTH AT BEDTIME.   Insulin  Pen Needle 32G X 4 MM Misc Commonly known as:  BD PEN NEEDLE NANO U/F USE AS DIRECTED FIVE TIMES A DAY.   BD PEN NEEDLE NANO U/F 32G X 4 MM Misc Generic drug:  Insulin Pen Needle USE AS DIRECTED FIVE TIMES A DAY.   levothyroxine 75 MCG tablet Commonly known as:  SYNTHROID, LEVOTHROID TAKE 1 TABLET BY MOUTH DAILY BEFORE BREAKFAST, EXCEPT TAKE 1 AND 1/2 TABLETS ON SUNDAY.   losartan 50 MG tablet Commonly known as:  COZAAR TAKE 1 TABLET BY MOUTH DAILY.   lovastatin 40 MG tablet Commonly known as:  MEVACOR TAKE 1 TABLET BY MOUTH ONCE DAILY WITH A MEAL.   mirtazapine 15 MG tablet Commonly known as:  REMERON TAKE 1/2 TABLET (7.5 MG TOTAL) BY MOUTH AT BEDTIME.   NOVOLOG FLEXPEN 100 UNIT/ML FlexPen Generic drug:  insulin aspart INJECT 3 TO 5 UNITS SUBCUTANEOUSLY BEFORE MEALS AS DIRECTED.   omeprazole 20 MG capsule Commonly known as:  PRILOSEC TAKE 1 CAPSULE BY MOUTH ONCE DAILY.   ONETOUCH VERIO test strip Generic drug:  glucose blood USE TO TEST BLOOD SUGAR 4 TIMES DAILY   PRESERVISION AREDS 2 Caps Take by mouth.   TRESIBA FLEXTOUCH 100 UNIT/ML Sopn FlexTouch Pen Generic drug:  insulin degludec INJECT 15 UNITS INTO THE SKIN DAILY.   vitamin C 100 MG tablet Take 100 mg by mouth daily.   Vitamin D (Ergocalciferol) 50000 units Caps capsule Commonly known as:  DRISDOL TAKE 1 CAPSULE BY MOUTH ONCE A MONTH.       Allergies:  Allergies  Allergen Reactions  . Macrodantin [Nitrofurantoin Macrocrystal]   . Penicillins     Past Medical History:  Diagnosis Date  . Diabetes mellitus without complication [facility])     Past Surgical History:  Procedure Laterality Date  . ABDOMINAL HYSTERECTOMY      Family History  Problem Relation Age of Onset  . Cancer Mother   . Diabetes Brother     Social History:  reports that she has never smoked.  She has never used smokeless tobacco. Her alcohol and drug histories are not on file. :  Review of Systems   She is asking about taking a sedative to help her with dealing with her brother who is terminally ill, Ativan sent  Last colonoscopy showed only hemorrhoids in 7/15  ANEMIA: Her hemoglobin is consistently normal now with iron supplementation Hemoccult was negative in 12/18   Lab Results  Component Value Date   WBC 8.3 01/10/2018   HGB 12.3 01/10/2018   HCT 36.4 01/10/2018   MCV 89.7 01/10/2018   PLT 278.0 01/10/2018    HYPOTHYROIDISM: She has had primary hypothyroidism, long-standing . Her TSH levels have been stable with 75 mcg levothyroxine 7.5 tablets per week, has been on this dose since 11/15   Lab Results  Component Value Date   FREET4 1.03 07/19/2017   FREET4 0.99 06/29/2016   FREET4 1.07 03/23/2016   TSH 2.72 04/04/2018   TSH 2.82 07/19/2017   TSH 2.21 01/11/2017     HYPERLIPIDEMIA: The lipid abnormality consists of elevated LDL,  taking lovastatin with consistently good control.  Lab Results  Component Value Date   CHOL 138 01/10/2018   HDL 49.60 01/10/2018   LDLCALC 67 01/10/2018   TRIG 107.0 01/10/2018   CHOLHDL 3 01/10/2018        Examination:   BP 138/72 (BP Location: Left Arm, Patient Position: Sitting, Cuff Size: Normal)   Pulse 69   Ht 4\' 11"  (  1.499 m)   Wt 128 lb 12.8 oz (58.4 kg)   SpO2 98%   BMI 26.01 kg/m   Body mass index is 26.01 kg/m.    ASSESSMENT/ PLAN:     Diabetes type 2 on insulin   See history of present illness for detailed discussion of current management, blood sugar patterns and problems identified  Her [name] is 8.1  As before her blood sugars are fluctuating very significantly at all different times including some tendency to hypoglycemia and no consistent pattern She cannot explain her variability based on her diet or activity level usually Generally trying to be consistent with checking her blood sugars  and balancing her diet also As before she is active but this is not always causing hypoglycemia also  Recommended that she try the freestyle libre sensor for more complete information and guidance on her treating or preventing low or high blood sugars more proactively and showed her how this would be used Given supplier list for her DME sources For now will not change her basic insulin regimen May consider further education with diabetes nurse  Hypertension: Her blood pressure is well-controlled with only 25 mg losartan  Hypothyroidism :her TSH is consistently normal  Counseling time on subjects discussed in assessment and plan sections is over 50% of today's 25 minute visit      There are no Patient Instructions on file for this visit.      Elayne Snare 04/11/2018, 9:24 AM

## 2018-04-12 ENCOUNTER — Telehealth: Payer: Self-pay

## 2018-04-12 ENCOUNTER — Telehealth: Payer: Self-pay | Admitting: Emergency Medicine

## 2018-04-12 ENCOUNTER — Other Ambulatory Visit: Payer: Self-pay

## 2018-04-12 MED ORDER — FREESTYLE LIBRE 14 DAY SENSOR MISC: 1.0000 | Freq: Every day | 3 refills | Status: DC

## 2018-04-12 MED ORDER — FREESTYLE LIBRE 14 DAY READER DEVI: 1.0000 | Freq: Every day | 0 refills | Status: DC

## 2018-04-12 NOTE — Telephone Encounter (Signed)
Rx sent 

## 2018-04-12 NOTE — Telephone Encounter (Signed)
Pt called [facility] stating the doctor wanted her to get a Freestyle Libre 14 day system and she called the drug store. They have it there and wouldn't be a charge. She is asking if Dwyane Dee can call her in a prescription for one.

## 2018-04-12 NOTE — Telephone Encounter (Signed)
Pt called and left detailed voicemail letting pt know that Rx was sent.

## 2018-04-12 NOTE — Telephone Encounter (Signed)
Patient called today because she was told by Dr. Dwyane Dee that she should try the [facility] and he would order it but when she went to the pharmacy there was no prescription there- patient would like for [place] to send in a prescription for 14 day reader and sensor to [facility] and give her a call back when this has been done

## 2018-04-12 NOTE — Telephone Encounter (Signed)
Please prescribe 14-day meter and sensor.  She may have to have Vaughan Basta show her how to use it.  Also she may need to get it at [place] unless [facility] is able to provide

## 2018-04-27 ENCOUNTER — Telehealth: Payer: Self-pay | Admitting: Endocrinology

## 2018-04-27 NOTE — Telephone Encounter (Signed)
error 

## 2018-04-28 ENCOUNTER — Telehealth: Payer: Self-pay | Admitting: Endocrinology

## 2018-04-28 NOTE — Telephone Encounter (Signed)
Has she been taking any prednisone or other steroids from another doctor?  She needs to take another 3 units of NovoLog with every meal for now and also take extra 2 units of Tresiba now.  Also keep Tresiba at 15 units until morning sugar is below 150

## 2018-04-28 NOTE — Telephone Encounter (Signed)
Patient would like to know if she should be taking extra Novolog to bring her sugars down   Blood sugar readings:  Yesterday before lunch -338 After Lunch- 457 Before Dinner- 355 After eating dinner- 329 Before Bedtime- 288 This am before eating - 251 After breakfast-315  Please advise

## 2018-04-28 NOTE — Telephone Encounter (Signed)
Called pt and gave her MD message. Pt verbalized understanding. 

## 2018-04-29 ENCOUNTER — Encounter (HOSPITAL_COMMUNITY): Payer: Self-pay | Admitting: Emergency Medicine

## 2018-04-29 ENCOUNTER — Other Ambulatory Visit: Payer: Self-pay

## 2018-04-29 ENCOUNTER — Telehealth: Payer: Self-pay

## 2018-04-29 DIAGNOSIS — Z79899 Other long term (current) drug therapy: Secondary | ICD-10-CM | POA: Insufficient documentation

## 2018-04-29 DIAGNOSIS — R739 Hyperglycemia, unspecified: Secondary | ICD-10-CM | POA: Diagnosis present

## 2018-04-29 DIAGNOSIS — E1165 Type 2 diabetes mellitus with hyperglycemia: Secondary | ICD-10-CM | POA: Insufficient documentation

## 2018-04-29 DIAGNOSIS — E039 Hypothyroidism, unspecified: Secondary | ICD-10-CM | POA: Insufficient documentation

## 2018-04-29 DIAGNOSIS — I10 Essential (primary) hypertension: Secondary | ICD-10-CM | POA: Diagnosis not present

## 2018-04-29 DIAGNOSIS — Z7982 Long term (current) use of aspirin: Secondary | ICD-10-CM | POA: Diagnosis not present

## 2018-04-29 DIAGNOSIS — Z794 Long term (current) use of insulin: Secondary | ICD-10-CM | POA: Diagnosis not present

## 2018-04-29 LAB — BASIC METABOLIC PANEL
Anion gap: 9 (ref 5–15)
BUN: 23 mg/dL (ref 8–23)
CO2: 28 mmol/L (ref 22–32)
Calcium: 9.7 mg/dL (ref 8.9–10.3)
Chloride: 102 mmol/L (ref 98–111)
Creatinine, Ser: 1 mg/dL (ref 0.44–1.00)
GFR calc Af Amer: >60 mL/min (ref >60)
GFR calc non Af Amer: 52 mL/min — ABNORMAL LOW (ref >60)
Glucose, Bld: 329 mg/dL — ABNORMAL HIGH (ref 70–99)
Potassium: 3.8 mmol/L (ref 3.5–5.1)
Sodium: 139 mmol/L (ref 135–145)

## 2018-04-29 LAB — CBG MONITORING, ED: Glucose-Capillary: 338 mg/dL — ABNORMAL HIGH (ref 70–99)

## 2018-04-29 LAB — CBC
HCT: 38.4 % (ref 36.0–46.0)
Hemoglobin: 13.2 g/dL (ref 12.0–15.0)
MCH: 30.5 pg (ref 26.0–34.0)
MCHC: 34.4 g/dL (ref 30.0–36.0)
MCV: 88.7 fL (ref 78.0–100.0)
Platelets: 334 10*3/uL (ref 150–400)
RBC: 4.33 MIL/uL (ref 3.87–5.11)
RDW: 13 % (ref 11.5–15.5)
WBC: 9.4 10*3/uL (ref 4.0–10.5)

## 2018-04-29 LAB — URINALYSIS, ROUTINE W REFLEX MICROSCOPIC
Bacteria, UA: NONE SEEN
Bilirubin Urine: NEGATIVE
Glucose, UA: >=500 mg/dL — AB
Hgb urine dipstick: NEGATIVE
Ketones, ur: NEGATIVE mg/dL
Leukocytes, UA: NEGATIVE
Nitrite: NEGATIVE
Protein, ur: NEGATIVE mg/dL
Specific Gravity, Urine: 1.007 (ref 1.005–1.030)
pH: 7 (ref 5.0–8.0)

## 2018-04-29 NOTE — Telephone Encounter (Signed)
Please take another 7 units of Novolog now and recheck in 1-1.5h and call us back later today if the sugars do not improve.

## 2018-04-29 NOTE — ED Notes (Signed)
Urine culture sent down with UA. 

## 2018-04-29 NOTE — Telephone Encounter (Signed)
Patient calling back today because her blood sugar is at 500 and she is very worried- patient was given advice from the note below yesterday and she tried it but when she woke up this morning her blood sugar was 70 so she only took 13 units because she thought it was coming down. before the patient ate lunch today, blood sugar was 329 so she took 7 units of novolog and now her blood sugar is up to 500 please advise in Dr. Jodelle Green absence.

## 2018-04-29 NOTE — ED Triage Notes (Signed)
Patient is complaining of her sugars been running high since Tuesday. She states it is affecting her eyes.

## 2018-04-30 ENCOUNTER — Emergency Department (HOSPITAL_COMMUNITY)
Admission: EM | Admit: 2018-04-30 | Discharge: 2018-04-30 | Disposition: A | Discharge status: Home / Self Care | Payer: PPO | Attending: Emergency Medicine | Admitting: Emergency Medicine

## 2018-04-30 DIAGNOSIS — R739 Hyperglycemia, unspecified: Secondary | ICD-10-CM

## 2018-04-30 LAB — CBG MONITORING, ED: Glucose-Capillary: 256 mg/dL — ABNORMAL HIGH (ref 70–99)

## 2018-04-30 MED ORDER — SODIUM CHLORIDE 0.9 % IV BOLUS
500.0000 mL | Freq: Once | INTRAVENOUS | Status: AC
  Administered 2018-04-30: 500 mL via INTRAVENOUS

## 2018-04-30 NOTE — ED Notes (Signed)
A cup of water was given to pt.

## 2018-04-30 NOTE — ED Provider Notes (Addendum)
[facility] DEPT Provider Note  CSN: [id number] Arrival date & time: 04/29/18 2140  Chief Complaint(s) Hyperglycemia  HPI Jennifer Bailey is a 83 y.o. female    Hyperglycemia  Blood sugar level PTA:  Ranging from 70-535 Severity:  Moderate Onset quality:  Gradual Duration: several days. Timing:  Intermittent Progression:  Waxing and waning Chronicity:  New Diabetes status:  Controlled with insulin Current diabetic therapy:  Novolog and Tresiba Context comment:  Had recent increase of her Tresiba Relieved by:  Insulin Associated symptoms: no abdominal pain, no altered mental status, no chest pain, no confusion, no dehydration, no diaphoresis, no dizziness, no dysuria, no fatigue, no fever, no increased appetite, no increased thirst, no nausea, no polyuria, no shortness of breath, no vomiting and no weakness     Past Medical History Past Medical History:  Diagnosis Date  . Diabetes mellitus without complication [facility])    Patient Active Problem List   Diagnosis Date Noted  . Acquired autoimmune hypothyroidism 08/20/2014  . Type II diabetes mellitus, uncontrolled ([place]) 08/20/2014  . Osteoporosis, unspecified 11/20/2013  . Polyneuropathy in diabetes(357.2) 08/14/2013  . Unspecified hypothyroidism 06/14/2013  . Essential hypertension 06/14/2013  . Pure hypercholesterolemia 06/14/2013  . Type II or unspecified type diabetes mellitus without mention of complication, uncontrolled 05/29/2013   Home Medication(s) Prior to Admission medications   Medication Sig Start Date End Date Taking? Authorizing Provider  Ascorbic Acid (VITAMIN C) 100 MG tablet Take 500 mg by mouth daily.    Yes [provider]  aspirin 81 MG tablet Take 81 mg by mouth daily.   Yes [provider]  fish oil-omega-3 fatty acids 1000 MG capsule Take 2 g by mouth daily. Two a day   Yes [provider]  gabapentin (NEURONTIN) 100 MG capsule TAKE 3 TO 4  CAPSULES BY MOUTH AT BEDTIME. 11/09/17  Yes Elayne Snare, MD  levothyroxine (SYNTHROID, LEVOTHROID) 75 MCG tablet TAKE 1 TABLET BY MOUTH DAILY BEFORE BREAKFAST, EXCEPT TAKE 1 AND 1/2 TABLETS ON SUNDAY. 03/08/18  Yes Elayne Snare, MD  LORazepam (ATIVAN) 0.5 MG tablet Take 1 tablet (0.5 mg total) by mouth 2 (two) times daily as needed for anxiety. 04/11/18  Yes Elayne Snare, MD  losartan (COZAAR) 50 MG tablet TAKE 1 TABLET BY MOUTH DAILY. 09/21/17  Yes Elayne Snare, MD  lovastatin (MEVACOR) 40 MG tablet TAKE 1 TABLET BY MOUTH ONCE DAILY WITH A MEAL. 05/11/17  Yes Elayne Snare, MD  Multiple Vitamins-Minerals (PRESERVISION AREDS 2) CAPS Take by mouth.   Yes [provider]  NOVOLOG FLEXPEN 100 UNIT/ML FlexPen INJECT 3 TO 5 UNITS SUBCUTANEOUSLY BEFORE MEALS AS DIRECTED. 01/09/18  Yes Elayne Snare, MD  omeprazole (PRILOSEC) 20 MG capsule TAKE 1 CAPSULE BY MOUTH ONCE DAILY. 06/08/17  Yes Elayne Snare, MD  TRESIBA FLEXTOUCH 100 UNIT/ML SOPN FlexTouch Pen INJECT 15 UNITS INTO THE SKIN DAILY. Patient taking differently: Inject 13 Units into the skin daily.  06/02/17  Yes Elayne Snare, MD  Vitamin D, Ergocalciferol, (DRISDOL) 50000 units CAPS capsule TAKE 1 CAPSULE BY MOUTH ONCE A MONTH. 05/11/17  Yes Elayne Snare, MD  Alcohol Swabs (ALCOHOL PADS) 70 % PADS USE 4 PER DAY 11/14/14   Elayne Snare, MD  BD PEN NEEDLE NANO U/F 32G X 4 MM MISC USE AS DIRECTED FIVE TIMES A DAY. 03/03/16   Elayne Snare, MD  Continuous Blood Gluc Receiver (FREESTYLE LIBRE 14 DAY READER) DEVI 1 each by Does not apply route daily. USE DAILY TO CHECK BLOOD  SUGAR WITH FREESTYLE LIBRE SENSOR. 04/12/18   Elayne Snare, MD  Continuous Blood Gluc Sensor (FREESTYLE LIBRE 14 DAY SENSOR) MISC 1 each by Does not apply route daily. APPLY TO ARM EVERY 14 DAYS TO MONITOR BLOOD SUGAR. 04/12/18   Elayne Snare, MD  Insulin Pen Needle (BD PEN NEEDLE NANO U/F) 32G X 4 MM MISC USE AS DIRECTED FIVE TIMES A DAY. 11/14/14   Elayne Snare, MD  [facility] VERIO test strip USE TO TEST BLOOD  SUGAR 4 TIMES DAILY 03/28/18   Elayne Snare, MD                                                                                                                                    Past Surgical History Past Surgical History:  Procedure Laterality Date  . ABDOMINAL HYSTERECTOMY     Family History Family History  Problem Relation Age of Onset  . Cancer Mother   . Diabetes Brother     Social History Social History   Tobacco Use  . Smoking status: Never Smoker  . Smokeless tobacco: Never Used  Substance Use Topics  . Alcohol use: Never    Frequency: Never  . Drug use: Never   Allergies Macrodantin [nitrofurantoin macrocrystal] and Penicillins  Review of Systems Review of Systems  Constitutional: Negative for diaphoresis, fatigue and fever.  Respiratory: Negative for shortness of breath.   Cardiovascular: Negative for chest pain.  Gastrointestinal: Negative for abdominal pain, nausea and vomiting.  Endocrine: Negative for polydipsia and polyuria.  Genitourinary: Negative for dysuria.  Neurological: Negative for dizziness and weakness.  Psychiatric/Behavioral: Negative for confusion.   All other systems are reviewed and are negative for acute change except as noted in the HPI  Physical Exam Vital Signs  I have reviewed the triage vital signs BP (!) 179/67 (BP Location: Left Arm)   Pulse 83   Temp 98.4 F (36.9 C) (Oral)   Resp 18   Ht 4\' 11"  (1.499 m)   Wt 58.1 kg (128 lb)   SpO2 100%   BMI 25.85 kg/m   Physical Exam  Constitutional: She is oriented to person, place, and time. She appears well-developed and well-nourished. No distress.  HENT:  Head: Normocephalic and atraumatic.  Right Ear: External ear normal.  Left Ear: External ear normal.  Nose: Nose normal.  Eyes: Conjunctivae and EOM are normal. No scleral icterus.  Neck: Normal range of motion and phonation normal.  Cardiovascular: Normal rate and regular rhythm.  Pulmonary/Chest: Effort normal. No  stridor. No respiratory distress.  Abdominal: She exhibits no distension.  Musculoskeletal: Normal range of motion. She exhibits no edema.  Neurological: She is alert and oriented to person, place, and time.  Skin: She is not diaphoretic.  Psychiatric: She has a normal mood and affect. Her behavior is normal.  Vitals reviewed.   ED Results and Treatments Labs (all labs ordered are listed, but only abnormal results are displayed)  Labs Reviewed  BASIC METABOLIC PANEL - Abnormal; Notable for the following components:      Result Value   Glucose, Bld 329 (*)    GFR calc non Af Amer 52 (*)    All other components within normal limits  URINALYSIS, ROUTINE W REFLEX MICROSCOPIC - Abnormal; Notable for the following components:   Color, Urine STRAW (*)    Glucose, UA >=500 (*)    All other components within normal limits  CBG MONITORING, ED - Abnormal; Notable for the following components:   Glucose-Capillary 338 (*)    All other components within normal limits  CBG MONITORING, ED - Abnormal; Notable for the following components:   Glucose-Capillary 256 (*)    All other components within normal limits  CBC  CBG MONITORING, ED                                                                                                                         EKG  EKG Interpretation  Date/Time:    Ventricular Rate:    PR Interval:    QRS Duration:   QT Interval:    QTC Calculation:   R Axis:     Text Interpretation:        Radiology No results found. Pertinent labs & imaging results that were available during my care of the patient were reviewed by me and considered in my medical decision making (see chart for details).  Medications Ordered in ED Medications  sodium chloride 0.9 % bolus 500 mL (500 mLs Intravenous New Bag/Given 04/30/18 0151)                                                                                                                                     Procedures Procedures  (including critical care time)  Medical Decision Making / ED Course I have reviewed the nursing notes for this encounter and the patient's prior records (if available in EHR or on provided paperwork).    Hyperglycemia without evidence of DKA or HHS.  UA without evidence of infection.  Rest of the labs grossly reassuring without leukocytosis or anemia.  No significant electrolyte derangements.  Patient denied any infectious symptoms or steroid use.  Provided with small bolus of IV fluids.  The patient appears reasonably screened and/or stabilized for discharge and I doubt any other medical condition or other [facility] requiring further screening, evaluation, or treatment  in the ED at this time prior to discharge.  The patient is safe for discharge with strict return precautions.   Final Clinical Impression(s) / ED Diagnoses Final diagnoses:  Hyperglycemia    Disposition: Discharge  Condition: Good  I have discussed the results, Dx and Tx plan with the patient and daughter who expressed understanding and agree(s) with the plan. Discharge instructions discussed at great length. The patient and daughter was given strict return precautions who verbalized understanding of the instructions. No further questions at time of discharge.    ED Discharge Orders    None       Follow Up: Primary care provider  Schedule an appointment as soon as possible for a visit  For assistance with antidiabetic medication management.     This chart was dictated using voice recognition software.  Despite best efforts to proofread,  errors can occur which can change the documentation meaning.   Fatima Blank, MD 04/30/18 0623    Fatima Blank, MD 04/30/18 [phone]

## 2018-05-02 ENCOUNTER — Other Ambulatory Visit: Payer: Self-pay | Admitting: Endocrinology

## 2018-05-02 NOTE — Telephone Encounter (Signed)
LMTCB

## 2018-05-28 ENCOUNTER — Other Ambulatory Visit: Payer: Self-pay | Admitting: Endocrinology

## 2018-06-25 ENCOUNTER — Other Ambulatory Visit: Payer: Self-pay | Admitting: Endocrinology

## 2018-07-11 ENCOUNTER — Other Ambulatory Visit (INDEPENDENT_AMBULATORY_CARE_PROVIDER_SITE_OTHER): Payer: PPO

## 2018-07-11 DIAGNOSIS — E1165 Type 2 diabetes mellitus with hyperglycemia: Secondary | ICD-10-CM | POA: Diagnosis not present

## 2018-07-11 DIAGNOSIS — E063 Autoimmune thyroiditis: Secondary | ICD-10-CM

## 2018-07-11 DIAGNOSIS — Z794 Long term (current) use of insulin: Secondary | ICD-10-CM

## 2018-07-11 DIAGNOSIS — E7800 Pure hypercholesterolemia, unspecified: Secondary | ICD-10-CM | POA: Diagnosis not present

## 2018-07-11 DIAGNOSIS — E559 Vitamin D deficiency, unspecified: Secondary | ICD-10-CM

## 2018-07-11 LAB — IBC PANEL
Iron: 72 ug/dL (ref 42–145)
Saturation Ratios: 26.4 % (ref 20.0–50.0)
Transferrin: 195 mg/dL — ABNORMAL LOW (ref 212.0–360.0)

## 2018-07-11 LAB — LIPID PANEL
Cholesterol: 131 mg/dL (ref 0–200)
HDL: 50.2 mg/dL (ref >39.00)
LDL Cholesterol: 62 mg/dL (ref 0–99)
NonHDL: 80.63
Total CHOL/HDL Ratio: 3
Triglycerides: 93 mg/dL (ref 0.0–149.0)
VLDL: 18.6 mg/dL (ref 0.0–40.0)

## 2018-07-11 LAB — COMPREHENSIVE METABOLIC PANEL
ALT: 9 U/L (ref 0–35)
AST: 17 U/L (ref 0–37)
Albumin: 3.9 g/dL (ref 3.5–5.2)
Alkaline Phosphatase: 68 U/L (ref 39–117)
BUN: 15 mg/dL (ref 6–23)
CO2: 29 mEq/L (ref 19–32)
Calcium: 9.3 mg/dL (ref 8.4–10.5)
Chloride: 106 mEq/L (ref 96–112)
Creatinine, Ser: 0.82 mg/dL (ref 0.40–1.20)
GFR: 71.1 mL/min (ref >60.00)
Glucose, Bld: 69 mg/dL — ABNORMAL LOW (ref 70–99)
Potassium: 3.9 mEq/L (ref 3.5–5.1)
Sodium: 140 mEq/L (ref 135–145)
Total Bilirubin: 0.5 mg/dL (ref 0.2–1.2)
Total Protein: 6.8 g/dL (ref 6.0–8.3)

## 2018-07-11 LAB — MICROALBUMIN / CREATININE URINE RATIO
Creatinine,U: 22.9 mg/dL
Microalb Creat Ratio: 3.1 mg/g (ref 0.0–30.0)
Microalb, Ur: <0.7 mg/dL (ref 0.0–1.9)

## 2018-07-11 LAB — CBC
HCT: 35.2 % — ABNORMAL LOW (ref 36.0–46.0)
Hemoglobin: 11.9 g/dL — ABNORMAL LOW (ref 12.0–15.0)
MCHC: 33.7 g/dL (ref 30.0–36.0)
MCV: 90.3 fl (ref 78.0–100.0)
Platelets: 271 10*3/uL (ref 150.0–400.0)
RBC: 3.9 Mil/uL (ref 3.87–5.11)
RDW: 13.8 % (ref 11.5–15.5)
WBC: 8.3 10*3/uL (ref 4.0–10.5)

## 2018-07-11 LAB — URINALYSIS, ROUTINE W REFLEX MICROSCOPIC
Bilirubin Urine: NEGATIVE
Hgb urine dipstick: NEGATIVE
Ketones, ur: NEGATIVE
Leukocytes, UA: NEGATIVE
Nitrite: NEGATIVE
RBC / HPF: NONE SEEN (ref 0–?)
Specific Gravity, Urine: <=1.005 — AB (ref 1.000–1.030)
Total Protein, Urine: NEGATIVE
Urine Glucose: NEGATIVE
Urobilinogen, UA: 0.2 (ref 0.0–1.0)
pH: 6.5 (ref 5.0–8.0)

## 2018-07-11 LAB — HEMOGLOBIN A1C: Hgb A1c MFr Bld: 7.2 % — ABNORMAL HIGH (ref 4.6–6.5)

## 2018-07-11 LAB — TSH: TSH: 3.19 u[IU]/mL (ref 0.35–4.50)

## 2018-07-11 LAB — T4, FREE: Free T4: 1.01 ng/dL (ref 0.60–1.60)

## 2018-07-11 LAB — VITAMIN D 25 HYDROXY (VIT D DEFICIENCY, FRACTURES): VITD: 64.1 ng/mL (ref 30.00–100.00)

## 2018-07-17 NOTE — Progress Notes (Signed)
Patient ID: Jennifer Bailey, female   DOB: [birth year], 83 y.o.   MRN: [id number]   Reason for Appointment:  Complete physical exam and follow-up of medical problems   History of Present Illness    Problem 1:     Type 2 DIABETES MELITUS, date of diagnosis: 2003  She has been on insulin for several years but continues to have labile blood sugars despite using basal bolus regimen She is very compliant with her insulin at mealtimes and is consistent with the timing of her Levemir insulin twice a day Is taking small amounts of insulin and is not on any oral hypoglycemics, appears to be completely insulin-dependent Her diet is somewhat variable but she is usually having balanced meals and not eating out excessively      RECENT history:   Insulin regimen: Tresiba 13 units;   mealtime coverage with NovoLog breakfast:  2-3 units breakfast , 4  lunch and 2-3 at supper   She was changed from Levemir to [place] insulin in 1/17 because of relatively inconsistent control and [name] persistently over 8%.  Current blood sugar trends, management and problems identified:   She is now taking her blood sugars with the freestyle libre since her last visit  Review of her fingerstick readings compared to the freestyle libre appeared to show fairly good concordance  Surprisingly her blood sugar variability has been much less compared to previous times when she was using fingersticks and was having a standard deviation of up to 90  Most of her difficulties with control of have been with postprandial blood sugars which are inconsistent usually  However as discussed in the interpretation below this is less noticeable except after breakfast  Hypoglycemia also has been occurring sporadically and usually before breakfast and sometimes before dinnertime with symptoms  She is trying to do some physical activities consistently but appears to have gained some weight   She is trying to adjust her  mealtime dose based on her meal size and blood sugar level and will take 1 extra unit Novolog for blood sugars over 200          Proper timing of medications in relation to meals: Yes, will take NovoLog after eating a blood sugar low before meal.  Will take insulin with her when she is eating out .          Monitors blood glucose:  Several times a day.    Glucometer:  [employer] and One Touch.            Blood Glucose analysis from download:    CONTINUOUS GLUCOSE MONITORING RECORD INTERPRETATION    Dates of Recording: 10/1 fluid 07/18/2018  Sensor description: [employer]  Results statistics:   CGM use % of time  95  Average and SD  139+544  Time in range     79   %  % Time Above 180  17  % Time above 250  2  % Time Below target  for    Glycemic patterns summary: Blood sugars are fluctuating about the same degree throughout the day but more after waking up and after supper Hypoglycemia is most often around 4 AM and occasionally around 6 PM but not consistent and she does not have any significant consistent pattern of hypoglycemia  Hyperglycemic episodes occurred periodically after breakfast and evening meal, occasionally only over 200  Hypoglycemic episodes occurred about once or twice a week waking up and rarely before lunch  Overnight periods:  Blood sugars are somewhat variable but trending lower after 2 AM and averaging between 118 and 144 overnight at any given time  Preprandial periods: Blood sugars are more likely to be high after breakfast and rarely after supper  Postprandial periods: After breakfast: Blood sugars are variably high with some postprandial peaks and average in the 160s    After lunch: Blood sugars are relatively flat to lower After dinner: Usually not having consistent hyperglycemia, failure the after rebound from low sugar before dinner and usually not exceeding 180  PRE-MEAL Fasting Lunch Dinner Bedtime Overall  Glucose range:  41-178        Mean/median:  118     139   POST-MEAL PC Breakfast PC Lunch PC Dinner  Glucose range:     Mean/median:  163  134  160   PREVIOUS readings:  Mean values apply above for all meters except median for One Touch  PRE-MEAL Fasting Lunch Dinner Bedtime Overall  Glucose range: 75-408   67-420  63-491 64-398    Mean/median:     173+/-93     Symptoms with hypoglycemia: feels weeak and shaky Low sugars are treated with Soft drink or glucose tablets      Meals: 3 meals per day.  Usually has small portions. Eggs or waffle with protein at 7 am; has mid morning snack. Occ hs snack   Evening meal usually balanced at 5 pm with 1-2 carbohydrates  Physical activity: exercise: walking 1-4/7 and some video           Dietician visit: Most recent: 2004            Wt Readings from Last 3 Encounters:  07/18/18 132 lb (59.9 kg)  04/30/18 128 lb (58.1 kg)  04/11/18 128 lb 12.8 oz (57.8 kg)   Complications: are: Mild neuropathy controlled symptomatically  Lab Results  Component Value Date   HGBA1C 7.2 (H) 07/11/2018   HGBA1C 8.1 (H) 04/04/2018   HGBA1C 8.2 (H) 01/10/2018   Lab Results  Component Value Date   MICROALBUR <0.7 07/11/2018   LDLCALC 62 07/11/2018   CREATININE 0.82 07/11/2018     HYPERTENSION:  this has been mild and treated with just a half of the 50 mg losartan.   OSTEOPENIA:  She has had osteopenia with T score -2.3 at the hip in 3/09 and was started on Fosamax at that time.   This was stopped in 5/15. No history of fracture  Last follow-up bone density in 2016: Dual Femur Neck Right 10/03/2015 T score -1.9  Has been given 50,000 units vitamin D every 4 weeks for vitamin D. Deficiency with adequate levels  Lab Results  Component Value Date   VD25OH 64.10 07/11/2018   VD25OH 57.56 01/10/2018      PREVENTIVE CARE:   Annual hemoccults:           12/18  Breast self exams:                             every 2-4 weeks  Colonoscopy/sigmoidoscopy  04/04/14   Mammograms:   4/19  Yearly flu vaccine:                      yes   Bone Density:   2016, T score -2.3  Calcium supplements:  500 mg Tetanus booster:  ?   Zostavax: 2003 Pneumovax [place]:  2019/2015   Diet: Generally low fat  Exercise:  Yard work, Engineer, agricultural exercises or walking  Lab Results  Component Value Date   OCCULTBLD Negative 09/08/2017   OCCULTBLD Negative 04/22/2016   OCCULTBLD Positive (A) 02/12/2014      LABS:  No visits with results within 1 Week(s) from this visit.  Latest known visit with results is:  Lab on 07/11/2018  Component Date Value Ref Range Status  . VITD 07/11/2018 64.10  30.00 - 100.00 ng/mL Final  . Iron 07/11/2018 72  42 - 145 ug/dL Final  . Transferrin 07/11/2018 195.0* 212.0 - 360.0 mg/dL Final  . Saturation Ratios 07/11/2018 26.4  20.0 - 50.0 % Final  . WBC 07/11/2018 8.3  4.0 - 10.5 K/uL Final  . RBC 07/11/2018 3.90  3.87 - 5.11 Mil/uL Final  . Platelets 07/11/2018 271.0  150.0 - 400.0 K/uL Final  . Hemoglobin 07/11/2018 11.9* 12.0 - 15.0 g/dL Final  . HCT 07/11/2018 35.2* 36.0 - 46.0 % Final  . MCV 07/11/2018 90.3  78.0 - 100.0 fl Final  . MCHC 07/11/2018 33.7  30.0 - 36.0 g/dL Final  . RDW 07/11/2018 13.8  11.5 - 15.5 % Final  . Free T4 07/11/2018 1.01  0.60 - 1.60 ng/dL Final   Comment: Specimens from patients who are undergoing biotin therapy and /or ingesting biotin supplements may contain high levels of biotin.  The higher biotin concentration in these specimens interferes with this Free T4 assay.  Specimens that contain high levels  of biotin may cause false high results for this Free T4 assay.  Please interpret results in light of the total clinical presentation of the patient.    Marland Kitchen TSH 07/11/2018 3.19  0.35 - 4.50 uIU/mL Final  . Color, Urine 07/11/2018 YELLOW  Yellow;Lt. Yellow Final  . APPearance 07/11/2018 CLEAR  Clear Final  . Specific Gravity, Urine 07/11/2018 <=1.005* 1.000 - 1.030 Final  . pH 07/11/2018 6.5  5.0 - 8.0  Final  . Total Protein, Urine 07/11/2018 NEGATIVE  Negative Final  . Urine Glucose 07/11/2018 NEGATIVE  Negative Final  . Ketones, ur 07/11/2018 NEGATIVE  Negative Final  . Bilirubin Urine 07/11/2018 NEGATIVE  Negative Final  . Hgb urine dipstick 07/11/2018 NEGATIVE  Negative Final  . Urobilinogen, UA 07/11/2018 0.2  0.0 - 1.0 Final  . Leukocytes, UA 07/11/2018 NEGATIVE  Negative Final  . Nitrite 07/11/2018 NEGATIVE  Negative Final  . WBC, UA 07/11/2018 0-2/hpf  0-2/hpf Final  . RBC / HPF 07/11/2018 none seen  0-2/hpf Final  . Squamous Epithelial / LPF 07/11/2018 Rare(0-4/hpf)  Rare(0-4/hpf) Final  . Cholesterol 07/11/2018 131  0 - 200 mg/dL Final   ATP III Classification       Desirable:  < 200 mg/dL               Borderline High:  200 - 239 mg/dL          High:  > = 240 mg/dL  . Triglycerides 07/11/2018 93.0  0.0 - 149.0 mg/dL Final   Normal:  <150 mg/dLBorderline High:  150 - 199 mg/dL  . HDL 07/11/2018 50.20  >39.00 mg/dL Final  . VLDL 07/11/2018 18.6  0.0 - 40.0 mg/dL Final  . LDL Cholesterol 07/11/2018 62  0 - 99 mg/dL Final  . Total CHOL/HDL Ratio 07/11/2018 3   Final                  Men          Women1/2 Average Risk     3.4  3.3Average Risk          5.0          4.42X Average Risk          9.6          7.13X Average Risk          15.0          11.0                      . NonHDL 07/11/2018 80.63   Final   NOTE:  Non-HDL goal should be 30 mg/dL higher than patient's LDL goal (i.e. LDL goal of < 70 mg/dL, would have non-HDL goal of < 100 mg/dL)  . Microalb, Ur 07/11/2018 <0.7  0.0 - 1.9 mg/dL Final  . Creatinine,U 07/11/2018 22.9  mg/dL Final  . Microalb Creat Ratio 07/11/2018 3.1  0.0 - 30.0 mg/g Final  . Sodium 07/11/2018 140  135 - 145 mEq/L Final  . Potassium 07/11/2018 3.9  3.5 - 5.1 mEq/L Final  . Chloride 07/11/2018 106  96 - 112 mEq/L Final  . CO2 07/11/2018 29  19 - 32 mEq/L Final  . Glucose, Bld 07/11/2018 69* 70 - 99 mg/dL Final  . BUN 07/11/2018 15  6 - 23  mg/dL Final  . Creatinine, Ser 07/11/2018 0.82  0.40 - 1.20 mg/dL Final  . Total Bilirubin 07/11/2018 0.5  0.2 - 1.2 mg/dL Final  . Alkaline Phosphatase 07/11/2018 68  39 - 117 U/L Final  . AST 07/11/2018 17  0 - 37 U/L Final  . ALT 07/11/2018 9  0 - 35 U/L Final  . Total Protein 07/11/2018 6.8  6.0 - 8.3 g/dL Final  . Albumin 07/11/2018 3.9  3.5 - 5.2 g/dL Final  . Calcium 07/11/2018 9.3  8.4 - 10.5 mg/dL Final  . GFR 07/11/2018 71.10  >60.00 mL/min Final  . Hgb [name] MFr Bld 07/11/2018 7.2* 4.6 - 6.5 % Final   Glycemic Control Guidelines for People with Diabetes:Non Diabetic:  <6%Goal of Therapy: <7%Additional Action Suggested:  >8%     Allergies as of 07/18/2018      Reactions   Macrodantin [nitrofurantoin Macrocrystal] Swelling   Penicillins Rash   Has patient had a PCN reaction causing immediate rash, facial/tongue/throat swelling, SOB or lightheadedness with hypotension:Yes Has patient had a PCN reaction causing severe rash involving mucus membranes or skin necrosis: No Has patient had a PCN reaction that required hospitalization: No Has patient had a PCN reaction occurring within the last 10 years:No If all of the above answers are "NO", then may proceed with Cephalosporin use.      Medication List        Accurate as of 07/18/18  9:56 AM. Always use your most recent med list.          Alcohol Pads 70 % Pads USE 4 PER DAY   aspirin 81 MG tablet Take 81 mg by mouth daily.   fish oil-omega-3 fatty acids 1000 MG capsule Take 2 g by mouth daily. Two a day   FREESTYLE LIBRE 14 DAY READER Devi 1 each by Does not apply route daily. USE DAILY TO CHECK BLOOD SUGAR WITH FREESTYLE LIBRE SENSOR.   FREESTYLE LIBRE 14 DAY SENSOR Misc 1 each by Does not apply route daily. APPLY TO ARM EVERY 14 DAYS TO MONITOR BLOOD SUGAR.   gabapentin 100 MG capsule Commonly known as:  NEURONTIN TAKE 3 TO 4 CAPSULES BY MOUTH AT BEDTIME.  insulin degludec 100 UNIT/ML Sopn FlexTouch  Pen Commonly known as:  TRESIBA Inject 0.13 mLs (13 Units total) into the skin daily.   Insulin Pen Needle 32G X 4 MM Misc USE AS DIRECTED FIVE TIMES A DAY.   BD PEN NEEDLE NANO U/F 32G X 4 MM Misc Generic drug:  Insulin Pen Needle USE AS DIRECTED FIVE TIMES A DAY.   levothyroxine 75 MCG tablet Commonly known as:  SYNTHROID, LEVOTHROID TAKE 1 TABLET BY MOUTH DAILY BEFORE BREAKFAST, EXCEPT TAKE 1 AND 1/2 TABLETS ON SUNDAY.   LORazepam 0.5 MG tablet Commonly known as:  ATIVAN Take 1 tablet (0.5 mg total) by mouth 2 (two) times daily as needed for anxiety.   losartan 50 MG tablet Commonly known as:  COZAAR TAKE 1 TABLET BY MOUTH DAILY.   lovastatin 40 MG tablet Commonly known as:  MEVACOR TAKE 1 TABLET BY MOUTH ONCE DAILY WITH A MEAL.   NOVOLOG FLEXPEN 100 UNIT/ML FlexPen Generic drug:  insulin aspart INJECT 3 TO 5 UNITS SUBCUTANEOUSLY BEFORE MEALS AS DIRECTED.   omeprazole 20 MG capsule Commonly known as:  PRILOSEC TAKE 1 CAPSULE BY MOUTH ONCE DAILY.   ONETOUCH VERIO test strip Generic drug:  glucose blood USE TO TEST BLOOD SUGAR 4 TIMES DAILY   PRESERVISION AREDS 2 Caps Take by mouth.   vitamin C 100 MG tablet Take 500 mg by mouth daily.   Vitamin D (Ergocalciferol) 50000 units Caps capsule Commonly known as:  DRISDOL TAKE 1 CAPSULE BY MOUTH ONCE A MONTH.       Allergies:  Allergies  Allergen Reactions  . Macrodantin [Nitrofurantoin Macrocrystal] Swelling  . Penicillins Rash    Has patient had a PCN reaction causing immediate rash, facial/tongue/throat swelling, SOB or lightheadedness with hypotension:Yes Has patient had a PCN reaction causing severe rash involving mucus membranes or skin necrosis: No Has patient had a PCN reaction that required hospitalization: No Has patient had a PCN reaction occurring within the last 10 years:No If all of the above answers are "NO", then may proceed with Cephalosporin use.     Past Medical History:  Diagnosis Date   . Diabetes mellitus without complication [facility])     Past Surgical History:  Procedure Laterality Date  . ABDOMINAL HYSTERECTOMY      Family History  Problem Relation Age of Onset  . Cancer Mother   . Diabetes Brother     Social History:  reports that she has never smoked. She has never used smokeless tobacco. She reports that she does not drink alcohol or use drugs.  Review of Systems:  Review of Systems   Constitutional: Her weight is up slightly  Wt Readings from Last 3 Encounters:  07/18/18 132 lb (59.9 kg)  04/30/18 128 lb (58.1 kg)  04/11/18 128 lb 12.8 oz (58.4 kg)   Eyes: No symptoms of blurred vision. Has regular exams  ENT: No nasal congestion  Respiratory: Negative for cough and shortness of breath.   Cardiovascular: Negative for chest pain, palpitations, pain on walking in her legs and no leg swelling.   Gastrointestinal: May have occasional nausea and reflux symptoms No abdominal pain. Bowel movements are usually normal Has history of chronic reflux generally controlled with Prilosec, may occasionally take Gas-X if she has reflux symptoms during the night with relief   Genitourinary: Negative for frequency.       Nocturia about once.  Has still mild incontinence and wears a pad   Musculoskeletal: Negative for myalgias, low back pain and  joint pain.  Skin: Negative for rash.  Neurological:  No headaches.      No numbness in hands or feet She has had burning in her feet and occasional pain in her legs.  She takes 2 gabapentin at night to relieve symptoms   Endo/Heme/Allergies: Negative for excessive thirst or fatigue.       No heat or cold intolerance.  Psychiatric/Behavioral: Negative for memory loss.    Her moods are good. No insomnia  ANEMIA: Has minimal decrease in hemoglobin without low iron level current   Lab Results  Component Value Date   WBC 8.3 07/11/2018   HGB 11.9 (L) 07/11/2018   HCT 35.2 (L) 07/11/2018   MCV 90.3 07/11/2018    PLT 271.0 07/11/2018    HYPOTHYROIDISM: She has had primary hypothyroidism for over 20 years.  Her levels have been stable with 75 mcg levothyroxine 7.5 tablets per week, has been on this dose since 11/15 No complaints of fatigue  Lab Results  Component Value Date   FREET4 1.01 07/11/2018   FREET4 1.03 07/19/2017   FREET4 0.99 06/29/2016   TSH 3.19 07/11/2018   TSH 2.72 04/04/2018   TSH 2.82 07/19/2017     HYPERLIPIDEMIA: The lipid abnormality consists of elevated LDL,  taking lovastatin with good control.  Lab Results  Component Value Date   CHOL 131 07/11/2018   HDL 50.20 07/11/2018   LDLCALC 62 07/11/2018   TRIG 93.0 07/11/2018   CHOLHDL 3 07/11/2018        Examination:   BP 134/60   Pulse 70   Ht 4\' 11"  (1.499 m)   Wt 132 lb (59.9 kg)   SpO2 98%   BMI 26.66 kg/m   Body mass index is 26.66 kg/m.    GENERAL: Has a small build, well-nourished  EYES:  Externally normal.  Fundii: Exam deferred to ophthalmologist  ENT: Oral cavity & pharynx normal with slight dryness of the tongue.  THYROID:  Not palpable.  CAROTIDS:  Normal character; no bruit.  BREAST exam: External appearance and nipples normal.  No mass palpable on either side.   HEART:  Normal apex, S1 and S2; no murmur or click.  CHEST:  Normal shape.  Lungs:  Vescicular breath sounds heard equally.  No crepitations/ wheeze.  ABDOMEN:  No distention.  Liver and spleen not palpable.   No other mass or tenderness.  No abnormal pulsation in epigastrium.   RECTAL exam:  not indicated  NEUROLOGICAL: Gait is normal She has a resting tremor of her head and mild hesitancy in her voice  Vibration sense at toes is moderately reduced in the distal toes bilaterally.   Reflexes absent at the ankles Biceps reflexes are normal   Diabetic Foot Exam - Simple   Simple Foot Form Diabetic Foot exam was performed with the following findings:  Yes 07/18/2018  9:35 AM  Visual Inspection No deformities, no  ulcerations, no other skin breakdown bilaterally:  Yes Sensation Testing Intact to touch and monofilament testing bilaterally:  Yes Pulse Check Posterior Tibialis and Dorsalis pulse intact bilaterally:  Yes See comments:  Yes Comments Absent left pedal pulses but normal left popliteal Has minimal blistering change of the skin of the distal plantar surface in the middle area bilaterally     SPINE AND JOINTS: Normal peripheral joints, normal spine shape and no deformity.  Skin:  no rash.   ASSESSMENT/ PLAN:   Diabetes type 2, insulin-dependent   See history of present illness for detailed discussion  of current management, blood sugar patterns and problems identified  Her [name] is significantly better at 7.2 compared to 8.1  This is mostly from her being able to do continuous glucose monitoring and ability to adjust her diet and insulin doses more appropriately The libre appears to be accurate with blood sugar testing results Also has less fluctuation in her blood sugars Most of her high readings are after breakfast but not consistent even though she is trying to adjust her doses based on what she is eating She does have a tendency to occasional hypoglycemia with blood sugars as low as 49  Recommendations made today:  REDUCE Tresiba by 1 unit to reduce tendency to periodic hypoglycemia early morning or before meals Continue using the freestyle libre sensor She will continue to adjust the NovoLog between 3-5 units based on meal size and pre-meal blood sugars  DIABETIC neuropathy: Mild with paresthesiae symptoms consistently controlled with gabapentin 200 mg hs  No other complications evident  Hyperlipidemia: Well controlled  Hypertension: Her blood pressure is well-controlled with only 25 mg losartan   Hypothyroidism :her TSH is normal with current regimen of 75 g, 7-1/2 tablets a week  Osteopenia/vitamin D deficiency: Bone density has been stable, has previously taken  bisphosphonate drugs for several years  She will continue taking vitamin D monthly as the levels are therapeutic Consider bone density next year again  Discussed general preventive care measures She is up-to-date with her immunizations  Continue to get regular mammograms Hemoccult will be due in December  Counseling time on subjects discussed in assessment and plan sections is over 50% of the evaluation and management portion of her visit excluding preventive care    Patient Instructions  Check blood sugars on waking up 7 days a week  Also check blood sugars about 2 hours after meals and do this after different meals by rotation  Recommended blood sugar levels on waking up are 90-130 and about 2 hours after meal is 130-160  Please bring your blood sugar monitor to each visit, thank you  Tresiba 12 units          Elayne Snare 07/18/2018, 9:56 AM

## 2018-07-18 ENCOUNTER — Ambulatory Visit (INDEPENDENT_AMBULATORY_CARE_PROVIDER_SITE_OTHER): Payer: PPO | Admitting: Endocrinology

## 2018-07-18 ENCOUNTER — Encounter: Payer: Self-pay | Admitting: Endocrinology

## 2018-07-18 VITALS — BP 134/60 | HR 70 | Ht 59.0 in | Wt 132.0 lb

## 2018-07-18 DIAGNOSIS — Z23 Encounter for immunization: Secondary | ICD-10-CM

## 2018-07-18 DIAGNOSIS — Z0000 Encounter for general adult medical examination without abnormal findings: Secondary | ICD-10-CM | POA: Diagnosis not present

## 2018-07-18 DIAGNOSIS — Z794 Long term (current) use of insulin: Secondary | ICD-10-CM | POA: Diagnosis not present

## 2018-07-18 DIAGNOSIS — M810 Age-related osteoporosis without current pathological fracture: Secondary | ICD-10-CM

## 2018-07-18 DIAGNOSIS — E7800 Pure hypercholesterolemia, unspecified: Secondary | ICD-10-CM | POA: Diagnosis not present

## 2018-07-18 DIAGNOSIS — E1165 Type 2 diabetes mellitus with hyperglycemia: Secondary | ICD-10-CM | POA: Diagnosis not present

## 2018-07-18 DIAGNOSIS — E063 Autoimmune thyroiditis: Secondary | ICD-10-CM | POA: Diagnosis not present

## 2018-07-18 MED ORDER — FREESTYLE LIBRE 14 DAY SENSOR MISC: 1.0000 | Freq: Every day | 3 refills | Status: DC

## 2018-07-18 NOTE — Patient Instructions (Signed)
Check blood sugars on waking up 7 days a week  Also check blood sugars about 2 hours after meals and do this after different meals by rotation  Recommended blood sugar levels on waking up are 90-130 and about 2 hours after meal is 130-160  Please bring your blood sugar monitor to each visit, thank you  Tresiba 12 units

## 2018-07-19 ENCOUNTER — Telehealth: Payer: Self-pay | Admitting: Endocrinology

## 2018-07-19 NOTE — Telephone Encounter (Signed)
Blood sugar check question. Different from what she is use to doing and needs clarification.

## 2018-07-19 NOTE — Telephone Encounter (Signed)
Patient had a question about when she should be checking her blood sugar and I explained this to her

## 2018-07-25 DIAGNOSIS — E119 Type 2 diabetes mellitus without complications: Secondary | ICD-10-CM | POA: Diagnosis not present

## 2018-07-25 DIAGNOSIS — H0102B Squamous blepharitis left eye, upper and lower eyelids: Secondary | ICD-10-CM | POA: Diagnosis not present

## 2018-07-25 DIAGNOSIS — Z9849 Cataract extraction status, unspecified eye: Secondary | ICD-10-CM | POA: Diagnosis not present

## 2018-07-25 DIAGNOSIS — H18413 Arcus senilis, bilateral: Secondary | ICD-10-CM | POA: Diagnosis not present

## 2018-07-25 DIAGNOSIS — H353131 Nonexudative age-related macular degeneration, bilateral, early dry stage: Secondary | ICD-10-CM | POA: Diagnosis not present

## 2018-07-25 DIAGNOSIS — H0102A Squamous blepharitis right eye, upper and lower eyelids: Secondary | ICD-10-CM | POA: Diagnosis not present

## 2018-07-25 DIAGNOSIS — H40023 Open angle with borderline findings, high risk, bilateral: Secondary | ICD-10-CM | POA: Diagnosis not present

## 2018-07-25 DIAGNOSIS — H04123 Dry eye syndrome of bilateral lacrimal glands: Secondary | ICD-10-CM | POA: Diagnosis not present

## 2018-07-25 DIAGNOSIS — H52223 Regular astigmatism, bilateral: Secondary | ICD-10-CM | POA: Diagnosis not present

## 2018-07-25 DIAGNOSIS — Z961 Presence of intraocular lens: Secondary | ICD-10-CM | POA: Diagnosis not present

## 2018-07-25 DIAGNOSIS — H5203 Hypermetropia, bilateral: Secondary | ICD-10-CM | POA: Diagnosis not present

## 2018-07-25 DIAGNOSIS — H11153 Pinguecula, bilateral: Secondary | ICD-10-CM | POA: Diagnosis not present

## 2018-07-25 LAB — HM DIABETES EYE EXAM

## 2018-08-12 ENCOUNTER — Encounter: Payer: Self-pay | Admitting: Endocrinology

## 2018-09-12 DIAGNOSIS — J309 Allergic rhinitis, unspecified: Secondary | ICD-10-CM | POA: Diagnosis not present

## 2018-09-12 DIAGNOSIS — J209 Acute bronchitis, unspecified: Secondary | ICD-10-CM | POA: Diagnosis not present

## 2018-10-07 ENCOUNTER — Other Ambulatory Visit: Payer: Self-pay | Admitting: Endocrinology

## 2018-10-07 DIAGNOSIS — Z794 Long term (current) use of insulin: Principal | ICD-10-CM

## 2018-10-07 DIAGNOSIS — G629 Polyneuropathy, unspecified: Secondary | ICD-10-CM

## 2018-10-07 DIAGNOSIS — E1165 Type 2 diabetes mellitus with hyperglycemia: Secondary | ICD-10-CM

## 2018-10-07 DIAGNOSIS — E063 Autoimmune thyroiditis: Secondary | ICD-10-CM

## 2018-10-10 ENCOUNTER — Other Ambulatory Visit (INDEPENDENT_AMBULATORY_CARE_PROVIDER_SITE_OTHER): Payer: HMO

## 2018-10-10 DIAGNOSIS — E1165 Type 2 diabetes mellitus with hyperglycemia: Secondary | ICD-10-CM

## 2018-10-10 DIAGNOSIS — Z794 Long term (current) use of insulin: Secondary | ICD-10-CM

## 2018-10-10 DIAGNOSIS — E063 Autoimmune thyroiditis: Secondary | ICD-10-CM | POA: Diagnosis not present

## 2018-10-10 DIAGNOSIS — G629 Polyneuropathy, unspecified: Secondary | ICD-10-CM

## 2018-10-10 LAB — COMPREHENSIVE METABOLIC PANEL
ALT: 14 U/L (ref 0–35)
AST: 20 U/L (ref 0–37)
Albumin: 3.9 g/dL (ref 3.5–5.2)
Alkaline Phosphatase: 66 U/L (ref 39–117)
BUN: 18 mg/dL (ref 6–23)
CO2: 28 mEq/L (ref 19–32)
Calcium: 9.6 mg/dL (ref 8.4–10.5)
Chloride: 105 mEq/L (ref 96–112)
Creatinine, Ser: 0.89 mg/dL (ref 0.40–1.20)
GFR: 64.65 mL/min (ref >60.00)
Glucose, Bld: 74 mg/dL (ref 70–99)
Potassium: 3.6 mEq/L (ref 3.5–5.1)
Sodium: 140 mEq/L (ref 135–145)
Total Bilirubin: 0.4 mg/dL (ref 0.2–1.2)
Total Protein: 6.7 g/dL (ref 6.0–8.3)

## 2018-10-10 LAB — TSH: TSH: 1.08 u[IU]/mL (ref 0.35–4.50)

## 2018-10-10 LAB — VITAMIN B12: Vitamin B-12: 423 pg/mL (ref 211–911)

## 2018-10-10 LAB — CBC
HCT: 35.1 % — ABNORMAL LOW (ref 36.0–46.0)
Hemoglobin: 11.7 g/dL — ABNORMAL LOW (ref 12.0–15.0)
MCHC: 33.4 g/dL (ref 30.0–36.0)
MCV: 90.7 fl (ref 78.0–100.0)
Platelets: 271 10*3/uL (ref 150.0–400.0)
RBC: 3.87 Mil/uL (ref 3.87–5.11)
RDW: 13.6 % (ref 11.5–15.5)
WBC: 8.5 10*3/uL (ref 4.0–10.5)

## 2018-10-10 LAB — HEMOGLOBIN A1C: Hgb A1c MFr Bld: 7.5 % — ABNORMAL HIGH (ref 4.6–6.5)

## 2018-10-10 LAB — T4, FREE: Free T4: 1.14 ng/dL (ref 0.60–1.60)

## 2018-10-17 ENCOUNTER — Ambulatory Visit (INDEPENDENT_AMBULATORY_CARE_PROVIDER_SITE_OTHER): Payer: HMO | Admitting: Endocrinology

## 2018-10-17 ENCOUNTER — Encounter: Payer: Self-pay | Admitting: Endocrinology

## 2018-10-17 ENCOUNTER — Other Ambulatory Visit: Payer: Self-pay

## 2018-10-17 VITALS — BP 132/60 | HR 81 | Ht 59.0 in | Wt 131.0 lb

## 2018-10-17 DIAGNOSIS — E559 Vitamin D deficiency, unspecified: Secondary | ICD-10-CM

## 2018-10-17 DIAGNOSIS — D508 Other iron deficiency anemias: Secondary | ICD-10-CM

## 2018-10-17 DIAGNOSIS — Z794 Long term (current) use of insulin: Secondary | ICD-10-CM

## 2018-10-17 DIAGNOSIS — E1165 Type 2 diabetes mellitus with hyperglycemia: Secondary | ICD-10-CM | POA: Diagnosis not present

## 2018-10-17 DIAGNOSIS — E063 Autoimmune thyroiditis: Secondary | ICD-10-CM | POA: Diagnosis not present

## 2018-10-17 MED ORDER — INSULIN ASPART 100 UNIT/ML FLEXPEN: PEN_INJECTOR | SUBCUTANEOUS | 3 refills | Status: DC

## 2018-10-17 NOTE — Patient Instructions (Signed)
Less Novolog for lo carb mea;ls  Juice/coke for lo sugars

## 2018-10-17 NOTE — Progress Notes (Signed)
Patient ID: Jennifer Bailey, female   DOB: May 17, 1935, 84 y.o.   MRN: [id number]   Chief complaint: follow up    History of Present Illness     Type 2 DIABETES MELITUS, date of diagnosis: 2003  She has been on insulin for several years but continues to have labile blood sugars despite using basal bolus regimen  She is very compliant with her insulin at mealtimes  Is taking small amounts of insulin and is not on any oral hypoglycemics She was changed from Levemir to [place] insulin in 1/17 because of relatively inconsistent control   RECENT history:   Insulin regimen: Tresiba 12 units;   mealtime NovoLog coverage: At breakfast:  3 units, 3-5  lunch and 4 at supper    [name] is 7.5 and still fairly good Has been as low as 7.2  Current blood sugar trends from monitor download, management and problems identified:   Tyler Aas was reduced to 12 units on her last visit because of low normal sugars in the mornings at times  Her blood sugars appear to be fluctuating mostly in the afternoons  She is still trying to adjust her dose based on her pre-meal blood sugar mostly and sometimes based on what she is eating  Some of her low sugar readings are not associated with hyperglycemic symptoms but even with mild hypoglycemia she tends to have significant rebound  This is likely to be from her eating food and sometimes sweets when her blood sugar is low normal or low  Highest reading after rebound has been about 339  Yesterday at lunch she had only a small amount of carbohydrate and with taking 4 units her blood sugar was low normal in the afternoon and then rebounded up to 217 around suppertime  The Overnight blood sugars are very stable and mostly in the good range without hypoglycemia  She may have a tendency to low sugars mostly late afternoon or before dinnertime not related to activity  Blood sugars are variably high after breakfast based on what she is eating and may not  be getting enough insulin sometimes with low normal fasting readings  Will take insulin with her to inject when she is eating out .          Monitors blood glucose:  Usually up to 4 times a day.    Glucometer:  Freestyle libre         Blood Glucose readings from meter download:  CGM use % of time  97  2-week average/SD  148+/-53  Time in range      79  %  % Time Above 180  19  % Time above 250  6  % Time Below 70  2     PRE-MEAL Fasting Lunch Dinner Bedtime Overall  Glucose range:       Averages:  137   150  137    POST-MEAL PC Breakfast PC Lunch PC Dinner  Glucose range:     Averages:  177  156  141   Symptoms with hypoglycemia: feels weak and shaky Low sugars are treated with Soft drink or glucose tablets      Meals: 3 meals per day.  Usually has small portions. Eggs/bread or one pancake with protein at 7 am; has mid morning snack.  Has hs snack if blood sugar not high Evening meal usually balanced at 5 pm with 1-2 carbohydrates  Physical activity: exercise: walking 2/7 and some lawnmowing  Dietician visit: Most recent: 2004            Wt Readings from Last 3 Encounters:  10/17/18 131 lb (59.4 kg)  07/18/18 132 lb (59.9 kg)  04/30/18 128 lb (99.8 kg)   Complications: are: Mild neuropathy controlled symptomatically  Lab Results  Component Value Date   HGBA1C 7.5 (H) 10/10/2018   HGBA1C 7.2 (H) 07/11/2018   HGBA1C 8.1 (H) 04/04/2018   Lab Results  Component Value Date   MICROALBUR <0.7 07/11/2018   LDLCALC 62 07/11/2018   CREATININE 0.89 10/10/2018    HYPERTENSION:  this has been mild and treated with a half of the 50 mg losartan.  She is checking her blood pressure at home   BP Readings from Last 3 Encounters:  10/17/18 132/60  07/18/18 134/60  04/30/18 (!) 179/67    OSTEOPENIA:  She has had osteopenia with T score -2.3 at the hip in 3/09 and was started on Fosamax at that time. This was stopped in 02/2014.   No history of fracture  follow-up  bone density in 2016: DualFemur Neck Right 10/03/2015 T score -1.9   History of vitamin D Deficiency, she is taking 50,000 units monthly with adequate levels  Lab Results  Component Value Date   VD25OH 64.10 07/11/2018   VD25OH 57.56 01/10/2018    OTHER active problems: See review of systems   LABS:    No visits with results within 1 Week(s) from this visit.  Latest known visit with results is:  Lab on 10/10/2018  Component Date Value Ref Range Status  . Vitamin B-12 10/10/2018 423  211 - 911 pg/mL Final  . WBC 10/10/2018 8.5  4.0 - 10.5 K/uL Final  . RBC 10/10/2018 3.87  3.87 - 5.11 Mil/uL Final  . Platelets 10/10/2018 271.0  150.0 - 400.0 K/uL Final  . Hemoglobin 10/10/2018 11.7* 12.0 - 15.0 g/dL Final  . HCT 10/10/2018 35.1* 36.0 - 46.0 % Final  . MCV 10/10/2018 90.7  78.0 - 100.0 fl Final  . MCHC 10/10/2018 33.4  30.0 - 36.0 g/dL Final  . RDW 10/10/2018 13.6  11.5 - 15.5 % Final  . Free T4 10/10/2018 1.14  0.60 - 1.60 ng/dL Final   Comment: Specimens from patients who are undergoing biotin therapy and /or ingesting biotin supplements may contain high levels of biotin.  The higher biotin concentration in these specimens interferes with this Free T4 assay.  Specimens that contain high levels  of biotin may cause false high results for this Free T4 assay.  Please interpret results in light of the total clinical presentation of the patient.    Marland Kitchen TSH 10/10/2018 1.08  0.35 - 4.50 uIU/mL Final  . Sodium 10/10/2018 140  135 - 145 mEq/L Final  . Potassium 10/10/2018 3.6  3.5 - 5.1 mEq/L Final  . Chloride 10/10/2018 105  96 - 112 mEq/L Final  . CO2 10/10/2018 28  19 - 32 mEq/L Final  . Glucose, Bld 10/10/2018 74  70 - 99 mg/dL Final  . BUN 10/10/2018 18  6 - 23 mg/dL Final  . Creatinine, Ser 10/10/2018 0.89  0.40 - 1.20 mg/dL Final  . Total Bilirubin 10/10/2018 0.4  0.2 - 1.2 mg/dL Final  . Alkaline Phosphatase 10/10/2018 66  39 - 117 U/L Final  . AST 10/10/2018 20  0 -  37 U/L Final  . ALT 10/10/2018 14  0 - 35 U/L Final  . Total Protein 10/10/2018 6.7  6.0 - 8.3 g/dL Final  .  Albumin 10/10/2018 3.9  3.5 - 5.2 g/dL Final  . Calcium 10/10/2018 9.6  8.4 - 10.5 mg/dL Final  . GFR 10/10/2018 64.65  >60.00 mL/min Final  . Hgb [name] MFr Bld 10/10/2018 7.5* 4.6 - 6.5 % Final   Glycemic Control Guidelines for People with Diabetes:Non Diabetic:  <6%Goal of Therapy: <7%Additional Action Suggested:  >8%     Allergies as of 10/17/2018      Reactions   Macrodantin [nitrofurantoin Macrocrystal] Swelling   Penicillins Rash   Has patient had a PCN reaction causing immediate rash, facial/tongue/throat swelling, SOB or lightheadedness with hypotension:Yes Has patient had a PCN reaction causing severe rash involving mucus membranes or skin necrosis: No Has patient had a PCN reaction that required hospitalization: No Has patient had a PCN reaction occurring within the last 10 years:No If all of the above answers are "NO", then may proceed with Cephalosporin use.      Medication List       Accurate as of October 17, 2018  9:02 AM. Always use your most recent med list.        Alcohol Pads 70 % Pads USE 4 PER DAY   aspirin 81 MG tablet Take 81 mg by mouth daily.   fish oil-omega-3 fatty acids 1000 MG capsule Take 2 g by mouth daily. Two a day   FREESTYLE LIBRE 14 DAY READER Devi 1 each by Does not apply route daily. USE DAILY TO CHECK BLOOD SUGAR WITH FREESTYLE LIBRE SENSOR.   FREESTYLE LIBRE 14 DAY SENSOR Misc 1 each by Does not apply route daily. APPLY TO ARM EVERY 14 DAYS TO MONITOR BLOOD SUGAR.   gabapentin 100 MG capsule Commonly known as:  NEURONTIN TAKE 3 TO 4 CAPSULES BY MOUTH AT BEDTIME.   insulin degludec 100 UNIT/ML Sopn FlexTouch Pen Commonly known as:  TRESIBA FLEXTOUCH Inject 0.13 mLs (13 Units total) into the skin daily.   Insulin Pen Needle 32G X 4 MM Misc Commonly known as:  BD PEN NEEDLE NANO U/F USE AS DIRECTED FIVE TIMES A DAY.   BD  PEN NEEDLE NANO U/F 32G X 4 MM Misc Generic drug:  Insulin Pen Needle USE AS DIRECTED FIVE TIMES A DAY.   levothyroxine 75 MCG tablet Commonly known as:  SYNTHROID, LEVOTHROID TAKE 1 TABLET BY MOUTH DAILY BEFORE BREAKFAST, EXCEPT TAKE 1 AND 1/2 TABLETS ON SUNDAY.   losartan 50 MG tablet Commonly known as:  COZAAR TAKE 1 TABLET BY MOUTH DAILY.   lovastatin 40 MG tablet Commonly known as:  MEVACOR TAKE 1 TABLET BY MOUTH ONCE DAILY WITH A MEAL.   NOVOLOG FLEXPEN 100 UNIT/ML FlexPen Generic drug:  insulin aspart INJECT 3 TO 5 UNITS SUBCUTANEOUSLY BEFORE MEALS AS DIRECTED.   PRESERVISION AREDS 2 Caps Take by mouth.   vitamin C 100 MG tablet Take 500 mg by mouth daily.   Vitamin D (Ergocalciferol) 1.25 MG (50000 UT) Caps capsule Commonly known as:  DRISDOL TAKE 1 CAPSULE BY MOUTH ONCE A MONTH.       Allergies:  Allergies  Allergen Reactions  . Macrodantin [Nitrofurantoin Macrocrystal] Swelling  . Penicillins Rash    Has patient had a PCN reaction causing immediate rash, facial/tongue/throat swelling, SOB or lightheadedness with hypotension:Yes Has patient had a PCN reaction causing severe rash involving mucus membranes or skin necrosis: No Has patient had a PCN reaction that required hospitalization: No Has patient had a PCN reaction occurring within the last 10 years:No If all of the above answers are "NO",  then may proceed with Cephalosporin use.     Past Medical History:  Diagnosis Date  . Diabetes mellitus without complication [facility])     Past Surgical History:  Procedure Laterality Date  . ABDOMINAL HYSTERECTOMY      Family History  Problem Relation Age of Onset  . Cancer Mother   . Diabetes Brother     Social History:  reports that she has never smoked. She has never used smokeless tobacco. She reports that she does not drink alcohol or use drugs. :  Review of Systems    Last colonoscopy showed only hemorrhoids in 7/15 but she has been recommended  follow-up in 5 years  ANEMIA: Her hemoglobin is slightly lower B12 normal Hemoccult was negative in 12/18   Lab Results  Component Value Date   WBC 8.5 10/10/2018   HGB 11.7 (L) 10/10/2018   HCT 35.1 (L) 10/10/2018   MCV 90.7 10/10/2018   PLT 271.0 10/10/2018    HYPOTHYROIDISM: She has had primary hypothyroidism, long-standing . Her TSH levels have been stable with 75 mcg levothyroxine 7.5 tablets per week, has been on this dose since 11/15   Lab Results  Component Value Date   FREET4 1.14 10/10/2018   FREET4 1.01 07/11/2018   FREET4 1.03 07/19/2017   TSH 1.08 10/10/2018   TSH 3.19 07/11/2018   TSH 2.72 04/04/2018     HYPERLIPIDEMIA: The lipid abnormality consists of elevated LDL,  taking lovastatin with consistently good control.  Lab Results  Component Value Date   CHOL 131 07/11/2018   HDL 50.20 07/11/2018   LDLCALC 62 07/11/2018   TRIG 93.0 07/11/2018   CHOLHDL 3 07/11/2018        Examination:   BP 132/60 (BP Location: Left Arm, Patient Position: Sitting, Cuff Size: Normal)   Pulse 81   Ht 4\' 11"  (1.499 m)   Wt 131 lb (59.4 kg)   SpO2 98%   BMI 26.46 kg/m   Body mass index is 26.46 kg/m.    ASSESSMENT/ PLAN:     Diabetes type 2 on insulin   See history of present illness for detailed discussion of current management, blood sugar patterns and problems identified  Her [name] is improved at 7.5  With using the freestyle libre sensor however blood sugars have been more stable and less variable and she is able to monitor this more closely Likely able to adjust her mealtime dose better with watching her postprandial readings However still has some inconsistent patterns especially after lunch Most of her significantly high readings have been from overtreating low normal or low sugar episodes with sweets are excessive snacks  Fasting readings are reasonably good at this time with no overnight hypoglycemia and will continue Tresiba unchanged especially  since she may occasionally have low normal readings before dinner Discussed further adjustment of mealtime dose based on amount of carbohydrate, she may be needing 1 to 2 units less when carbohydrate intake is relatively small and recent dietary history reviewed  Hypertension: Her blood pressure is well-controlled with only 25 mg losartan  Hypothyroidism :her TSH is again normal  ANEMIA: We will continue to follow, currently hemoglobin 11.7 and B12 is normal  Preventive care and history of polyps: She will follow-up with gastroenterologist  Counseling time on subjects discussed in assessment and plan sections is over 50% of today's 25 minute visit      There are no Patient Instructions on file for this visit.      Elayne Snare 10/17/2018, 9:02  AM    

## 2018-10-31 DIAGNOSIS — Z86010 Personal history of colonic polyps: Secondary | ICD-10-CM | POA: Diagnosis not present

## 2018-10-31 DIAGNOSIS — R1010 Upper abdominal pain, unspecified: Secondary | ICD-10-CM | POA: Diagnosis not present

## 2018-11-16 ENCOUNTER — Other Ambulatory Visit: Payer: Self-pay | Admitting: Endocrinology

## 2018-11-24 ENCOUNTER — Other Ambulatory Visit: Payer: Self-pay

## 2018-11-24 NOTE — Patient Outreach (Signed)
  [place] [facility]) Care Management Chronic Special Needs Program   11/24/2018  Name: Jennifer Bailey, DOB: [birth year]  MRN: [id number]  The client was discussed in an interdisciplinary care team meeting.  The following issues were discussed:  Client's needs, Key risk triggers/risk stratification, Care Plan and Issues/barriers to care  Participants present: Mahlon Gammon, RNCM; Peter Garter, RNCM; Thea Silversmith, RNCM  Recommendations: Diabetes program; Send education regarding hypertension disease.  Plan: RNCM will send patient copy of care plan; send primary care copy of care plan; send educational materials.  Follow-up: 2-4 months.  Thea Silversmith, RN, MSN, [place] [phone]

## 2018-12-13 DIAGNOSIS — R1010 Upper abdominal pain, unspecified: Secondary | ICD-10-CM | POA: Diagnosis not present

## 2018-12-13 DIAGNOSIS — D128 Benign neoplasm of rectum: Secondary | ICD-10-CM | POA: Diagnosis not present

## 2018-12-13 DIAGNOSIS — D122 Benign neoplasm of ascending colon: Secondary | ICD-10-CM | POA: Diagnosis not present

## 2018-12-13 DIAGNOSIS — K2930 Chronic superficial gastritis without bleeding: Secondary | ICD-10-CM | POA: Diagnosis not present

## 2018-12-13 DIAGNOSIS — R1013 Epigastric pain: Secondary | ICD-10-CM | POA: Diagnosis not present

## 2018-12-13 DIAGNOSIS — Z86010 Personal history of colonic polyps: Secondary | ICD-10-CM | POA: Diagnosis not present

## 2018-12-14 ENCOUNTER — Other Ambulatory Visit: Payer: Self-pay | Admitting: Endocrinology

## 2018-12-19 DIAGNOSIS — K2930 Chronic superficial gastritis without bleeding: Secondary | ICD-10-CM | POA: Diagnosis not present

## 2018-12-19 DIAGNOSIS — D122 Benign neoplasm of ascending colon: Secondary | ICD-10-CM | POA: Diagnosis not present

## 2018-12-19 DIAGNOSIS — D128 Benign neoplasm of rectum: Secondary | ICD-10-CM | POA: Diagnosis not present

## 2019-01-09 ENCOUNTER — Other Ambulatory Visit: Payer: BC Managed Care – PPO

## 2019-01-16 ENCOUNTER — Ambulatory Visit: Payer: BC Managed Care – PPO | Admitting: Endocrinology

## 2019-01-17 ENCOUNTER — Other Ambulatory Visit: Payer: Self-pay

## 2019-01-17 NOTE — Patient Outreach (Addendum)
  [facility] [facility]) Care Management Chronic Special Needs Program  01/17/2019  Name: Jennifer Bailey DOB: October 09, 1934  MRN: [id number]  Jennifer Bailey is enrolled in a Chronic Special Needs Plan. RNCM called to follow up and discuss COVID-19 precautions. No answer. Unable to leave message.   Plan: Chronic care management coordinator will attempt outreach in 1-2 weeks.  Thea Silversmith, RN, MSN, [place] [phone]

## 2019-01-20 ENCOUNTER — Ambulatory Visit: Payer: Self-pay

## 2019-01-27 ENCOUNTER — Other Ambulatory Visit: Payer: Self-pay

## 2019-01-27 NOTE — Patient Outreach (Signed)
  [facility] [facility]) Care Management Chronic Special Needs Program  01/27/2019  Name: Jennifer Bailey DOB: May 18, 1935  MRN: [id number]  Ms. Jennifer Bailey is enrolled in a chronic special needs plan for Diabetes. Chronic Care Management Coordinator telephoned client to review health risk assessment and to develop individualized care plan.  Introduced the chronic care management program, importance of client participation, and taking their care plan to all provider appointments and inpatient facilities.   Subjective: client reports a history of diabetes and hypertension. She states she continues to be active exercising to a 30 minute video almost everyday and also active outside. She states she has been a diabetic for over 15 years. Client reports she has a continuous glucose monitoring system and checks her blood sugars at least 4 times/day. [name] states her blood sugar this morning was 90. She reports her blood sugar was "60 something within the past two weeks. Mrs. Jennifer Bailey reports she can usually tell when her blood sugar is low, and has glucagon and candy always available. She reports she usually eats a snack at night. She reports she has an appointment with her endocrinologist next week. With the COVID crisis she states she is going to call for further direction. She denies any difficulty in managing medications and denies any medication questions.   Goals Addressed            This Visit's Progress   . Client understands the importance of follow-up with providers by attending scheduled visits   On track   . Client will use Assistive Devices as needed and verbalize understanding of device use   On track   . Client will verbalize knowledge of self management of Hypertension as evidences by BP reading of 140/90 or less; or as defined by provider   On track   . HEMOGLOBIN [name] < 7.0       Diabetes self management actions: Glucose monitoring per provider recommendations Perform  Quality checks on blood meter Eat Healthy Check feet daily Visit provider every 3-6 months as directed Hbg [name] level every 3-6 months. Eye Exam yearly    . Maintain timely refills of diabetic medication as prescribed within the year .   On track   . Obtain annual  Lipid Profile, LDL-C   On track   . Obtain Annual Eye (retinal)  Exam    On track   . Obtain Annual Foot Exam   On track   . Obtain annual screen for micro albuminuria (urine) , nephropathy (kidney problems)   On track   . Obtain Hemoglobin [name] at least 2 times per year   On track   . Visit Primary Care Provider or Endocrinologist at least 2 times per year    On track     [facility] discussed the importance of avoiding hypoglycemic events.  RNCM encouraged client to call 24 hour nurse advice line as needed. Also encouraged client to call Health care concierge for benefits questions. Also encouraged client to call RNCM as needed.   Plan:  Send successful outreach letter with a copy of their individualized care plan, Send individual care plan to provider and Send educational material. Chronic care management coordination will outreach in 7 months.   Thea Silversmith, RN, MSN, [name] [phone]

## 2019-02-06 ENCOUNTER — Ambulatory Visit: Payer: BC Managed Care – PPO | Admitting: Endocrinology

## 2019-02-06 ENCOUNTER — Other Ambulatory Visit: Payer: Self-pay

## 2019-02-06 ENCOUNTER — Other Ambulatory Visit (INDEPENDENT_AMBULATORY_CARE_PROVIDER_SITE_OTHER): Payer: HMO

## 2019-02-06 DIAGNOSIS — E559 Vitamin D deficiency, unspecified: Secondary | ICD-10-CM | POA: Diagnosis not present

## 2019-02-06 DIAGNOSIS — Z794 Long term (current) use of insulin: Secondary | ICD-10-CM | POA: Diagnosis not present

## 2019-02-06 DIAGNOSIS — D508 Other iron deficiency anemias: Secondary | ICD-10-CM | POA: Diagnosis not present

## 2019-02-06 DIAGNOSIS — E1165 Type 2 diabetes mellitus with hyperglycemia: Secondary | ICD-10-CM

## 2019-02-06 LAB — IBC PANEL
Iron: 62 ug/dL (ref 42–145)
Saturation Ratios: 24.9 % (ref 20.0–50.0)
Transferrin: 178 mg/dL — ABNORMAL LOW (ref 212.0–360.0)

## 2019-02-06 LAB — HEMOGLOBIN A1C: Hgb A1c MFr Bld: 7.8 % — ABNORMAL HIGH (ref 4.6–6.5)

## 2019-02-06 LAB — CBC
HCT: 34.8 % — ABNORMAL LOW (ref 36.0–46.0)
Hemoglobin: 11.9 g/dL — ABNORMAL LOW (ref 12.0–15.0)
MCHC: 34.1 g/dL (ref 30.0–36.0)
MCV: 90.2 fl (ref 78.0–100.0)
Platelets: 269 10*3/uL (ref 150.0–400.0)
RBC: 3.85 Mil/uL — ABNORMAL LOW (ref 3.87–5.11)
RDW: 13.7 % (ref 11.5–15.5)
WBC: 8.3 10*3/uL (ref 4.0–10.5)

## 2019-02-06 LAB — BASIC METABOLIC PANEL
BUN: 16 mg/dL (ref 6–23)
CO2: 26 mEq/L (ref 19–32)
Calcium: 8.8 mg/dL (ref 8.4–10.5)
Chloride: 103 mEq/L (ref 96–112)
Creatinine, Ser: 0.81 mg/dL (ref 0.40–1.20)
GFR: 67.75 mL/min (ref >60.00)
Glucose, Bld: 187 mg/dL — ABNORMAL HIGH (ref 70–99)
Potassium: 4.1 mEq/L (ref 3.5–5.1)
Sodium: 136 mEq/L (ref 135–145)

## 2019-02-06 LAB — VITAMIN D 25 HYDROXY (VIT D DEFICIENCY, FRACTURES): VITD: 60.16 ng/mL (ref 30.00–100.00)

## 2019-02-13 ENCOUNTER — Ambulatory Visit (INDEPENDENT_AMBULATORY_CARE_PROVIDER_SITE_OTHER): Payer: HMO | Admitting: Endocrinology

## 2019-02-13 ENCOUNTER — Other Ambulatory Visit: Payer: Self-pay | Admitting: Endocrinology

## 2019-02-13 ENCOUNTER — Encounter: Payer: Self-pay | Admitting: Endocrinology

## 2019-02-13 ENCOUNTER — Other Ambulatory Visit: Payer: Self-pay

## 2019-02-13 DIAGNOSIS — D638 Anemia in other chronic diseases classified elsewhere: Secondary | ICD-10-CM | POA: Diagnosis not present

## 2019-02-13 DIAGNOSIS — E7800 Pure hypercholesterolemia, unspecified: Secondary | ICD-10-CM | POA: Diagnosis not present

## 2019-02-13 DIAGNOSIS — Z794 Long term (current) use of insulin: Secondary | ICD-10-CM

## 2019-02-13 DIAGNOSIS — I10 Essential (primary) hypertension: Secondary | ICD-10-CM | POA: Diagnosis not present

## 2019-02-13 DIAGNOSIS — E1165 Type 2 diabetes mellitus with hyperglycemia: Secondary | ICD-10-CM

## 2019-02-13 MED ORDER — LOVASTATIN 40 MG PO TABS: ORAL_TABLET | ORAL | 4 refills | Status: DC

## 2019-02-13 MED ORDER — GABAPENTIN 100 MG PO CAPS: ORAL_CAPSULE | ORAL | 5 refills | Status: DC

## 2019-02-13 NOTE — Progress Notes (Signed)
Patient ID: Jennifer Bailey, female   DOB: October 07, 1934, 84 y.o.   MRN: [id number]   Today's office visit was provided via telemedicine using audio technique Explained to the patient and the the limitations of evaluation and management by telemedicine and the availability of in person appointments.  The patient understood the limitations and agreed to proceed. Patient also understood that the telehealth visit is billable. . Location of the patient: Home . Location of the provider: Office Only the patient and myself were participating in the encounter   Chief complaint: follow up    History of Present Illness     Type 2 DIABETES MELITUS, date of diagnosis: 2003  She has been on insulin for several years but continues to have labile blood sugars despite using basal bolus regimen  She is very compliant with her insulin at mealtimes  Is taking small amounts of insulin and is not on any oral hypoglycemics She was changed from Levemir to [place] insulin in 1/17 because of relatively inconsistent control   RECENT history:   Insulin regimen: Tresiba 12 units in a.m.;   mealtime NovoLog coverage: At breakfast:  3 units, 3-5  lunch and 4 at supper    [name] is slightly higher at 7.8 compared to 7.5 Appears to be gradually going up   Current blood sugar trends from freestyle [employer], management and problems identified:   Her blood sugar recordings are few days old  However her fasting blood sugars appear to be frequently below 100 and she has not called to report mild hypoglycemia at times including fasting  She is eating out a little less but whenever she does she may only rarely forget her insulin when she goes out  She is mostly adjusting her mealtime dose based on pre-meal blood sugar and not always on what she is eating  With this LOWEST blood sugars postprandially are after lunch.   She generally takes about 4 units at lunchtime of her NovoLog with some  adjustment based on her blood sugar level  She may be having high readings before lunch and dinner because of having snacks in between        Monitors blood glucose:  Usually up to 4 times a day.    Glucometer:  [employer]         She thinks her freestyle libre readings are reasonably close to her actual fingerstick readings   CONTINUOUS GLUCOSE MONITORING RECORD INTERPRETATION    Dates of Recording: 4/21 through 02/06/2019  Sensor description: [employer]  Results statistics:   CGM use % of time  97  Average and SD  136+/-35  Time in range     80   %, was 79  % Time Above 180  12  % Time above 250  2  % Time Below target  6    Glycemic patterns summary: Blood sugars show readings within target most of the time with the lowest readings early morning and early afternoon but some variability and postprandial readings   Hyperglycemic episodes have occurred mostly after meals with more events after breakfast and lunch some tendency to variability, and tends to have more rebound if blood sugars are low prior to the meal Had one episode of blood sugar going up over 350 with not taking mealtime insulin at lunch  Hypoglycemic episodes occurred overnight, occasionally after lunch and only once  Overnight periods: Blood sugars are trending lower between midnight and 4 AM with readings below 70  at least on 3 nights and lowest recorded blood sugar 55  Preprandial periods: Blood sugars are near normal or low before breakfast Blood sugars at lunchtime tend to be relatively higher and averaging 160 with variability Also blood sugars at dinnertime are mostly averaging 150-160 Asim[place]  Postprandial periods: After breakfast:   Blood sugars are variably high and occasionally going up to 200.Marland Kitchen  Average blood sugars as below  After lunch: Blood sugars are generally lower at least 3 incidences of mild hypoglycemia after the meal and average blood sugar lower than before the meal  After  dinner: Blood sugars are usually excellent with usually no hypoglycemia and one episode of hypoglycemia on 4/24 with blood sugar 61   PRE-MEAL Fasting Lunch Dinner Bedtime Overall  Glucose range:       Mean/median:  112  160  159  28  136   POST-MEAL PC Breakfast PC Lunch PC Dinner  Glucose range:     Mean/median:  154  141  135   Symptoms with hypoglycemia: feels weak and shaky Low sugars are treated with Soft drink or glucose tablets      Meals: 3 meals per day.  Usually has small portions. Eggs/bread or one pancake with protein at 7 am; has mid morning snack.  Has hs snack if blood sugar not high Evening meal usually balanced at 5 pm with 1-2 carbohydrates  Physical activity: exercise: Some walking and yardwork         Dietician visit: Most recent: 2004            Wt Readings from Last 3 Encounters:  10/17/18 131 lb (59.4 kg)  07/18/18 132 lb (59.9 kg)  04/30/18 128 lb (75.1 kg)   Complications: are: Mild neuropathy controlled symptomatically  Lab Results  Component Value Date   HGBA1C 7.8 (H) 02/06/2019   HGBA1C 7.5 (H) 10/10/2018   HGBA1C 7.2 (H) 07/11/2018   Lab Results  Component Value Date   MICROALBUR <0.7 07/11/2018   LDLCALC 62 07/11/2018   CREATININE 0.81 02/06/2019    HYPERTENSION:  this has been mild and treated with a half of the 50 mg losartan.  She is checking her blood pressure at home Recent blood pressure at home 118/48  BP Readings from Last 3 Encounters:  10/17/18 132/60  07/18/18 134/60  04/30/18 (!) 179/67    OSTEOPENIA:  She has had osteopenia with T score -2.3 at the hip in 3/09 and was started on Fosamax at that time. This was stopped in 02/2014.   No history of fracture  follow-up bone density in 2016: DualFemur Neck Right 10/03/2015 T score -1.9   History of vitamin D Deficiency, she is taking 50,000 units monthly with adequate levels  Lab Results  Component Value Date   VD25OH 60.16 02/06/2019   VD25OH 64.10  07/11/2018    OTHER active problems: See review of systems   LABS:    No visits with results within 1 Week(s) from this visit.  Latest known visit with results is:  Lab on 02/06/2019  Component Date Value Ref Range Status  . VITD 02/06/2019 60.16  30.00 - 100.00 ng/mL Final  . Iron 02/06/2019 62  42 - 145 ug/dL Final  . Transferrin 02/06/2019 178.0* 212.0 - 360.0 mg/dL Final  . Saturation Ratios 02/06/2019 24.9  20.0 - 50.0 % Final  . WBC 02/06/2019 8.3  4.0 - 10.5 K/uL Final  . RBC 02/06/2019 3.85* 3.87 - 5.11 Mil/uL Final  . Platelets 02/06/2019 269.0  150.0 - 400.0 K/uL Final  . Hemoglobin 02/06/2019 11.9* 12.0 - 15.0 g/dL Final  . HCT 02/06/2019 34.8* 36.0 - 46.0 % Final  . MCV 02/06/2019 90.2  78.0 - 100.0 fl Final  . MCHC 02/06/2019 34.1  30.0 - 36.0 g/dL Final  . RDW 02/06/2019 13.7  11.5 - 15.5 % Final  . Sodium 02/06/2019 136  135 - 145 mEq/L Final  . Potassium 02/06/2019 4.1  3.5 - 5.1 mEq/L Final  . Chloride 02/06/2019 103  96 - 112 mEq/L Final  . CO2 02/06/2019 26  19 - 32 mEq/L Final  . Glucose, Bld 02/06/2019 187* 70 - 99 mg/dL Final  . BUN 02/06/2019 16  6 - 23 mg/dL Final  . Creatinine, Ser 02/06/2019 0.81  0.40 - 1.20 mg/dL Final  . Calcium 02/06/2019 8.8  8.4 - 10.5 mg/dL Final  . GFR 02/06/2019 67.75  >60.00 mL/min Final  . Hgb [name] MFr Bld 02/06/2019 7.8* 4.6 - 6.5 % Final   Glycemic Control Guidelines for People with Diabetes:Non Diabetic:  <6%Goal of Therapy: <7%Additional Action Suggested:  >8%     Allergies as of 02/13/2019      Reactions   Macrodantin [nitrofurantoin Macrocrystal] Swelling   Prednisone    Make real "shaky-like, nervious"   Penicillins Rash   Has patient had a PCN reaction causing immediate rash, facial/tongue/throat swelling, SOB or lightheadedness with hypotension:Yes Has patient had a PCN reaction causing severe rash involving mucus membranes or skin necrosis: No Has patient had a PCN reaction that required hospitalization: No  Has patient had a PCN reaction occurring within the last 10 years:No If all of the above answers are "NO", then may proceed with Cephalosporin use.      Medication List       Accurate as of Feb 13, 2019  9:01 AM. If you have any questions, ask your nurse or doctor.        Alcohol Pads 70 % Pads USE 4 PER DAY   aspirin 81 MG tablet Take 81 mg by mouth daily.   fish oil-omega-3 fatty acids 1000 MG capsule Take 2 g by mouth daily. Two a day   FreeStyle Libre 14 Day Reader Kerrin Mo 1 each by Does not apply route daily. USE DAILY TO CHECK BLOOD SUGAR WITH FREESTYLE LIBRE SENSOR.   FreeStyle Libre 14 Day Sensor Misc APPLY TO ARM EVERY 14 DAYS TO MONITOR BLOOD SUGAR.   gabapentin 100 MG capsule Commonly known as:  NEURONTIN TAKE 3 TO 4 CAPSULES BY MOUTH AT BEDTIME.   insulin aspart 100 UNIT/ML FlexPen Commonly known as:  NovoLOG FlexPen INJECT 3 TO 5 UNITS SUBCUTANEOUSLY BEFORE MEALS AS DIRECTED.   insulin degludec 100 UNIT/ML Sopn FlexTouch Pen Commonly known as:  Tyler Aas FlexTouch Inject 0.13 mLs (13 Units total) into the skin daily. What changed:  how much to take   Insulin Pen Needle 32G X 4 MM Misc Commonly known as:  BD Pen Needle Nano U/F USE AS DIRECTED FIVE TIMES A DAY.   BD Pen Needle Nano U/F 32G X 4 MM Misc Generic drug:  Insulin Pen Needle USE AS DIRECTED FIVE TIMES A DAY.   levothyroxine 75 MCG tablet Commonly known as:  SYNTHROID TAKE 1 TABLET BY MOUTH DAILY BEFORE BREAKFAST, EXCEPT TAKE 1 AND 1/2 TABLETS ON SUNDAY.   losartan 50 MG tablet Commonly known as:  COZAAR TAKE 1 TABLET BY MOUTH DAILY.   lovastatin 40 MG tablet Commonly known as:  MEVACOR TAKE 1 TABLET BY  MOUTH ONCE DAILY WITH A MEAL.   PreserVision AREDS 2 Caps Take by mouth.   vitamin C 100 MG tablet Take 500 mg by mouth daily.   Vitamin D (Ergocalciferol) 1.25 MG (50000 UT) Caps capsule Commonly known as:  DRISDOL TAKE 1 CAPSULE BY MOUTH ONCE A MONTH.       Allergies:   Allergies  Allergen Reactions  . Macrodantin [Nitrofurantoin Macrocrystal] Swelling  . Prednisone     Make real "shaky-like, nervious"  . Penicillins Rash    Has patient had a PCN reaction causing immediate rash, facial/tongue/throat swelling, SOB or lightheadedness with hypotension:Yes Has patient had a PCN reaction causing severe rash involving mucus membranes or skin necrosis: No Has patient had a PCN reaction that required hospitalization: No Has patient had a PCN reaction occurring within the last 10 years:No If all of the above answers are "NO", then may proceed with Cephalosporin use.     Past Medical History:  Diagnosis Date  . Diabetes mellitus without complication [facility])     Past Surgical History:  Procedure Laterality Date  . ABDOMINAL HYSTERECTOMY      Family History  Problem Relation Age of Onset  . Cancer Mother   . Diabetes Brother     Social History:  reports that she has never smoked. She has never used smokeless tobacco. She reports that she does not drink alcohol or use drugs. :  Review of Systems   NEUROPATHY: She has stable control of her symptoms with gabapentin at night  Last colonoscopy showed only hemorrhoids in 7/15 but she has been recommended follow-up in 5 years  ANEMIA: Her hemoglobin is borderline low B12 normal previously and iron level is normal now  Hemoccult was negative in 12/18   Lab Results  Component Value Date   WBC 8.3 02/06/2019   HGB 11.9 (L) 02/06/2019   HCT 34.8 (L) 02/06/2019   MCV 90.2 02/06/2019   PLT 269.0 02/06/2019    HYPOTHYROIDISM: She has had primary hypothyroidism, long-standing . Her TSH levels have been stable with 75 mcg levothyroxine 7.5 tablets per week, has been on this dose since 11/15   Lab Results  Component Value Date   FREET4 1.14 10/10/2018   FREET4 1.01 07/11/2018   FREET4 1.03 07/19/2017   TSH 1.08 10/10/2018   TSH 3.19 07/11/2018   TSH 2.72 04/04/2018     HYPERLIPIDEMIA: The  lipid abnormality consists of elevated LDL,  taking lovastatin with consistently good control.  Lab Results  Component Value Date   CHOL 131 07/11/2018   HDL 50.20 07/11/2018   LDLCALC 62 07/11/2018   TRIG 93.0 07/11/2018   CHOLHDL 3 07/11/2018        Examination:   There were no vitals taken for this visit.  There is no height or weight on file to calculate BMI.    ASSESSMENT/ PLAN:     Diabetes type 2 on insulin   See history of present illness for detailed discussion of current management, blood sugar patterns and problems identified  Her [name] is slightly higher again at 7.8  However considering her age and duration of diabetes as well as labile blood sugars she is still fairly reasonably well controlled  Her CGM was reviewed and discussed in detail She is now tending to have lower overnight blood sugars and to avoid hypoglycemia will need to reduce her TRESIBA down to 11 units instead of 12 With this she will also likely have less rebound from her low normal readings  in the mornings and will cover her breakfast more adequately  She is also having low normal or low readings after lunch at her usual dose of about 4 units base amount on her NovoLog She will reduce this to 3 units She will try to remember to take her insulin with her when going out to eat  Encourage her to stay active also  Hypertension: Her blood pressure is well-controlled with only 25 mg losartan, she will continue to monitor at home  Hypothyroidism :her TSH is previously normal and will be rechecked on the next visit  ANEMIA: Minimal and stable with hemoglobin 11.9 and no current iron deficiency  History of vitamin D deficiency: She is now on a maintenance dose of 2000 units monthly and her level is adequate  Follow-up in 3 months  Total encounter duration on the telephone visit =12 minutes     There are no Patient Instructions on file for this visit.      Elayne Snare 02/13/2019, 9:01 AM

## 2019-03-11 ENCOUNTER — Other Ambulatory Visit: Payer: Self-pay | Admitting: Endocrinology

## 2019-03-27 DIAGNOSIS — H18413 Arcus senilis, bilateral: Secondary | ICD-10-CM | POA: Diagnosis not present

## 2019-03-27 DIAGNOSIS — Z79899 Other long term (current) drug therapy: Secondary | ICD-10-CM | POA: Diagnosis not present

## 2019-03-27 DIAGNOSIS — H353131 Nonexudative age-related macular degeneration, bilateral, early dry stage: Secondary | ICD-10-CM | POA: Diagnosis not present

## 2019-03-27 DIAGNOSIS — H401121 Primary open-angle glaucoma, left eye, mild stage: Secondary | ICD-10-CM | POA: Diagnosis not present

## 2019-03-27 DIAGNOSIS — H40023 Open angle with borderline findings, high risk, bilateral: Secondary | ICD-10-CM | POA: Diagnosis not present

## 2019-03-27 DIAGNOSIS — H04123 Dry eye syndrome of bilateral lacrimal glands: Secondary | ICD-10-CM | POA: Diagnosis not present

## 2019-03-27 DIAGNOSIS — H0102A Squamous blepharitis right eye, upper and lower eyelids: Secondary | ICD-10-CM | POA: Diagnosis not present

## 2019-03-27 DIAGNOSIS — H11153 Pinguecula, bilateral: Secondary | ICD-10-CM | POA: Diagnosis not present

## 2019-03-27 DIAGNOSIS — H5010 Unspecified exotropia: Secondary | ICD-10-CM | POA: Diagnosis not present

## 2019-03-27 DIAGNOSIS — H40011 Open angle with borderline findings, low risk, right eye: Secondary | ICD-10-CM | POA: Diagnosis not present

## 2019-03-27 DIAGNOSIS — H0102B Squamous blepharitis left eye, upper and lower eyelids: Secondary | ICD-10-CM | POA: Diagnosis not present

## 2019-03-27 DIAGNOSIS — E119 Type 2 diabetes mellitus without complications: Secondary | ICD-10-CM | POA: Diagnosis not present

## 2019-04-24 ENCOUNTER — Other Ambulatory Visit: Payer: Self-pay | Admitting: Endocrinology

## 2019-05-08 ENCOUNTER — Other Ambulatory Visit (INDEPENDENT_AMBULATORY_CARE_PROVIDER_SITE_OTHER): Payer: HMO

## 2019-05-08 ENCOUNTER — Other Ambulatory Visit: Payer: Self-pay | Admitting: Family Medicine

## 2019-05-08 ENCOUNTER — Other Ambulatory Visit: Payer: Self-pay

## 2019-05-08 DIAGNOSIS — E1165 Type 2 diabetes mellitus with hyperglycemia: Secondary | ICD-10-CM

## 2019-05-08 DIAGNOSIS — E7800 Pure hypercholesterolemia, unspecified: Secondary | ICD-10-CM | POA: Diagnosis not present

## 2019-05-08 DIAGNOSIS — Z794 Long term (current) use of insulin: Secondary | ICD-10-CM | POA: Diagnosis not present

## 2019-05-08 LAB — URINALYSIS, ROUTINE W REFLEX MICROSCOPIC
Bilirubin Urine: NEGATIVE
Hgb urine dipstick: NEGATIVE
Ketones, ur: NEGATIVE
Leukocytes,Ua: NEGATIVE
Nitrite: NEGATIVE
RBC / HPF: NONE SEEN (ref 0–?)
Specific Gravity, Urine: 1.015 (ref 1.000–1.030)
Total Protein, Urine: NEGATIVE
Urine Glucose: NEGATIVE
Urobilinogen, UA: 0.2 (ref 0.0–1.0)
pH: 6 (ref 5.0–8.0)

## 2019-05-08 LAB — LIPID PANEL
Cholesterol: 135 mg/dL (ref 0–200)
HDL: 51.5 mg/dL (ref >39.00)
LDL Cholesterol: 62 mg/dL (ref 0–99)
NonHDL: 83.62
Total CHOL/HDL Ratio: 3
Triglycerides: 106 mg/dL (ref 0.0–149.0)
VLDL: 21.2 mg/dL (ref 0.0–40.0)

## 2019-05-08 LAB — COMPREHENSIVE METABOLIC PANEL
ALT: 13 U/L (ref 0–35)
AST: 18 U/L (ref 0–37)
Albumin: 4 g/dL (ref 3.5–5.2)
Alkaline Phosphatase: 67 U/L (ref 39–117)
BUN: 17 mg/dL (ref 6–23)
CO2: 25 mEq/L (ref 19–32)
Calcium: 9.3 mg/dL (ref 8.4–10.5)
Chloride: 103 mEq/L (ref 96–112)
Creatinine, Ser: 0.84 mg/dL (ref 0.40–1.20)
GFR: 64.93 mL/min (ref >60.00)
Glucose, Bld: 189 mg/dL — ABNORMAL HIGH (ref 70–99)
Potassium: 4.1 mEq/L (ref 3.5–5.1)
Sodium: 137 mEq/L (ref 135–145)
Total Bilirubin: 0.4 mg/dL (ref 0.2–1.2)
Total Protein: 6.7 g/dL (ref 6.0–8.3)

## 2019-05-08 LAB — HEMOGLOBIN A1C: Hgb A1c MFr Bld: 7.1 % — ABNORMAL HIGH (ref 4.6–6.5)

## 2019-05-08 LAB — MICROALBUMIN / CREATININE URINE RATIO
Creatinine,U: 46.9 mg/dL
Microalb Creat Ratio: 1.5 mg/g (ref 0.0–30.0)
Microalb, Ur: <0.7 mg/dL (ref 0.0–1.9)

## 2019-05-15 ENCOUNTER — Encounter: Payer: Self-pay | Admitting: Endocrinology

## 2019-05-15 ENCOUNTER — Other Ambulatory Visit: Payer: Self-pay

## 2019-05-15 ENCOUNTER — Ambulatory Visit (INDEPENDENT_AMBULATORY_CARE_PROVIDER_SITE_OTHER): Payer: HMO | Admitting: Endocrinology

## 2019-05-15 VITALS — BP 140/60 | HR 72 | Ht 59.0 in | Wt 128.8 lb

## 2019-05-15 DIAGNOSIS — H0102A Squamous blepharitis right eye, upper and lower eyelids: Secondary | ICD-10-CM | POA: Diagnosis not present

## 2019-05-15 DIAGNOSIS — H401121 Primary open-angle glaucoma, left eye, mild stage: Secondary | ICD-10-CM | POA: Diagnosis not present

## 2019-05-15 DIAGNOSIS — Z794 Long term (current) use of insulin: Secondary | ICD-10-CM

## 2019-05-15 DIAGNOSIS — H40011 Open angle with borderline findings, low risk, right eye: Secondary | ICD-10-CM | POA: Diagnosis not present

## 2019-05-15 DIAGNOSIS — E1165 Type 2 diabetes mellitus with hyperglycemia: Secondary | ICD-10-CM | POA: Diagnosis not present

## 2019-05-15 DIAGNOSIS — E119 Type 2 diabetes mellitus without complications: Secondary | ICD-10-CM | POA: Diagnosis not present

## 2019-05-15 DIAGNOSIS — H18413 Arcus senilis, bilateral: Secondary | ICD-10-CM | POA: Diagnosis not present

## 2019-05-15 DIAGNOSIS — H0102B Squamous blepharitis left eye, upper and lower eyelids: Secondary | ICD-10-CM | POA: Diagnosis not present

## 2019-05-15 DIAGNOSIS — E063 Autoimmune thyroiditis: Secondary | ICD-10-CM

## 2019-05-15 DIAGNOSIS — D508 Other iron deficiency anemias: Secondary | ICD-10-CM | POA: Diagnosis not present

## 2019-05-15 DIAGNOSIS — H04123 Dry eye syndrome of bilateral lacrimal glands: Secondary | ICD-10-CM | POA: Diagnosis not present

## 2019-05-15 DIAGNOSIS — H11153 Pinguecula, bilateral: Secondary | ICD-10-CM | POA: Diagnosis not present

## 2019-05-15 DIAGNOSIS — H353131 Nonexudative age-related macular degeneration, bilateral, early dry stage: Secondary | ICD-10-CM | POA: Diagnosis not present

## 2019-05-15 NOTE — Progress Notes (Signed)
Patient ID: Jennifer Bailey, female   DOB: [birth year], 84 y.o.   MRN: [id number]   Chief complaint: follow up    History of Present Illness     Type 2 DIABETES MELITUS, date of diagnosis: 2003  She has been on insulin for several years but continues to have labile blood sugars despite using basal bolus regimen  She is very compliant with her insulin at mealtimes  Is taking small amounts of insulin and is not on any oral hypoglycemics She was changed from Levemir to [place] insulin in 1/17 because of relatively inconsistent control   RECENT history:   Insulin regimen: Tresiba 11 units in a.m.;   mealtime NovoLog coverage: At breakfast:  3 units, 3-4  lunch and 4 at supper     Current blood sugar trends from freestyle [employer], management and problems identified:  Her [name] is the best in 3 years at 7.1, previously 7.8 on 2 occasions Daily management, problems identified and abnormal patterns are discussed in detail in the CGM analysis before She was told to reduce her lunchtime coverage by 1 unit and she has done so Also taking 12 units of Tresiba instead of 11 which has reduced any low normal or low sugars early morning  Monitors blood glucose:  Usually up to 4 times a day.    Glucometer:  Freestyle libre         CONTINUOUS GLUCOSE MONITORING RECORD INTERPRETATION    Dates of Recording: 7/28 through 8/10  Sensor description: Freestyle libre  Results statistics:   CGM use % of time  97  Average and SD  34, GV 34  Time in range    77    % was 80  % Time Above 180  18  % Time above 250   % Time Below target  2    Glycemic patterns summary: She feels that her blood sugars from the freestyle libre are accurate Blood sugars are within the target range majority of the time with only some increase around breakfast time and they midafternoon Hypoglycemia has been minimal and only transiently around 6 PM and once after lunch  Hyperglycemic episodes are  occurring most often after lunch This is more so if she has a low normal reading before lunch Has only a couple of significantly high readings going up after breakfast and probably twice after dinner  Hypoglycemic episodes occurred only about twice, once after lunch and once around dinnertime on the same day Lowest glucose 60  Overnight periods: Blood sugars are generally declining gradually between midnight and 5 AM, about 150 average at midnight and about 115 at waking up time  Preprandial periods: BREAKFAST: Blood sugars appear to be rising between 5-7 AM before breakfast and now then averaging up to 150 Blood sugars are excellent at lunch at about 125 average Blood sugars are on an average higher at dinnertime with more variability and about 165 average  Postprandial periods:   After breakfast: Blood sugars are rising modestly but not clear if this is before the meal most of the time  After lunch:   As above her blood sugars are variably high with average 172 After dinner: Blood sugars are relatively flat after the meal with only a couple of spikes which are modest and average 159   PRE-MEAL Fasting Lunch Dinner Bedtime Overall  Glucose range:       Mean/median:  124  125  165   144   POST-MEAL PC Breakfast  PC Lunch PC Dinner  Glucose range:     Mean/median:  151  172  142      Symptoms with hypoglycemia: feels weak and shaky Low sugars are treated with Soft drink or glucose tablets      Meals: 3 meals per day.  Usually has small portions. Eggs/bread or one pancake with protein at 7 am; has mid morning snack.  Has hs snack if blood sugar not high Evening meal usually balanced at 5 pm with 1-2 carbohydrates  Physical activity: exercise: Some walking and yardwork         Dietician visit: Most recent: 2004            Wt Readings from Last 3 Encounters:  05/15/19 128 lb 12.8 oz (58.4 kg)  10/17/18 131 lb (59.4 kg)  07/18/18 132 lb (67.8 kg)   Complications: are: Mild  neuropathy controlled symptomatically  Lab Results  Component Value Date   HGBA1C 7.1 (H) 05/08/2019   HGBA1C 7.8 (H) 02/06/2019   HGBA1C 7.5 (H) 10/10/2018   Lab Results  Component Value Date   MICROALBUR <0.7 05/08/2019   LDLCALC 62 05/08/2019   CREATININE 0.84 05/08/2019    HYPERTENSION:  this has been mild and treated with 50 mg losartan.  She is checking her blood pressure at home and she thinks it is sometimes below 938 systolic but has not had her meter compared to the office reading No lightheadedness   BP Readings from Last 3 Encounters:  05/15/19 140/60  10/17/18 132/60  07/18/18 134/60    OSTEOPENIA:  She has had osteopenia with T score -2.3 at the hip in 3/09 and was started on Fosamax at that time. This was stopped in 02/2014.   No history of fracture  follow-up bone density in 2016: DualFemur Neck Right 10/03/2015 T score -1.9   History of vitamin D Deficiency, she is taking 50,000 units monthly with adequate levels  Lab Results  Component Value Date   VD25OH 60.16 02/06/2019   VD25OH 64.10 07/11/2018    OTHER active problems: See review of systems   LABS:    No visits with results within 1 Week(s) from this visit.  Latest known visit with results is:  Lab on 05/08/2019  Component Date Value Ref Range Status  . Cholesterol 05/08/2019 135  0 - 200 mg/dL Final   ATP III Classification       Desirable:  < 200 mg/dL               Borderline High:  200 - 239 mg/dL          High:  > = 240 mg/dL  . Triglycerides 05/08/2019 106.0  0.0 - 149.0 mg/dL Final   Normal:  <150 mg/dLBorderline High:  150 - 199 mg/dL  . HDL 05/08/2019 51.50  >39.00 mg/dL Final  . VLDL 05/08/2019 21.2  0.0 - 40.0 mg/dL Final  . LDL Cholesterol 05/08/2019 62  0 - 99 mg/dL Final  . Total CHOL/HDL Ratio 05/08/2019 3   Final                  Men          Women1/2 Average Risk     3.4          3.3Average Risk          5.0          4.42X Average Risk          9.6  7.13X Average Risk          15.0          11.0                      . NonHDL 05/08/2019 83.62   Final   NOTE:  Non-HDL goal should be 30 mg/dL higher than patient's LDL goal (i.e. LDL goal of < 70 mg/dL, would have non-HDL goal of < 100 mg/dL)  . Sodium 05/08/2019 137  135 - 145 mEq/L Final  . Potassium 05/08/2019 4.1  3.5 - 5.1 mEq/L Final  . Chloride 05/08/2019 103  96 - 112 mEq/L Final  . CO2 05/08/2019 25  19 - 32 mEq/L Final  . Glucose, Bld 05/08/2019 189* 70 - 99 mg/dL Final  . BUN 05/08/2019 17  6 - 23 mg/dL Final  . Creatinine, Ser 05/08/2019 0.84  0.40 - 1.20 mg/dL Final  . Total Bilirubin 05/08/2019 0.4  0.2 - 1.2 mg/dL Final  . Alkaline Phosphatase 05/08/2019 67  39 - 117 U/L Final  . AST 05/08/2019 18  0 - 37 U/L Final  . ALT 05/08/2019 13  0 - 35 U/L Final  . Total Protein 05/08/2019 6.7  6.0 - 8.3 g/dL Final  . Albumin 05/08/2019 4.0  3.5 - 5.2 g/dL Final  . Calcium 05/08/2019 9.3  8.4 - 10.5 mg/dL Final  . GFR 05/08/2019 64.93  >60.00 mL/min Final  . Hgb [name] MFr Bld 05/08/2019 7.1* 4.6 - 6.5 % Final   Glycemic Control Guidelines for People with Diabetes:Non Diabetic:  <6%Goal of Therapy: <7%Additional Action Suggested:  >8%   . Color, Urine 05/08/2019 YELLOW  Yellow;Lt. Yellow;Straw;Dark Yellow;Amber;Green;Red;Brown Final  . APPearance 05/08/2019 CLEAR  Clear;Turbid;Slightly Cloudy;Cloudy Final  . Specific Gravity, Urine 05/08/2019 1.015  1.000 - 1.030 Final  . pH 05/08/2019 6.0  5.0 - 8.0 Final  . Total Protein, Urine 05/08/2019 NEGATIVE  Negative Final  . Urine Glucose 05/08/2019 NEGATIVE  Negative Final  . Ketones, ur 05/08/2019 NEGATIVE  Negative Final  . Bilirubin Urine 05/08/2019 NEGATIVE  Negative Final  . Hgb urine dipstick 05/08/2019 NEGATIVE  Negative Final  . Urobilinogen, UA 05/08/2019 0.2  0.0 - 1.0 Final  . Leukocytes,Ua 05/08/2019 NEGATIVE  Negative Final  . Nitrite 05/08/2019 NEGATIVE  Negative Final  . WBC, UA 05/08/2019 0-2/hpf  0-2/hpf Final  . RBC /  HPF 05/08/2019 none seen  0-2/hpf Final  . Squamous Epithelial / LPF 05/08/2019 Rare(0-4/hpf)  Rare(0-4/hpf) Final  . Microalb, Ur 05/08/2019 <0.7  0.0 - 1.9 mg/dL Final  . Creatinine,U 05/08/2019 46.9  mg/dL Final  . Microalb Creat Ratio 05/08/2019 1.5  0.0 - 30.0 mg/g Final    Allergies as of 05/15/2019      Reactions   Macrodantin [nitrofurantoin Macrocrystal] Swelling   Prednisone    Make real "shaky-like, nervious"   Penicillins Rash   Has patient had a PCN reaction causing immediate rash, facial/tongue/throat swelling, SOB or lightheadedness with hypotension:Yes Has patient had a PCN reaction causing severe rash involving mucus membranes or skin necrosis: No Has patient had a PCN reaction that required hospitalization: No Has patient had a PCN reaction occurring within the last 10 years:No If all of the above answers are "NO", then may proceed with Cephalosporin use.      Medication List       Accurate as of May 15, 2019  3:00 PM. If you have any questions, ask your nurse or doctor.  Alcohol Pads 70 % Pads USE 4 PER DAY   aspirin 81 MG tablet Take 81 mg by mouth daily.   BD Pen Needle Nano U/F 32G X 4 MM Misc Generic drug: Insulin Pen Needle USE AS DIRECTED FIVE TIMES A DAY.   fish oil-omega-3 fatty acids 1000 MG capsule Take 2 g by mouth daily. Two a day   FreeStyle Libre 14 Day Reader Kerrin Mo 1 each by Does not apply route daily. USE DAILY TO CHECK BLOOD SUGAR WITH FREESTYLE LIBRE SENSOR.   FreeStyle Libre 14 Day Sensor Misc APPLY TO ARM EVERY 14 DAYS TO MONITOR BLOOD SUGAR.   gabapentin 100 MG capsule Commonly known as: NEURONTIN TAKE 3 TO 4 CAPSULES BY MOUTH AT BEDTIME.   insulin degludec 100 UNIT/ML Sopn FlexTouch Pen Commonly known as: [employer] Aas FlexTouch Inject 0.13 mLs (13 Units total) into the skin daily. What changed: how much to take   levothyroxine 75 MCG tablet Commonly known as: SYNTHROID TAKE 1 TABLET BY MOUTH DAILY BEFORE BREAKFAST,  EXCEPT TAKE 1 AND 1/2 TABLETS ON SUNDAY.   losartan 50 MG tablet Commonly known as: COZAAR TAKE 1 TABLET BY MOUTH DAILY.   lovastatin 40 MG tablet Commonly known as: MEVACOR TAKE 1 TABLET BY MOUTH ONCE DAILY WITH A MEAL.   NovoLOG FlexPen 100 UNIT/ML FlexPen Generic drug: insulin aspart INJECT 3 TO 5 UNITS SUBCUTANEOUSLY BEFORE MEALS AS DIRECTED.   PreserVision AREDS 2 Caps Take by mouth.   vitamin C 100 MG tablet Take 500 mg by mouth daily.   Vitamin D (Ergocalciferol) 1.25 MG (50000 UT) Caps capsule Commonly known as: DRISDOL TAKE 1 CAPSULE BY MOUTH ONCE A MONTH.       Allergies:  Allergies  Allergen Reactions  . Macrodantin [Nitrofurantoin Macrocrystal] Swelling  . Prednisone     Make real "shaky-like, nervious"  . Penicillins Rash    Has patient had a PCN reaction causing immediate rash, facial/tongue/throat swelling, SOB or lightheadedness with hypotension:Yes Has patient had a PCN reaction causing severe rash involving mucus membranes or skin necrosis: No Has patient had a PCN reaction that required hospitalization: No Has patient had a PCN reaction occurring within the last 10 years:No If all of the above answers are "NO", then may proceed with Cephalosporin use.     Past Medical History:  Diagnosis Date  . Diabetes mellitus without complication [facility])     Past Surgical History:  Procedure Laterality Date  . ABDOMINAL HYSTERECTOMY      Family History  Problem Relation Age of Onset  . Cancer Mother   . Diabetes Brother     Social History:  reports that she has never smoked. She has never used smokeless tobacco. She reports that she does not drink alcohol or use drugs. :  Review of Systems   NEUROPATHY: She has stable control of her symptoms with gabapentin at night  Last colonoscopy showed only hemorrhoids in 7/15 but she has been recommended follow-up in 5 years  ANEMIA: Her hemoglobin is borderline low B12 normal previously and iron level is  normal now  Hemoccult was negative in 12/18   Lab Results  Component Value Date   WBC 8.3 02/06/2019   HGB 11.9 (L) 02/06/2019   HCT 34.8 (L) 02/06/2019   MCV 90.2 02/06/2019   PLT 269.0 02/06/2019    HYPOTHYROIDISM: She has had primary hypothyroidism, long-standing . Her TSH levels have been stable with 75 mcg levothyroxine 7.5 tablets per week, has been on this dose since 11/15  Lab Results  Component Value Date   FREET4 1.14 10/10/2018   FREET4 1.01 07/11/2018   FREET4 1.03 07/19/2017   TSH 1.08 10/10/2018   TSH 3.19 07/11/2018   TSH 2.72 04/04/2018     HYPERLIPIDEMIA: The lipid abnormality consists of elevated LDL,  taking lovastatin with consistently good control.  Lab Results  Component Value Date   CHOL 135 05/08/2019   HDL 51.50 05/08/2019   LDLCALC 62 05/08/2019   TRIG 106.0 05/08/2019   CHOLHDL 3 05/08/2019        Examination:   BP 140/60 (BP Location: Right Arm, Patient Position: Sitting, Cuff Size: Normal)   Pulse 72   Ht 4\' 11"  (1.499 m)   Wt 128 lb 12.8 oz (58.4 kg)   SpO2 97%   BMI 26.01 kg/m   Body mass index is 26.01 kg/m.    ASSESSMENT/ PLAN:     Diabetes type 2 on insulin   See history of present illness for detailed discussion of current management, blood sugar patterns and problems identified  Her [name] is better at 7.1, previously 7.8, best in 3 years This is without excessive hypoglycemia  She has benefited from using the freestyle libre continuous glucose monitoring and able to keep up with her blood sugars better Also is able to avoid hypoglycemia especially recently Discussed how her blood sugar is fluctuating at various meals and hypoglycemia where she is having hypoglycemia and hyperglycemia  With reducing her lunchtime coverage she has less hypoglycemia in the afternoon but has a couple of episodes of high readings, a couple of times this may be related to overtreating low normal sugars prior to the meal Only rarely may  miss her insulin dose at mealtimes  She is doing very well with exercise program at home which is helping but not contributing to hypoglycemia  Her insulin doses will continue the same Encouraged her to adjust her mealtime dose 1 or 2 units based on what she is planning to eat Also avoid overtreating hypoglycemia  Hypertension: Her blood pressure is well-controlled She will continue 50 mg of losartan and bring her monitor for review and comparison   Hypothyroidism :her TSH is previously normal and will be rechecked on the next visit  ANEMIA: We will recheck on the next visit  General exam: This will be deferred to her PCP, she will try to establish someone close by    Follow-up in 3 months  Total visit time for evaluation and management of multiple problems and counseling =25 minutes     Patient Instructions  Bring BP meter     Jayvian Escoe 05/15/2019, 3:00 PM

## 2019-05-15 NOTE — Patient Instructions (Signed)
Bring BP meter

## 2019-06-19 ENCOUNTER — Other Ambulatory Visit: Payer: Self-pay

## 2019-06-19 ENCOUNTER — Other Ambulatory Visit: Payer: Self-pay | Admitting: Endocrinology

## 2019-06-19 NOTE — Telephone Encounter (Signed)
Would you like to refill or refer to PCP?

## 2019-06-19 NOTE — Patient Outreach (Signed)
  [facility]) Care Management Chronic Special Needs Program  06/19/2019  Name: Jennifer Bailey DOB: 08/17/1935  MRN: [id number]  [name] is enrolled in a chronic special needs plan for Diabetes. Reviewed and updated care plan.  Subjective: Client reports she has followed up with providers. She states seeing Endocrinologist last month. [name] 7.1. she reports she continues to use [employer] without any difficulty. Client denies any transportation issues to provider visits. Next scheduled visit with endocrinologist November 10th 2020. Client states she has been monitoring blood pressure and it has been less than 140/90.  Goals Addressed            This Visit's Progress   . COMPLETED: Client understands the importance of follow-up with providers by attending scheduled visits       Attends scheduled visits.    . COMPLETED: Client will use Assistive Devices as needed and verbalize understanding of device use       Denies any questions or concerns    . Client will verbalize knowledge of self management of Hypertension as evidences by BP reading of 140/90 or less; or as defined by provider   On track   . COMPLETED: Maintain timely refills of diabetic medication as prescribed within the year .   On track    Denies any concerns or issues.    . COMPLETED: Obtain annual  Lipid Profile, LDL-C       Per chart 05/08/2019    . COMPLETED: Obtain Annual Eye (retinal)  Exam        Per client 05/08/2019    . Obtain Annual Foot Exam   On track   . COMPLETED: Obtain annual screen for micro albuminuria (urine) , nephropathy (kidney problems)       Done 05/08/2019    . COMPLETED: Obtain Hemoglobin [name] at least 2 times per year       Done 10/10/2018; 02/06/2019; 05/08/2019    . COMPLETED: Visit Primary Care Provider or Endocrinologist at least 2 times per year        Endocrinologist visit 10/17/3028; 02/13/2019; 05/15/2019      Client reports in-network provider primary care  visit scheduled with Dr. Mellody Dance on October 1st.  RNCM reinforced COVID-19 precautions; encouraged client to call 24  Hour nurse advice line as needed. Also encouraged client to call healthcare concierge as needed for benefits questions. RNCM encouraged client to call RNCM as needed.  Plan: RNCM will send updated care plan to client. RNCM update primary care. Chronic care management coordinator will outreach within 6 months.   Thea Silversmith, RN, MSN, [place] [phone]

## 2019-06-27 ENCOUNTER — Ambulatory Visit: Payer: HMO | Admitting: Family Medicine

## 2019-07-03 ENCOUNTER — Other Ambulatory Visit: Payer: Self-pay | Admitting: Endocrinology

## 2019-07-06 ENCOUNTER — Other Ambulatory Visit: Payer: Self-pay

## 2019-07-06 ENCOUNTER — Ambulatory Visit (INDEPENDENT_AMBULATORY_CARE_PROVIDER_SITE_OTHER): Payer: HMO | Admitting: Family Medicine

## 2019-07-06 ENCOUNTER — Encounter: Payer: Self-pay | Admitting: Family Medicine

## 2019-07-06 VITALS — BP 132/69 | HR 67 | Ht 58.25 in | Wt 128.0 lb

## 2019-07-06 DIAGNOSIS — E1169 Type 2 diabetes mellitus with other specified complication: Secondary | ICD-10-CM

## 2019-07-06 DIAGNOSIS — E1159 Type 2 diabetes mellitus with other circulatory complications: Secondary | ICD-10-CM

## 2019-07-06 DIAGNOSIS — Z7689 Persons encountering health services in other specified circumstances: Secondary | ICD-10-CM

## 2019-07-06 DIAGNOSIS — E063 Autoimmune thyroiditis: Secondary | ICD-10-CM

## 2019-07-06 DIAGNOSIS — E1165 Type 2 diabetes mellitus with hyperglycemia: Secondary | ICD-10-CM | POA: Diagnosis not present

## 2019-07-06 DIAGNOSIS — E1142 Type 2 diabetes mellitus with diabetic polyneuropathy: Secondary | ICD-10-CM

## 2019-07-06 DIAGNOSIS — I10 Essential (primary) hypertension: Secondary | ICD-10-CM

## 2019-07-06 DIAGNOSIS — R251 Tremor, unspecified: Secondary | ICD-10-CM

## 2019-07-06 DIAGNOSIS — Z634 Disappearance and death of family member: Secondary | ICD-10-CM

## 2019-07-06 DIAGNOSIS — Z800 Family history of malignant neoplasm of digestive organs: Secondary | ICD-10-CM | POA: Diagnosis not present

## 2019-07-06 DIAGNOSIS — Z833 Family history of diabetes mellitus: Secondary | ICD-10-CM

## 2019-07-06 DIAGNOSIS — Z23 Encounter for immunization: Secondary | ICD-10-CM

## 2019-07-06 DIAGNOSIS — E785 Hyperlipidemia, unspecified: Secondary | ICD-10-CM

## 2019-07-06 DIAGNOSIS — Z719 Counseling, unspecified: Secondary | ICD-10-CM | POA: Diagnosis not present

## 2019-07-06 NOTE — Progress Notes (Signed)
New patient office visit note:  Impression and Recommendations:    1. Encounter to establish care with new doctor   2. Uncontrolled type 2 diabetes mellitus with hyperglycemia (Jennifer Bailey)   3. Diabetic polyneuropathy associated with type 2 diabetes mellitus (Jennifer Bailey)   4. Hypertension associated with diabetes (Jennifer Bailey)   5. Hyperlipidemia associated with type 2 diabetes mellitus ([place]ennifer Bailey)   6. Acquired autoimmune hypothyroidism   7. Need for vaccination   8. Health education/counseling   9. Tremulousness of voice     Encounter to Establish Care with New Doctor - Extensive discussion held with patient regarding establishing as a new patient.  Discussed policies and practices here at the clinic, and answered all questions about care team and health management during appointment.  - Discussed need for patient to continue to obtain management and screenings with all established specialists.  Educated patient at length about the critical importance of keeping health maintenance up to date.  - Discussed that at age 29, with multiple medical problems, her health status may change very quickly, and she needs to return to the clinic three times per year or more often if deemed appropriate.  - Participated in lengthy conversation and all questions were answered.  - Last lab work drawn included: cholesterol, urine micro albumin, CMP, [name], all drawn one month ago.   - Need for vaccination - Flu Vaccine QUAD High Dose(Fluad)    Tremulous Speaking Voice - Patient understands my limitations to knowing if this is new vs chronic and/ or pathological or not since is the first time I am meeting her - Advised patient to speak with Dr. Dwyane Dee about this since she has been established with him for many many years and he would be the best at determining if this was a deviation from her chronic baseline - Patient understands the importance of assessing whether or not this is a new change or a chronic issue.  -Explained to patient that this can be spasms of the different muscles used during speech such as spasmodic dysphonia versus other more serious neurological conditions such as Parkinson's disease, ALS etc. -Discussed with patient if this is new, she will need either ENT evaluation to look for vocal cord paralysis etc. and /or  neurology evaluation to rule out neuromuscular disorders such as ALS, Parkinson's etc. - Patient knows to let Jennifer Bailey know at once if the shaking in her voice or body worsens.  - Will continue to monitor and evaluate further PRN.   Uncontrolled type 2 diabetes mellitus with hyperglycemia - Followed by Dr. Dwyane Dee. - Last [name] was 7.1 one month ago, which is controlled.  - Per pt, has been managed on insulin for years. - Takes 11 units of Tresiba in the morning. - Takes NovoLog at mealtimes, from 3-5 units.  - Advised patient to continue prudent eating and exercise habits. - Discussed importance of avoiding sugary beverages if possible.  - Kidney function was 0.84 last check. - CMP WNL.  - Will continue to monitor alongside Dr. Dwyane Dee.  Diabetic polyneuropathy associated with type 2 diabetes mellitus ([place]) - Stable at this time per patient. - Managed on gabapentin. - Will continue to monitor.  Hypertension associated with diabetes - Stable at this time - No changes made to management. - Continue treatment as established.  - Ambulatory BP monitoring encouraged.  - Will continue to monitor.  Hyperlipidemia associated with type 2 diabetes mellitus - Managed on statin. - No changes made to management today  as at goal. - Continue treatment as established.  - Patient's cholesterol last checked: - LDL = 62 - Triglyceride = 106 - HDL = 51  - Will continue to monitor.  Acquired autoimmune hypothyroidism - Stable at this time. - Continue management as established. - No changes made to treatment plan today. - Will continue to monitor.  Health Counseling &  Preventative Health Maintenance - Advised patient to continue working toward exercising to improve overall mental, physical, and emotional health.    - Reviewed the "spokes of the wheel" of mood and health management.  Stressed the importance of ongoing prudent habits, including regular exercise, appropriate sleep hygiene, healthful dietary habits, and prayer/meditation to relax.  - Encouraged patient to engage in daily physical activity as tolerated, especially a formal exercise routine.  Recommended that the patient eventually strive for at least 150 minutes of moderate cardiovascular activity per week according to guidelines established by the [facility].   - Healthy dietary habits encouraged, including low-carb, and high amounts of lean protein in diet.   - Patient should also consume adequate amounts of water.  - Health counseling performed.  All questions answered.   Education and routine counseling performed. Handouts provided.  Recommendations - Return in 4-6 months, acute concerns PRN.   Orders Placed This Encounter  Procedures  . Flu Vaccine QUAD High Dose(Fluad)   Gross side effects, risk and benefits, and alternatives of medications discussed with patient.  Patient is aware that all medications have potential side effects and we are unable to predict every side effect or drug-drug interaction that may occur.  Expresses verbal understanding and consents to current therapy plan and treatment regimen.  Return for 4-6 months, acute concerns PRN.Marland Kitchen  Please see AVS handed out to patient at the end of our visit for further patient instructions/ counseling done pertaining to today's office visit.    Note:  This document was prepared using Dragon voice recognition software and may include unintentional dictation errors.  This document serves as a record of services personally performed by Mellody Dance, DO. It was created on her behalf by Toni Amend, a trained medical scribe. The  creation of this record is based on the scribe's personal observations and the provider's statements to them.   I have reviewed the above medical documentation for accuracy and completeness and I concur.  Mellody Dance, DO 07/09/2019 4:15 PM       ---------------------------------------------------------------------------------------------------------------------------------------------------------------------------------------------    Subjective:    Chief complaint:   Chief Complaint  Patient presents with  . Establish Care     HPI: MORNING OYEN is a pleasant 84 y.o. female who presents to [place] at [facility] today to review their medical history with me and establish care.   I asked the patient to review their chronic problem list with me to ensure everything was updated and accurate.    All recent office visits with other providers, any medical records that patient brought in etc  - I reviewed today.     We asked pt to get Jennifer Bailey their medical records from [facility] providers/ specialists that they had seen within the past 3-5 years- if they are in private practice and/or do not work for [employer], [facility], [place], [place] or [employer] owned practice.  Told them to call their specialists to clarify this if they are not sure.    Reason for establishing care: notes that her insurance is what's pushing her to go find a general doctor.  Dr. Dwyane Dee is going to continue to see the patient for her diabetes.  Has been seeing Dr. Dwyane Dee regularly.  She follows up with Dr. Dwyane Dee every four months.  Saw Dr. Arelia Sneddon in the past, but states now that her insurance won't take him.  Last saw Dr. Arelia Sneddon last year.  Social History Used to work in Ambulance person, "at [employer]."  Lives with daughter Izora Gala. Izora Gala is aged 58. Notes Izora Gala "works some."  Has three daughters total. One of her other daughters is named Set designer; lives in [place].  Clarene Critchley lives off of [address] [place]. Has one grandson with two kids. And one granddaughter. Three great-grandchildren total.  Was married for 60 years. He passed way 5 years ago in March.  States she exercises. Says "we have this tape that's 35 minutes long." Says "it's like kickboxing." Notes uses her arms and legs and everything. Used to do the tape "about every day of the week."  Says she's "exercised off and on straight for probably 30 years." Notes when she was told to improve her diet and exercise, "that got me started."  Tobacco Use Never smoker.  Alcohol Use Does not drink.  Drinks a 16 oz bottle of Dr. Malachi Bonds / Coke per day.  Family History Brother with diabetes. Brother has cancer; she thinks colon cancer.  Mom had stomach cancer.  Past Medical History  - Tremulous Voice While Speaking Thinks her voice breaking up is new and states no one has spoken to her about it.  She is unsure if it is a chronic change or not.  - Diabetes Mellitus with Peripheral Neuropathy Diagnosed with diabetes in her mid 73's; states "I've had it about 16 years."  Notes managed on blood pressure and cholesterol medications to protect her system with diabetes.  Notes numbness in her feet due to diabetes; she treats this with gabapentin.  - Acquired Autoimmune Hypothyroidism States she's had this for years; no concerns on current management.  - GERD States occasionally takes "medicine for my stomach," Protonix.  Notes "I don't stay on it." Says she started because sometimes her stomach doesn't feel right when she goes to lay down at night.  - Precancerous Polyps Removed Reports history of precancerous polyps on colonoscopy.   Wt Readings from Last 3 Encounters:  07/06/19 128 lb (58.1 kg)  05/15/19 128 lb 12.8 oz (58.4 kg)  10/17/18 131 lb (59.4 kg)   BP Readings from Last 3 Encounters:  07/06/19 132/69  05/15/19 140/60  10/17/18 132/60   Pulse Readings from Last 3 Encounters:  07/06/19 67   05/15/19 72  10/17/18 81   BMI Readings from Last 3 Encounters:  07/06/19 26.52 kg/m  05/15/19 26.01 kg/m  10/17/18 26.46 kg/m    Patient Care Team    Relationship Specialty Notifications Start End  Mellody Dance, DO PCP - General Family Medicine  07/06/19   Luretha Rued, [place] [facility]  Admissions 10/07/18    Comment: [facility] chronic care management coordinator  Elayne Snare, MD Consulting Physician Endocrinology  07/06/19     Patient Active Problem List   Diagnosis Date Noted  . Type II diabetes mellitus, uncontrolled ([place]) 08/20/2014    Priority: High  . Diabetic polyneuropathy ([place]) 08/14/2013    Priority: High  . Hypertension associated with diabetes ([place]) 06/14/2013    Priority: High  . Hyperlipidemia associated with type 2 diabetes mellitus ([place]) 06/14/2013    Priority: High  . Acquired autoimmune hypothyroidism 08/20/2014  Priority: Medium  . Osteoporosis 11/20/2013    Priority: Low       As reported by pt:  Past Medical History:  Diagnosis Date  . Diabetes mellitus without complication ([place])   . Thyroid disease      Past Surgical History:  Procedure Laterality Date  . ABDOMINAL HYSTERECTOMY    . CHOLECYSTECTOMY    . COLON SURGERY       Family History  Problem Relation Age of Onset  . Cancer Mother   . Diabetes Brother      Social History   Substance and Sexual Activity  Drug Use Never     Social History   Substance and Sexual Activity  Alcohol Use Never  . Frequency: Never     Social History   Tobacco Use  Smoking Status Never Smoker  Smokeless Tobacco Never Used     Current Meds  Medication Sig  . Alcohol Swabs (ALCOHOL PADS) 70 % PADS USE 4 PER DAY  . Ascorbic Acid (VITAMIN C) 100 MG tablet Take 500 mg by mouth daily.   Marland Kitchen aspirin 81 MG tablet Take 81 mg by mouth daily.  . BD PEN NEEDLE NANO U/F 32G X 4 MM MISC USE AS DIRECTED FIVE TIMES A DAY.  Marland Kitchen Continuous Blood Gluc Receiver  (FREESTYLE LIBRE 14 DAY READER) DEVI 1 each by Does not apply route daily. USE DAILY TO CHECK BLOOD SUGAR WITH FREESTYLE LIBRE SENSOR.  . Continuous Blood Gluc Sensor (FREESTYLE LIBRE 14 DAY SENSOR) MISC APPLY TO ARM EVERY 14 DAYS TO MONITOR BLOOD SUGAR.  . cycloSPORINE (RESTASIS) 0.05 % ophthalmic emulsion 1 drop 2 (two) times daily.  . fish oil-omega-3 fatty acids 1000 MG capsule Take 2 g by mouth daily. Two a day  . gabapentin (NEURONTIN) 100 MG capsule TAKE 3 TO 4 CAPSULES BY MOUTH AT BEDTIME.  . insulin degludec (TRESIBA FLEXTOUCH) 100 UNIT/ML SOPN FlexTouch Pen Inject 0.13 mLs (13 Units total) into the skin daily. (Patient taking differently: Inject 11 Units into the skin daily. )  . levothyroxine (SYNTHROID, LEVOTHROID) 75 MCG tablet TAKE 1 TABLET BY MOUTH DAILY BEFORE BREAKFAST, EXCEPT TAKE 1 AND 1/2 TABLETS ON SUNDAY.  . LORazepam (ATIVAN) 0.5 MG tablet TAKE 1 TABLET BY MOUTH 2 TIMES DAILY AS NEEDED FOR ANXIETY.  Marland Kitchen losartan (COZAAR) 50 MG tablet TAKE 1 TABLET BY MOUTH DAILY.  Marland Kitchen lovastatin (MEVACOR) 40 MG tablet TAKE 1 TABLET BY MOUTH ONCE DAILY WITH A MEAL.  . Multiple Vitamins-Minerals (PRESERVISION AREDS 2) CAPS Take by mouth.  Marland Kitchen NOVOLOG FLEXPEN 100 UNIT/ML FlexPen INJECT 3 TO 5 UNITS SUBCUTANEOUSLY BEFORE MEALS AS DIRECTED.  Marland Kitchen pantoprazole (PROTONIX) 40 MG tablet Take 40 mg by mouth 2 (two) times daily.  . sucralfate (CARAFATE) 1 g tablet Take 1 g by mouth 2 (two) times daily as needed.  . Vitamin D, Ergocalciferol, (DRISDOL) 50000 units CAPS capsule TAKE 1 CAPSULE BY MOUTH ONCE A MONTH.    Allergies: Macrodantin [nitrofurantoin macrocrystal], Prednisone, and Penicillins   Review of Systems  Constitutional: Negative for chills, diaphoresis, fever, malaise/fatigue and weight loss.  HENT: Negative for congestion, sore throat and tinnitus.   Eyes: Negative for blurred vision, double vision and photophobia.  Respiratory: Negative for cough and wheezing.   Cardiovascular: Negative  for chest pain and palpitations.  Gastrointestinal: Negative for blood in stool, diarrhea, nausea and vomiting.  Genitourinary: Negative for dysuria, frequency and urgency.  Musculoskeletal: Negative for joint pain and myalgias.  Skin: Negative for itching and rash.  Neurological: Negative for dizziness, focal weakness, weakness and headaches.  Endo/Heme/Allergies: Negative for environmental allergies and polydipsia. Does not bruise/bleed easily.  Psychiatric/Behavioral: Negative for depression and memory loss. The patient is not nervous/anxious and does not have insomnia.     Objective:   Blood pressure 132/69, pulse 67, height 4' 10.25" (1.48 m), weight 128 lb (58.1 kg), SpO2 99 %. Body mass index is 26.52 kg/m. General:  Patient with tremulous voice while speaking.  Well Developed, well nourished, and in no acute distress.  Neuro: Alert and oriented x3, extra-ocular muscles intact, sensation grossly intact.  HEENT:[place]/AT, PERRLA, neck supple, No carotid bruits Skin: no gross rashes  Cardiac: Regular rate and rhythm Respiratory: Essentially clear to auscultation bilaterally. Not using accessory muscles, speaking in full sentences.  Abdominal: not grossly distended Musculoskeletal: Ambulates w/o diff, FROM * 4 ext.  Vasc: less 2 sec cap RF, warm and pink  Psych:  No HI/SI, judgement and insight good, Euthymic mood. Full Affect.    Recent Results (from the past 2160 hour(s))  Lipid panel     Status: None   Collection Time: 05/08/19  9:43 AM  Result Value Ref Range   Cholesterol 135 0 - 200 mg/dL    Comment: ATP III Classification       Desirable:  < 200 mg/dL               Borderline High:  200 - 239 mg/dL          High:  > = 240 mg/dL   Triglycerides 106.0 0.0 - 149.0 mg/dL    Comment: Normal:  <150 mg/dLBorderline High:  150 - 199 mg/dL   HDL 51.50 >39.00 mg/dL   VLDL 21.2 0.0 - 40.0 mg/dL   LDL Cholesterol 62 0 - 99 mg/dL   Total CHOL/HDL Ratio 3     Comment:                 Men          Women1/2 Average Risk     3.4          3.3Average Risk          5.0          4.42X Average Risk          9.6          7.13X Average Risk          15.0          11.0                       NonHDL 83.62     Comment: NOTE:  Non-HDL goal should be 30 mg/dL higher than patient's LDL goal (i.e. LDL goal of < 70 mg/dL, would have non-HDL goal of < 100 mg/dL)  Comprehensive metabolic panel     Status: Abnormal   Collection Time: 05/08/19  9:43 AM  Result Value Ref Range   Sodium 137 135 - 145 mEq/L   Potassium 4.1 3.5 - 5.1 mEq/L   Chloride 103 96 - 112 mEq/L   CO2 25 19 - 32 mEq/L   Glucose, Bld 189 (H) 70 - 99 mg/dL   BUN 17 6 - 23 mg/dL   Creatinine, Ser 0.84 0.40 - 1.20 mg/dL   Total Bilirubin 0.4 0.2 - 1.2 mg/dL   Alkaline Phosphatase 67 39 - 117 U/L   AST 18 0 - 37 U/L   ALT 13 0 - 35 U/L  Total Protein 6.7 6.0 - 8.3 g/dL   Albumin 4.0 3.5 - 5.2 g/dL   Calcium 9.3 8.4 - 10.5 mg/dL   GFR 64.93 >60.00 mL/min  Hemoglobin [name]     Status: Abnormal   Collection Time: 05/08/19  9:43 AM  Result Value Ref Range   Hgb [name] MFr Bld 7.1 (H) 4.6 - 6.5 %    Comment: Glycemic Control Guidelines for People with Diabetes:Non Diabetic:  <6%Goal of Therapy: <7%Additional Action Suggested:  >8%   Urinalysis, Routine w reflex microscopic     Status: None   Collection Time: 05/08/19  9:57 AM  Result Value Ref Range   Color, Urine YELLOW Yellow;Lt. Yellow;Straw;Dark Yellow;Amber;Green;Red;Brown   APPearance CLEAR Clear;Turbid;Slightly Cloudy;Cloudy   Specific Gravity, Urine 1.015 1.000 - 1.030   pH 6.0 5.0 - 8.0   Total Protein, Urine NEGATIVE Negative   Urine Glucose NEGATIVE Negative   Ketones, ur NEGATIVE Negative   Bilirubin Urine NEGATIVE Negative   Hgb urine dipstick NEGATIVE Negative   Urobilinogen, UA 0.2 0.0 - 1.0   Leukocytes,Ua NEGATIVE Negative   Nitrite NEGATIVE Negative   WBC, UA 0-2/hpf 0-2/hpf   RBC / HPF none seen 0-2/hpf   Squamous Epithelial / LPF Rare(0-4/hpf)  Rare(0-4/hpf)  Microalbumin / creatinine urine ratio     Status: None   Collection Time: 05/08/19  9:57 AM  Result Value Ref Range   Microalb, Ur <0.7 0.0 - 1.9 mg/dL   Creatinine,U 46.9 mg/dL   Microalb Creat Ratio 1.5 0.0 - 30.0 mg/g

## 2019-07-06 NOTE — Patient Instructions (Signed)
Please call your medical insurance and ask when you are next due for a Medicare wellness examination.   Let them know you have a new primary care physician who does these and we are happy to do them in the future.  -Also prior to your 4 to 84-month follow-up, if you would like to come in for breast exam etc., please make an office visit sometime in the near future for this     Please realize, EXERCISE IS MEDICINE!  -  American Heart Association [facility]) guidelines for exercise : If you are in good health, without any medical conditions, you should engage in 150-300 minutes of moderate intensity aerobic activity per week.  This means you should be huffing and puffing throughout your workout.   Engaging in regular exercise will improve brain function and memory, as well as improve mood, boost immune system and help with weight management.  As well as the other, more well-known effects of exercise such as decreasing blood sugar levels, decreasing blood pressure,  and decreasing bad cholesterol levels/ increasing good cholesterol levels.     -  The AHA strongly endorses consumption of a diet that contains a variety of foods from all the food categories with an emphasis on fruits and vegetables; fat-free and low-fat dairy products; cereal and grain products; legumes and nuts; and fish, poultry, and/or extra lean meats.    Excessive food intake, especially of foods high in saturated and trans fats, sugar, and salt, should be avoided.    Adequate water intake of roughly 1/2 of your weight in pounds, should equal the ounces of water per day you should drink.  So for instance, if you're 200 pounds, that would be 100 ounces of water per day.         Mediterranean Diet  Why follow it? Research shows. . Those who follow the Mediterranean diet have a reduced risk of heart disease  . The diet is associated with a reduced incidence of Parkinson's and Alzheimer's diseases . People following the diet may have  longer life expectancies and lower rates of chronic diseases  . The Dietary Guidelines for Americans recommends the Mediterranean diet as an eating plan to promote health and prevent disease  What Is the Mediterranean Diet?  . Healthy eating plan based on typical foods and recipes of Mediterranean-style cooking . The diet is primarily a plant based diet; these foods should make up a majority of meals   Starches - Plant based foods should make up a majority of meals - They are an important sources of vitamins, minerals, energy, antioxidants, and fiber - Choose whole grains, foods high in fiber and minimally processed items  - Typical grain sources include wheat, oats, barley, corn, brown rice, bulgar, farro, millet, polenta, couscous  - Various types of beans include chickpeas, lentils, fava beans, black beans, white beans   Fruits  Veggies - Large quantities of antioxidant rich fruits & veggies; 6 or more servings  - Vegetables can be eaten raw or lightly drizzled with oil and cooked  - Vegetables common to the traditional Mediterranean Diet include: artichokes, arugula, beets, broccoli, brussel sprouts, cabbage, carrots, celery, collard greens, cucumbers, eggplant, kale, leeks, lemons, lettuce, mushrooms, okra, onions, peas, peppers, potatoes, pumpkin, radishes, rutabaga, shallots, spinach, sweet potatoes, turnips, zucchini - Fruits common to the Mediterranean Diet include: apples, apricots, avocados, cherries, clementines, dates, figs, grapefruits, grapes, melons, nectarines, oranges, peaches, pears, pomegranates, strawberries, tangerines  Fats - Replace butter and margarine with healthy  oils, such as olive oil, canola oil, and tahini  - Limit nuts to no more than a handful a day  - Nuts include walnuts, almonds, pecans, pistachios, pine nuts  - Limit or avoid candied, honey roasted or heavily salted nuts - Olives are central to the Mediterranean diet - can be eaten whole or used in a variety  of dishes   Meats Protein - Limiting red meat: no more than a few times a month - When eating red meat: choose lean cuts and keep the portion to the size of deck of cards - Eggs: approx. 0 to 4 times a week  - Fish and lean poultry: at least 2 a week  - Healthy protein sources include, chicken, [place], lean beef, lamb - Increase intake of seafood such as tuna, salmon, trout, mackerel, shrimp, scallops - Avoid or limit high fat processed meats such as sausage and bacon  Dairy - Include moderate amounts of low fat dairy products  - Focus on healthy dairy such as fat free yogurt, skim milk, low or reduced fat cheese - Limit dairy products higher in fat such as whole or 2% milk, cheese, ice cream  Alcohol - Moderate amounts of red wine is ok  - No more than 5 oz daily for women (all ages) and men older than age 20  - No more than 10 oz of wine daily for men younger than 58  Other - Limit sweets and other desserts  - Use herbs and spices instead of salt to flavor foods  - Herbs and spices common to the traditional Mediterranean Diet include: basil, bay leaves, chives, cloves, cumin, fennel, garlic, lavender, marjoram, mint, oregano, parsley, pepper, rosemary, sage, savory, sumac, tarragon, thyme   It's not just a diet, it's a lifestyle:  . The Mediterranean diet includes lifestyle factors typical of those in the region  . Foods, drinks and meals are best eaten with others and savored . Daily physical activity is important for overall good health . This could be strenuous exercise like running and aerobics . This could also be more leisurely activities such as walking, housework, yard-work, or taking the stairs . Moderation is the key; a balanced and healthy diet accommodates most foods and drinks . Consider portion sizes and frequency of consumption of certain foods   Meal Ideas & Options:  . Breakfast:  o Whole wheat toast or whole wheat English muffins with peanut butter & hard boiled  egg o Steel cut oats topped with apples & cinnamon and skim milk  o Fresh fruit: banana, strawberries, melon, berries, peaches  o Smoothies: strawberries, bananas, greek yogurt, peanut butter o Low fat greek yogurt with blueberries and granola  o Egg white omelet with spinach and mushrooms o Breakfast couscous: whole wheat couscous, apricots, skim milk, cranberries  . Sandwiches:  o Hummus and grilled vegetables (peppers, zucchini, squash) on whole wheat bread   o Grilled chicken on whole wheat pita with lettuce, tomatoes, cucumbers or tzatziki  o Tuna salad on whole wheat bread: tuna salad made with greek yogurt, olives, red peppers, capers, green onions o Garlic rosemary lamb pita: lamb sauted with garlic, rosemary, salt & pepper; add lettuce, cucumber, greek yogurt to pita - flavor with lemon juice and black pepper  . Seafood:  o Mediterranean grilled salmon, seasoned with garlic, basil, parsley, lemon juice and black pepper o Shrimp, lemon, and spinach whole-grain pasta salad made with low fat greek yogurt  o Seared scallops with lemon orzo  o Seared tuna steaks seasoned salt, pepper, coriander topped with tomato mixture of olives, tomatoes, olive oil, minced garlic, parsley, green onions and cappers  . Meats:  o Herbed greek chicken salad with kalamata olives, cucumber, feta  o Red bell peppers stuffed with spinach, bulgur, lean ground beef (or lentils) & topped with feta   o Kebabs: skewers of chicken, tomatoes, onions, zucchini, squash  o [place] burgers: made with red onions, mint, dill, lemon juice, feta cheese topped with roasted red peppers . Vegetarian o Cucumber salad: cucumbers, artichoke hearts, celery, red onion, feta cheese, tossed in olive oil & lemon juice  o Hummus and whole grain pita points with a greek salad (lettuce, tomato, feta, olives, cucumbers, red onion) o Lentil soup with celery, carrots made with vegetable broth, garlic, salt and pepper  o Tabouli salad:  parsley, bulgur, mint, scallions, cucumbers, tomato, radishes, lemon juice, olive oil, salt and pepper.

## 2019-07-09 DIAGNOSIS — Z800 Family history of malignant neoplasm of digestive organs: Secondary | ICD-10-CM

## 2019-07-09 DIAGNOSIS — R251 Tremor, unspecified: Secondary | ICD-10-CM

## 2019-07-09 DIAGNOSIS — Z634 Disappearance and death of family member: Secondary | ICD-10-CM | POA: Insufficient documentation

## 2019-07-09 DIAGNOSIS — Z833 Family history of diabetes mellitus: Secondary | ICD-10-CM | POA: Insufficient documentation

## 2019-07-09 HISTORY — DX: Family history of malignant neoplasm of digestive organs: Z80.0

## 2019-07-09 HISTORY — DX: Tremor, unspecified: R25.1

## 2019-07-09 HISTORY — DX: Family history of diabetes mellitus: Z83.3

## 2019-08-07 ENCOUNTER — Other Ambulatory Visit (INDEPENDENT_AMBULATORY_CARE_PROVIDER_SITE_OTHER): Payer: HMO

## 2019-08-07 ENCOUNTER — Other Ambulatory Visit: Payer: Self-pay

## 2019-08-07 DIAGNOSIS — H04123 Dry eye syndrome of bilateral lacrimal glands: Secondary | ICD-10-CM | POA: Diagnosis not present

## 2019-08-07 DIAGNOSIS — E063 Autoimmune thyroiditis: Secondary | ICD-10-CM

## 2019-08-07 DIAGNOSIS — Z794 Long term (current) use of insulin: Secondary | ICD-10-CM | POA: Diagnosis not present

## 2019-08-07 DIAGNOSIS — H5010 Unspecified exotropia: Secondary | ICD-10-CM | POA: Diagnosis not present

## 2019-08-07 DIAGNOSIS — H353131 Nonexudative age-related macular degeneration, bilateral, early dry stage: Secondary | ICD-10-CM | POA: Diagnosis not present

## 2019-08-07 DIAGNOSIS — H18413 Arcus senilis, bilateral: Secondary | ICD-10-CM | POA: Diagnosis not present

## 2019-08-07 DIAGNOSIS — Z79899 Other long term (current) drug therapy: Secondary | ICD-10-CM | POA: Diagnosis not present

## 2019-08-07 DIAGNOSIS — H0102A Squamous blepharitis right eye, upper and lower eyelids: Secondary | ICD-10-CM | POA: Diagnosis not present

## 2019-08-07 DIAGNOSIS — D508 Other iron deficiency anemias: Secondary | ICD-10-CM | POA: Diagnosis not present

## 2019-08-07 DIAGNOSIS — E1165 Type 2 diabetes mellitus with hyperglycemia: Secondary | ICD-10-CM

## 2019-08-07 DIAGNOSIS — H11153 Pinguecula, bilateral: Secondary | ICD-10-CM | POA: Diagnosis not present

## 2019-08-07 DIAGNOSIS — H40011 Open angle with borderline findings, low risk, right eye: Secondary | ICD-10-CM | POA: Diagnosis not present

## 2019-08-07 DIAGNOSIS — E119 Type 2 diabetes mellitus without complications: Secondary | ICD-10-CM | POA: Diagnosis not present

## 2019-08-07 DIAGNOSIS — H401121 Primary open-angle glaucoma, left eye, mild stage: Secondary | ICD-10-CM | POA: Diagnosis not present

## 2019-08-07 DIAGNOSIS — H0102B Squamous blepharitis left eye, upper and lower eyelids: Secondary | ICD-10-CM | POA: Diagnosis not present

## 2019-08-07 LAB — COMPREHENSIVE METABOLIC PANEL
ALT: 13 U/L (ref 0–35)
AST: 18 U/L (ref 0–37)
Albumin: 3.9 g/dL (ref 3.5–5.2)
Alkaline Phosphatase: 71 U/L (ref 39–117)
BUN: 14 mg/dL (ref 6–23)
CO2: 27 mEq/L (ref 19–32)
Calcium: 9.3 mg/dL (ref 8.4–10.5)
Chloride: 105 mEq/L (ref 96–112)
Creatinine, Ser: 0.75 mg/dL (ref 0.40–1.20)
GFR: 73.95 mL/min (ref >60.00)
Glucose, Bld: 171 mg/dL — ABNORMAL HIGH (ref 70–99)
Potassium: 4.2 mEq/L (ref 3.5–5.1)
Sodium: 138 mEq/L (ref 135–145)
Total Bilirubin: 0.4 mg/dL (ref 0.2–1.2)
Total Protein: 6.6 g/dL (ref 6.0–8.3)

## 2019-08-07 LAB — CBC
HCT: 35.1 % — ABNORMAL LOW (ref 36.0–46.0)
Hemoglobin: 11.6 g/dL — ABNORMAL LOW (ref 12.0–15.0)
MCHC: 33.1 g/dL (ref 30.0–36.0)
MCV: 90.8 fl (ref 78.0–100.0)
Platelets: 261 10*3/uL (ref 150.0–400.0)
RBC: 3.87 Mil/uL (ref 3.87–5.11)
RDW: 14.1 % (ref 11.5–15.5)
WBC: 7.2 10*3/uL (ref 4.0–10.5)

## 2019-08-07 LAB — HEMOGLOBIN A1C: Hgb A1c MFr Bld: 7.5 % — ABNORMAL HIGH (ref 4.6–6.5)

## 2019-08-07 LAB — T4, FREE: Free T4: 1.24 ng/dL (ref 0.60–1.60)

## 2019-08-07 LAB — TSH: TSH: 1.23 u[IU]/mL (ref 0.35–4.50)

## 2019-08-10 ENCOUNTER — Other Ambulatory Visit: Payer: Self-pay

## 2019-08-14 ENCOUNTER — Other Ambulatory Visit: Payer: Self-pay

## 2019-08-14 ENCOUNTER — Ambulatory Visit (INDEPENDENT_AMBULATORY_CARE_PROVIDER_SITE_OTHER): Payer: HMO | Admitting: Endocrinology

## 2019-08-14 ENCOUNTER — Encounter: Payer: Self-pay | Admitting: Endocrinology

## 2019-08-14 ENCOUNTER — Ambulatory Visit: Payer: HMO | Admitting: Endocrinology

## 2019-08-14 VITALS — BP 130/70 | HR 70 | Ht 58.25 in | Wt 128.6 lb

## 2019-08-14 DIAGNOSIS — E1165 Type 2 diabetes mellitus with hyperglycemia: Secondary | ICD-10-CM | POA: Diagnosis not present

## 2019-08-14 DIAGNOSIS — D638 Anemia in other chronic diseases classified elsewhere: Secondary | ICD-10-CM

## 2019-08-14 DIAGNOSIS — E063 Autoimmune thyroiditis: Secondary | ICD-10-CM | POA: Diagnosis not present

## 2019-08-14 DIAGNOSIS — Z794 Long term (current) use of insulin: Secondary | ICD-10-CM | POA: Diagnosis not present

## 2019-08-14 NOTE — Progress Notes (Signed)
Patient ID: Jennifer Bailey, female   DOB: 22-Jul-1935, 84 y.o.   MRN: [id number]   Chief complaint: follow up    History of Present Illness     Type 2 DIABETES MELITUS, date of diagnosis: 2003  She has been on insulin for several years but continues to have labile blood sugars despite using basal bolus regimen  She is very compliant with her insulin at mealtimes  Is taking small amounts of insulin and is not on any oral hypoglycemics She was changed from Levemir to [place] insulin in 1/17 because of relatively inconsistent control   RECENT history:   Insulin regimen: Tresiba 11 units in a.m.;   mealtime NovoLog coverage: At breakfast:  3 units, 4  lunch and 4 at supper     Current blood sugar patterns, diabetes management and problems identified:  Her [name] is 7.5, previously 7.1  Daily management, problems identified and abnormal patterns are discussed in detail in the CGM analysis    She is still getting relatively low sugars after lunch but she did not reduce her Humalog 3 units as directed  With 11 units of Tresiba her morning sugars are generally fairly good  As before she has unexpected highs and lows at various times  She is trying to do some exercise but this does not appear to cause her low sugars  She is not able to adjust her insulin accurately based on what she is eating more carbohydrate counting  However consistent with taking her mealtime dose before starting to eat  Monitors blood glucose:  Usually up to 4 times a day.    Glucometer:  Freestyle libre         CONTINUOUS GLUCOSE MONITORING RECORD INTERPRETATION    Dates of Recording: 10/27 through 11/9   Sensor description:Libre  Results statistics:   CGM use % of time  95  Average and SD  153, CV 32  Time in range       76%  % Time Above 180  19  % Time above 250  3  % Time Below target  2    Glycemic patterns summary:   Hyperglycemic episodes are occurring occasionally after  breakfast and periodically after dinner also   Hypoglycemic episodes occurred mostly between 2-3 PM and sporadically after 6 PM  Overnight periods: Blood sugars are generally fairly steady in the 140-160 range with gradual decline in no hypoglycemia  Preprandial periods: Blood sugar is excellent at breakfast and lunch with some variability Blood sugar may be generally more variable before dinnertime depending on her afternoon hyperglycemia levels  Postprandial periods:   After breakfast: Glucose is generally well controlled with only occasional increase above the 180 target  After lunch:   Blood sugars are generally flat to lower including occasional mild hypoglycemia After dinner: Her blood sugars are variable usually rising blood sugar on occasion but absolute increase on an average is only about 30 mg    PRE-MEAL Fasting Lunch Dinner Bedtime Overall  Glucose range:       Mean/median:  136  133  159  171  153   POST-MEAL PC Breakfast PC Lunch PC Dinner  Glucose range:     Mean/median:  163  135  181      CONTINUOUS GLUCOSE MONITORING RECORD INTERPRETATION    Dates of Recording: 7/28 through 8/10  Sensor description: Freestyle libre  Results statistics:   CGM use % of time  97  Average and SD  34,  GV 34  Time in range    77    % was 80  % Time Above 180  18  % Time above 250   % Time Below target  2    Glycemic patterns summary: She feels that her blood sugars from the freestyle libre are accurate Blood sugars are within the target range majority of the time with only some increase around breakfast time and they midafternoon Hypoglycemia has been minimal and only transiently around 6 PM and once after lunch  Hyperglycemic episodes are occurring most often after lunch This is more so if she has a low normal reading before lunch Has only a couple of significantly high readings going up after breakfast and probably twice after dinner  Hypoglycemic episodes occurred  only about twice, once after lunch and once around dinnertime on the same day Lowest glucose 60  Overnight periods: Blood sugars are generally declining gradually between midnight and 5 AM, about 150 average at midnight and about 115 at waking up time  Preprandial periods: BREAKFAST: Blood sugars appear to be rising between 5-7 AM before breakfast and now then averaging up to 150 Blood sugars are excellent at lunch at about 125 average Blood sugars are on an average higher at dinnertime with more variability and about 165 average  Postprandial periods:   After breakfast: Blood sugars are rising modestly but not clear if this is before the meal most of the time  After lunch:   As above her blood sugars are variably high with average 172 After dinner: Blood sugars are relatively flat after the meal with only a couple of spikes which are modest and average 159   PRE-MEAL Fasting Lunch Dinner Bedtime Overall  Glucose range:       Mean/median:  124  125  165   144   POST-MEAL PC Breakfast PC Lunch PC Dinner  Glucose range:     Mean/median:  151  172  142      Symptoms with hypoglycemia: feels weak and shaky Low sugars are treated with Soft drink or glucose tablets      Meals: 3 meals per day.  Usually has small portions. Eggs/bread or one pancake with protein at 7 am; has mid morning snack.  Has hs snack if blood sugar not high Evening meal usually balanced at 5 pm with 1-2 carbohydrates  Physical activity: exercise: Some walking and yardwork         Dietician visit: Most recent: 2004            Wt Readings from Last 3 Encounters:  08/14/19 128 lb 9.6 oz (58.3 kg)  07/06/19 128 lb (58.1 kg)  05/15/19 128 lb 12.8 oz ([id number] kg)   Complications: are: Mild neuropathy controlled symptomatically  Lab Results  Component Value Date   HGBA1C 7.5 (H) 08/07/2019   HGBA1C 7.1 (H) 05/08/2019   HGBA1C 7.8 (H) 02/06/2019   Lab Results  Component Value Date   MICROALBUR <0.7 05/08/2019    LDLCALC 62 05/08/2019   CREATININE 0.75 08/07/2019    HYPERTENSION:  this has been mild and treated with 50 mg losartan.  She is checking her blood pressure at home and she thinks it is sometimes below [id number] systolic but has not had her meter compared to the office reading No lightheadedness   BP Readings from Last 3 Encounters:  08/14/19 130/70  07/06/19 132/69  05/15/19 140/60    OSTEOPENIA:  She has had osteopenia with T score -2.3 at  the hip in 3/09 and was started on Fosamax at that time. This was stopped in 02/2014.   No history of fracture  follow-up bone density in 2016: DualFemur Neck Right 10/03/2015 T score -1.9   History of vitamin D Deficiency, she is taking 50,000 units monthly with adequate levels  Lab Results  Component Value Date   VD25OH 60.16 02/06/2019   VD25OH 64.10 07/11/2018    OTHER active problems: See review of systems   LABS:    No visits with results within 1 Week(s) from this visit.  Latest known visit with results is:  Lab on 08/07/2019  Component Date Value Ref Range Status  . WBC 08/07/2019 7.2  4.0 - 10.5 K/uL Final  . RBC 08/07/2019 3.87  3.87 - 5.11 Mil/uL Final  . Platelets 08/07/2019 261.0  150.0 - 400.0 K/uL Final  . Hemoglobin 08/07/2019 11.6* 12.0 - 15.0 g/dL Final  . HCT 08/07/2019 35.1* 36.0 - 46.0 % Final  . MCV 08/07/2019 90.8  78.0 - 100.0 fl Final  . MCHC 08/07/2019 33.1  30.0 - 36.0 g/dL Final  . RDW 08/07/2019 14.1  11.5 - 15.5 % Final  . Free T4 08/07/2019 1.24  0.60 - 1.60 ng/dL Final   Comment: Specimens from patients who are undergoing biotin therapy and /or ingesting biotin supplements may contain high levels of biotin.  The higher biotin concentration in these specimens interferes with this Free T4 assay.  Specimens that contain high levels  of biotin may cause false high results for this Free T4 assay.  Please interpret results in light of the total clinical presentation of the patient.    Marland Kitchen TSH  08/07/2019 1.23  0.35 - 4.50 uIU/mL Final  . Sodium 08/07/2019 138  135 - 145 mEq/L Final  . Potassium 08/07/2019 4.2  3.5 - 5.1 mEq/L Final  . Chloride 08/07/2019 105  96 - 112 mEq/L Final  . CO2 08/07/2019 27  19 - 32 mEq/L Final  . Glucose, Bld 08/07/2019 171* 70 - 99 mg/dL Final  . BUN 08/07/2019 14  6 - 23 mg/dL Final  . Creatinine, Ser 08/07/2019 0.75  0.40 - 1.20 mg/dL Final  . Total Bilirubin 08/07/2019 0.4  0.2 - 1.2 mg/dL Final  . Alkaline Phosphatase 08/07/2019 71  39 - 117 U/L Final  . AST 08/07/2019 18  0 - 37 U/L Final  . ALT 08/07/2019 13  0 - 35 U/L Final  . Total Protein 08/07/2019 6.6  6.0 - 8.3 g/dL Final  . Albumin 08/07/2019 3.9  3.5 - 5.2 g/dL Final  . Calcium 08/07/2019 9.3  8.4 - 10.5 mg/dL Final  . GFR 08/07/2019 73.95  >60.00 mL/min Final  . Hgb [name] MFr Bld 08/07/2019 7.5* 4.6 - 6.5 % Final   Glycemic Control Guidelines for People with Diabetes:Non Diabetic:  <6%Goal of Therapy: <7%Additional Action Suggested:  >8%     Allergies as of 08/14/2019      Reactions   Macrodantin [nitrofurantoin Macrocrystal] Swelling   Prednisone    Make real "shaky-like, nervious"   Penicillins Rash   Has patient had a PCN reaction causing immediate rash, facial/tongue/throat swelling, SOB or lightheadedness with hypotension:Yes Has patient had a PCN reaction causing severe rash involving mucus membranes or skin necrosis: No Has patient had a PCN reaction that required hospitalization: No Has patient had a PCN reaction occurring within the last 10 years:No If all of the above answers are "NO", then may proceed with Cephalosporin use.  Medication List       Accurate as of August 14, 2019 11:59 PM. If you have any questions, ask your nurse or doctor.        Alcohol Pads 70 % Pads USE 4 PER DAY   aspirin 81 MG tablet Take 81 mg by mouth daily.   BD Pen Needle Nano U/F 32G X 4 MM Misc Generic drug: Insulin Pen Needle USE AS DIRECTED FIVE TIMES A DAY.    cycloSPORINE 0.05 % ophthalmic emulsion Commonly known as: RESTASIS 1 drop 2 (two) times daily.   fish oil-omega-3 fatty acids 1000 MG capsule Take 2 g by mouth daily. Two a day   FreeStyle Libre 14 Day Reader Kerrin Mo 1 each by Does not apply route daily. USE DAILY TO CHECK BLOOD SUGAR WITH FREESTYLE LIBRE SENSOR.   FreeStyle Libre 14 Day Sensor Misc APPLY TO ARM EVERY 14 DAYS TO MONITOR BLOOD SUGAR.   gabapentin 100 MG capsule Commonly known as: NEURONTIN TAKE 3 TO 4 CAPSULES BY MOUTH AT BEDTIME.   levothyroxine 75 MCG tablet Commonly known as: SYNTHROID TAKE 1 TABLET BY MOUTH DAILY BEFORE BREAKFAST, EXCEPT TAKE 1 AND 1/2 TABLETS ON SUNDAY.   LORazepam 0.5 MG tablet Commonly known as: ATIVAN TAKE 1 TABLET BY MOUTH 2 TIMES DAILY AS NEEDED FOR ANXIETY.   losartan 50 MG tablet Commonly known as: COZAAR TAKE 1 TABLET BY MOUTH DAILY.   lovastatin 40 MG tablet Commonly known as: MEVACOR TAKE 1 TABLET BY MOUTH ONCE DAILY WITH A MEAL.   NovoLOG FlexPen 100 UNIT/ML FlexPen Generic drug: insulin aspart INJECT 3 TO 5 UNITS SUBCUTANEOUSLY BEFORE MEALS AS DIRECTED.   pantoprazole 40 MG tablet Commonly known as: PROTONIX Take 40 mg by mouth 2 (two) times daily.   PreserVision AREDS 2 Caps Take by mouth.   sucralfate 1 g tablet Commonly known as: CARAFATE Take 1 g by mouth 2 (two) times daily as needed.   Tyler Aas FlexTouch 100 UNIT/ML Sopn FlexTouch Pen Generic drug: insulin degludec Inject 11 Units into the skin daily. Inject 11 units under the skin once daily. What changed: Another medication with the same name was removed. Continue taking this medication, and follow the directions you see here. Changed by: Elayne Snare, MD   vitamin C 100 MG tablet Take 500 mg by mouth daily.   Vitamin D (Ergocalciferol) 1.25 MG (50000 UT) Caps capsule Commonly known as: DRISDOL TAKE 1 CAPSULE BY MOUTH ONCE A MONTH.       Allergies:  Allergies  Allergen Reactions  . Macrodantin  [Nitrofurantoin Macrocrystal] Swelling  . Prednisone     Make real "shaky-like, nervious"  . Penicillins Rash    Has patient had a PCN reaction causing immediate rash, facial/tongue/throat swelling, SOB or lightheadedness with hypotension:Yes Has patient had a PCN reaction causing severe rash involving mucus membranes or skin necrosis: No Has patient had a PCN reaction that required hospitalization: No Has patient had a PCN reaction occurring within the last 10 years:No If all of the above answers are "NO", then may proceed with Cephalosporin use.     Past Medical History:  Diagnosis Date  . Diabetes mellitus without complication ([place])   . Thyroid disease     Past Surgical History:  Procedure Laterality Date  . ABDOMINAL HYSTERECTOMY    . CHOLECYSTECTOMY    . COLON SURGERY      Family History  Problem Relation Age of Onset  . Cancer Mother   . Diabetes Brother  Social History:  reports that she has never smoked. She has never used smokeless tobacco. She reports that she does not drink alcohol or use drugs.   Review of Systems   NEUROPATHY: She has stable control of her symptoms with gabapentin at night  Last colonoscopy showed only hemorrhoids in 7/15 but she has been recommended follow-up in 5 years  ANEMIA: Her hemoglobin is slightly low B12 normal previously and iron level is normal -  Hemoccult was negative in 12/18   Lab Results  Component Value Date   WBC 7.2 08/07/2019   HGB 11.6 (L) 08/07/2019   HCT 35.1 (L) 08/07/2019   MCV 90.8 08/07/2019   PLT 261.0 08/07/2019    HYPOTHYROIDISM: She has had primary hypothyroidism, long-standing . Her TSH levels have been stable with 75 mcg levothyroxine 7.5 tablets per week, has been on this dose since 11/15   Lab Results  Component Value Date   FREET4 1.24 08/07/2019   FREET4 1.14 10/10/2018   FREET4 1.01 07/11/2018   TSH 1.23 08/07/2019   TSH 1.08 10/10/2018   TSH 3.19 07/11/2018      HYPERLIPIDEMIA: The lipid abnormality consists of elevated LDL,  taking lovastatin with consistently good control, last level below 70.  Lab Results  Component Value Date   CHOL 135 05/08/2019   HDL 51.50 05/08/2019   LDLCALC 62 05/08/2019   TRIG 106.0 05/08/2019   CHOLHDL 3 05/08/2019        Examination:   BP 130/70 (BP Location: Left Arm, Patient Position: Sitting, Cuff Size: Normal)   Pulse 70   Ht 4' 10.25" (1.48 m)   Wt 128 lb 9.6 oz (58.3 kg)   SpO2 99%   BMI 26.65 kg/m   Body mass index is 26.65 kg/m.    ASSESSMENT/ PLAN:     Diabetes type 2 on insulin   See history of present illness for detailed discussion of current management, blood sugar patterns and problems identified  Her [name] is still fairly good at 7.5  She still has variable postprandial readings as discussed above Lowest blood sugars are after lunch and may be partly related to the level of activity she has midday and afternoon  She will need to reduce her lunchtime dose to 3 units Adjust premeal blood sugar based on how many carbohydrates she is eating and also reduce it is planning to be very active after the meal She will continue the same dose of NovoLog at dinner but may need up to 6 units for eating larger meals or adding a dessert To call if her fasting readings are unusually high or low to adjust her Tyler Aas Reminded her to not the full dose of insulin if she forgets to take it within 15 minutes of her meal  Discussed appropriate treatment of hypoglycemia  Hypertension: Her blood pressure is well-controlled She will continue 50 mg of losartan as before   Hypothyroidism :her TSH is normal and she will continue the same dose  ANEMIA: She will need to follow-up with her PCP regarding this and discussed evaluation and management  She is up-to-date with her influenza vaccine and Pneumovax   Follow-up in 3 months  Total visit time for evaluation and management of multiple problems and  counseling =25 minutes     Patient Instructions  Take 3 NOVOLOG at lunch     Elayne Snare 08/15/2019, 10:05 AM

## 2019-08-14 NOTE — Patient Instructions (Signed)
Take 3 NOVOLOG at lunch

## 2019-08-18 ENCOUNTER — Other Ambulatory Visit: Payer: Self-pay | Admitting: Endocrinology

## 2019-08-19 ENCOUNTER — Other Ambulatory Visit: Payer: Self-pay | Admitting: Endocrinology

## 2019-08-21 ENCOUNTER — Other Ambulatory Visit: Payer: Self-pay

## 2019-08-21 ENCOUNTER — Telehealth: Payer: Self-pay | Admitting: Endocrinology

## 2019-08-21 MED ORDER — TRESIBA FLEXTOUCH 100 UNIT/ML ~~LOC~~ SOPN: 11.0000 [IU] | PEN_INJECTOR | Freq: Every day | SUBCUTANEOUS | 2 refills | Status: DC

## 2019-08-21 NOTE — Telephone Encounter (Signed)
Rx sent 

## 2019-08-21 NOTE — Telephone Encounter (Signed)
MEDICATION: [place]  PHARMACY:  [facility], [place]  IS THIS A 90 DAY SUPPLY :   IS PATIENT OUT OF MEDICATION: yes  IF NOT; HOW MUCH IS LEFT:   LAST APPOINTMENT DATE: @11 /14/2020  NEXT APPOINTMENT DATE:@3 /05/2020  DO WE HAVE YOUR PERMISSION TO LEAVE A DETAILED MESSAGE:  OTHER COMMENTS:    **Let patient know to contact pharmacy at the end of the day to make sure medication is ready. **  ** Please notify patient to allow 48-72 hours to process**  **Encourage patient to contact the pharmacy for refills or they can request refills through [facility]**

## 2019-09-09 ENCOUNTER — Other Ambulatory Visit: Payer: Self-pay | Admitting: Endocrinology

## 2019-10-11 DIAGNOSIS — H0102B Squamous blepharitis left eye, upper and lower eyelids: Secondary | ICD-10-CM | POA: Diagnosis not present

## 2019-10-11 DIAGNOSIS — H353131 Nonexudative age-related macular degeneration, bilateral, early dry stage: Secondary | ICD-10-CM | POA: Diagnosis not present

## 2019-10-11 DIAGNOSIS — Z79899 Other long term (current) drug therapy: Secondary | ICD-10-CM | POA: Diagnosis not present

## 2019-10-11 DIAGNOSIS — Z794 Long term (current) use of insulin: Secondary | ICD-10-CM | POA: Diagnosis not present

## 2019-10-11 DIAGNOSIS — H401221 Low-tension glaucoma, left eye, mild stage: Secondary | ICD-10-CM | POA: Diagnosis not present

## 2019-10-11 DIAGNOSIS — H11153 Pinguecula, bilateral: Secondary | ICD-10-CM | POA: Diagnosis not present

## 2019-10-11 DIAGNOSIS — E119 Type 2 diabetes mellitus without complications: Secondary | ICD-10-CM | POA: Diagnosis not present

## 2019-10-11 DIAGNOSIS — H0102A Squamous blepharitis right eye, upper and lower eyelids: Secondary | ICD-10-CM | POA: Diagnosis not present

## 2019-10-11 DIAGNOSIS — H04123 Dry eye syndrome of bilateral lacrimal glands: Secondary | ICD-10-CM | POA: Diagnosis not present

## 2019-10-11 DIAGNOSIS — H18413 Arcus senilis, bilateral: Secondary | ICD-10-CM | POA: Diagnosis not present

## 2019-10-11 DIAGNOSIS — H40011 Open angle with borderline findings, low risk, right eye: Secondary | ICD-10-CM | POA: Diagnosis not present

## 2019-10-11 LAB — HM DIABETES EYE EXAM

## 2019-10-17 ENCOUNTER — Other Ambulatory Visit: Payer: Self-pay | Admitting: Endocrinology

## 2019-10-24 ENCOUNTER — Other Ambulatory Visit: Payer: Self-pay

## 2019-10-24 NOTE — Patient Outreach (Signed)
  [place] [facility]) Care Management Chronic Special Needs Program    10/24/2019  Name: Jennifer Bailey, DOB: [birth year]  MRN: [id number]   Ms. Tenea Stay is enrolled in a chronic special needs plan for Diabetes. RNCM called to review Health Risk assessment and update care plan. RNCM spoke with client, two client identifiers confirmed. Client states she has company and states this is not a good time to talk.  Plan: RNCM rescheduled telephonic follow up.  Thea Silversmith, RN, MSN, [place] [phone]

## 2019-10-25 ENCOUNTER — Other Ambulatory Visit: Payer: Self-pay

## 2019-10-25 NOTE — Patient Outreach (Signed)
  [place] [facility]) Care Management Chronic Special Needs Program    10/25/2019  Name: Jennifer Bailey, DOB: [birth year]  MRN: [id number]   Ms. Jennifer Bailey is enrolled in a chronic special needs plan foDiabetes. RNCM called to review health risk assessment and update care pr lan. Client answered and confirmed two client identifiers. She states next week is a better time.   Plan: RNCM will outreach to client next week.  Thea Silversmith, RN, MSN, [place] [phone]

## 2019-10-30 ENCOUNTER — Other Ambulatory Visit: Payer: Self-pay

## 2019-10-30 NOTE — Patient Outreach (Addendum)
[place] [facility]) Care Management Chronic Special Needs Program  10/30/2019  Name: Jennifer Bailey DOB: 1935/02/20  MRN: [id number]  Ms. Drystal Nersesian is enrolled in a chronic special needs plan for Diabetes. Chronic Care Management Coordinator telephoned client to review health risk assessment and to update individualized care plan.  RNCM reviewed the chronic care management program, importance of client participation, and taking their care plan to all provider appointments and inpatient facilities.    Subjective: client reports she is doing well. She states has has one daughter that lives with her and she is in the process of helping another daughter to move in temporarily. Client states her daughters are supportive and they assist her with transportation. Client denies any medication needs. She states she manages her medication without difficulty. she checks blood sugar about 4 times a day-freestyle libre. She states blood sugar this morning was 180 before meals. Client states she did not get up and eat during the middle of the night. Blood sugar was just elevated this morning. Last [name] 7.5 on 08/07/2019. she reports some episodes of blood sugar less than 70. She states she treats with eating, glucose tablet or juice. RNCM reinforced recommendation of rechecking 15 minutes after treatment to ensure her blood sugar is coming back up. Client states she exercises every day with a kickboxing video and stays active. She denies any questions or concerns.  Goals Addressed            This Visit's Progress   . Client will verbalize knowledge of self management of Hypertension as evidences by BP reading of 140/90 or less; or as defined by provider   On track    Client verbalized taking medications, attends provider visits as scheduled, client reports she keeps a check on her blood pressure reading and it is less than 140/90.    Marland Kitchen COMPLETED: Decrease inpatient admissions/ readmissions with  in the next year       No admissions 2020    . COMPLETED: Decrease inpatient diabetes admissions/readmissions with in the year       No admissions    . COMPLETED: Decrease the use of hospital emergency department related to diabetes within the next year        No use of emergency room    . HEMOGLOBIN [name] < 7.0       Diabetes self management actions: Glucose monitoring per provider recommendations Eat Healthy Check feet daily Visit provider every 3-6 months as directed Hbg [name] level every 3-6 months. Eye Exam yearly    . Obtain annual  Lipid Profile, LDL-C   On track    Per chart 05/08/2019    . Obtain Annual Eye (retinal)  Exam    On track    Per client 05/08/2019    . Obtain Annual Foot Exam   On track    Client reports done 2020 per house call nurse practioner    . Obtain annual screen for micro albuminuria (urine) , nephropathy (kidney problems)   On track    Done 05/08/2019    . Obtain Hemoglobin [name] at least 2 times per year   On track   . Visit Primary Care Provider or Endocrinologist at least 2 times per year    On track     Covid -19 precautions discussed. Encouraged client to continue to wear face mask when out, stay at least 6 feet distance and handwashing.  RNCM reinforced calling the 24 hour nurse advice line if health  questions, reinforced calling the health care concierge for benefits questions and encouraged client to call RNCM as needed.  Plan: RNCM will send updated care plan to client; send updated care plan to primary care; send educational material; RNCM will follow up at next tier level within the next 9-12 months.     Thea Silversmith, RN, MSN, [place] [phone]

## 2019-11-17 ENCOUNTER — Ambulatory Visit: Payer: HMO | Attending: Internal Medicine

## 2019-11-17 DIAGNOSIS — Z23 Encounter for immunization: Secondary | ICD-10-CM | POA: Insufficient documentation

## 2019-11-17 NOTE — Progress Notes (Signed)
   Covid-19 Vaccination Clinic  Name:  Jennifer Bailey    MRN: [id number] DOB: Feb 15, 1935  11/17/2019  [name] was observed post Covid-19 immunization for 15 minutes without incidence. She was provided with Vaccine Information Sheet and instruction to access the V-Safe system.   [name] was instructed to call 911 with any severe reactions post vaccine: Marland Kitchen Difficulty breathing  . Swelling of your face and throat  . A fast heartbeat  . A bad rash all over your body  . Dizziness and weakness    Immunizations Administered    Name Date Dose VIS Date Route   Pfizer COVID-19 Vaccine 11/17/2019  2:48 PM 0.3 mL 09/15/2019 Intramuscular   Manufacturer: [place]   Lot: [id number]   [place]: [id number]

## 2019-12-10 ENCOUNTER — Ambulatory Visit: Payer: HMO | Attending: Internal Medicine

## 2019-12-10 DIAGNOSIS — Z23 Encounter for immunization: Secondary | ICD-10-CM | POA: Insufficient documentation

## 2019-12-10 NOTE — Progress Notes (Signed)
   Covid-19 Vaccination Clinic  Name:  CHEMEKA YARBOUGH    MRN: [id number] DOB: October 01, 1935  12/10/2019  Ms. Mosso was observed post Covid-19 immunization for 15 minutes without incident. She was provided with Vaccine Information Sheet and instruction to access the V-Safe system.   Ms. Bowlen was instructed to call 911 with any severe reactions post vaccine: Marland Kitchen Difficulty breathing  . Swelling of face and throat  . A fast heartbeat  . A bad rash all over body  . Dizziness and weakness   Immunizations Administered    Name Date Dose VIS Date Route   Pfizer COVID-19 Vaccine 12/10/2019 12:59 PM 0.3 mL 09/15/2019 Intramuscular   Manufacturer: [place]   Lot: [id number]   [place]: [id number]

## 2019-12-11 ENCOUNTER — Other Ambulatory Visit (INDEPENDENT_AMBULATORY_CARE_PROVIDER_SITE_OTHER): Payer: HMO

## 2019-12-11 ENCOUNTER — Other Ambulatory Visit: Payer: Self-pay

## 2019-12-11 DIAGNOSIS — E063 Autoimmune thyroiditis: Secondary | ICD-10-CM | POA: Diagnosis not present

## 2019-12-11 DIAGNOSIS — E1165 Type 2 diabetes mellitus with hyperglycemia: Secondary | ICD-10-CM | POA: Diagnosis not present

## 2019-12-11 DIAGNOSIS — H0102B Squamous blepharitis left eye, upper and lower eyelids: Secondary | ICD-10-CM | POA: Diagnosis not present

## 2019-12-11 DIAGNOSIS — H401221 Low-tension glaucoma, left eye, mild stage: Secondary | ICD-10-CM | POA: Diagnosis not present

## 2019-12-11 DIAGNOSIS — Z794 Long term (current) use of insulin: Secondary | ICD-10-CM

## 2019-12-11 DIAGNOSIS — H353131 Nonexudative age-related macular degeneration, bilateral, early dry stage: Secondary | ICD-10-CM | POA: Diagnosis not present

## 2019-12-11 DIAGNOSIS — H0102A Squamous blepharitis right eye, upper and lower eyelids: Secondary | ICD-10-CM | POA: Diagnosis not present

## 2019-12-11 DIAGNOSIS — Z961 Presence of intraocular lens: Secondary | ICD-10-CM | POA: Diagnosis not present

## 2019-12-11 DIAGNOSIS — E119 Type 2 diabetes mellitus without complications: Secondary | ICD-10-CM | POA: Diagnosis not present

## 2019-12-11 DIAGNOSIS — H40011 Open angle with borderline findings, low risk, right eye: Secondary | ICD-10-CM | POA: Diagnosis not present

## 2019-12-11 LAB — COMPREHENSIVE METABOLIC PANEL
ALT: 15 U/L (ref 0–35)
AST: 20 U/L (ref 0–37)
Albumin: 3.8 g/dL (ref 3.5–5.2)
Alkaline Phosphatase: 68 U/L (ref 39–117)
BUN: 15 mg/dL (ref 6–23)
CO2: 28 mEq/L (ref 19–32)
Calcium: 9.2 mg/dL (ref 8.4–10.5)
Chloride: 106 mEq/L (ref 96–112)
Creatinine, Ser: 0.76 mg/dL (ref 0.40–1.20)
GFR: 72.77 mL/min (ref >60.00)
Glucose, Bld: 105 mg/dL — ABNORMAL HIGH (ref 70–99)
Potassium: 4.1 mEq/L (ref 3.5–5.1)
Sodium: 139 mEq/L (ref 135–145)
Total Bilirubin: 0.4 mg/dL (ref 0.2–1.2)
Total Protein: 6.7 g/dL (ref 6.0–8.3)

## 2019-12-11 LAB — HM DIABETES EYE EXAM

## 2019-12-11 LAB — TSH: TSH: 1.08 u[IU]/mL (ref 0.35–4.50)

## 2019-12-11 LAB — HEMOGLOBIN A1C: Hgb A1c MFr Bld: 7.5 % — ABNORMAL HIGH (ref 4.6–6.5)

## 2019-12-13 ENCOUNTER — Encounter: Payer: Self-pay | Admitting: Family Medicine

## 2019-12-13 ENCOUNTER — Ambulatory Visit (INDEPENDENT_AMBULATORY_CARE_PROVIDER_SITE_OTHER): Payer: HMO | Admitting: Family Medicine

## 2019-12-13 ENCOUNTER — Other Ambulatory Visit: Payer: Self-pay

## 2019-12-13 VITALS — BP 135/69 | HR 69 | Temp 97.4°F | Resp 12 | Ht 58.25 in | Wt 133.8 lb

## 2019-12-13 DIAGNOSIS — E1159 Type 2 diabetes mellitus with other circulatory complications: Secondary | ICD-10-CM

## 2019-12-13 DIAGNOSIS — E785 Hyperlipidemia, unspecified: Secondary | ICD-10-CM | POA: Diagnosis not present

## 2019-12-13 DIAGNOSIS — E1165 Type 2 diabetes mellitus with hyperglycemia: Secondary | ICD-10-CM

## 2019-12-13 DIAGNOSIS — E1169 Type 2 diabetes mellitus with other specified complication: Secondary | ICD-10-CM

## 2019-12-13 DIAGNOSIS — I10 Essential (primary) hypertension: Secondary | ICD-10-CM | POA: Diagnosis not present

## 2019-12-13 DIAGNOSIS — D649 Anemia, unspecified: Secondary | ICD-10-CM | POA: Insufficient documentation

## 2019-12-13 DIAGNOSIS — E1142 Type 2 diabetes mellitus with diabetic polyneuropathy: Secondary | ICD-10-CM | POA: Diagnosis not present

## 2019-12-13 NOTE — Progress Notes (Signed)
2nd OV with pt- established care in Oct 2020   Impression and Recommendations:    1. Uncontrolled type 2 diabetes mellitus with hyperglycemia ([name])   2. Diabetic polyneuropathy associated with type 2 diabetes mellitus ([place])   3. Hypertension associated with diabetes ([place])-  on ACE/ARB due to diabetes   4. Hyperlipidemia associated with type 2 diabetes mellitus ([name])-  on statin due to diabetes dx   5. Anemia, unspecified type     - Established as new patient 07/06/2019. - Reviewed recent lab work (12/11/2019) with patient today.   - Patient will review this lab work with Dr. Dwyane Bailey of endocrinology as well.   Anemia Panel - H&H four months ago was 11.6 and 35.1. - Additional lab work (anemia panel, CBC) will be added today. - Will continue to monitor.   Uncontrolled Type 2 DM - Patient is managed by Dr. Dwyane Bailey. - Encouraged patient to continue to follow up with endocrinology every 3 months or however often they say, and PCP twice per year.  - [name] was 7.5 last check.  Advised patient that at her age, and [name] of  7.5 is considered more than acceptable, at goal less than 8.0.  Emphasized need to avoid low blood sugars, as these can be dangerous in older patients.  - Patient will work with Dr. Dwyane Bailey to stabilize her blood sugars to avoid extreme highs and lows.  - Continue treatment plan as advised by Dr. Dwyane Bailey.  - Continue monitoring FBS and 2 hours after the biggest meal of your day.  Keep log and bring in for specialist to review.  - We will continue to monitor alongside specialist.   Hypertension associated with Type 2 DM - BP currently stable, at goal. - Continue treatment regimen.  See med list.  - Advised patient to watch for symptoms of orthostatic hypotension such as dizziness, lightheadedness, etc.  Encouraged patient to transition positions slowly, such as from sitting to standing.  - Counseled patient on pathophysiology of disease and discussed various  treatment options, which always includes dietary and lifestyle modification as first line.   - Lifestyle changes such as dash and heart healthy diets and engaging in a regular exercise program discussed extensively with patient.   - Ambulatory blood pressure monitoring encouraged at least 3 times weekly.  Keep log and bring in every office visit.  Reminded patient that if they ever feel poorly in any way, to check their blood pressure and pulse.  - We will continue to monitor.   Hyperlipidemia associated with Type 2 DM - Cholesterol levels at goal last check. - Pt will continue current treatment regimen.  See med list. - We will continue to monitor and re-check as discussed.   Diabetic Polyneuropathy associated with Type 2 DM - Stable at this time. - Continue management as established. - Will continue to monitor.   COVID-19 Vaccination Status - Patient has obtained both of her COVID-19 vaccinations.   Recommendations - Return in 4 months for chronic follow-up. - Encouraged patient to continue to follow up with endocrinology every 3 months, and PCP twice per year. - In addition to chronic follow-up, advised patient to obtain a Medicare Wellness exam once yearly.   Orders Placed This Encounter  Procedures  . Vitamin B12  . Folate  . Iron and TIBC  . Ferritin  . Reticulocytes  . Transferrin  . CBC    Expresses verbal understanding and consents to current therapy and treatment regimen.  No  barriers to understanding were identified.    Please see AVS handed out to patient at the end of our visit for further patient instructions/ counseling done pertaining to today's office visit.   Return for 16mo- for BP, CHol, BS along with Jennifer Bailey.     Note:  This note was prepared with assistance of Dragon voice recognition software. Occasional wrong-word or sound-a-like substitutions may have occurred due to the inherent limitations of voice recognition software.   The [place] was signed into law in 2016 which includes the topic of electronic health records.  This provides immediate access to information in MyChart.  This includes consultation notes, operative notes, office notes, lab results and pathology reports.  If you have any questions about what you read please let us know at your next visit or call us at the office.  We are right here with you.  This document serves as a record of services personally performed by Jennifer Dance, DO. It was created on her behalf by Jennifer Bailey, a trained medical scribe. The creation of this record is based on the scribe's personal observations and the provider's statements to them.   This case required medical decision making of at least moderate complexity. The above documentation from Jennifer Bailey, medical scribe, has been reviewed by Jennifer Bailey, D.O.      --------------------------------------------------------------------------------------------------------------------------------------------------------------------------------------------------------------------------------------------    Subjective:     Jennifer Bailey, am serving as scribe for Dr.Yesenia Bailey.   HPI: Jennifer Bailey is a 85 y.o. female who presents to [place] [facility] today for issues as discussed below.  Notes today that she's been "pretty good."  Denies any new problems, concerns or issues to discuss.  Denies new concerns at home, and denies mood difficulty.  - Possibility of Anemia Was placed on iron pills at one time and notes Dr. Dwyane Bailey told her to tell her PCP that she was on the "verge of anemia."  - Sleep Habits Notes she has some difficulty sleeping.  Says "I usually try to go to bed a little after nine, and now I'm trying to wait until quarter to ten."  Sleeps on two pillows most of the time.  Confirms it's been this way for a while.  Notes she does not lay flat at night.  Has  not felt unusually SOB laying on two pillows.  Denies concerns overall.  - Lorazepam prescription Does not take lorazepam regularly.  Notes she was given a script when one of her family members passed, but reports that she does not currently take this.  - Physical activity at home She does not use a cane at home.  Notes she does not have a cane of her own.    - Exercise Says she exercises every morning using a kickboxing-type video.  Says "every morning that I take it, I check it on my calendar, and last month I had it every day."  Thinks the video lasts 30-45 minutes, and she exercises her legs afterward.  - GI States her last colonoscopy was her final colonoscopy.  Notes she was told that she "doesn't have any stomach problems."  HPI:   Diabetes Mellitus:  Followed by Dr. Dwyane Bailey  Home glucose readings:  Says "up and down all the time, sometimes they drop, and sometimes they're higher."  She's working with Dr. Dwyane Bailey and returns for follow up with him on Monday.   Notes in the past, due to her diabetes, she was told  to eat two peanut butter crackers before bed to keep her sugars more well-regulated.  - Patient reports good compliance with therapy plan: medication and/or lifestyle modification.  - Her denies acute concerns or problems related to treatment plan  - She denies new concerns.  Denies polyuria/polydipsia, hypo/ hyperglycemia symptoms.  Denies new onset of: chest pain, exercise intolerance, shortness of breath, dizziness, visual changes, headache, lower extremity swelling or claudication.   Last [name] in the office was:  Lab Results  Component Value Date   HGBA1C 7.5 (H) 12/11/2019   HGBA1C 7.5 (H) 08/07/2019   HGBA1C 7.1 (H) 05/08/2019   Lab Results  Component Value Date   MICROALBUR <0.7 05/08/2019   LDLCALC 62 05/08/2019   CREATININE 0.76 12/11/2019   BP Readings from Last 3 Encounters:  12/13/19 135/69  08/14/19 130/70  07/06/19 132/69   Wt Readings from Last 3  Encounters:  12/13/19 133 lb 12.8 oz (60.7 kg)  08/14/19 128 lb 9.6 oz (58.3 kg)  07/06/19 128 lb (58.1 kg)   HPI:  Hypertension:  -  Her blood pressure at home has been running: 130's/60's on average.  Denies unusually high or low blood pressures.  Patient denies orthostatic hypotension.  - Patient reports good compliance with medication and/or lifestyle modification  - Her denies acute concerns or problems related to treatment plan  - She denies new onset of: chest pain, exercise intolerance, shortness of breath, dizziness, visual changes, headache, lower extremity swelling or claudication.    Last 3 blood pressure readings in our office are as follows: BP Readings from Last 3 Encounters:  12/13/19 135/69  08/14/19 130/70  07/06/19 132/69   Filed Weights   12/13/19 1410  Weight: 133 lb 12.8 oz (60.7 kg)      Wt Readings from Last 3 Encounters:  12/13/19 133 lb 12.8 oz (60.7 kg)  08/14/19 128 lb 9.6 oz (58.3 kg)  07/06/19 128 lb (58.1 kg)   BP Readings from Last 3 Encounters:  12/13/19 135/69  08/14/19 130/70  07/06/19 132/69   Pulse Readings from Last 3 Encounters:  12/13/19 69  08/14/19 70  07/06/19 67   BMI Readings from Last 3 Encounters:  12/13/19 27.72 kg/m  08/14/19 26.65 kg/m  07/06/19 26.52 kg/m     Patient Care Team    Relationship Specialty Notifications Start End  Jennifer Dance, DO PCP - General Family Medicine  07/06/19   Luretha Rued, [place] Management  Admissions 10/07/18    Comment: [facility] chronic care management coordinator  Elayne Snare, MD Consulting Physician Endocrinology  07/06/19      Patient Active Problem List   Diagnosis Date Noted  . Type II diabetes mellitus, uncontrolled ([place]) 08/20/2014  . Diabetic polyneuropathy ([place]) 08/14/2013  . Hypertension associated with diabetes ([place]) 06/14/2013  . Hyperlipidemia associated with type 2 diabetes mellitus ([place]) 06/14/2013  . Acquired autoimmune  hypothyroidism 08/20/2014  . Osteoporosis 11/20/2013  . Anemia 12/13/2019  . Widowed 07/09/2019  . Family history of diabetes mellitus in brother 07/09/2019  . Tremulousness of voice 07/09/2019  . Family history of colon cancer- in her brother 07/09/2019    Past Medical history, Surgical history, Family history, Social history, Allergies and Medications have been entered into the medical record, reviewed and changed as needed.    Current Meds  Medication Sig  . Alcohol Swabs (ALCOHOL PADS) 70 % PADS USE 4 PER DAY  . Ascorbic Acid (VITAMIN C) 100 MG tablet Take 500 mg by mouth  daily.   . aspirin 81 MG tablet Take 81 mg by mouth daily.  . BD PEN NEEDLE NANO U/F 32G X 4 MM MISC USE AS DIRECTED FIVE TIMES A DAY.  . brimonidine (ALPHAGAN P) 0.1 % SOLN Place 1 drop into the left eye 2 (two) times daily.  . Continuous Blood Gluc Receiver (FREESTYLE LIBRE 14 DAY READER) DEVI 1 each by Does not apply route daily. USE DAILY TO CHECK BLOOD SUGAR WITH FREESTYLE LIBRE SENSOR.  . Continuous Blood Gluc Sensor (FREESTYLE LIBRE 14 DAY SENSOR) MISC APPLY TO ARM EVERY 14 DAYS TO MONITOR BLOOD SUGAR.  . cycloSPORINE (RESTASIS) 0.05 % ophthalmic emulsion 1 drop 2 (two) times daily.  . dorzolamidel-timolol (COSOPT) 22.3-6.8 MG/ML SOLN ophthalmic solution Place 1 drop into the left eye 2 (two) times daily.  . fish oil-omega-3 fatty acids 1000 MG capsule Take 2 g by mouth daily. Two a day  . gabapentin (NEURONTIN) 100 MG capsule TAKE 3 TO 4 CAPSULES BY MOUTH AT BEDTIME.  . insulin degludec (TRESIBA FLEXTOUCH) 100 UNIT/ML SOPN FlexTouch Pen Inject 0.11 mLs (11 Units total) into the skin daily. Inject 11 units under the skin once daily.  Marland Kitchen levothyroxine (SYNTHROID) 75 MCG tablet TAKE 1 TABLET BY MOUTH DAILY BEFORE BREAKFAST, EXCEPT TAKE 1 AND 1/2 TABLETS ON SUNDAY.  . LORazepam (ATIVAN) 0.5 MG tablet TAKE 1 TABLET BY MOUTH 2 TIMES DAILY AS NEEDED FOR ANXIETY.  Marland Kitchen losartan (COZAAR) 50 MG tablet TAKE 1 TABLET BY  MOUTH DAILY.  Marland Kitchen lovastatin (MEVACOR) 40 MG tablet TAKE 1 TABLET BY MOUTH ONCE DAILY WITH A MEAL.  . Multiple Vitamins-Minerals (PRESERVISION AREDS 2) CAPS Take by mouth.  Marland Kitchen NOVOLOG FLEXPEN 100 UNIT/ML FlexPen INJECT 3 TO 5 UNITS SUBCUTANEOUSLY BEFORE MEALS AS DIRECTED.  Marland Kitchen pantoprazole (PROTONIX) 40 MG tablet Take 40 mg by mouth 2 (two) times daily.  . sucralfate (CARAFATE) 1 g tablet Take 1 g by mouth 2 (two) times daily as needed.  . Vitamin D, Ergocalciferol, (DRISDOL) 50000 units CAPS capsule TAKE 1 CAPSULE BY MOUTH ONCE A MONTH.    Allergies:  Allergies  Allergen Reactions  . Macrodantin [Nitrofurantoin Macrocrystal] Swelling  . Prednisone     Make real "shaky-like, nervious"  . Penicillins Rash    Has patient had a PCN reaction causing immediate rash, facial/tongue/throat swelling, SOB or lightheadedness with hypotension:Yes Has patient had a PCN reaction causing severe rash involving mucus membranes or skin necrosis: No Has patient had a PCN reaction that required hospitalization: No Has patient had a PCN reaction occurring within the last 10 years:No If all of the above answers are "NO", then may proceed with Cephalosporin use.      Review of Systems:  A fourteen system review of systems was performed and found to be positive as per HPI.   Objective:   Blood pressure 135/69, pulse 69, temperature (!) 97.4 F (36.3 C), temperature source Oral, resp. rate 12, height 4' 10.25" (1.48 m), weight 133 lb 12.8 oz (60.7 kg), SpO2 99 %. Body mass index is 27.72 kg/m. General:  Well Developed, well nourished, appropriate for stated age.  Neuro:  Alert and oriented,  extra-ocular muscles intact  HEENT:  Normocephalic, atraumatic, neck supple, no carotid bruits appreciated  Skin:  no gross rash, warm, pink. Cardiac:  RRR, S1 S2 Respiratory:  ECTA B/L and A/P, Not using accessory muscles, speaking in full sentences- unlabored. Vascular:  2+ pitting edema.  Ext warm, no cyanosis  apprec.; cap RF less 2 sec. Psych:  No HI/SI, judgement and insight good, Euthymic mood. Full Affect.

## 2019-12-13 NOTE — Patient Instructions (Signed)

## 2019-12-14 LAB — TRANSFERRIN: Transferrin: 188 mg/dL (ref 149–313)

## 2019-12-14 LAB — IRON AND TIBC
Iron Saturation: 16 % (ref 15–55)
Iron: 35 ug/dL (ref 27–139)
Total Iron Binding Capacity: 218 ug/dL — ABNORMAL LOW (ref 250–450)
UIBC: 183 ug/dL (ref 118–369)

## 2019-12-14 LAB — CBC
Hematocrit: 34.9 % (ref 34.0–46.6)
Hemoglobin: 11.5 g/dL (ref 11.1–15.9)
MCH: 30.7 pg (ref 26.6–33.0)
MCHC: 33 g/dL (ref 31.5–35.7)
MCV: 93 fL (ref 79–97)
Platelets: 285 10*3/uL (ref 150–450)
RBC: 3.75 x10E6/uL — ABNORMAL LOW (ref 3.77–5.28)
RDW: 12.9 % (ref 11.7–15.4)
WBC: 7.1 10*3/uL (ref 3.4–10.8)

## 2019-12-14 LAB — VITAMIN B12: Vitamin B-12: 758 pg/mL (ref 232–1245)

## 2019-12-14 LAB — RETICULOCYTES: Retic Ct Pct: 0.9 % (ref 0.6–2.6)

## 2019-12-14 LAB — FERRITIN: Ferritin: 185 ng/mL — ABNORMAL HIGH (ref 15–150)

## 2019-12-14 LAB — FOLATE: Folate: >20 ng/mL (ref >3.0)

## 2019-12-18 ENCOUNTER — Ambulatory Visit (INDEPENDENT_AMBULATORY_CARE_PROVIDER_SITE_OTHER): Payer: HMO | Admitting: Endocrinology

## 2019-12-18 ENCOUNTER — Encounter: Payer: Self-pay | Admitting: Endocrinology

## 2019-12-18 VITALS — BP 130/60 | HR 75 | Ht 58.25 in | Wt 136.8 lb

## 2019-12-18 DIAGNOSIS — E1165 Type 2 diabetes mellitus with hyperglycemia: Secondary | ICD-10-CM

## 2019-12-18 DIAGNOSIS — E7800 Pure hypercholesterolemia, unspecified: Secondary | ICD-10-CM

## 2019-12-18 DIAGNOSIS — Z794 Long term (current) use of insulin: Secondary | ICD-10-CM | POA: Diagnosis not present

## 2019-12-18 DIAGNOSIS — I10 Essential (primary) hypertension: Secondary | ICD-10-CM | POA: Diagnosis not present

## 2019-12-18 DIAGNOSIS — E063 Autoimmune thyroiditis: Secondary | ICD-10-CM

## 2019-12-18 NOTE — Progress Notes (Signed)
Patient ID: Jennifer Bailey, female   DOB: 1935-08-10, 85 y.o.   MRN: [id number]   Chief complaint: follow up    History of Present Illness     Type 2 DIABETES MELITUS, date of diagnosis: 2003  She has been on insulin for several years but continues to have labile blood sugars despite using basal bolus regimen  She is very compliant with her insulin at mealtimes  Is taking small amounts of insulin and is not on any oral hypoglycemics She was changed from Levemir to [place] insulin in 1/17 because of relatively inconsistent control   RECENT history:   Insulin regimen: Tresiba 11 units in a.m.;   mealtime NovoLog coverage: At breakfast:  3 units, 4  lunch and 4 at supper     Current blood sugar patterns, diabetes management and problems identified:  Her [name] is 7.5, previously also 7.5  Daily management, problems identified and abnormal patterns are discussed in detail in the CGM analysis    She is showing significant variability of her blood sugars as usual  This is mostly between lunch and dinner  Although fasting readings are mostly high she does have occasional is as low as 114 in the morning  Also has slight dawn phenomena  She may have a tendency to low normal readings before lunchtime  Usually tries to have a snack if blood sugars are getting lower  She is not doing a lot of formal exercise recently and mostly just walking  As before postprandial readings are not always predictable based on her diet  She says that occasionally when eating out her blood sugar may go up more than expected  Usually avoiding high-fat foods  Her weight has gone up compared to last year  Monitors blood glucose:  Usually up to 4 times a day.    Glucometer:  Freestyle libre         CONTINUOUS GLUCOSE MONITORING RECORD INTERPRETATION    Dates of Recording: 3/2 through 3/15  Sensor description: Elenor Legato  Results statistics:   CGM use % of time  97  Average and SD   159, GV 30  Time in range     70   %  % Time Above 180  23  % Time above 250 5  % Time Below target 2    PRE-MEAL Fasting Lunch Dinner Bedtime Overall  Glucose range:       Mean/median:  158  132  161  167    POST-MEAL PC Breakfast PC Lunch PC Dinner  Glucose range:     Mean/median:  186  158  162    Glycemic patterns summary: The freestyle libre blood sugar was slightly higher than the actual lab blood sugar done on 3/8. Blood sugars are on an average within the target range with highest readings between 6-8 AM Lowest readings are about 12 noon She does not have any consistent periods of hyperglycemia  Hyperglycemic episodes are occurring as above overnight, sometimes after lunch and rarely after dinner  Hypoglycemic episodes occurred randomly at late morning, before lunch and afternoon hours but usually not below 60  Overnight periods: Blood sugars are fairly stable averaging about 150+ and tend to increase after 5 AM  Preprandial periods:    Blood sugar readings are as above on an average Frequently blood sugars are modestly higher at breakfast, low normal or low at lunchtime and variably high at dinnertime After lunch:   Postprandial periods:   After breakfast: Blood  sugars are mildly higher right after eating but tend to get lower progressively by lunchtime After lunch blood sugars are only occasionally going up in the afternoon to over 180 and occasional rebounding from low normal readings  After dinner: Blood sugars are mostly level compared to bring in readings with only 1 or 2 high episodes    Symptoms with hypoglycemia: feels weak and shaky Low sugars are treated with Soft drink or glucose tablets      Meals: 3 meals per day.  Usually has small portions. Eggs/bread or one pancake with protein at 7 am; has mid morning snack.  Has hs snack if blood sugar not high Evening meal usually balanced at 5 pm with 1-2 carbohydrates  Physical activity: exercise: Some  walking and yardwork         Dietician visit: Most recent: 2004            Wt Readings from Last 3 Encounters:  12/18/19 136 lb 12.8 oz (62.1 kg)  12/13/19 133 lb 12.8 oz (60.7 kg)  08/14/19 128 lb 9.6 oz ([id number] kg)   Complications: are: Mild neuropathy controlled symptomatically  Lab Results  Component Value Date   HGBA1C 7.5 (H) 12/11/2019   HGBA1C 7.5 (H) 08/07/2019   HGBA1C 7.1 (H) 05/08/2019   Lab Results  Component Value Date   MICROALBUR <0.7 05/08/2019   LDLCALC 62 05/08/2019   CREATININE 0.76 12/11/2019    HYPERTENSION:  this has been mild and treated with 50 mg losartan.  She is checking her blood pressure at home and 130-140/60  No lightheadedness   BP Readings from Last 3 Encounters:  12/18/19 130/60  12/13/19 135/69  08/14/19 130/70    OSTEOPENIA:  She has had osteopenia with T score -2.3 at the hip in 3/09 and was started on Fosamax at that time. This was stopped in 02/2014.   No history of fracture  follow-up bone density in 2016: DualFemur Neck Right 10/03/2015 T score -1.9   History of vitamin D Deficiency, she is taking 50,000 units monthly with adequate levels  Lab Results  Component Value Date   VD25OH 60.16 02/06/2019   VD25OH 64.10 07/11/2018    OTHER active problems: See review of systems   LABS:    Office Visit on 12/13/2019  Component Date Value Ref Range Status  . Vitamin B-12 12/13/2019 758  232 - 1,245 pg/mL Final  . Folate 12/13/2019 >20.0  >3.0 ng/mL Final   Comment: A serum folate concentration of less than 3.1 ng/mL is considered to represent clinical deficiency.   . Total Iron Binding Capacity 12/13/2019 218* 250 - 450 ug/dL Final  . UIBC 12/13/2019 183  118 - 369 ug/dL Final  . Iron 12/13/2019 35  27 - 139 ug/dL Final  . Iron Saturation 12/13/2019 16  15 - 55 % Final  . Ferritin 12/13/2019 185* 15 - 150 ng/mL Final  . Retic Ct Pct 12/13/2019 0.9  0.6 - 2.6 % Final  . Transferrin 12/13/2019 188  149 - 313  mg/dL Final  . WBC 12/13/2019 7.1  3.4 - 10.8 x10E3/uL Final  . RBC 12/13/2019 3.75* 3.77 - 5.28 x10E6/uL Final  . Hemoglobin 12/13/2019 11.5  11.1 - 15.9 g/dL Final  . Hematocrit 12/13/2019 34.9  34.0 - 46.6 % Final  . MCV 12/13/2019 93  79 - 97 fL Final  . MCH 12/13/2019 30.7  26.6 - 33.0 pg Final  . MCHC 12/13/2019 33.0  31.5 - 35.7 g/dL Final  . RDW  12/13/2019 12.9  11.7 - 15.4 % Final  . Platelets 12/13/2019 285  150 - 450 x10E3/uL Final    Allergies as of 12/18/2019      Reactions   Macrodantin [nitrofurantoin Macrocrystal] Swelling   Prednisone    Make real "shaky-like, nervious"   Penicillins Rash   Has patient had a PCN reaction causing immediate rash, facial/tongue/throat swelling, SOB or lightheadedness with hypotension:Yes Has patient had a PCN reaction causing severe rash involving mucus membranes or skin necrosis: No Has patient had a PCN reaction that required hospitalization: No Has patient had a PCN reaction occurring within the last 10 years:No If all of the above answers are "NO", then may proceed with Cephalosporin use.      Medication List       Accurate as of December 18, 2019  3:10 PM. If you have any questions, ask your nurse or doctor.        Alcohol Pads 70 % Pads USE 4 PER DAY   Alphagan P 0.1 % Soln Generic drug: brimonidine Place 1 drop into the left eye 2 (two) times daily.   aspirin 81 MG tablet Take 81 mg by mouth daily.   BD Pen Needle Nano U/F 32G X 4 MM Misc Generic drug: Insulin Pen Needle USE AS DIRECTED FIVE TIMES A DAY.   cycloSPORINE 0.05 % ophthalmic emulsion Commonly known as: RESTASIS 1 drop 2 (two) times daily.   dorzolamidel-timolol 22.3-6.8 MG/ML Soln ophthalmic solution Commonly known as: COSOPT Place 1 drop into the left eye 2 (two) times daily.   fish oil-omega-3 fatty acids 1000 MG capsule Take 2 g by mouth daily. Two a day   FreeStyle Libre 14 Day Reader Kerrin Mo 1 each by Does not apply route daily. USE DAILY TO  CHECK BLOOD SUGAR WITH FREESTYLE LIBRE SENSOR.   FreeStyle Libre 14 Day Sensor Misc APPLY TO ARM EVERY 14 DAYS TO MONITOR BLOOD SUGAR.   gabapentin 100 MG capsule Commonly known as: NEURONTIN TAKE 3 TO 4 CAPSULES BY MOUTH AT BEDTIME.   levothyroxine 75 MCG tablet Commonly known as: SYNTHROID TAKE 1 TABLET BY MOUTH DAILY BEFORE BREAKFAST, EXCEPT TAKE 1 AND 1/2 TABLETS ON SUNDAY.   LORazepam 0.5 MG tablet Commonly known as: ATIVAN TAKE 1 TABLET BY MOUTH 2 TIMES DAILY AS NEEDED FOR ANXIETY.   losartan 50 MG tablet Commonly known as: COZAAR TAKE 1 TABLET BY MOUTH DAILY.   lovastatin 40 MG tablet Commonly known as: MEVACOR TAKE 1 TABLET BY MOUTH ONCE DAILY WITH A MEAL.   NovoLOG FlexPen 100 UNIT/ML FlexPen Generic drug: insulin aspart INJECT 3 TO 5 UNITS SUBCUTANEOUSLY BEFORE MEALS AS DIRECTED.   pantoprazole 40 MG tablet Commonly known as: PROTONIX Take 40 mg by mouth 2 (two) times daily.   PreserVision AREDS 2 Caps Take by mouth.   sucralfate 1 g tablet Commonly known as: CARAFATE Take 1 g by mouth 2 (two) times daily as needed.   Tyler Aas FlexTouch 100 UNIT/ML FlexTouch Pen Generic drug: insulin degludec Inject 0.11 mLs (11 Units total) into the skin daily. Inject 11 units under the skin once daily.   vitamin C 100 MG tablet Take 500 mg by mouth daily.   Vitamin D (Ergocalciferol) 1.25 MG (50000 UNIT) Caps capsule Commonly known as: DRISDOL TAKE 1 CAPSULE BY MOUTH ONCE A MONTH.       Allergies:  Allergies  Allergen Reactions  . Macrodantin [Nitrofurantoin Macrocrystal] Swelling  . Prednisone     Make real "shaky-like, nervious"  .  Penicillins Rash    Has patient had a PCN reaction causing immediate rash, facial/tongue/throat swelling, SOB or lightheadedness with hypotension:Yes Has patient had a PCN reaction causing severe rash involving mucus membranes or skin necrosis: No Has patient had a PCN reaction that required hospitalization: No Has patient had a  PCN reaction occurring within the last 10 years:No If all of the above answers are "NO", then may proceed with Cephalosporin use.     Past Medical History:  Diagnosis Date  . Diabetes mellitus without complication ([place])   . Thyroid disease     Past Surgical History:  Procedure Laterality Date  . ABDOMINAL HYSTERECTOMY    . CHOLECYSTECTOMY    . COLON SURGERY      Family History  Problem Relation Age of Onset  . Cancer Mother   . Diabetes Brother     Social History:  reports that she has never smoked. She has never used smokeless tobacco. She reports that she does not drink alcohol or use drugs.   Review of Systems   NEUROPATHY: She has stable control of her symptoms with gabapentin at night  Last colonoscopy showed only hemorrhoids in 7/15   ANEMIA: Her hemoglobin is usually low normal  B12 normal and iron level is normal -  Now being followed by PCP   Lab Results  Component Value Date   WBC 7.1 12/13/2019   HGB 11.5 12/13/2019   HCT 34.9 12/13/2019   MCV 93 12/13/2019   PLT 285 12/13/2019    HYPOTHYROIDISM: She has had primary hypothyroidism, long-standing . Her TSH levels have been stable with 75 mcg levothyroxine 7.5 tablets per week, has been on this dose since 11/15   Lab Results  Component Value Date   FREET4 1.24 08/07/2019   FREET4 1.14 10/10/2018   FREET4 1.01 07/11/2018   TSH 1.08 12/11/2019   TSH 1.23 08/07/2019   TSH 1.08 10/10/2018     HYPERLIPIDEMIA: The lipid abnormality consists of elevated LDL,  taking lovastatin with consistently good control, last level below 70.  Lab Results  Component Value Date   CHOL 135 05/08/2019   HDL 51.50 05/08/2019   LDLCALC 62 05/08/2019   TRIG 106.0 05/08/2019   CHOLHDL 3 05/08/2019        Examination:   BP 130/60 (BP Location: Left Arm, Patient Position: Sitting, Cuff Size: Normal)   Pulse 75   Ht 4' 10.25" (1.48 m)   Wt 136 lb 12.8 oz (62.1 kg)   SpO2 98%   BMI 28.35 kg/m   Body mass  index is 28.35 kg/m.   Diabetic Foot Exam - Simple   Simple Foot Form Diabetic Foot exam was performed with the following findings: Yes 12/18/2019 11:00 AM  Visual Inspection No deformities, no ulcerations, no other skin breakdown bilaterally: Yes Sensation Testing Intact to touch and monofilament testing bilaterally: Yes Pulse Check See comments: Yes Comments Pedal pulses decreased, not palpable posterior tibialis, dorsalis pedal pulses better felt proximally and decreased on The right        ASSESSMENT/ PLAN:     Diabetes type 2 on insulin   See history of present illness for detailed discussion of current management, blood sugar patterns and problems identified  Her [name] is still fairly good at 7.5  She is on basal bolus insulin Still requiring relatively low doses of insulin overall Although she has high readings overnight some morning readings are near 100 and considering her age cannot increase her basal insulin as yet Discussed  diet patterns, effects of various foods on her blood sugars but no consistent pattern is seen She does have higher readings after eating carbohydrates like bread Lowest blood sugars are before lunch and may be partly related to being more active  Discussed what she can use to treat lower sugars She will also try to continue having a snack if blood sugars are trending lower She is continuing to check her blood sugars several times a day to keep up with her readings  She can be eligible for the freestyle libre version in a few months once this is approved on Medicare, not 3 years as yet for her libre prescription  Hypertension: This  Her blood pressure is well-controlled She will continue 50 mg of losartan Discussed that she can check her blood pressure twice a week at home and not needed every day   Hypothyroidism :her TSH is normal and she will continue the 75 mcg dose  ANEMIA: This is improved and no deficiency found  Continue  vitamin D supplementation as before    Follow-up in 4 months     There are no Patient Instructions on file for this visit.    Elayne Snare 12/18/2019, 3:10 PM

## 2020-02-12 ENCOUNTER — Other Ambulatory Visit: Payer: Self-pay | Admitting: Endocrinology

## 2020-03-28 DIAGNOSIS — H0102B Squamous blepharitis left eye, upper and lower eyelids: Secondary | ICD-10-CM | POA: Diagnosis not present

## 2020-03-28 DIAGNOSIS — H353131 Nonexudative age-related macular degeneration, bilateral, early dry stage: Secondary | ICD-10-CM | POA: Diagnosis not present

## 2020-03-28 DIAGNOSIS — Z794 Long term (current) use of insulin: Secondary | ICD-10-CM | POA: Diagnosis not present

## 2020-03-28 DIAGNOSIS — H04123 Dry eye syndrome of bilateral lacrimal glands: Secondary | ICD-10-CM | POA: Diagnosis not present

## 2020-03-28 DIAGNOSIS — H18413 Arcus senilis, bilateral: Secondary | ICD-10-CM | POA: Diagnosis not present

## 2020-03-28 DIAGNOSIS — H401221 Low-tension glaucoma, left eye, mild stage: Secondary | ICD-10-CM | POA: Diagnosis not present

## 2020-03-28 DIAGNOSIS — H40011 Open angle with borderline findings, low risk, right eye: Secondary | ICD-10-CM | POA: Diagnosis not present

## 2020-03-28 DIAGNOSIS — E119 Type 2 diabetes mellitus without complications: Secondary | ICD-10-CM | POA: Diagnosis not present

## 2020-03-28 DIAGNOSIS — H0102A Squamous blepharitis right eye, upper and lower eyelids: Secondary | ICD-10-CM | POA: Diagnosis not present

## 2020-04-11 ENCOUNTER — Other Ambulatory Visit: Payer: Self-pay | Admitting: Physician Assistant

## 2020-04-11 ENCOUNTER — Other Ambulatory Visit: Payer: Self-pay | Admitting: Adult Health

## 2020-04-11 DIAGNOSIS — Z1231 Encounter for screening mammogram for malignant neoplasm of breast: Secondary | ICD-10-CM

## 2020-04-15 ENCOUNTER — Other Ambulatory Visit (INDEPENDENT_AMBULATORY_CARE_PROVIDER_SITE_OTHER): Payer: HMO

## 2020-04-15 ENCOUNTER — Ambulatory Visit: Payer: HMO | Admitting: Family Medicine

## 2020-04-15 ENCOUNTER — Other Ambulatory Visit: Payer: Self-pay

## 2020-04-15 DIAGNOSIS — E063 Autoimmune thyroiditis: Secondary | ICD-10-CM | POA: Diagnosis not present

## 2020-04-15 DIAGNOSIS — E7800 Pure hypercholesterolemia, unspecified: Secondary | ICD-10-CM | POA: Diagnosis not present

## 2020-04-15 DIAGNOSIS — Z794 Long term (current) use of insulin: Secondary | ICD-10-CM

## 2020-04-15 DIAGNOSIS — E1165 Type 2 diabetes mellitus with hyperglycemia: Secondary | ICD-10-CM

## 2020-04-15 LAB — COMPREHENSIVE METABOLIC PANEL
ALT: 15 U/L (ref 0–35)
AST: 21 U/L (ref 0–37)
Albumin: 4 g/dL (ref 3.5–5.2)
Alkaline Phosphatase: 70 U/L (ref 39–117)
BUN: 14 mg/dL (ref 6–23)
CO2: 27 mEq/L (ref 19–32)
Calcium: 9.2 mg/dL (ref 8.4–10.5)
Chloride: 103 mEq/L (ref 96–112)
Creatinine, Ser: 0.83 mg/dL (ref 0.40–1.20)
GFR: 65.68 mL/min (ref >60.00)
Glucose, Bld: 128 mg/dL — ABNORMAL HIGH (ref 70–99)
Potassium: 4 mEq/L (ref 3.5–5.1)
Sodium: 137 mEq/L (ref 135–145)
Total Bilirubin: 0.5 mg/dL (ref 0.2–1.2)
Total Protein: 6.5 g/dL (ref 6.0–8.3)

## 2020-04-15 LAB — LIPID PANEL
Cholesterol: 134 mg/dL (ref 0–200)
HDL: 50 mg/dL (ref >39.00)
LDL Cholesterol: 58 mg/dL (ref 0–99)
NonHDL: 83.7
Total CHOL/HDL Ratio: 3
Triglycerides: 129 mg/dL (ref 0.0–149.0)
VLDL: 25.8 mg/dL (ref 0.0–40.0)

## 2020-04-15 LAB — URINALYSIS, ROUTINE W REFLEX MICROSCOPIC
Bilirubin Urine: NEGATIVE
Hgb urine dipstick: NEGATIVE
Ketones, ur: NEGATIVE
Leukocytes,Ua: NEGATIVE
Nitrite: NEGATIVE
RBC / HPF: NONE SEEN (ref 0–?)
Specific Gravity, Urine: <=1.005 — AB (ref 1.000–1.030)
Total Protein, Urine: NEGATIVE
Urine Glucose: NEGATIVE
Urobilinogen, UA: 0.2 (ref 0.0–1.0)
WBC, UA: NONE SEEN (ref 0–?)
pH: 6.5 (ref 5.0–8.0)

## 2020-04-15 LAB — MICROALBUMIN / CREATININE URINE RATIO
Creatinine,U: 27.2 mg/dL
Microalb Creat Ratio: 2.6 mg/g (ref 0.0–30.0)
Microalb, Ur: <0.7 mg/dL (ref 0.0–1.9)

## 2020-04-15 LAB — HEMOGLOBIN A1C: Hgb A1c MFr Bld: 7.7 % — ABNORMAL HIGH (ref 4.6–6.5)

## 2020-04-15 LAB — TSH: TSH: 3.64 u[IU]/mL (ref 0.35–4.50)

## 2020-04-15 LAB — T4, FREE: Free T4: 1.27 ng/dL (ref 0.60–1.60)

## 2020-04-16 ENCOUNTER — Ambulatory Visit
Admission: RE | Admit: 2020-04-16 | Discharge: 2020-04-16 | Disposition: A | Discharge status: Home / Self Care | Payer: HMO | Source: Ambulatory Visit | Attending: Adult Health | Admitting: Adult Health

## 2020-04-16 DIAGNOSIS — Z1231 Encounter for screening mammogram for malignant neoplasm of breast: Secondary | ICD-10-CM

## 2020-04-21 NOTE — Progress Notes (Signed)
Patient ID: Jennifer Bailey, female   DOB: 1934/12/12, 85 y.o.   MRN: [id number]   Chief complaint: follow up    History of Present Illness     Type 2 DIABETES MELITUS, date of diagnosis: 2003  She has been on insulin for several years but continues to have labile blood sugars despite using basal bolus regimen  She is very compliant with her insulin at mealtimes  Is taking small amounts of insulin and is not on any oral hypoglycemics She was changed from Levemir to [place] insulin in 1/17 because of relatively inconsistent control   RECENT history:   Insulin regimen: Tresiba 11 units in a.m.;   mealtime NovoLog coverage: At breakfast:  3 units, 4  lunch and 4 at supper                                   Current blood sugar patterns from freestyle libre, diabetes management and problems identified:   Daily management, problems identified and abnormal patterns are discussed in detail in the CGM analysis   Her [name] is slightly higher at 7.7 compared to 7.5   She is having fairly reasonable control of her blood sugars with moderate variability  She has a dawn phenomenon in the mornings frequently  Otherwise blood sugars as before tend to fluctuate postprandially including some low normal readings  She has a tendency to rebound after low sugars; this is partly because of her eating cookies along with drinking regular soft drinks to bring the blood sugar up.  Also when her blood sugars are relatively low she will wait till after eating to do her mealtime shot  More often has tendency to low sugars the last 5 days except yesterday  She thinks she has symptoms when her blood sugars are low although her freestyle libre may be slightly lower than actual reading  Appears to have lost a lot of weight, is trying to do some home exercises and also with walking  Usually her exercise which is after breakfast does not cause low sugars  Monitors blood glucose: Several times a  day    Glucometer:  Freestyle libre         CONTINUOUS GLUCOSE MONITORING RECORD INTERPRETATION    Dates of Recording: Last 2 weeks  Sensor description: Libre  Results statistics:   CGM use % of time  97  Average and SD 146, GV 31  Time in range      76%  % Time Above 180  21  % Time above 250   % Time Below target 3    PRE-MEAL Fasting Lunch Dinner Bedtime Overall  Glucose range:       Mean/median:  131  163  154  126  146   POST-MEAL PC Breakfast PC Lunch PC Dinner  Glucose range:     Mean/median:  167  155  157    Glycemic patterns summary: Her blood sugars are relatively lower between 11 PM and 5 AM and modestly higher the rest of the day with some fluctuation.  No consistent hyperglycemic patterns visible Also has sporadic transient low blood sugars at all times except after dinner Daily average blood sugar ranges between 129 and 181 Accuracy of the freestyle Elenor Legato is usually good although may be slightly lower than actual reading  Hyperglycemic episodes are occurring at variable times, most commonly after rebounding from low blood sugars rarely  overnight or after one of the meals  Hypoglycemic episodes occurred sporadically overnight, after breakfast or lunch  Overnight periods: Well-controlled on an average with some variability including low sugars in the morning once in high on another night.  Otherwise blood sugars usually start rising after about 5 AM  Preprandial periods: Blood sugars are mildly increased at breakfast time with some variability Blood sugars are mostly on an average higher around 150-160 at lunch and dinner  Postprandial periods:   Usually blood sugars are somewhat higher after breakfast but not consistently including some low, this may be partly from rebound blood sugars are low normal On an average blood sugars are the same or lower after eating lunch and dinner with moderate variability  Daily settings:    CGM use % of time  97    Average and SD  159, GV 30  Time in range     70   %  % Time Above 180  23  % Time above 250 5  % Time Below target 2    PRE-MEAL Fasting Lunch Dinner Bedtime Overall  Glucose range:       Mean/median:  158  132  161  167    POST-MEAL PC Breakfast PC Lunch PC Dinner  Glucose range:     Mean/median:  186  158  162    Glycemic patterns summary: The freestyle libre blood sugar was slightly higher than the actual lab blood sugar done on 3/8. Blood sugars are on an average within the target range with highest readings between 6-8 AM Lowest readings are about 12 noon She does not have any consistent periods of hyperglycemia    Symptoms with hypoglycemia: feels weak and shaky Low sugars are treated with Soft drink or glucose tablets      Meals: 3 meals per day.  Usually has small portions. Eggs/bread or one pancake with protein at 7 am; has mid morning snack.  Has hs snack if blood sugar not high Evening meal usually balanced at 5 pm with 1-2 carbohydrates  Physical activity: exercise: Some walking and yardwork         Dietician visit: Most recent: 2004            Wt Readings from Last 3 Encounters:  04/22/20 130 lb (59 kg)  12/18/19 136 lb 12.8 oz (62.1 kg)  12/13/19 133 lb 12.8 oz (16.1 kg)   Complications: are: Mild neuropathy controlled symptomatically  Lab Results  Component Value Date   HGBA1C 7.7 (H) 04/15/2020   HGBA1C 7.5 (H) 12/11/2019   HGBA1C 7.5 (H) 08/07/2019   Lab Results  Component Value Date   MICROALBUR <0.7 04/15/2020   LDLCALC 58 04/15/2020   CREATININE 0.83 04/15/2020    HYPERTENSION:  this has been mild and treated with 50 mg losartan.  She is checking her blood pressure at home and 130-140/60  No lightheadedness   BP Readings from Last 3 Encounters:  04/22/20 122/62  12/18/19 130/60  12/13/19 135/69    OSTEOPENIA:  She has had osteopenia with T score -2.3 at the hip in 3/09 and was started on Fosamax at that time. This was stopped in  02/2014.   No history of fracture  follow-up bone density in 2016: DualFemur Neck Right 10/03/2015 T score -1.9   History of vitamin D Deficiency, she is taking 50,000 units monthly with adequate levels  Lab Results  Component Value Date   VD25OH 60.16 02/06/2019   VD25OH 64.10 07/11/2018  OTHER active problems: See review of systems   LABS:    No visits with results within 1 Week(s) from this visit.  Latest known visit with results is:  Lab on 04/15/2020  Component Date Value Ref Range Status  . Free T4 04/15/2020 1.27  0.60 - 1.60 ng/dL Final   Comment: Specimens from patients who are undergoing biotin therapy and /or ingesting biotin supplements may contain high levels of biotin.  The higher biotin concentration in these specimens interferes with this Free T4 assay.  Specimens that contain high levels  of biotin may cause false high results for this Free T4 assay.  Please interpret results in light of the total clinical presentation of the patient.    Marland Kitchen TSH 04/15/2020 3.64  0.35 - 4.50 uIU/mL Final  . Color, Urine 04/15/2020 YELLOW  Yellow;Lt. Yellow;Straw;Dark Yellow;Amber;Green;Red;Brown Final  . APPearance 04/15/2020 CLEAR  Clear;Turbid;Slightly Cloudy;Cloudy Final  . Specific Gravity, Urine 04/15/2020 <=1.005* 1.000 - 1.030 Final  . pH 04/15/2020 6.5  5.0 - 8.0 Final  . Total Protein, Urine 04/15/2020 NEGATIVE  Negative Final  . Urine Glucose 04/15/2020 NEGATIVE  Negative Final  . Ketones, ur 04/15/2020 NEGATIVE  Negative Final  . Bilirubin Urine 04/15/2020 NEGATIVE  Negative Final  . Hgb urine dipstick 04/15/2020 NEGATIVE  Negative Final  . Urobilinogen, UA 04/15/2020 0.2  0.0 - 1.0 Final  . Leukocytes,Ua 04/15/2020 NEGATIVE  Negative Final  . Nitrite 04/15/2020 NEGATIVE  Negative Final  . WBC, UA 04/15/2020 none seen  0-2/hpf Final  . RBC / HPF 04/15/2020 none seen  0-2/hpf Final  . Squamous Epithelial / LPF 04/15/2020 Rare(0-4/hpf)  Rare(0-4/hpf) Final    . Microalb, Ur 04/15/2020 <0.7  0.0 - 1.9 mg/dL Final  . Creatinine,U 04/15/2020 27.2  mg/dL Final  . Microalb Creat Ratio 04/15/2020 2.6  0.0 - 30.0 mg/g Final  . Cholesterol 04/15/2020 134  0 - 200 mg/dL Final   ATP III Classification       Desirable:  < 200 mg/dL               Borderline High:  200 - 239 mg/dL          High:  > = 240 mg/dL  . Triglycerides 04/15/2020 129.0  0 - 149 mg/dL Final   Normal:  <150 mg/dLBorderline High:  150 - 199 mg/dL  . HDL 04/15/2020 50.00  >39.00 mg/dL Final  . VLDL 04/15/2020 25.8  0.0 - 40.0 mg/dL Final  . LDL Cholesterol 04/15/2020 58  0 - 99 mg/dL Final  . Total CHOL/HDL Ratio 04/15/2020 3   Final                  Men          Women1/2 Average Risk     3.4          3.3Average Risk          5.0          4.42X Average Risk          9.6          7.13X Average Risk          15.0          11.0                      . NonHDL 04/15/2020 83.70   Final   NOTE:  Non-HDL goal should be 30 mg/dL higher than patient's LDL goal (i.e. LDL  goal of < 70 mg/dL, would have non-HDL goal of < 100 mg/dL)  . Sodium 04/15/2020 137  135 - 145 mEq/L Final  . Potassium 04/15/2020 4.0  3.5 - 5.1 mEq/L Final  . Chloride 04/15/2020 103  96 - 112 mEq/L Final  . CO2 04/15/2020 27  19 - 32 mEq/L Final  . Glucose, Bld 04/15/2020 128* 70 - 99 mg/dL Final  . BUN 04/15/2020 14  6 - 23 mg/dL Final  . Creatinine, Ser 04/15/2020 0.83  0.40 - 1.20 mg/dL Final  . Total Bilirubin 04/15/2020 0.5  0.2 - 1.2 mg/dL Final  . Alkaline Phosphatase 04/15/2020 70  39 - 117 U/L Final  . AST 04/15/2020 21  0 - 37 U/L Final  . ALT 04/15/2020 15  0 - 35 U/L Final  . Total Protein 04/15/2020 6.5  6.0 - 8.3 g/dL Final  . Albumin 04/15/2020 4.0  3.5 - 5.2 g/dL Final  . GFR 04/15/2020 65.68  >60.00 mL/min Final  . Calcium 04/15/2020 9.2  8.4 - 10.5 mg/dL Final  . Hgb [name] MFr Bld 04/15/2020 7.7* 4.6 - 6.5 % Final   Glycemic Control Guidelines for People with Diabetes:Non Diabetic:  <6%Goal of Therapy:  <7%Additional Action Suggested:  >8%     Allergies as of 04/22/2020      Reactions   Macrodantin [nitrofurantoin Macrocrystal] Swelling   Prednisone    Make real "shaky-like, nervious"   Penicillins Rash   Has patient had a PCN reaction causing immediate rash, facial/tongue/throat swelling, SOB or lightheadedness with hypotension:Yes Has patient had a PCN reaction causing severe rash involving mucus membranes or skin necrosis: No Has patient had a PCN reaction that required hospitalization: No Has patient had a PCN reaction occurring within the last 10 years:No If all of the above answers are "NO", then may proceed with Cephalosporin use.      Medication List       Accurate as of April 22, 2020 10:05 AM. If you have any questions, ask your nurse or doctor.        Alcohol Pads 70 % Pads USE 4 PER DAY   Alphagan P 0.1 % Soln Generic drug: brimonidine Place 1 drop into the left eye 2 (two) times daily.   aspirin 81 MG tablet Take 81 mg by mouth daily.   BD Pen Needle Nano U/F 32G X 4 MM Misc Generic drug: Insulin Pen Needle USE AS DIRECTED FIVE TIMES A DAY.   cycloSPORINE 0.05 % ophthalmic emulsion Commonly known as: RESTASIS 1 drop 2 (two) times daily.   dorzolamidel-timolol 22.3-6.8 MG/ML Soln ophthalmic solution Commonly known as: COSOPT Place 1 drop into the left eye 2 (two) times daily.   fish oil-omega-3 fatty acids 1000 MG capsule Take 2 g by mouth daily. Two a day   FreeStyle Libre 14 Day Reader Kerrin Mo 1 each by Does not apply route daily. USE DAILY TO CHECK BLOOD SUGAR WITH FREESTYLE LIBRE SENSOR.   FreeStyle Libre 14 Day Sensor Misc APPLY TO ARM EVERY 14 DAYS TO MONITOR BLOOD SUGAR.   gabapentin 100 MG capsule Commonly known as: NEURONTIN TAKE 3 TO 4 CAPSULES BY MOUTH AT BEDTIME.   levothyroxine 75 MCG tablet Commonly known as: SYNTHROID TAKE 1 TABLET BY MOUTH DAILY BEFORE BREAKFAST, EXCEPT TAKE 1 AND 1/2 TABLETS ON SUNDAY.   LORazepam 0.5 MG  tablet Commonly known as: ATIVAN TAKE 1 TABLET BY MOUTH 2 TIMES DAILY AS NEEDED FOR ANXIETY.   losartan 50 MG tablet Commonly known as:  COZAAR TAKE 1 TABLET BY MOUTH DAILY.   lovastatin 40 MG tablet Commonly known as: MEVACOR TAKE 1 TABLET BY MOUTH ONCE DAILY WITH A MEAL.   NovoLOG FlexPen 100 UNIT/ML FlexPen Generic drug: insulin aspart INJECT 3 TO 5 UNITS SUBCUTANEOUSLY BEFORE MEALS AS DIRECTED.   pantoprazole 40 MG tablet Commonly known as: PROTONIX Take 40 mg by mouth 2 (two) times daily.   PreserVision AREDS 2 Caps Take by mouth.   sucralfate 1 g tablet Commonly known as: CARAFATE Take 1 g by mouth 2 (two) times daily as needed.   Tyler Aas FlexTouch 100 UNIT/ML FlexTouch Pen Generic drug: insulin degludec Inject 0.11 mLs (11 Units total) into the skin daily. Inject 11 units under the skin once daily.   vitamin C 100 MG tablet Take 500 mg by mouth daily.   Vitamin D (Ergocalciferol) 1.25 MG (50000 UNIT) Caps capsule Commonly known as: DRISDOL TAKE 1 CAPSULE BY MOUTH ONCE A MONTH.       Allergies:  Allergies  Allergen Reactions  . Macrodantin [Nitrofurantoin Macrocrystal] Swelling  . Prednisone     Make real "shaky-like, nervious"  . Penicillins Rash    Has patient had a PCN reaction causing immediate rash, facial/tongue/throat swelling, SOB or lightheadedness with hypotension:Yes Has patient had a PCN reaction causing severe rash involving mucus membranes or skin necrosis: No Has patient had a PCN reaction that required hospitalization: No Has patient had a PCN reaction occurring within the last 10 years:No If all of the above answers are "NO", then may proceed with Cephalosporin use.     Past Medical History:  Diagnosis Date  . Diabetes mellitus without complication ([place])   . Thyroid disease     Past Surgical History:  Procedure Laterality Date  . ABDOMINAL HYSTERECTOMY    . CHOLECYSTECTOMY    . COLON SURGERY      Family History  Problem  Relation Age of Onset  . Cancer Mother   . Diabetes Brother     Social History:  reports that she has never smoked. She has never used smokeless tobacco. She reports that she does not drink alcohol and does not use drugs.   Review of Systems   NEUROPATHY: She has stable control of her symptoms with gabapentin at night  Monitoring: Last done in 2016 with osteopenia, has not been able to get it scheduled as yet  ANEMIA: Her hemoglobin is usually low normal  B12 normal and iron level is normal -evaluated by PCP   Lab Results  Component Value Date   WBC 7.1 12/13/2019   HGB 11.5 12/13/2019   HCT 34.9 12/13/2019   MCV 93 12/13/2019   PLT 285 12/13/2019    HYPOTHYROIDISM: She has had primary hypothyroidism, long-standing . Her TSH levels have been stable with 75 mcg levothyroxine 7.5 tablets per week, has been on this dose since 11/15   Lab Results  Component Value Date   FREET4 1.27 04/15/2020   FREET4 1.24 08/07/2019   FREET4 1.14 10/10/2018   TSH 3.64 04/15/2020   TSH 1.08 12/11/2019   TSH 1.23 08/07/2019     HYPERLIPIDEMIA: The lipid abnormality consists of elevated LDL,  taking lovastatin with consistently good control, last level below 70.  Lab Results  Component Value Date   CHOL 134 04/15/2020   HDL 50.00 04/15/2020   LDLCALC 58 04/15/2020   TRIG 129.0 04/15/2020   CHOLHDL 3 04/15/2020        Examination:   BP 122/62 (BP Location: Left Arm,  Patient Position: Sitting, Cuff Size: Normal)   Pulse 61   Ht 4' 10.25" (1.48 m)   Wt 130 lb (59 kg)   SpO2 95%   BMI 26.94 kg/m   Body mass index is 26.94 kg/m.      ASSESSMENT/ PLAN:     Diabetes type 2 on insulin   See history of present illness for detailed discussion of current management, blood sugar patterns and problems identified  Her [name] is still fairly good at 7.5  She is on basal bolus insulin  Discussed appropriate treatment of hypoglycemia, making sure she takes her mealtime insulin  before eating, blood sugar targets and dawn phenomena.  Reviewed her CGM download and discussed what is affecting her sugar control as well as causes for low and high sugars Since she is recently tending to have low sugars will reduce her Tyler Aas down to 10  Hypertension:  Her blood pressure is consistentlycontrolled She will continue 50 mg of losartan Periodically will monitor at home   Hypothyroidism :her TSH is normal and she will continue the 75 mcg prescription as before  She can get the Shingrix vaccine at the pharmacy  Needs bone density and she will call if she cannot get it done at the breast center  Follow-up in 4 months     Patient Instructions  Tresiba 10 units     Elayne Snare 04/22/2020, 10:05 AM

## 2020-04-22 ENCOUNTER — Encounter: Payer: Self-pay | Admitting: Physician Assistant

## 2020-04-22 ENCOUNTER — Encounter: Payer: Self-pay | Admitting: Endocrinology

## 2020-04-22 ENCOUNTER — Ambulatory Visit (INDEPENDENT_AMBULATORY_CARE_PROVIDER_SITE_OTHER): Payer: HMO | Admitting: Endocrinology

## 2020-04-22 ENCOUNTER — Ambulatory Visit (INDEPENDENT_AMBULATORY_CARE_PROVIDER_SITE_OTHER): Payer: HMO | Admitting: Physician Assistant

## 2020-04-22 ENCOUNTER — Other Ambulatory Visit: Payer: Self-pay

## 2020-04-22 VITALS — BP 144/78 | HR 61 | Temp 97.6°F | Ht 58.25 in | Wt 138.2 lb

## 2020-04-22 VITALS — BP 122/62 | HR 61 | Ht 58.25 in | Wt 130.0 lb

## 2020-04-22 DIAGNOSIS — E1165 Type 2 diabetes mellitus with hyperglycemia: Secondary | ICD-10-CM

## 2020-04-22 DIAGNOSIS — Z794 Long term (current) use of insulin: Secondary | ICD-10-CM

## 2020-04-22 DIAGNOSIS — E1169 Type 2 diabetes mellitus with other specified complication: Secondary | ICD-10-CM

## 2020-04-22 DIAGNOSIS — E7800 Pure hypercholesterolemia, unspecified: Secondary | ICD-10-CM

## 2020-04-22 DIAGNOSIS — E785 Hyperlipidemia, unspecified: Secondary | ICD-10-CM

## 2020-04-22 DIAGNOSIS — I152 Hypertension secondary to endocrine disorders: Secondary | ICD-10-CM

## 2020-04-22 DIAGNOSIS — E1159 Type 2 diabetes mellitus with other circulatory complications: Secondary | ICD-10-CM | POA: Diagnosis not present

## 2020-04-22 DIAGNOSIS — E063 Autoimmune thyroiditis: Secondary | ICD-10-CM

## 2020-04-22 DIAGNOSIS — I10 Essential (primary) hypertension: Secondary | ICD-10-CM

## 2020-04-22 NOTE — Assessment & Plan Note (Addendum)
-   Most recent lipid panel wnl's. - Continue Lovastatin 40 mg. - Follow heart healthy diet.

## 2020-04-22 NOTE — Patient Instructions (Signed)
Tresiba 10 units

## 2020-04-22 NOTE — Patient Instructions (Signed)
DASH Eating Plan DASH stands for "Dietary Approaches to Stop Hypertension." The DASH eating plan is a healthy eating plan that has been shown to reduce high blood pressure (hypertension). It may also reduce your risk for type 2 diabetes, heart disease, and stroke. The DASH eating plan may also help with weight loss. What are tips for following this plan?  General guidelines  Avoid eating more than 2,300 mg (milligrams) of salt (sodium) a day. If you have hypertension, you may need to reduce your sodium intake to 1,500 mg a day.  Limit alcohol intake to no more than 1 drink a day for nonpregnant women and 2 drinks a day for men. One drink equals 12 oz of beer, 5 oz of wine, or 1 oz of hard liquor.  Work with your health care provider to maintain a healthy body weight or to lose weight. Ask what an ideal weight is for you.  Get at least 30 minutes of exercise that causes your heart to beat faster (aerobic exercise) most days of the week. Activities may include walking, swimming, or biking.  Work with your health care provider or diet and nutrition specialist (dietitian) to adjust your eating plan to your individual calorie needs. Reading food labels   Check food labels for the amount of sodium per serving. Choose foods with less than 5 percent of the Daily Value of sodium. Generally, foods with less than 300 mg of sodium per serving fit into this eating plan.  To find whole grains, look for the word "whole" as the first word in the ingredient list. Shopping  Buy products labeled as "low-sodium" or "no salt added."  Buy fresh foods. Avoid canned foods and premade or frozen meals. Cooking  Avoid adding salt when cooking. Use salt-free seasonings or herbs instead of table salt or sea salt. Check with your health care provider or pharmacist before using salt substitutes.  Do not fry foods. Cook foods using healthy methods such as baking, boiling, grilling, and broiling instead.  Cook with  heart-healthy oils, such as olive, canola, soybean, or sunflower oil. Meal planning  Eat a balanced diet that includes: ? 5 or more servings of fruits and vegetables each day. At each meal, try to fill half of your plate with fruits and vegetables. ? Up to 6-8 servings of whole grains each day. ? Less than 6 oz of lean meat, poultry, or fish each day. A 3-oz serving of meat is about the same size as a deck of cards. One egg equals 1 oz. ? 2 servings of low-fat dairy each day. ? A serving of nuts, seeds, or beans 5 times each week. ? Heart-healthy fats. Healthy fats called Omega-3 fatty acids are found in foods such as flaxseeds and coldwater fish, like sardines, salmon, and mackerel.  Limit how much you eat of the following: ? Canned or prepackaged foods. ? Food that is high in trans fat, such as fried foods. ? Food that is high in saturated fat, such as fatty meat. ? Sweets, desserts, sugary drinks, and other foods with added sugar. ? Full-fat dairy products.  Do not salt foods before eating.  Try to eat at least 2 vegetarian meals each week.  Eat more home-cooked food and less restaurant, buffet, and fast food.  When eating at a restaurant, ask that your food be prepared with less salt or no salt, if possible. What foods are recommended? The items listed may not be a complete list. Talk with your dietitian about   what dietary choices are best for you. Grains Whole-grain or whole-wheat bread. Whole-grain or whole-wheat pasta. Brown rice. Oatmeal. Quinoa. Bulgur. Whole-grain and low-sodium cereals. Pita bread. Low-fat, low-sodium crackers. Whole-wheat flour tortillas. Vegetables Fresh or frozen vegetables (raw, steamed, roasted, or grilled). Low-sodium or reduced-sodium tomato and vegetable juice. Low-sodium or reduced-sodium tomato sauce and tomato paste. Low-sodium or reduced-sodium canned vegetables. Fruits All fresh, dried, or frozen fruit. Canned fruit in natural juice (without  added sugar). Meat and other protein foods Skinless chicken or turkey. Ground chicken or turkey. Pork with fat trimmed off. Fish and seafood. Egg whites. Dried beans, peas, or lentils. Unsalted nuts, nut butters, and seeds. Unsalted canned beans. Lean cuts of beef with fat trimmed off. Low-sodium, lean deli meat. Dairy Low-fat (1%) or fat-free (skim) milk. Fat-free, low-fat, or reduced-fat cheeses. Nonfat, low-sodium ricotta or cottage cheese. Low-fat or nonfat yogurt. Low-fat, low-sodium cheese. Fats and oils Soft margarine without trans fats. Vegetable oil. Low-fat, reduced-fat, or light mayonnaise and salad dressings (reduced-sodium). Canola, safflower, olive, soybean, and sunflower oils. Avocado. Seasoning and other foods Herbs. Spices. Seasoning mixes without salt. Unsalted popcorn and pretzels. Fat-free sweets. What foods are not recommended? The items listed may not be a complete list. Talk with your dietitian about what dietary choices are best for you. Grains Baked goods made with fat, such as croissants, muffins, or some breads. Dry pasta or rice meal packs. Vegetables Creamed or fried vegetables. Vegetables in a cheese sauce. Regular canned vegetables (not low-sodium or reduced-sodium). Regular canned tomato sauce and paste (not low-sodium or reduced-sodium). Regular tomato and vegetable juice (not low-sodium or reduced-sodium). Pickles. Olives. Fruits Canned fruit in a light or heavy syrup. Fried fruit. Fruit in cream or butter sauce. Meat and other protein foods Fatty cuts of meat. Ribs. Fried meat. Bacon. Sausage. Bologna and other processed lunch meats. Salami. Fatback. Hotdogs. Bratwurst. Salted nuts and seeds. Canned beans with added salt. Canned or smoked fish. Whole eggs or egg yolks. Chicken or turkey with skin. Dairy Whole or 2% milk, cream, and half-and-half. Whole or full-fat cream cheese. Whole-fat or sweetened yogurt. Full-fat cheese. Nondairy creamers. Whipped toppings.  Processed cheese and cheese spreads. Fats and oils Butter. Stick margarine. Lard. Shortening. Ghee. Bacon fat. Tropical oils, such as coconut, palm kernel, or palm oil. Seasoning and other foods Salted popcorn and pretzels. Onion salt, garlic salt, seasoned salt, table salt, and sea salt. Worcestershire sauce. Tartar sauce. Barbecue sauce. Teriyaki sauce. Soy sauce, including reduced-sodium. Steak sauce. Canned and packaged gravies. Fish sauce. Oyster sauce. Cocktail sauce. Horseradish that you find on the shelf. Ketchup. Mustard. Meat flavorings and tenderizers. Bouillon cubes. Hot sauce and Tabasco sauce. Premade or packaged marinades. Premade or packaged taco seasonings. Relishes. Regular salad dressings. Where to find more information:  National Heart, Lung, and Blood Institute: www.nhlbi.nih.gov  American Heart Association: www.heart.org Summary  The DASH eating plan is a healthy eating plan that has been shown to reduce high blood pressure (hypertension). It may also reduce your risk for type 2 diabetes, heart disease, and stroke.  With the DASH eating plan, you should limit salt (sodium) intake to 2,300 mg a day. If you have hypertension, you may need to reduce your sodium intake to 1,500 mg a day.  When on the DASH eating plan, aim to eat more fresh fruits and vegetables, whole grains, lean proteins, low-fat dairy, and heart-healthy fats.  Work with your health care provider or diet and nutrition specialist (dietitian) to adjust your eating plan to your   individual calorie needs. This information is not intended to replace advice given to you by your health care provider. Make sure you discuss any questions you have with your health care provider. Document Revised: 09/03/2017 Document Reviewed: 09/14/2016 Elsevier Patient Education  2020 [employer]  

## 2020-04-22 NOTE — Assessment & Plan Note (Addendum)
-   BP today is 144/78 HR 61, stable. - Most recent CMP essentially WNL - Continue Losartan Potassium 50 mg. - Continue ambulatory BP and pulse monitoring and keep a log. Notify the clinic if BP consistently above 140/90. - Continue DASH diet. - Stay well hydrated, at least 64 fl oz - Encourage to stay as active as possible.

## 2020-04-22 NOTE — Assessment & Plan Note (Signed)
>>  ASSESSMENT AND PLAN FOR TYPE II DIABETES MELLITUS, UNCONTROLLED WRITTEN ON 04/22/2020  3:50 PM BY ABONZA, MARITZA, PA-C  -Most recent [name] 7.7, stable -Followed by endocrinology. -Continue NovoLog and Tyler Aas. -Continue ambulatory glucose monitoring.  -Follow low glucose/carbohydrate diet.

## 2020-04-22 NOTE — Assessment & Plan Note (Signed)
-  Most recent [name] 7.7, stable -Followed by endocrinology. -Continue NovoLog and Tyler Aas. -Continue ambulatory glucose monitoring.  -Follow low glucose/carbohydrate diet.

## 2020-04-22 NOTE — Progress Notes (Signed)
Established Patient Office Visit  Subjective:  Patient ID: Jennifer Bailey, female    DOB: Mar 26, 1935  Age: 85 y.o. MRN: [id number]  CC:  Chief Complaint  Patient presents with  . Jennifer Bailey  . Hypertension  . Diabetes    HPI PANHIA Jennifer Bailey presents for follow-up on diabetes mellitus with hyperglycemia, Jennifer Bailey, and hypertension. Most labs recently obtained essentially within normal limits or stable from prior.  Diabetes: Pt followed by endocrinology-Dr. Dwyane Dee. Pt denies increased urination or thirst. Pt reports medication compliance. Reports hypoglycemic events, and Dr. Dwyane Dee decreased Tyler Aas to 10 units. Checking glucose at home. FBS vary.  HTN: Denies new onset chest pain, palpitations, dizziness or lower extremity swelling. Taking medication as directed without side effects. Checks BP at home and brings and her BP log with readings ranging in 110s-130s/70s. Pt follows a low salt diet.  HLD: Pt taking medication as directed without issues. Denies side effects including myalgias and RUQ pain.    Past Medical History:  Diagnosis Date  . Diabetes mellitus without complication (Jennifer Bailey)   . Thyroid disease     Past Surgical History:  Procedure Laterality Date  . ABDOMINAL HYSTERECTOMY    . CHOLECYSTECTOMY    . COLON SURGERY      Family History  Problem Relation Age of Onset  . Cancer Mother   . Diabetes Brother     Social History   Socioeconomic History  . Marital status: Married    Spouse name: Not on file  . Number of children: Not on file  . Years of education: Not on file  . Highest education level: Not on file  Occupational History  . Not on file  Tobacco Use  . Smoking status: Never Smoker  . Smokeless tobacco: Never Used  Vaping Use  . Vaping Use: Never used  Substance and Sexual Activity  . Alcohol use: Never  . Drug use: Never  . Sexual activity: Not on file  Other Topics Concern  . Not on file  Social History Narrative  . Not on  file   Social Determinants of Health   Financial Resource Strain:   . Difficulty of Paying Living Expenses:   Food Insecurity: No Food Insecurity  . Worried About Charity fundraiser in the Last Year: Never true  . Ran Out of Food in the Last Year: Never true  Transportation Needs: No Transportation Needs  . Lack of Transportation (Medical): No  . Lack of Transportation (Non-Medical): No  Physical Activity:   . Days of Exercise per Week:   . Minutes of Exercise per Session:   Stress:   . Feeling of Stress :   Social Connections:   . Frequency of Communication with Friends and Family:   . Frequency of Social Gatherings with Friends and Family:   . Attends Religious Services:   . Active Member of Clubs or Organizations:   . Attends Archivist Meetings:   Marland Kitchen Marital Status:   Intimate Partner Violence:   . Fear of Current or Ex-Partner:   . Emotionally Abused:   Marland Kitchen Physically Abused:   . Sexually Abused:     Outpatient Medications Prior to Visit  Medication Sig Dispense Refill  . Alcohol Swabs (ALCOHOL PADS) 70 % PADS USE 4 PER DAY 400 each 3  . Ascorbic Acid (VITAMIN C) 100 MG tablet Take 500 mg by mouth daily.     Marland Kitchen aspirin 81 MG tablet Take 81 mg by mouth daily.    Marland Kitchen  BD PEN NEEDLE NANO U/F 32G X 4 MM MISC USE AS DIRECTED FIVE TIMES A DAY. 200 each 5  . brimonidine (ALPHAGAN P) 0.1 % SOLN Place 1 drop into the left eye 2 (two) times daily.    . Continuous Blood Gluc Receiver (FREESTYLE LIBRE 14 DAY READER) DEVI 1 each by Does not apply route daily. USE DAILY TO CHECK BLOOD SUGAR WITH FREESTYLE LIBRE SENSOR. 1 Device 0  . Continuous Blood Gluc Sensor (FREESTYLE LIBRE 14 DAY SENSOR) MISC APPLY TO ARM EVERY 14 DAYS TO MONITOR BLOOD SUGAR. 2 each 3  . cycloSPORINE (RESTASIS) 0.05 % ophthalmic emulsion 1 drop 2 (two) times daily.    . dorzolamidel-timolol (COSOPT) 22.3-6.8 MG/ML SOLN ophthalmic solution Place 1 drop into the left eye 2 (two) times daily.    . fish  oil-omega-3 fatty acids 1000 MG capsule Take 2 g by mouth daily. Two a day    . gabapentin (NEURONTIN) 100 MG capsule TAKE 3 TO 4 CAPSULES BY MOUTH AT BEDTIME. 120 capsule 5  . insulin degludec (TRESIBA FLEXTOUCH) 100 UNIT/ML SOPN FlexTouch Pen Inject 0.11 mLs (11 Units total) into the skin daily. Inject 11 units under the skin once daily. (Patient taking differently: Inject 10 Units into the skin daily. Inject 11 units under the skin once daily. ) 15 mL 2  . levothyroxine (SYNTHROID) 75 MCG tablet TAKE 1 TABLET BY MOUTH DAILY BEFORE BREAKFAST, EXCEPT TAKE 1 AND 1/2 TABLETS ON SUNDAY. 33 tablet 8  . losartan (COZAAR) 50 MG tablet TAKE 1 TABLET BY MOUTH DAILY. 90 tablet 2  . lovastatin (MEVACOR) 40 MG tablet TAKE 1 TABLET BY MOUTH ONCE DAILY WITH A MEAL. 90 tablet 4  . Multiple Vitamins-Minerals (PRESERVISION AREDS 2) CAPS Take by mouth.    Marland Kitchen NOVOLOG FLEXPEN 100 UNIT/ML FlexPen INJECT 3 TO 5 UNITS SUBCUTANEOUSLY BEFORE MEALS AS DIRECTED. 15 mL 3  . Omega-3 1000 MG CAPS Take 1 capsule by mouth in the morning, at noon, and at bedtime.    . pantoprazole (PROTONIX) 40 MG tablet Take 40 mg by mouth 2 (two) times daily.    . sucralfate (CARAFATE) 1 g tablet Take 1 g by mouth 2 (two) times daily as needed.    . Vitamin D, Ergocalciferol, (DRISDOL) 50000 units CAPS capsule TAKE 1 CAPSULE BY MOUTH ONCE A MONTH. 12 capsule 2  . LORazepam (ATIVAN) 0.5 MG tablet TAKE 1 TABLET BY MOUTH 2 TIMES DAILY AS NEEDED FOR ANXIETY. 15 tablet 0   No facility-administered medications prior to visit.    Allergies  Allergen Reactions  . Macrodantin [Nitrofurantoin Macrocrystal] Swelling  . Prednisone     Make real "shaky-like, nervious"  . Penicillins Rash    Has patient had a PCN reaction causing immediate rash, facial/tongue/throat swelling, SOB or lightheadedness with hypotension:Yes Has patient had a PCN reaction causing severe rash involving mucus membranes or skin necrosis: No Has patient had a PCN reaction that  required hospitalization: No Has patient had a PCN reaction occurring within the last 10 years:No If all of the above answers are "NO", then may proceed with Cephalosporin use.     ROS Review of Systems  A fourteen system review of systems was performed and found to be positive as per HPI.  Objective:    Physical Exam General: Well nourished, in no apparent distress. Eyes: PERRLA, EOMs, conjunctiva clr Resp: Respiratory effort- normal, ECTA B/L w/o W/R/R  Cardio: RRR S1 S2 without murmur Abdomen: no gross distention. Lymphatics:  less 2  sec cap RF M-sk: good ROM.  Skin: Warm, dry  Neuro: Alert, Oriented Psych: Normal affect, Insight and Judgment appropriate.   BP (!) 144/78   Pulse 61   Temp 97.6 F (36.4 C) (Oral)   Ht 4' 10.25" (1.48 m)   Wt 138 lb 3.2 oz (62.7 kg)   SpO2 98% Comment: on RA  BMI 28.64 kg/m  Wt Readings from Last 3 Encounters:  04/22/20 138 lb 3.2 oz (62.7 kg)  04/22/20 130 lb (59 kg)  12/18/19 136 lb 12.8 oz (62.1 kg)     There are no preventive care reminders to display for this patient.  There are no preventive care reminders to display for this patient.  Lab Results  Component Value Date   TSH 3.64 04/15/2020   Lab Results  Component Value Date   WBC 7.1 12/13/2019   HGB 11.5 12/13/2019   HCT 34.9 12/13/2019   MCV 93 12/13/2019   PLT 285 12/13/2019   Lab Results  Component Value Date   NA 137 04/15/2020   K 4.0 04/15/2020   CO2 27 04/15/2020   GLUCOSE 128 (H) 04/15/2020   BUN 14 04/15/2020   CREATININE 0.83 04/15/2020   BILITOT 0.5 04/15/2020   ALKPHOS 70 04/15/2020   AST 21 04/15/2020   ALT 15 04/15/2020   PROT 6.5 04/15/2020   ALBUMIN 4.0 04/15/2020   CALCIUM 9.2 04/15/2020   ANIONGAP 9 04/29/2018   GFR 65.68 04/15/2020   Lab Results  Component Value Date   CHOL 134 04/15/2020   Lab Results  Component Value Date   HDL 50.00 04/15/2020   Lab Results  Component Value Date   LDLCALC 58 04/15/2020   Lab Results   Component Value Date   TRIG 129.0 04/15/2020   Lab Results  Component Value Date   CHOLHDL 3 04/15/2020   Lab Results  Component Value Date   HGBA1C 7.7 (H) 04/15/2020      Assessment & Plan:   Problem List Items Addressed This Visit      Cardiovascular and Mediastinum   Hypertension associated with diabetes ([place])    - BP today is 144/78 HR 61, stable. - Most recent CMP essentially WNL - Continue Losartan Potassium 50 mg. - Continue ambulatory BP and pulse monitoring and keep a log. Notify the clinic if BP consistently above 140/90. - Continue DASH diet. - Stay well hydrated, at least 64 fl oz - Encourage to stay as active as possible.         Endocrine   Jennifer Bailey associated with type 2 diabetes mellitus ([place]) (Chronic)    - Most recent lipid panel wnl's. - Continue Lovastatin 40 mg. - Follow heart healthy diet.       Type II diabetes mellitus, uncontrolled ([place]) - Primary    -Most recent [name] 7.7, stable -Followed by endocrinology. -Continue NovoLog and Tyler Aas. -Continue ambulatory glucose monitoring.  -Follow low glucose/carbohydrate diet.          No orders of the defined types were placed in this encounter.   Follow-up: Return in about 4 months (around 08/23/2020) for HTN, HLD, DM, anemia.    Lorrene Reid, PA-C

## 2020-05-18 ENCOUNTER — Other Ambulatory Visit: Payer: Self-pay | Admitting: Endocrinology

## 2020-05-30 ENCOUNTER — Other Ambulatory Visit: Payer: Self-pay | Admitting: Endocrinology

## 2020-06-18 ENCOUNTER — Other Ambulatory Visit: Payer: Self-pay | Admitting: Endocrinology

## 2020-07-09 ENCOUNTER — Other Ambulatory Visit: Payer: Self-pay

## 2020-07-09 NOTE — Patient Outreach (Signed)
  [facility]) Care Management Chronic Special Needs Program  07/09/2020  Name: Jennifer Bailey DOB: 1935/06/19  MRN: [id number]  Ms. Jennifer Bailey is enrolled in a chronic special needs plan for Diabetes. Reviewed and updated care plan.  Health Risk Assessment completed.  Subjective: client reports she continues to be active. She reports her daughters live with her and they exercise together almost every day to a video. She attends provider visits as schedule. She reports she has her medications and denies any difficulty with medication management. Last [name] 7.7 on 04/15/2020. Client denies any issues or concerns at this time.  Goals Addressed            This Visit's Progress   . Client will verbalize knowledge of self management of Hypertension as evidence by BP reading of 140/90 or less; or as defined by provider   On track    Blood pressure 122/62 and 144/78 on 04/22/2020; 130/60 on 12/18/19 and 135/69 on 12/13/19. Client verbalized taking medications, attends provider visits as scheduled, client reports she keeps a check on her blood pressure reading consistently less than 140/90. Blood pressure medications reviewed.  Continue blood pressure self management actions: Take blood pressure medications as prescribed. Eat Healthy: low salt and heart healthy meals with fruits, vegetables, whole grains, lean protein and limit fat and sugars. Remain active and continue exercise as recommended by your provider. Take blood pressure routinely and call your provider with questions or concerns. Know your target blood pressure range. Ask your provider if you are unsure.     Marland Kitchen HEMOGLOBIN [name] < 7       [name] 7.7 on 04/15/2020. Continue to great work!  Continue Diabetes self management actions: Glucose monitoring per provider recommendations Eat Healthy: eat low carbohydrate and low salt meals, watch portion sizes and avoid sugar sweetened drinks. Visit provider every 3-6 months  as directed Hbg [name] level every 3-6 months. Take medications as prescribed. Attend provider visits as scheduled.    . Maintain timely refills of diabetic medication as prescribed within the year .   On track    Reports she has all her medications. Please call your RN Care Coordinator if you have any difficulty obtaining medications.    . COMPLETED: Obtain annual  Lipid Profile, LDL-C       Done 04/15/2020.    Marland Kitchen COMPLETED: Obtain Annual Eye (retinal)  Exam        Per client done 12/11/19-and seen 2-3 times/year. Next appointment 08/2020.    Marland Kitchen COMPLETED: Obtain Annual Foot Exam       Client reports done 12/18/19    . COMPLETED: Obtain annual screen for micro albuminuria (urine) , nephropathy (kidney problems)       Done 04/15/20     . COMPLETED: Obtain Hemoglobin [name] at least 2 times per year       [name] 7.7 on 04/15/20 and 7.5 on 12/11/19.    Marland Kitchen COMPLETED: Visit Primary Care Provider or Endocrinologist at least 2 times per year        Client reports attends provider visit at least twice a year. Next appointment August 26, 2020      Plan: send updated care plan to client; send updated care plan to primary care provider. Next outreach per tier level within the next 9-12 months.     Thea Silversmith, RN, MSN, [place] [phone]

## 2020-08-12 ENCOUNTER — Other Ambulatory Visit: Payer: HMO

## 2020-08-12 ENCOUNTER — Other Ambulatory Visit (INDEPENDENT_AMBULATORY_CARE_PROVIDER_SITE_OTHER): Payer: HMO

## 2020-08-12 ENCOUNTER — Other Ambulatory Visit: Payer: Self-pay

## 2020-08-12 DIAGNOSIS — H353131 Nonexudative age-related macular degeneration, bilateral, early dry stage: Secondary | ICD-10-CM | POA: Diagnosis not present

## 2020-08-12 DIAGNOSIS — H04123 Dry eye syndrome of bilateral lacrimal glands: Secondary | ICD-10-CM | POA: Diagnosis not present

## 2020-08-12 DIAGNOSIS — E1165 Type 2 diabetes mellitus with hyperglycemia: Secondary | ICD-10-CM

## 2020-08-12 DIAGNOSIS — E063 Autoimmune thyroiditis: Secondary | ICD-10-CM | POA: Diagnosis not present

## 2020-08-12 DIAGNOSIS — Z794 Long term (current) use of insulin: Secondary | ICD-10-CM

## 2020-08-12 DIAGNOSIS — H40011 Open angle with borderline findings, low risk, right eye: Secondary | ICD-10-CM | POA: Diagnosis not present

## 2020-08-12 DIAGNOSIS — H524 Presbyopia: Secondary | ICD-10-CM | POA: Diagnosis not present

## 2020-08-12 DIAGNOSIS — E119 Type 2 diabetes mellitus without complications: Secondary | ICD-10-CM | POA: Diagnosis not present

## 2020-08-12 DIAGNOSIS — H401221 Low-tension glaucoma, left eye, mild stage: Secondary | ICD-10-CM | POA: Diagnosis not present

## 2020-08-12 LAB — BASIC METABOLIC PANEL
BUN: 17 mg/dL (ref 6–23)
CO2: 25 mEq/L (ref 19–32)
Calcium: 9.1 mg/dL (ref 8.4–10.5)
Chloride: 103 mEq/L (ref 96–112)
Creatinine, Ser: 0.84 mg/dL (ref 0.40–1.20)
GFR: 64.38 mL/min (ref >60.00)
Glucose, Bld: 167 mg/dL — ABNORMAL HIGH (ref 70–99)
Potassium: 4.2 mEq/L (ref 3.5–5.1)
Sodium: 135 mEq/L (ref 135–145)

## 2020-08-12 LAB — HEMOGLOBIN A1C: Hgb A1c MFr Bld: 7.6 % — ABNORMAL HIGH (ref 4.6–6.5)

## 2020-08-12 LAB — TSH: TSH: 3.82 u[IU]/mL (ref 0.35–4.50)

## 2020-08-13 ENCOUNTER — Encounter: Payer: Self-pay | Admitting: Endocrinology

## 2020-08-14 ENCOUNTER — Other Ambulatory Visit: Payer: Self-pay | Admitting: Endocrinology

## 2020-08-14 ENCOUNTER — Other Ambulatory Visit: Payer: Self-pay

## 2020-08-14 NOTE — Patient Outreach (Signed)
  [facility]) Care Management Chronic Special Needs Program    08/14/2020  Name: CHRISTENA SUNDERLIN, DOB: January 09, 1935  MRN: [id number]   Ms. Corena Tilson is enrolled in a chronic special needs plan for Diabetes. [place] [facility] Management will continue to provide services for this member through 10/04/2020. The [facility] Care Management Team will assume care 10/05/2020.   Thea Silversmith, RN, MSN, [place] [phone]

## 2020-08-14 NOTE — Telephone Encounter (Signed)
Okay to continue to refill?  Thanks!

## 2020-08-19 ENCOUNTER — Other Ambulatory Visit: Payer: HMO

## 2020-08-26 ENCOUNTER — Ambulatory Visit: Payer: HMO | Admitting: Endocrinology

## 2020-08-26 ENCOUNTER — Encounter: Payer: Self-pay | Admitting: Endocrinology

## 2020-08-26 ENCOUNTER — Ambulatory Visit: Payer: HMO | Admitting: Physician Assistant

## 2020-08-26 ENCOUNTER — Other Ambulatory Visit: Payer: Self-pay

## 2020-08-26 VITALS — BP 124/82 | HR 72 | Ht <= 58 in | Wt 129.8 lb

## 2020-08-26 DIAGNOSIS — E1165 Type 2 diabetes mellitus with hyperglycemia: Secondary | ICD-10-CM | POA: Diagnosis not present

## 2020-08-26 DIAGNOSIS — E559 Vitamin D deficiency, unspecified: Secondary | ICD-10-CM | POA: Diagnosis not present

## 2020-08-26 DIAGNOSIS — I10 Essential (primary) hypertension: Secondary | ICD-10-CM

## 2020-08-26 DIAGNOSIS — Z794 Long term (current) use of insulin: Secondary | ICD-10-CM | POA: Diagnosis not present

## 2020-08-26 DIAGNOSIS — E063 Autoimmune thyroiditis: Secondary | ICD-10-CM

## 2020-08-26 DIAGNOSIS — M85859 Other specified disorders of bone density and structure, unspecified thigh: Secondary | ICD-10-CM

## 2020-08-26 NOTE — Progress Notes (Signed)
Patient ID: Jennifer Bailey, female   DOB: 08-06-1935, 85 y.o.   MRN: [id number]   Chief complaint: follow up    History of Present Illness     Type 2 DIABETES MELITUS, date of diagnosis: 2003  She has been on insulin for several years but continues to have labile blood sugars despite using basal bolus regimen  She is very compliant with her insulin at mealtimes  Is taking small amounts of insulin and is not on any oral hypoglycemics She was changed from Levemir to [place] insulin in 1/17 because of relatively inconsistent control   RECENT history:   Insulin regimen: Tresiba 10 units in a.m.;   mealtime NovoLog coverage: At breakfast:  3 units, 3  lunch and 4 at supper                                  Current blood sugar patterns from freestyle libre, diabetes management and problems identified:  Daily management, problems identified and abnormal patterns are discussed in detail in the CGM analysis   Her [name] is 7.6   Her mealtime insulin doses were not changed on the last visit but her Tyler Aas was reduced by 1 unit  As before she is requiring relatively small amounts of insulin especially at mealtimes  Mealtime insulin is being adjusted based on her premeal readings and sometimes will take more for larger meals  Overall diet has been the same and weight is stable  She tries to have a snack midmorning after her exercise but still may get low normal readings at lunchtime, her snack is only cookies  Blood sugar patterns show the following on her CGM interpretation   She has on an average about the same blood sugar readings throughout the day and night with HIGHEST average blood sugar before and after breakfast and lowest after lunch  Recently not having much dawn phenomenon in the mornings  Otherwise overnight blood sugars appear to be relatively flat, frequently higher than target but occasionally near normal or low normal without hypoglycemia  Postprandial  readings are usually on an average relatively flat after breakfast, lower after lunch and sometimes lower after dinner also  HYPOGLYCEMIA has occurred periodically before and after lunch and rarely low normal after dinner, lowest reading has been in the 40s but not clear how accurate her meter is    Monitors blood glucose: Several times a day    Glucometer:  Freestyle libre        CGM use % of time  98  2-week average/SD  140+/-30  Time in range        80%  % Time Above 180  17  % Time above 250   % Time Below 70  3     PRE-MEAL Fasting Lunch Dinner Bedtime Overall  Glucose range:       Averages: 145 146  143  140   POST-MEAL PC Breakfast PC Lunch PC Dinner  Glucose range:     Averages:  117  125   Prior data:   CGM use % of time  97  Average and SD 146, GV 31  Time in range      76%  % Time Above 180  21  % Time above 250   % Time Below target 3    PRE-MEAL Fasting Lunch Dinner Bedtime Overall  Glucose range:       Mean/median:  131  163  154  126  146   POST-MEAL PC Breakfast PC Lunch PC Dinner  Glucose range:     Mean/median:  167  155  157      Symptoms with hypoglycemia: feels weak and shaky Low sugars are treated with Soft drink or glucose tablets      Meals: 3 meals per day.  Usually has small portions. Eggs/bread or one pancake with protein at 6:30 AM-7 am; has mid morning snack.  Has hs snack if blood sugar not high Evening meal usually balanced at 5 pm with 1-2 carbohydrates  Physical activity: exercise: Some walking and yardwork         Dietician visit: Most recent: 2004            Wt Readings from Last 3 Encounters:  08/26/20 129 lb 12.8 oz (58.9 kg)  04/22/20 138 lb 3.2 oz (62.7 kg)  04/22/20 130 lb (59 kg)   Complications: are: Mild neuropathy controlled symptomatically  Lab Results  Component Value Date   HGBA1C 7.6 (H) 08/12/2020   HGBA1C 7.7 (H) 04/15/2020   HGBA1C 7.5 (H) 12/11/2019   Lab Results  Component Value Date   MICROALBUR  <0.7 04/15/2020   LDLCALC 58 04/15/2020   CREATININE 0.84 08/12/2020    HYPERTENSION:  this has been mild and treated with 50 mg losartan.  She is checking her blood pressure at home and it is usually normal   BP Readings from Last 3 Encounters:  08/26/20 124/82  04/22/20 (!) 144/78  04/22/20 122/62    OSTEOPENIA:  She has had osteopenia with T score -2.3 at the hip in 3/09 and was started on Fosamax at that time. This was stopped in 02/2014.   No history of fracture Last bone density in 2016: DualFemur Neck Right 10/03/2015 T score -1.9   History of vitamin D Deficiency, she is taking 50,000 units monthly with adequate levels  Lab Results  Component Value Date   VD25OH 60.16 02/06/2019   VD25OH 64.10 07/11/2018    OTHER active problems: See review of systems   LABS:    No visits with results within 1 Week(s) from this visit.  Latest known visit with results is:  Lab on 08/12/2020  Component Date Value Ref Range Status  . TSH 08/12/2020 3.82  0.35 - 4.50 uIU/mL Final  . Sodium 08/12/2020 135  135 - 145 mEq/L Final  . Potassium 08/12/2020 4.2  3.5 - 5.1 mEq/L Final  . Chloride 08/12/2020 103  96 - 112 mEq/L Final  . CO2 08/12/2020 25  19 - 32 mEq/L Final  . Glucose, Bld 08/12/2020 167* 70 - 99 mg/dL Final  . BUN 08/12/2020 17  6 - 23 mg/dL Final  . Creatinine, Ser 08/12/2020 0.84  0.40 - 1.20 mg/dL Final  . GFR 08/12/2020 64.38  >60.00 mL/min Final   Calculated using the CKD-EPI Creatinine Equation (2021)  . Calcium 08/12/2020 9.1  8.4 - 10.5 mg/dL Final  . Hgb [name] MFr Bld 08/12/2020 7.6* 4.6 - 6.5 % Final   Glycemic Control Guidelines for People with Diabetes:Non Diabetic:  <6%Goal of Therapy: <7%Additional Action Suggested:  >8%     Allergies as of 08/26/2020      Reactions   Macrodantin [nitrofurantoin Macrocrystal] Swelling   Prednisone    Make real "shaky-like, nervious"   Penicillins Rash   Has patient had a PCN reaction causing immediate  rash, facial/tongue/throat swelling, SOB or lightheadedness with hypotension:Yes Has patient had a PCN  reaction causing severe rash involving mucus membranes or skin necrosis: No Has patient had a PCN reaction that required hospitalization: No Has patient had a PCN reaction occurring within the last 10 years:No If all of the above answers are "NO", then may proceed with Cephalosporin use.      Medication List       Accurate as of August 26, 2020 11:23 AM. If you have any questions, ask your nurse or doctor.        Alcohol Pads 70 % Pads USE 4 PER DAY   Alphagan P 0.1 % Soln Generic drug: brimonidine Place 1 drop into the left eye 2 (two) times daily.   aspirin 81 MG tablet Take 81 mg by mouth daily.   BD Pen Needle Nano U/F 32G X 4 MM Misc Generic drug: Insulin Pen Needle USE AS DIRECTED FIVE TIMES A DAY.   cycloSPORINE 0.05 % ophthalmic emulsion Commonly known as: RESTASIS 1 drop 2 (two) times daily.   dorzolamidel-timolol 22.3-6.8 MG/ML Soln ophthalmic solution Commonly known as: COSOPT Place 1 drop into the left eye 2 (two) times daily.   Omega-3 1000 MG Caps Take 1 capsule by mouth in the morning, at noon, and at bedtime.   fish oil-omega-3 fatty acids 1000 MG capsule Take 2 g by mouth daily. Two a day   FreeStyle Libre 14 Day Reader Kerrin Mo 1 each by Does not apply route daily. USE DAILY TO CHECK BLOOD SUGAR WITH FREESTYLE LIBRE SENSOR.   FreeStyle Libre 14 Day Sensor Misc APPLY TO ARM EVERY 14 DAYS TO MONITOR BLOOD SUGAR.   gabapentin 100 MG capsule Commonly known as: NEURONTIN TAKE 3 TO 4 CAPSULES BY MOUTH AT BEDTIME.   levothyroxine 75 MCG tablet Commonly known as: SYNTHROID TAKE 1 TABLET BY MOUTH DAILY BEFORE BREAKFAST, EXCEPT TAKE 1 AND 1/2 TABLETS ON SUNDAY.   losartan 50 MG tablet Commonly known as: COZAAR TAKE 1 TABLET BY MOUTH DAILY.   lovastatin 40 MG tablet Commonly known as: MEVACOR TAKE 1 TABLET BY MOUTH ONCE DAILY WITH A MEAL.    NovoLOG FlexPen 100 UNIT/ML FlexPen Generic drug: insulin aspart INJECT 3 TO 5 UNITS SUBCUTANEOUSLY BEFORE MEALS AS DIRECTED.   pantoprazole 40 MG tablet Commonly known as: PROTONIX Take 40 mg by mouth 2 (two) times daily.   PreserVision AREDS 2 Caps Take by mouth.   sucralfate 1 g tablet Commonly known as: CARAFATE Take 1 g by mouth 2 (two) times daily as needed.   Tyler Aas FlexTouch 100 UNIT/ML FlexTouch Pen Generic drug: insulin degludec Inject 0.11 mLs (11 Units total) into the skin daily. Inject 11 units under the skin once daily. What changed: how much to take   vitamin C 100 MG tablet Take 500 mg by mouth daily.   Vitamin D (Ergocalciferol) 1.25 MG (50000 UNIT) Caps capsule Commonly known as: DRISDOL TAKE 1 CAPSULE BY MOUTH ONCE A MONTH.       Allergies:  Allergies  Allergen Reactions  . Macrodantin [Nitrofurantoin Macrocrystal] Swelling  . Prednisone     Make real "shaky-like, nervious"  . Penicillins Rash    Has patient had a PCN reaction causing immediate rash, facial/tongue/throat swelling, SOB or lightheadedness with hypotension:Yes Has patient had a PCN reaction causing severe rash involving mucus membranes or skin necrosis: No Has patient had a PCN reaction that required hospitalization: No Has patient had a PCN reaction occurring within the last 10 years:No If all of the above answers are "NO", then may proceed with Cephalosporin use.  Past Medical History:  Diagnosis Date  . Diabetes mellitus without complication ([place])   . Thyroid disease     Past Surgical History:  Procedure Laterality Date  . ABDOMINAL HYSTERECTOMY    . CHOLECYSTECTOMY    . COLON SURGERY      Family History  Problem Relation Age of Onset  . Cancer Mother   . Diabetes Brother     Social History:  reports that she has never smoked. She has never used smokeless tobacco. She reports that she does not drink alcohol and does not use drugs.   Review of Systems    NEUROPATHY: She has good control of her symptoms with taking gabapentin at night   HYPOTHYROIDISM: She has had primary hypothyroidism, long-standing . Her TSH levels have been stable with 75 mcg levothyroxine 7.5 tablets per week, has been on this dose since 11/15  Lab Results  Component Value Date   TSH 3.82 08/12/2020   TSH 3.64 04/15/2020   TSH 1.08 12/11/2019   FREET4 1.27 04/15/2020   FREET4 1.24 08/07/2019   FREET4 1.14 10/10/2018      HYPERLIPIDEMIA: The lipid abnormality consists of elevated LDL,  taking lovastatin with consistently good control, last level below 70.  Lab Results  Component Value Date   CHOL 134 04/15/2020   HDL 50.00 04/15/2020   LDLCALC 58 04/15/2020   TRIG 129.0 04/15/2020   CHOLHDL 3 04/15/2020     She has had her Covid booster and flu shots   Examination:   BP 124/82   Pulse 72   Ht 4\' 10"  (1.473 m)   Wt 129 lb 12.8 oz (58.9 kg)   SpO2 97%   BMI 27.13 kg/m   Body mass index is 27.13 kg/m.      ASSESSMENT/ PLAN:     Diabetes type 2 on insulin   See history of present illness for detailed discussion of current management, blood sugar patterns and problems identified  Her [name] is still fairly good at 7.6  She is on basal bolus insulin  Her CGM interpretation was reviewed and discussed abnormal patterns Although she is having relatively high readings overnight these are consistent Since she may have low normal readings before dinnertime would not need to increase her basal insulin as yet  To reduce hypoglycemia after lunch she can reduce her basic insulin dose to 2 units NovoLog She needs to add a protein to her midmorning snack also Also may consider reducing suppertime NovoLog coverage especially for lower carbohydrate meals She will be given the reader for the freestyle libre version 2 Discussed that this may be more accurate but also occasionally alert her in advance with alarms for high and low readings especially to  avoid hypoglycemia in time  Hypertension:  Her blood pressure is consistentlycontrolled She will continue 50 mg of losartan and let us know if blood pressure is unusually high or low at home   Hypothyroidism :her TSH has been normal around 3-4 and she will continue the 75 mcg prescription as before   Needs bone density and order has been sent, she will call to schedule  Follow-up in 4 months     Patient Instructions  Take 2 Novolog at lunch  Add protein to 10 am snack  Call for bone density [phone]     Elayne Snare 08/26/2020, 11:23 AM

## 2020-08-26 NOTE — Patient Instructions (Addendum)
Take 2 Novolog at lunch  Add protein to 10 am snack  Call for bone density [phone]

## 2020-09-21 ENCOUNTER — Other Ambulatory Visit: Payer: Self-pay | Admitting: Endocrinology

## 2020-09-24 ENCOUNTER — Telehealth: Payer: Self-pay | Admitting: Endocrinology

## 2020-09-24 ENCOUNTER — Other Ambulatory Visit: Payer: Self-pay | Admitting: Endocrinology

## 2020-09-24 MED ORDER — FREESTYLE LIBRE 14 DAY SENSOR MISC: 2 refills | Status: DC

## 2020-09-24 MED ORDER — FREESTYLE LIBRE 14 DAY READER DEVI
0 refills | Status: DC
Start: 2020-09-24 — End: 2024-05-15

## 2020-09-24 NOTE — Telephone Encounter (Signed)
Sent as requested.

## 2020-09-24 NOTE — Telephone Encounter (Signed)
Patient called to advise that she did not get the Free Style [place] 2 sensors and needs those called in to [place] on [address]

## 2020-09-25 ENCOUNTER — Other Ambulatory Visit: Payer: Self-pay | Admitting: Endocrinology

## 2020-10-09 ENCOUNTER — Other Ambulatory Visit: Payer: Self-pay

## 2020-10-10 ENCOUNTER — Other Ambulatory Visit: Payer: Self-pay | Admitting: Endocrinology

## 2020-11-18 DIAGNOSIS — Z79899 Other long term (current) drug therapy: Secondary | ICD-10-CM | POA: Diagnosis not present

## 2020-11-18 DIAGNOSIS — H04123 Dry eye syndrome of bilateral lacrimal glands: Secondary | ICD-10-CM | POA: Diagnosis not present

## 2020-11-18 DIAGNOSIS — H40011 Open angle with borderline findings, low risk, right eye: Secondary | ICD-10-CM | POA: Diagnosis not present

## 2020-11-18 DIAGNOSIS — E119 Type 2 diabetes mellitus without complications: Secondary | ICD-10-CM | POA: Diagnosis not present

## 2020-11-18 DIAGNOSIS — Z794 Long term (current) use of insulin: Secondary | ICD-10-CM | POA: Diagnosis not present

## 2020-11-18 DIAGNOSIS — H401221 Low-tension glaucoma, left eye, mild stage: Secondary | ICD-10-CM | POA: Diagnosis not present

## 2020-11-18 DIAGNOSIS — H353131 Nonexudative age-related macular degeneration, bilateral, early dry stage: Secondary | ICD-10-CM | POA: Diagnosis not present

## 2020-12-02 ENCOUNTER — Inpatient Hospital Stay (HOSPITAL_COMMUNITY): Payer: HMO

## 2020-12-02 ENCOUNTER — Inpatient Hospital Stay (HOSPITAL_COMMUNITY)
Admission: EM | Admit: 2020-12-02 | Discharge: 2020-12-10 | DRG: 493 | Disposition: A | Discharge status: Skilled Nursing Facility | Payer: HMO | Attending: Internal Medicine | Admitting: Internal Medicine

## 2020-12-02 ENCOUNTER — Encounter (HOSPITAL_COMMUNITY): Payer: Self-pay

## 2020-12-02 ENCOUNTER — Other Ambulatory Visit: Payer: Self-pay

## 2020-12-02 ENCOUNTER — Emergency Department (HOSPITAL_COMMUNITY): Payer: HMO

## 2020-12-02 DIAGNOSIS — S82401A Unspecified fracture of shaft of right fibula, initial encounter for closed fracture: Secondary | ICD-10-CM | POA: Diagnosis not present

## 2020-12-02 DIAGNOSIS — E1165 Type 2 diabetes mellitus with hyperglycemia: Secondary | ICD-10-CM | POA: Diagnosis present

## 2020-12-02 DIAGNOSIS — E1159 Type 2 diabetes mellitus with other circulatory complications: Secondary | ICD-10-CM | POA: Diagnosis present

## 2020-12-02 DIAGNOSIS — Z7982 Long term (current) use of aspirin: Secondary | ICD-10-CM

## 2020-12-02 DIAGNOSIS — Z90710 Acquired absence of both cervix and uterus: Secondary | ICD-10-CM | POA: Diagnosis not present

## 2020-12-02 DIAGNOSIS — S82255D Nondisplaced comminuted fracture of shaft of left tibia, subsequent encounter for closed fracture with routine healing: Secondary | ICD-10-CM | POA: Diagnosis not present

## 2020-12-02 DIAGNOSIS — S82831A Other fracture of upper and lower end of right fibula, initial encounter for closed fracture: Secondary | ICD-10-CM | POA: Diagnosis not present

## 2020-12-02 DIAGNOSIS — I959 Hypotension, unspecified: Secondary | ICD-10-CM | POA: Diagnosis not present

## 2020-12-02 DIAGNOSIS — Z79890 Hormone replacement therapy (postmenopausal): Secondary | ICD-10-CM

## 2020-12-02 DIAGNOSIS — Z01818 Encounter for other preprocedural examination: Secondary | ICD-10-CM | POA: Diagnosis not present

## 2020-12-02 DIAGNOSIS — S82209A Unspecified fracture of shaft of unspecified tibia, initial encounter for closed fracture: Secondary | ICD-10-CM | POA: Diagnosis present

## 2020-12-02 DIAGNOSIS — Z794 Long term (current) use of insulin: Secondary | ICD-10-CM | POA: Diagnosis not present

## 2020-12-02 DIAGNOSIS — S82831D Other fracture of upper and lower end of right fibula, subsequent encounter for closed fracture with routine healing: Secondary | ICD-10-CM | POA: Diagnosis not present

## 2020-12-02 DIAGNOSIS — S82409A Unspecified fracture of shaft of unspecified fibula, initial encounter for closed fracture: Secondary | ICD-10-CM | POA: Diagnosis present

## 2020-12-02 DIAGNOSIS — K5900 Constipation, unspecified: Secondary | ICD-10-CM | POA: Diagnosis not present

## 2020-12-02 DIAGNOSIS — S82101A Unspecified fracture of upper end of right tibia, initial encounter for closed fracture: Secondary | ICD-10-CM

## 2020-12-02 DIAGNOSIS — Z833 Family history of diabetes mellitus: Secondary | ICD-10-CM | POA: Diagnosis not present

## 2020-12-02 DIAGNOSIS — E785 Hyperlipidemia, unspecified: Secondary | ICD-10-CM | POA: Diagnosis not present

## 2020-12-02 DIAGNOSIS — M2550 Pain in unspecified joint: Secondary | ICD-10-CM | POA: Diagnosis not present

## 2020-12-02 DIAGNOSIS — Y92009 Unspecified place in unspecified non-institutional (private) residence as the place of occurrence of the external cause: Secondary | ICD-10-CM | POA: Diagnosis not present

## 2020-12-02 DIAGNOSIS — E1169 Type 2 diabetes mellitus with other specified complication: Secondary | ICD-10-CM | POA: Diagnosis present

## 2020-12-02 DIAGNOSIS — S82141A Displaced bicondylar fracture of right tibia, initial encounter for closed fracture: Principal | ICD-10-CM | POA: Diagnosis present

## 2020-12-02 DIAGNOSIS — T148XXA Other injury of unspecified body region, initial encounter: Secondary | ICD-10-CM

## 2020-12-02 DIAGNOSIS — Y92018 Other place in single-family (private) house as the place of occurrence of the external cause: Secondary | ICD-10-CM | POA: Diagnosis not present

## 2020-12-02 DIAGNOSIS — D62 Acute posthemorrhagic anemia: Secondary | ICD-10-CM

## 2020-12-02 DIAGNOSIS — IMO0002 Reserved for concepts with insufficient information to code with codable children: Secondary | ICD-10-CM | POA: Diagnosis present

## 2020-12-02 DIAGNOSIS — I10 Essential (primary) hypertension: Secondary | ICD-10-CM | POA: Diagnosis not present

## 2020-12-02 DIAGNOSIS — E1142 Type 2 diabetes mellitus with diabetic polyneuropathy: Secondary | ICD-10-CM | POA: Diagnosis present

## 2020-12-02 DIAGNOSIS — I152 Hypertension secondary to endocrine disorders: Secondary | ICD-10-CM | POA: Diagnosis present

## 2020-12-02 DIAGNOSIS — Z7189 Other specified counseling: Secondary | ICD-10-CM

## 2020-12-02 DIAGNOSIS — S82191A Other fracture of upper end of right tibia, initial encounter for closed fracture: Secondary | ICD-10-CM | POA: Diagnosis not present

## 2020-12-02 DIAGNOSIS — M79604 Pain in right leg: Secondary | ICD-10-CM | POA: Diagnosis not present

## 2020-12-02 DIAGNOSIS — H04123 Dry eye syndrome of bilateral lacrimal glands: Secondary | ICD-10-CM | POA: Diagnosis not present

## 2020-12-02 DIAGNOSIS — D72829 Elevated white blood cell count, unspecified: Secondary | ICD-10-CM | POA: Diagnosis present

## 2020-12-02 DIAGNOSIS — W009XXA Unspecified fall due to ice and snow, initial encounter: Secondary | ICD-10-CM

## 2020-12-02 DIAGNOSIS — M810 Age-related osteoporosis without current pathological fracture: Secondary | ICD-10-CM | POA: Diagnosis not present

## 2020-12-02 DIAGNOSIS — E11649 Type 2 diabetes mellitus with hypoglycemia without coma: Secondary | ICD-10-CM | POA: Diagnosis present

## 2020-12-02 DIAGNOSIS — W001XXA Fall from stairs and steps due to ice and snow, initial encounter: Secondary | ICD-10-CM | POA: Diagnosis present

## 2020-12-02 DIAGNOSIS — L89616 Pressure-induced deep tissue damage of right heel: Secondary | ICD-10-CM | POA: Diagnosis not present

## 2020-12-02 DIAGNOSIS — Z79899 Other long term (current) drug therapy: Secondary | ICD-10-CM | POA: Diagnosis not present

## 2020-12-02 DIAGNOSIS — E039 Hypothyroidism, unspecified: Secondary | ICD-10-CM | POA: Diagnosis present

## 2020-12-02 DIAGNOSIS — M25561 Pain in right knee: Secondary | ICD-10-CM | POA: Diagnosis not present

## 2020-12-02 DIAGNOSIS — Z7401 Bed confinement status: Secondary | ICD-10-CM | POA: Diagnosis not present

## 2020-12-02 DIAGNOSIS — W19XXXA Unspecified fall, initial encounter: Secondary | ICD-10-CM | POA: Diagnosis not present

## 2020-12-02 DIAGNOSIS — S82251A Displaced comminuted fracture of shaft of right tibia, initial encounter for closed fracture: Secondary | ICD-10-CM | POA: Diagnosis not present

## 2020-12-02 DIAGNOSIS — S82201A Unspecified fracture of shaft of right tibia, initial encounter for closed fracture: Secondary | ICD-10-CM

## 2020-12-02 DIAGNOSIS — Z419 Encounter for procedure for purposes other than remedying health state, unspecified: Secondary | ICD-10-CM

## 2020-12-02 DIAGNOSIS — R609 Edema, unspecified: Secondary | ICD-10-CM | POA: Diagnosis not present

## 2020-12-02 DIAGNOSIS — G629 Polyneuropathy, unspecified: Secondary | ICD-10-CM | POA: Diagnosis not present

## 2020-12-02 DIAGNOSIS — Z20822 Contact with and (suspected) exposure to covid-19: Secondary | ICD-10-CM | POA: Diagnosis not present

## 2020-12-02 DIAGNOSIS — Z4789 Encounter for other orthopedic aftercare: Secondary | ICD-10-CM | POA: Diagnosis not present

## 2020-12-02 DIAGNOSIS — W19XXXD Unspecified fall, subsequent encounter: Secondary | ICD-10-CM | POA: Diagnosis not present

## 2020-12-02 DIAGNOSIS — S82101D Unspecified fracture of upper end of right tibia, subsequent encounter for closed fracture with routine healing: Secondary | ICD-10-CM | POA: Diagnosis not present

## 2020-12-02 DIAGNOSIS — M7989 Other specified soft tissue disorders: Secondary | ICD-10-CM | POA: Diagnosis not present

## 2020-12-02 DIAGNOSIS — H409 Unspecified glaucoma: Secondary | ICD-10-CM

## 2020-12-02 DIAGNOSIS — Z7901 Long term (current) use of anticoagulants: Secondary | ICD-10-CM | POA: Diagnosis not present

## 2020-12-02 DIAGNOSIS — L89609 Pressure ulcer of unspecified heel, unspecified stage: Secondary | ICD-10-CM

## 2020-12-02 DIAGNOSIS — M6281 Muscle weakness (generalized): Secondary | ICD-10-CM | POA: Diagnosis not present

## 2020-12-02 HISTORY — DX: Unspecified fracture of shaft of unspecified tibia, initial encounter for closed fracture: S82.209A

## 2020-12-02 HISTORY — DX: Unspecified fall, initial encounter: Y92.009

## 2020-12-02 HISTORY — DX: Unspecified fracture of shaft of unspecified fibula, initial encounter for closed fracture: S82.409A

## 2020-12-02 HISTORY — DX: Unspecified place in unspecified non-institutional (private) residence as the place of occurrence of the external cause: W19.XXXA

## 2020-12-02 LAB — CBC WITH DIFFERENTIAL/PLATELET
Abs Immature Granulocytes: 0.11 10*3/uL — ABNORMAL HIGH (ref 0.00–0.07)
Basophils Absolute: 0.1 10*3/uL (ref 0.0–0.1)
Basophils Relative: 0 %
Eosinophils Absolute: 0.2 10*3/uL (ref 0.0–0.5)
Eosinophils Relative: 2 %
HCT: 32.5 % — ABNORMAL LOW (ref 36.0–46.0)
Hemoglobin: 10.9 g/dL — ABNORMAL LOW (ref 12.0–15.0)
Immature Granulocytes: 1 %
Lymphocytes Relative: 10 %
Lymphs Abs: 1.4 10*3/uL (ref 0.7–4.0)
MCH: 30.6 pg (ref 26.0–34.0)
MCHC: 33.5 g/dL (ref 30.0–36.0)
MCV: 91.3 fL (ref 80.0–100.0)
Monocytes Absolute: 0.9 10*3/uL (ref 0.1–1.0)
Monocytes Relative: 6 %
Neutro Abs: 11.9 10*3/uL — ABNORMAL HIGH (ref 1.7–7.7)
Neutrophils Relative %: 81 %
Platelets: 269 10*3/uL (ref 150–400)
RBC: 3.56 MIL/uL — ABNORMAL LOW (ref 3.87–5.11)
RDW: 13.8 % (ref 11.5–15.5)
WBC: 14.6 10*3/uL — ABNORMAL HIGH (ref 4.0–10.5)
nRBC: 0 % (ref 0.0–0.2)

## 2020-12-02 LAB — COMPREHENSIVE METABOLIC PANEL
ALT: 17 U/L (ref 0–44)
AST: 24 U/L (ref 15–41)
Albumin: 3.6 g/dL (ref 3.5–5.0)
Alkaline Phosphatase: 63 U/L (ref 38–126)
Anion gap: 8 (ref 5–15)
BUN: 18 mg/dL (ref 8–23)
CO2: 24 mmol/L (ref 22–32)
Calcium: 8.9 mg/dL (ref 8.9–10.3)
Chloride: 106 mmol/L (ref 98–111)
Creatinine, Ser: 0.68 mg/dL (ref 0.44–1.00)
GFR, Estimated: >60 mL/min (ref >60)
Glucose, Bld: 148 mg/dL — ABNORMAL HIGH (ref 70–99)
Potassium: 3.8 mmol/L (ref 3.5–5.1)
Sodium: 138 mmol/L (ref 135–145)
Total Bilirubin: 0.6 mg/dL (ref 0.3–1.2)
Total Protein: 6.4 g/dL — ABNORMAL LOW (ref 6.5–8.1)

## 2020-12-02 LAB — HEMOGLOBIN A1C
Hgb A1c MFr Bld: 7.3 % — ABNORMAL HIGH (ref 4.8–5.6)
Mean Plasma Glucose: 162.81 mg/dL

## 2020-12-02 LAB — RESP PANEL BY RT-PCR (FLU A&B, COVID) ARPGX2
Influenza A by PCR: NEGATIVE
Influenza B by PCR: NEGATIVE
SARS Coronavirus 2 by RT PCR: NEGATIVE

## 2020-12-02 LAB — PROTIME-INR
INR: 1 (ref 0.8–1.2)
Prothrombin Time: 12.8 seconds (ref 11.4–15.2)

## 2020-12-02 LAB — SURGICAL PCR SCREEN
MRSA, PCR: NEGATIVE
Staphylococcus aureus: NEGATIVE

## 2020-12-02 LAB — GLUCOSE, CAPILLARY
Glucose-Capillary: 102 mg/dL — ABNORMAL HIGH (ref 70–99)
Glucose-Capillary: 100 mg/dL — ABNORMAL HIGH (ref 70–99)
Glucose-Capillary: 180 mg/dL — ABNORMAL HIGH (ref 70–99)

## 2020-12-02 MED ORDER — POVIDONE-IODINE 10 % EX SWAB: 2.0000 "application " | Freq: Once | CUTANEOUS | Status: DC

## 2020-12-02 MED ORDER — FENTANYL CITRATE (PF) 100 MCG/2ML IJ SOLN
50.0000 ug | Freq: Once | INTRAMUSCULAR | Status: AC
  Administered 2020-12-02: 50 ug via INTRAVENOUS
  Filled 2020-12-02: qty 2

## 2020-12-02 MED ORDER — POLYETHYLENE GLYCOL 3350 17 G PO PACK: 17.0000 g | PACK | Freq: Every day | ORAL | Status: DC | PRN

## 2020-12-02 MED ORDER — INSULIN ASPART 100 UNIT/ML ~~LOC~~ SOLN
0.0000 [IU] | Freq: Three times a day (TID) | SUBCUTANEOUS | Status: DC
  Administered 2020-12-03: 9 [IU] via SUBCUTANEOUS
  Administered 2020-12-03 – 2020-12-04 (×3): 7 [IU] via SUBCUTANEOUS
  Administered 2020-12-05: 5 [IU] via SUBCUTANEOUS
  Administered 2020-12-05: 3 [IU] via SUBCUTANEOUS
  Administered 2020-12-05: 7 [IU] via SUBCUTANEOUS
  Administered 2020-12-06: 5 [IU] via SUBCUTANEOUS
  Administered 2020-12-06 (×2): 3 [IU] via SUBCUTANEOUS
  Administered 2020-12-07: 2 [IU] via SUBCUTANEOUS
  Administered 2020-12-07: 3 [IU] via SUBCUTANEOUS
  Administered 2020-12-07: 5 [IU] via SUBCUTANEOUS
  Administered 2020-12-08 (×2): 2 [IU] via SUBCUTANEOUS
  Administered 2020-12-08: 5 [IU] via SUBCUTANEOUS
  Administered 2020-12-09: 3 [IU] via SUBCUTANEOUS
  Administered 2020-12-09: 5 [IU] via SUBCUTANEOUS
  Administered 2020-12-09: 3 [IU] via SUBCUTANEOUS
  Administered 2020-12-10: 7 [IU] via SUBCUTANEOUS
  Administered 2020-12-10: 3 [IU] via SUBCUTANEOUS

## 2020-12-02 MED ORDER — ACETAMINOPHEN 325 MG PO TABS
650.0000 mg | ORAL_TABLET | Freq: Four times a day (QID) | ORAL | Status: DC | PRN
  Administered 2020-12-04 – 2020-12-08 (×5): 650 mg via ORAL
  Filled 2020-12-02 (×6): qty 2

## 2020-12-02 MED ORDER — HEPARIN SODIUM (PORCINE) 5000 UNIT/ML IJ SOLN
5000.0000 [IU] | Freq: Three times a day (TID) | INTRAMUSCULAR | Status: DC
  Administered 2020-12-02: 5000 [IU] via SUBCUTANEOUS
  Filled 2020-12-02: qty 1

## 2020-12-02 MED ORDER — ONDANSETRON HCL 4 MG/2ML IJ SOLN
4.0000 mg | Freq: Three times a day (TID) | INTRAMUSCULAR | Status: DC | PRN
  Administered 2020-12-04: 4 mg via INTRAVENOUS
  Filled 2020-12-02 (×2): qty 2

## 2020-12-02 MED ORDER — VANCOMYCIN HCL 1000 MG/200ML IV SOLN
1000.0000 mg | INTRAVENOUS | Status: DC
  Filled 2020-12-02: qty 200

## 2020-12-02 MED ORDER — CHLORHEXIDINE GLUCONATE 4 % EX LIQD
60.0000 mL | Freq: Once | CUTANEOUS | Status: AC
  Administered 2020-12-03: 4 via TOPICAL
  Filled 2020-12-02: qty 15

## 2020-12-02 MED ORDER — SODIUM CHLORIDE 0.9% FLUSH
3.0000 mL | Freq: Two times a day (BID) | INTRAVENOUS | Status: DC
  Administered 2020-12-02 – 2020-12-10 (×11): 3 mL via INTRAVENOUS

## 2020-12-02 MED ORDER — METOPROLOL TARTRATE 5 MG/5ML IV SOLN: 5.0000 mg | Freq: Four times a day (QID) | INTRAVENOUS | Status: DC | PRN

## 2020-12-02 MED ORDER — INSULIN ASPART 100 UNIT/ML ~~LOC~~ SOLN: 0.0000 [IU] | Freq: Three times a day (TID) | SUBCUTANEOUS | Status: DC

## 2020-12-02 MED ORDER — INSULIN GLARGINE 100 UNIT/ML ~~LOC~~ SOLN
5.0000 [IU] | Freq: Every day | SUBCUTANEOUS | Status: DC
  Administered 2020-12-02 – 2020-12-05 (×3): 5 [IU] via SUBCUTANEOUS
  Filled 2020-12-02 (×4): qty 0.05

## 2020-12-02 MED ORDER — LACTATED RINGERS IV SOLN: INTRAVENOUS | Status: DC

## 2020-12-02 MED ORDER — HYDROCODONE-ACETAMINOPHEN 7.5-325 MG PO TABS
1.0000 | ORAL_TABLET | Freq: Four times a day (QID) | ORAL | Status: DC | PRN
  Administered 2020-12-02 – 2020-12-07 (×9): 1 via ORAL
  Filled 2020-12-02 (×9): qty 1

## 2020-12-02 MED ORDER — HYDROMORPHONE HCL 1 MG/ML IJ SOLN
0.5000 mg | INTRAMUSCULAR | Status: DC | PRN
  Administered 2020-12-02: 0.5 mg via INTRAVENOUS
  Filled 2020-12-02: qty 0.5

## 2020-12-02 NOTE — ED Notes (Signed)
Called ortho tech to apply knee immobilizer

## 2020-12-02 NOTE — ED Notes (Signed)
Attempted to call report to [phone] but nurse is at lunch will call back

## 2020-12-02 NOTE — H&P (Signed)
History and Physical        Hospital Admission Note Date: 12/02/2020  Patient name: Jennifer Bailey Medical record number: [id number] Date of birth: Feb 07, 1935 Age: 86 y.o. Gender: female  PCP: System, Provider Not In  Patient coming from: home Lives with: daughter At baseline, ambulates: independently  Chief Complaint    Chief Complaint  Patient presents with  . Fall      HPI:   This is an 86 year old female with past medical history of diabetes with diabetic neuropathy, hypertension, hyperlipidemia, osteoporosis, hypothyroid who presented to the ED s/p fall this AM.  Patient slipped on a patch of ice and fell down 3 stairs on her deck this a.m. hitting her right knee and lower leg on the ground.  She did not lose consciousness or hit her head or has any other pain or injury.  Admits to sensation in her feet currently.  Otherwise no chest pain, fever, chills or any other complaints.  Lives with her daughter, ambulates independently and does not have a history of frequent falls.   ED Course: Afebrile, hypertensive, on room air. Notable Labs: Glucose 148, BUN 18, creatinine 0.68, WBC 14.6, Hb 10.9, COVID-19 pending. Notable Imaging: Right knee/tibia/fibula/ankle XR-acute transversely oriented fracture through the proximal tibial metaphysis with slight anterior apex angulation; comminuted fracture of the fibular neck with minimal medial displacement and right ankle without fracture or malignment. Patient received fentanyl.  ED provider discussed with Dr. Alvan Dame, orthopedic surgery, who recommended transfer to [facility] so Dr. Marcelino Scot and [facility] can evaluate her and may be a candidate for surgery today, recommended CT scan which is pending as well.   Vitals:   12/02/20 1142 12/02/20 1300  BP: (!) 136/49 (!) 130/40  Pulse: 70 68  Resp: 18 16  Temp:  98.2 F (36.8 C)  SpO2: 98% 97%      Review of Systems:  Review of Systems  All other systems reviewed and are negative.   Medical/Social/Family History   Past Medical History: Past Medical History:  Diagnosis Date  . Diabetes mellitus without complication ([place])   . Thyroid disease     Past Surgical History:  Procedure Laterality Date  . ABDOMINAL HYSTERECTOMY    . CHOLECYSTECTOMY    . COLON SURGERY      Medications: Prior to Admission medications   Medication Sig Start Date End Date Taking? Authorizing Provider  Alcohol Swabs (ALCOHOL PADS) 70 % PADS USE 4 PER DAY 11/14/14   Elayne Snare, MD  Ascorbic Acid (VITAMIN C) 100 MG tablet Take 500 mg by mouth daily.     [provider]  aspirin 81 MG tablet Take 81 mg by mouth daily.    [provider]  BD PEN NEEDLE NANO U/F 32G X 4 MM MISC USE AS DIRECTED FIVE TIMES A DAY. 03/03/16   Elayne Snare, MD  brimonidine (ALPHAGAN P) 0.1 % SOLN Place 1 drop into the left eye 2 (two) times daily.    [provider]  Continuous Blood Gluc Receiver (FREESTYLE LIBRE 14 DAY READER) DEVI USE DAILY TO CHECK BLOOD SUGAR 09/24/20   Elayne Snare, MD  Continuous Blood Gluc Sensor (FREESTYLE LIBRE 14 DAY SENSOR) MISC Use as instructed  to check blood sugar daily 09/24/20   Elayne Snare, MD  cycloSPORINE (RESTASIS) 0.05 % ophthalmic emulsion 1 drop 2 (two) times daily.    [provider]  dorzolamidel-timolol (COSOPT) 22.3-6.8 MG/ML SOLN ophthalmic solution Place 1 drop into the left eye 2 (two) times daily.    [provider]  fish oil-omega-3 fatty acids 1000 MG capsule Take 2 g by mouth daily. Two a day    [provider]  gabapentin (NEURONTIN) 100 MG capsule TAKE 3 TO 4 CAPSULES BY MOUTH AT BEDTIME. 10/10/20   Elayne Snare, MD  levothyroxine (SYNTHROID) 75 MCG tablet TAKE 1 TABLET BY MOUTH DAILY BEFORE BREAKFAST, EXCEPT TAKE 1 AND 1/2 TABLETS ON SUNDAY. 09/23/20   Elayne Snare, MD  losartan (COZAAR) 50 MG tablet TAKE 1 TABLET BY MOUTH  DAILY. 10/10/20   Elayne Snare, MD  lovastatin (MEVACOR) 40 MG tablet TAKE 1 TABLET BY MOUTH ONCE DAILY WITH A MEAL. 05/20/20   Elayne Snare, MD  Multiple Vitamins-Minerals (PRESERVISION AREDS 2) CAPS Take by mouth.    [provider]  NOVOLOG FLEXPEN 100 UNIT/ML FlexPen INJECT 3 TO 5 UNITS SUBCUTANEOUSLY BEFORE MEALS AS DIRECTED. 05/30/20   Elayne Snare, MD  Omega-3 1000 MG CAPS Take 1 capsule by mouth in the morning, at noon, and at bedtime.    [provider]  [facility] VERIO test strip USE TO TEST BLOOD SUGAR 4 TIMES DAILY 09/25/20   Elayne Snare, MD  pantoprazole (PROTONIX) 40 MG tablet Take 40 mg by mouth 2 (two) times daily.    [provider]  sucralfate (CARAFATE) 1 g tablet Take 1 g by mouth 2 (two) times daily as needed.    [provider]  TRESIBA FLEXTOUCH 100 UNIT/ML FlexTouch Pen INJECT 0.11 MLS (11 UNITS TOTAL) INTO THE SKIN DAILY. 10/10/20   Elayne Snare, MD  Vitamin D, Ergocalciferol, (DRISDOL) 1.25 MG (50000 UNIT) CAPS capsule TAKE 1 CAPSULE BY MOUTH ONCE A MONTH. 08/15/20   Elayne Snare, MD    Allergies:   Allergies  Allergen Reactions  . Macrodantin [Nitrofurantoin Macrocrystal] Swelling  . Prednisone     Make real "shaky-like, nervious"  . Penicillins Rash    Has patient had a PCN reaction causing immediate rash, facial/tongue/throat swelling, SOB or lightheadedness with hypotension:Yes Has patient had a PCN reaction causing severe rash involving mucus membranes or skin necrosis: No Has patient had a PCN reaction that required hospitalization: No Has patient had a PCN reaction occurring within the last 10 years:No If all of the above answers are "NO", then may proceed with Cephalosporin use.     Social History:  reports that she has never smoked. She has never used smokeless tobacco. She reports that she does not drink alcohol and does not use drugs.  Family History: Family History  Problem Relation Age of Onset  . Cancer Mother   .  Diabetes Brother      Objective   Physical Exam: Blood pressure (!) 130/40, pulse 68, temperature 98.2 F (36.8 C), resp. rate 16, height 4\' 10"  (1.473 m), weight 58.1 kg, SpO2 97 %.  Physical Exam Vitals and nursing note reviewed.  Constitutional:      Appearance: Normal appearance.  HENT:     Head: Normocephalic and atraumatic.  Eyes:     Conjunctiva/sclera: Conjunctivae normal.  Cardiovascular:     Rate and Rhythm: Normal rate and regular rhythm.  Pulmonary:     Effort: Pulmonary effort is normal.     Breath sounds: Normal  breath sounds.  Abdominal:     General: Abdomen is flat.     Palpations: Abdomen is soft.  Musculoskeletal:     Comments: RLE with brace Pulses intact bilaterally Able to move her toes and has intact sensation  Skin:    Coloration: Skin is not jaundiced or pale.  Neurological:     Mental Status: She is alert. Mental status is at baseline.  Psychiatric:        Mood and Affect: Mood normal.        Behavior: Behavior normal.     LABS on Admission: I have personally reviewed all the labs and imaging below    Basic Metabolic Panel: Recent Labs  Lab 12/02/20 1208  NA 138  K 3.8  CL 106  CO2 24  GLUCOSE 148*  BUN 18  CREATININE 0.68  CALCIUM 8.9   Liver Function Tests: Recent Labs  Lab 12/02/20 1208  AST 24  ALT 17  ALKPHOS 63  BILITOT 0.6  PROT 6.4*  ALBUMIN 3.6   No results for input(s): LIPASE, AMYLASE in the last 168 hours. No results for input(s): AMMONIA in the last 168 hours. CBC: Recent Labs  Lab 12/02/20 1208  WBC 14.6*  NEUTROABS 11.9*  HGB 10.9*  HCT 32.5*  MCV 91.3  PLT 269   Cardiac Enzymes: No results for input(s): CKTOTAL, CKMB, CKMBINDEX, TROPONINI in the last 168 hours. BNP: Invalid input(s): POCBNP CBG: No results for input(s): GLUCAP in the last 168 hours.  Radiological Exams on Admission:  DG Tibia/Fibula Right  Result Date: 12/02/2020 CLINICAL DATA:  Fall with right leg pain EXAM: RIGHT KNEE -  COMPLETE 4+ VIEW; RIGHT TIBIA AND FIBULA - 2 VIEW; RIGHT ANKLE - COMPLETE 3+ VIEW COMPARISON:  None. FINDINGS: Acute transversely oriented fracture through the proximal tibial metaphysis with slight anterior apex angulation. No evidence of fracture extension to the articular surface of the tibiofemoral joint. Comminuted fracture of the fibular neck with minimal medial displacement. Trace knee joint effusion without evidence of lipohemarthrosis. Prominent soft tissue swelling overlying the proximal tibial and fibular fracture sites. Ankle mortise is congruent without evidence of fracture or malalignment. No soft tissue swelling at the ankle. IMPRESSION: 1. Acute transversely oriented fracture through the proximal tibial metaphysis with slight anterior apex angulation. 2. Comminuted fracture of the fibular neck with minimal medial displacement. 3. Right ankle is intact without fracture or malalignment. Electronically Signed   By: Davina Poke D.O.   On: 12/02/2020 11:42   DG Ankle Complete Right  Result Date: 12/02/2020 CLINICAL DATA:  Fall with right leg pain EXAM: RIGHT KNEE - COMPLETE 4+ VIEW; RIGHT TIBIA AND FIBULA - 2 VIEW; RIGHT ANKLE - COMPLETE 3+ VIEW COMPARISON:  None. FINDINGS: Acute transversely oriented fracture through the proximal tibial metaphysis with slight anterior apex angulation. No evidence of fracture extension to the articular surface of the tibiofemoral joint. Comminuted fracture of the fibular neck with minimal medial displacement. Trace knee joint effusion without evidence of lipohemarthrosis. Prominent soft tissue swelling overlying the proximal tibial and fibular fracture sites. Ankle mortise is congruent without evidence of fracture or malalignment. No soft tissue swelling at the ankle. IMPRESSION: 1. Acute transversely oriented fracture through the proximal tibial metaphysis with slight anterior apex angulation. 2. Comminuted fracture of the fibular neck with minimal medial  displacement. 3. Right ankle is intact without fracture or malalignment. Electronically Signed   By: Davina Poke D.O.   On: 12/02/2020 11:42   DG Chest Portable 1 View  Result Date: 12/02/2020 CLINICAL DATA:  Preop evaluation for upcoming leg surgery EXAM: PORTABLE CHEST 1 VIEW COMPARISON:  12/29/2010 FINDINGS: Cardiac shadows within normal limits. Mild aortic calcifications are seen. Lungs are well aerated bilaterally. Mild scarring in the left base is again seen. No acute bony abnormality is noted. IMPRESSION: Mild scarring in the left base.  No acute abnormality noted. Electronically Signed   By: Inez Catalina M.D.   On: 12/02/2020 12:43   DG Knee Complete 4 Views Right  Result Date: 12/02/2020 CLINICAL DATA:  Fall with right leg pain EXAM: RIGHT KNEE - COMPLETE 4+ VIEW; RIGHT TIBIA AND FIBULA - 2 VIEW; RIGHT ANKLE - COMPLETE 3+ VIEW COMPARISON:  None. FINDINGS: Acute transversely oriented fracture through the proximal tibial metaphysis with slight anterior apex angulation. No evidence of fracture extension to the articular surface of the tibiofemoral joint. Comminuted fracture of the fibular neck with minimal medial displacement. Trace knee joint effusion without evidence of lipohemarthrosis. Prominent soft tissue swelling overlying the proximal tibial and fibular fracture sites. Ankle mortise is congruent without evidence of fracture or malalignment. No soft tissue swelling at the ankle. IMPRESSION: 1. Acute transversely oriented fracture through the proximal tibial metaphysis with slight anterior apex angulation. 2. Comminuted fracture of the fibular neck with minimal medial displacement. 3. Right ankle is intact without fracture or malalignment. Electronically Signed   By: Davina Poke D.O.   On: 12/02/2020 11:42      EKG: normal EKG, normal sinus rhythm   A & P   Principal Problem:   Tibia/fibula fracture Active Problems:   Hypertension associated with diabetes ([place])    Hyperlipidemia associated with type 2 diabetes mellitus ([place])   Diabetic polyneuropathy ([place])   Fall at home, initial encounter   1. Right tibia/fibular fracture s/p mechanical fall down 3 stairs with history of osteoporosis a. CT RLE pending b. N.p.o. for possible procedure today c. Management per orthopedic surgery d. Transfer to [facility] for Dr. Marcelino Scot and Dr. Doreatha Martin to evaluate  2. Hypertension a. Holding p.o. meds and likely elevated due to pain b. Continue pain control c. As needed metoprolol  3. Diabetes with neuropathy a. Holding home meds b. Currently controlled, holding insulin while n.p.o.  4. Hyperlipidemia a. Holding home lovastatin  5. Leukocytosis, likely reactive     DVT prophylaxis: Heparin   Code Status: Full Code  Diet: N.p.o. Family Communication: Admission, patients condition and plan of care including tests being ordered have been discussed with the patient who indicates understanding and agrees with the plan and Code Status. Patient's daughter was updated  Disposition Plan: The appropriate patient status for this patient is INPATIENT. Inpatient status is judged to be reasonable and necessary in order to provide the required intensity of service to ensure the patient's safety. The patient's presenting symptoms, physical exam findings, and initial radiographic and laboratory data in the context of their chronic comorbidities is felt to place them at high risk for further clinical deterioration. Furthermore, it is not anticipated that the patient will be medically stable for discharge from the hospital within 2 midnights of admission. The following factors support the patient status of inpatient.   " The patient's presenting symptoms include fall. " The worrisome physical exam findings include right leg pain. " The initial radiographic and laboratory data are worrisome because of right lower extremity fracture. " The chronic co-morbidities include hypertension,  hyperlipidemia, diabetes.   * I certify that at the point of admission it is my clinical judgment  that the patient will require inpatient hospital care spanning beyond 2 midnights from the point of admission due to high intensity of service, high risk for further deterioration and high frequency of surveillance required.*   Status is: Inpatient  Remains inpatient appropriate because:Ongoing active pain requiring inpatient pain management, Ongoing diagnostic testing needed not appropriate for outpatient work up, Unsafe d/c plan and Inpatient level of care appropriate due to severity of illness   Dispo: The patient is from: Home              Anticipated d/c is to: SNF              Patient currently is not medically stable to d/c.   Difficult to place patient No     Consultants  . Orthopedic surgery  Procedures  . none  Time Spent on Admission: 60 minutes    Harold Hedge, DO [facility]  12/02/2020, 1:04 PM

## 2020-12-02 NOTE — ED Notes (Signed)
carelink report provided

## 2020-12-02 NOTE — Consult Note (Signed)
Reason for Consult:Right tibia plateau fx Referring Physician: Lodema Hong Time called: [id number] Time at bedside: 66   Jennifer Bailey is an 86 y.o. female.  HPI: Jennifer Bailey was on her porch and thinks she slipped on some ice. She fell and her right leg got caught underneath her. She had immediate knee pain and could not get up or bear weight. She was brought to [facility] ED where x-rays showed a tibia plateau fx. Orthopedic surgery was consulted and requested orthopedic trauma involvement. She was transferred to [facility] for definitive care. She lives with her daughter and does not use any assistive devices to ambulate.  Past Medical History:  Diagnosis Date  . Diabetes mellitus without complication ([place])   . Thyroid disease     Past Surgical History:  Procedure Laterality Date  . ABDOMINAL HYSTERECTOMY    . CHOLECYSTECTOMY    . COLON SURGERY      Family History  Problem Relation Age of Onset  . Cancer Mother   . Diabetes Brother     Social History:  reports that she has never smoked. She has never used smokeless tobacco. She reports that she does not drink alcohol and does not use drugs.  Allergies:  Allergies  Allergen Reactions  . Macrodantin [Nitrofurantoin Macrocrystal] Swelling  . Prednisone     Make real "shaky-like, nervious"  . Penicillins Rash    Has patient had a PCN reaction causing immediate rash, facial/tongue/throat swelling, SOB or lightheadedness with hypotension:Yes Has patient had a PCN reaction causing severe rash involving mucus membranes or skin necrosis: No Has patient had a PCN reaction that required hospitalization: No Has patient had a PCN reaction occurring within the last 10 years:No If all of the above answers are "NO", then may proceed with Cephalosporin use.     Medications: I have reviewed the patient's current medications.  Results for orders placed or performed during the hospital encounter of 12/02/20 (from the past 48 hour(s))  CBC with Differential      Status: Abnormal   Collection Time: 12/02/20 12:08 PM  Result Value Ref Range   WBC 14.6 (H) 4.0 - 10.5 K/uL   RBC 3.56 (L) 3.87 - 5.11 MIL/uL   Hemoglobin 10.9 (L) 12.0 - 15.0 g/dL   HCT 32.5 (L) 36.0 - 46.0 %   MCV 91.3 80.0 - 100.0 fL   MCH 30.6 26.0 - 34.0 pg   MCHC 33.5 30.0 - 36.0 g/dL   RDW 13.8 11.5 - 15.5 %   Platelets 269 150 - 400 K/uL   nRBC 0.0 0.0 - 0.2 %   Neutrophils Relative % 81 %   Neutro Abs 11.9 (H) 1.7 - 7.7 K/uL   Lymphocytes Relative 10 %   Lymphs Abs 1.4 0.7 - 4.0 K/uL   Monocytes Relative 6 %   Monocytes Absolute 0.9 0.1 - 1.0 K/uL   Eosinophils Relative 2 %   Eosinophils Absolute 0.2 0.0 - 0.5 K/uL   Basophils Relative 0 %   Basophils Absolute 0.1 0.0 - 0.1 K/uL   Immature Granulocytes 1 %   Abs Immature Granulocytes 0.11 (H) 0.00 - 0.07 K/uL    Comment: Performed at [facility], [place] [address] [place], [place] [address]  Comprehensive metabolic panel     Status: Abnormal   Collection Time: 12/02/20 12:08 PM  Result Value Ref Range   Sodium 138 135 - 145 mmol/L   Potassium 3.8 3.5 - 5.1 mmol/L   Chloride 106 98 - 111 mmol/L  CO2 24 22 - 32 mmol/L   Glucose, Bld 148 (H) 70 - 99 mg/dL    Comment: Glucose reference range applies only to samples taken after fasting for at least 8 hours.   BUN 18 8 - 23 mg/dL   Creatinine, Ser 0.68 0.44 - 1.00 mg/dL   Calcium 8.9 8.9 - 10.3 mg/dL   Total Protein 6.4 (L) 6.5 - 8.1 g/dL   Albumin 3.6 3.5 - 5.0 g/dL   AST 24 15 - 41 U/L   ALT 17 0 - 44 U/L   Alkaline Phosphatase 63 38 - 126 U/L   Total Bilirubin 0.6 0.3 - 1.2 mg/dL   GFR, Estimated >60 >60 mL/min    Comment: (NOTE) Calculated using the CKD-EPI Creatinine Equation (2021)    Anion gap 8 5 - 15    Comment: Performed at [facility], [place] [address], [place], [place] [address]  Protime-INR     Status: None   Collection Time: 12/02/20 12:08 PM  Result Value Ref Range   Prothrombin Time 12.8 11.4 - 15.2  seconds   INR 1.0 0.8 - 1.2    Comment: (NOTE) INR goal varies based on device and disease states. Performed at [facility], [place] [address], [place], [place] [address]   Resp Panel by RT-PCR (Flu A&B, Covid) Nasopharyngeal Swab     Status: None   Collection Time: 12/02/20 12:17 PM   Specimen: Nasopharyngeal Swab; Nasopharyngeal(NP) swabs in vial transport medium  Result Value Ref Range   SARS Coronavirus 2 by RT PCR NEGATIVE NEGATIVE    Comment: (NOTE) SARS-CoV-2 target nucleic acids are NOT DETECTED.  The SARS-CoV-2 RNA is generally detectable in upper respiratory specimens during the acute phase of infection. The lowest concentration of SARS-CoV-2 viral copies this assay can detect is 138 copies/mL. A negative result does not preclude SARS-Cov-2 infection and should not be used as the sole basis for treatment or other patient management decisions. A negative result may occur with  improper specimen collection/handling, submission of specimen other than nasopharyngeal swab, presence of viral mutation(s) within the areas targeted by this assay, and inadequate number of viral copies(<138 copies/mL). A negative result must be combined with clinical observations, patient history, and epidemiological information. The expected result is Negative.  Fact Sheet for Patients:  EntrepreneurPulse.com.au  Fact Sheet for Healthcare Providers:  IncredibleEmployment.be  This test is no t yet approved or cleared by the [place] FDA and  has been authorized for detection and/or diagnosis of SARS-CoV-2 by FDA under an Emergency Use Authorization (EUA). This EUA will remain  in effect (meaning this test can be used) for the duration of the COVID-19 declaration under Section 564(b)(1) of the Act, 21 U.S.C.section 360bbb-3(b)(1), unless the authorization is terminated  or revoked sooner.       Influenza A by PCR NEGATIVE NEGATIVE    Influenza B by PCR NEGATIVE NEGATIVE    Comment: (NOTE) The Xpert Xpress SARS-CoV-2/FLU/RSV plus assay is intended as an aid in the diagnosis of influenza from Nasopharyngeal swab specimens and should not be used as a sole basis for treatment. Nasal washings and aspirates are unacceptable for Xpert Xpress SARS-CoV-2/FLU/RSV testing.  Fact Sheet for Patients: EntrepreneurPulse.com.au  Fact Sheet for Healthcare Providers: IncredibleEmployment.be  This test is not yet approved or cleared by the [place] FDA and has been authorized for detection and/or diagnosis of SARS-CoV-2 by FDA under an Emergency Use Authorization (EUA). This EUA will remain in effect (meaning this  test can be used) for the duration of the COVID-19 declaration under Section 564(b)(1) of the Act, 21 U.S.C. section 360bbb-3(b)(1), unless the authorization is terminated or revoked.  Performed at [facility], [place] [address] [place], [place] [address]   Glucose, capillary     Status: Abnormal   Collection Time: 12/02/20  2:11 PM  Result Value Ref Range   Glucose-Capillary 102 (H) 70 - 99 mg/dL    Comment: Glucose reference range applies only to samples taken after fasting for at least 8 hours.    DG Tibia/Fibula Right  Result Date: 12/02/2020 CLINICAL DATA:  Fall with right leg pain EXAM: RIGHT KNEE - COMPLETE 4+ VIEW; RIGHT TIBIA AND FIBULA - 2 VIEW; RIGHT ANKLE - COMPLETE 3+ VIEW COMPARISON:  None. FINDINGS: Acute transversely oriented fracture through the proximal tibial metaphysis with slight anterior apex angulation. No evidence of fracture extension to the articular surface of the tibiofemoral joint. Comminuted fracture of the fibular neck with minimal medial displacement. Trace knee joint effusion without evidence of lipohemarthrosis. Prominent soft tissue swelling overlying the proximal tibial and fibular fracture sites. Ankle mortise is congruent  without evidence of fracture or malalignment. No soft tissue swelling at the ankle. IMPRESSION: 1. Acute transversely oriented fracture through the proximal tibial metaphysis with slight anterior apex angulation. 2. Comminuted fracture of the fibular neck with minimal medial displacement. 3. Right ankle is intact without fracture or malalignment. Electronically Signed   By: Davina Poke D.O.   On: 12/02/2020 11:42   DG Ankle Complete Right  Result Date: 12/02/2020 CLINICAL DATA:  Fall with right leg pain EXAM: RIGHT KNEE - COMPLETE 4+ VIEW; RIGHT TIBIA AND FIBULA - 2 VIEW; RIGHT ANKLE - COMPLETE 3+ VIEW COMPARISON:  None. FINDINGS: Acute transversely oriented fracture through the proximal tibial metaphysis with slight anterior apex angulation. No evidence of fracture extension to the articular surface of the tibiofemoral joint. Comminuted fracture of the fibular neck with minimal medial displacement. Trace knee joint effusion without evidence of lipohemarthrosis. Prominent soft tissue swelling overlying the proximal tibial and fibular fracture sites. Ankle mortise is congruent without evidence of fracture or malalignment. No soft tissue swelling at the ankle. IMPRESSION: 1. Acute transversely oriented fracture through the proximal tibial metaphysis with slight anterior apex angulation. 2. Comminuted fracture of the fibular neck with minimal medial displacement. 3. Right ankle is intact without fracture or malalignment. Electronically Signed   By: Davina Poke D.O.   On: 12/02/2020 11:42   CT Knee Right Wo Contrast  Result Date: 12/02/2020 CLINICAL DATA:  Fall down stairs 3 days ago.  Tibial fracture. EXAM: CT OF THE RIGHT KNEE WITHOUT CONTRAST TECHNIQUE: Multidetector CT imaging of the right knee was performed according to the standard protocol. Multiplanar CT image reconstructions were also generated. COMPARISON:  Radiographs 12/02/2020 FINDINGS: Bones/Joint/Cartilage Transverse, minimally  comminuted fracture through the proximal tibial metaphysis demonstrates apex anterior angulation with 14 mm of fracture distraction anteriorly. There is up to 13 mm of posterior displacement of the distal fragment. This fracture extends into the tibiofibular articulation, and likely extends anteriorly toward the articular surface of the proximal tibia. There is no definite involvement or displacement of the weight-bearing articular surface. There is a mildly displaced fracture of the fibular head. The distal femur and patella are intact. There is no joint effusion or lipohemarthrosis. Ligaments Suboptimally assessed by CT.  The cruciate ligaments appear intact. Muscles and Tendons The extensor mechanism is intact. The patellar tendon insertion on the tibial  tubercle is just proximal to the fracture. Soft tissues Prepatellar soft tissue swelling. No foreign body or soft tissue emphysema. IMPRESSION: 1. Transverse, minimally comminuted fracture through the proximal tibial metaphysis as described. This fracture is mildly displaced and is situated just distal to insertion of the patellar tendon on the tibial tubercle. There is probable proximal extension of a nondisplaced component anteriorly towards the articular surface. 2. Mildly displaced fracture of the fibular head. 3. Intact femur. Electronically Signed   By: Richardean Sale M.D.   On: 12/02/2020 13:49   DG Chest Portable 1 View  Result Date: 12/02/2020 CLINICAL DATA:  Preop evaluation for upcoming leg surgery EXAM: PORTABLE CHEST 1 VIEW COMPARISON:  12/29/2010 FINDINGS: Cardiac shadows within normal limits. Mild aortic calcifications are seen. Lungs are well aerated bilaterally. Mild scarring in the left base is again seen. No acute bony abnormality is noted. IMPRESSION: Mild scarring in the left base.  No acute abnormality noted. Electronically Signed   By: Inez Catalina M.D.   On: 12/02/2020 12:43   DG Knee Complete 4 Views Right  Result Date:  12/02/2020 CLINICAL DATA:  Fall with right leg pain EXAM: RIGHT KNEE - COMPLETE 4+ VIEW; RIGHT TIBIA AND FIBULA - 2 VIEW; RIGHT ANKLE - COMPLETE 3+ VIEW COMPARISON:  None. FINDINGS: Acute transversely oriented fracture through the proximal tibial metaphysis with slight anterior apex angulation. No evidence of fracture extension to the articular surface of the tibiofemoral joint. Comminuted fracture of the fibular neck with minimal medial displacement. Trace knee joint effusion without evidence of lipohemarthrosis. Prominent soft tissue swelling overlying the proximal tibial and fibular fracture sites. Ankle mortise is congruent without evidence of fracture or malalignment. No soft tissue swelling at the ankle. IMPRESSION: 1. Acute transversely oriented fracture through the proximal tibial metaphysis with slight anterior apex angulation. 2. Comminuted fracture of the fibular neck with minimal medial displacement. 3. Right ankle is intact without fracture or malalignment. Electronically Signed   By: Davina Poke D.O.   On: 12/02/2020 11:42    Review of Systems  HENT: Negative for ear discharge, ear pain, hearing loss and tinnitus.   Eyes: Negative for photophobia and pain.  Respiratory: Negative for cough and shortness of breath.   Cardiovascular: Negative for chest pain.  Gastrointestinal: Negative for abdominal pain, nausea and vomiting.  Genitourinary: Negative for dysuria, flank pain, frequency and urgency.  Musculoskeletal: Positive for arthralgias (Right knee). Negative for back pain, myalgias and neck pain.  Neurological: Negative for dizziness and headaches.  Hematological: Does not bruise/bleed easily.  Psychiatric/Behavioral: The patient is not nervous/anxious.    Blood pressure (!) 143/77, pulse 76, temperature 98.4 F (36.9 C), temperature source Oral, resp. rate 16, height 4\' 10"  (1.473 m), weight 58.1 kg, SpO2 99 %. Physical Exam Constitutional:      General: She is not in acute  distress.    Appearance: She is well-developed and well-nourished. She is not diaphoretic.  HENT:     Head: Normocephalic and atraumatic.  Eyes:     General: No scleral icterus.       Right eye: No discharge.        Left eye: No discharge.     Conjunctiva/sclera: Conjunctivae normal.  Cardiovascular:     Rate and Rhythm: Normal rate and regular rhythm.  Pulmonary:     Effort: Pulmonary effort is normal. No respiratory distress.  Musculoskeletal:     Cervical back: Normal range of motion.     Comments: RLE No traumatic wounds, ecchymosis,  or rash  Mod TTP knee, compartments soft  No ankle effusion  Sens DPN, SPN, TN intact  Motor EHL, ext, flex, evers 5/5  DP 1+, PT 0, No significant edema  Skin:    General: Skin is warm and dry.  Neurological:     Mental Status: She is alert.  Psychiatric:        Mood and Affect: Mood and affect normal.        Behavior: Behavior normal.     Assessment/Plan: Right tibia plateau fx -- Plan ORIF tomorrow by Dr. Marcelino Scot. Please keep NPO after MN. Multiple medical problems including diabetes with diabetic neuropathy, hypertension, hyperlipidemia, osteoporosis, and hypothyroid -- per primary service    Lisette Abu, PA-C Orthopedic Surgery [phone] 12/02/2020, 2:33 PM

## 2020-12-02 NOTE — ED Notes (Signed)
[facility]ed [facility] for transfer

## 2020-12-02 NOTE — ED Notes (Signed)
[facility] here to get patient.

## 2020-12-02 NOTE — ED Provider Notes (Signed)
[facility] DEPT Provider Note   CSN: [id number] Arrival date & time: 12/02/20  3536     History Chief Complaint  Patient presents with  . Fall    ERABELLA Bailey is a 86 y.o. female.  The history is provided by the patient and medical records. No language interpreter was used.  Fall This is a new problem. The current episode started less than 1 hour ago. The problem occurs constantly. The problem has not changed since onset.Pertinent negatives include no chest pain, no abdominal pain, no headaches and no shortness of breath. The symptoms are aggravated by standing and bending. Nothing relieves the symptoms. She has tried nothing for the symptoms. The treatment provided no relief.       Past Medical History:  Diagnosis Date  . Diabetes mellitus without complication ([place])   . Thyroid disease     Patient Active Problem List   Diagnosis Date Noted  . Anemia 12/13/2019  . Widowed 07/09/2019  . Family history of diabetes mellitus in brother 07/09/2019  . Tremulousness of voice 07/09/2019  . Family history of colon cancer- in her brother 07/09/2019  . Acquired autoimmune hypothyroidism 08/20/2014  . Type II diabetes mellitus, uncontrolled ([place]) 08/20/2014  . Osteoporosis 11/20/2013  . Diabetic polyneuropathy ([place]) 08/14/2013  . Hypertension associated with diabetes ([place]) 06/14/2013  . Hyperlipidemia associated with type 2 diabetes mellitus ([place]) 06/14/2013    Past Surgical History:  Procedure Laterality Date  . ABDOMINAL HYSTERECTOMY    . CHOLECYSTECTOMY    . COLON SURGERY       OB History   No obstetric history on file.     Family History  Problem Relation Age of Onset  . Cancer Mother   . Diabetes Brother     Social History   Tobacco Use  . Smoking status: Never Smoker  . Smokeless tobacco: Never Used  Vaping Use  . Vaping Use: Never used  Substance Use Topics  . Alcohol use: Never  . Drug use: Never    Home  Medications Prior to Admission medications   Medication Sig Start Date End Date Taking? Authorizing Provider  Alcohol Swabs (ALCOHOL PADS) 70 % PADS USE 4 PER DAY 11/14/14   Elayne Snare, MD  Ascorbic Acid (VITAMIN C) 100 MG tablet Take 500 mg by mouth daily.     [provider]  aspirin 81 MG tablet Take 81 mg by mouth daily.    [provider]  BD PEN NEEDLE NANO U/F 32G X 4 MM MISC USE AS DIRECTED FIVE TIMES A DAY. 03/03/16   Elayne Snare, MD  brimonidine (ALPHAGAN P) 0.1 % SOLN Place 1 drop into the left eye 2 (two) times daily.    [provider]  Continuous Blood Gluc Receiver (FREESTYLE LIBRE 14 DAY READER) DEVI USE DAILY TO CHECK BLOOD SUGAR 09/24/20   Elayne Snare, MD  Continuous Blood Gluc Sensor (FREESTYLE LIBRE 14 DAY SENSOR) MISC Use as instructed to check blood sugar daily 09/24/20   Elayne Snare, MD  cycloSPORINE (RESTASIS) 0.05 % ophthalmic emulsion 1 drop 2 (two) times daily.    [provider]  dorzolamidel-timolol (COSOPT) 22.3-6.8 MG/ML SOLN ophthalmic solution Place 1 drop into the left eye 2 (two) times daily.    [provider]  fish oil-omega-3 fatty acids 1000 MG capsule Take 2 g by mouth daily. Two a day    [provider]  gabapentin (NEURONTIN) 100 MG capsule TAKE 3 TO 4 CAPSULES BY MOUTH  AT BEDTIME. 10/10/20   Elayne Snare, MD  levothyroxine (SYNTHROID) 75 MCG tablet TAKE 1 TABLET BY MOUTH DAILY BEFORE BREAKFAST, EXCEPT TAKE 1 AND 1/2 TABLETS ON SUNDAY. 09/23/20   Elayne Snare, MD  losartan (COZAAR) 50 MG tablet TAKE 1 TABLET BY MOUTH DAILY. 10/10/20   Elayne Snare, MD  lovastatin (MEVACOR) 40 MG tablet TAKE 1 TABLET BY MOUTH ONCE DAILY WITH A MEAL. 05/20/20   Elayne Snare, MD  Multiple Vitamins-Minerals (PRESERVISION AREDS 2) CAPS Take by mouth.    [provider]  NOVOLOG FLEXPEN 100 UNIT/ML FlexPen INJECT 3 TO 5 UNITS SUBCUTANEOUSLY BEFORE MEALS AS DIRECTED. 05/30/20   Elayne Snare, MD  Omega-3 1000 MG CAPS Take 1  capsule by mouth in the morning, at noon, and at bedtime.    [provider]  [facility] VERIO test strip USE TO TEST BLOOD SUGAR 4 TIMES DAILY 09/25/20   Elayne Snare, MD  pantoprazole (PROTONIX) 40 MG tablet Take 40 mg by mouth 2 (two) times daily.    [provider]  sucralfate (CARAFATE) 1 g tablet Take 1 g by mouth 2 (two) times daily as needed.    [provider]  TRESIBA FLEXTOUCH 100 UNIT/ML FlexTouch Pen INJECT 0.11 MLS (11 UNITS TOTAL) INTO THE SKIN DAILY. 10/10/20   Elayne Snare, MD  Vitamin D, Ergocalciferol, (DRISDOL) 1.25 MG (50000 UNIT) CAPS capsule TAKE 1 CAPSULE BY MOUTH ONCE A MONTH. 08/15/20   Elayne Snare, MD    Allergies    Macrodantin [nitrofurantoin macrocrystal], Prednisone, and Penicillins  Review of Systems   Review of Systems  Constitutional: Negative for chills, fatigue and fever.  HENT: Negative for congestion.   Eyes: Negative for visual disturbance.  Respiratory: Negative for chest tightness, shortness of breath and wheezing.   Cardiovascular: Positive for leg swelling (r knee). Negative for chest pain and palpitations.  Gastrointestinal: Negative for abdominal pain, constipation, diarrhea, nausea and vomiting.  Musculoskeletal: Negative for back pain, neck pain and neck stiffness.  Skin: Positive for wound (abrasions to R knee and R ankle).  Neurological: Negative for speech difficulty, weakness, light-headedness, numbness and headaches.  Psychiatric/Behavioral: Negative for agitation.  All other systems reviewed and are negative.   Physical Exam Updated Vital Signs BP (!) 181/62 (BP Location: Right Arm)   Pulse 84   Temp 98 F (36.7 C) (Oral)   Resp 18   Ht 4\' 10"  (1.473 m)   Wt 58.1 kg   SpO2 98%   BMI 26.75 kg/m   Physical Exam Vitals and nursing note reviewed.  Constitutional:      General: She is not in acute distress.    Appearance: She is well-developed and well-nourished. She is not ill-appearing, toxic-appearing  or diaphoretic.  HENT:     Head: Normocephalic and atraumatic.     Nose: No congestion or rhinorrhea.     Mouth/Throat:     Mouth: Mucous membranes are moist.     Pharynx: No oropharyngeal exudate or posterior oropharyngeal erythema.  Eyes:     Conjunctiva/sclera: Conjunctivae normal.     Pupils: Pupils are equal, round, and reactive to light.  Cardiovascular:     Rate and Rhythm: Normal rate and regular rhythm.     Pulses: Normal pulses.     Heart sounds: No murmur heard.   Pulmonary:     Effort: Pulmonary effort is normal. No respiratory distress.     Breath sounds: Normal breath sounds. No wheezing, rhonchi or rales.  Chest:     Chest  wall: No tenderness.  Abdominal:     Palpations: Abdomen is soft.     Tenderness: There is no abdominal tenderness. There is no right CVA tenderness, left CVA tenderness, guarding or rebound.  Musculoskeletal:        General: Swelling, tenderness and signs of injury present. No edema.     Cervical back: Neck supple. No tenderness.     Right knee: Swelling present. Tenderness present.     Right lower leg: Swelling and tenderness present.     Right ankle: Tenderness present. Normal pulse.       Legs:     Comments: Intact sensation and strength in the foot and toes.  Intact pulses in the DP and PT arteries.  Skin:    General: Skin is warm and dry.     Capillary Refill: Capillary refill takes less than 2 seconds.     Findings: Bruising present. No erythema.  Neurological:     Mental Status: She is alert and oriented to person, place, and time.     Sensory: No sensory deficit.     Motor: No weakness.  Psychiatric:        Mood and Affect: Mood and affect and mood normal.     ED Results / Procedures / Treatments   Labs (all labs ordered are listed, but only abnormal results are displayed) Labs Reviewed  CBC WITH DIFFERENTIAL/PLATELET - Abnormal; Notable for the following components:      Result Value   WBC 14.6 (*)    RBC 3.56 (*)     Hemoglobin 10.9 (*)    HCT 32.5 (*)    Neutro Abs 11.9 (*)    Abs Immature Granulocytes 0.11 (*)    All other components within normal limits  COMPREHENSIVE METABOLIC PANEL - Abnormal; Notable for the following components:   Glucose, Bld 148 (*)    Total Protein 6.4 (*)    All other components within normal limits  RESP PANEL BY RT-PCR (FLU A&B, COVID) [id number]  PROTIME-INR  HEMOGLOBIN [name]    EKG EKG Interpretation  Date/Time:  Monday December 02 2020 12:17:25 EST Ventricular Rate:  77 PR Interval:    QRS Duration: 85 QT Interval:  427 QTC Calculation: 484 R Axis:   76 Text Interpretation: Sinus rhythm Minimal ST depression, diffuse leads when compared to prior, similar appearance. No STEMI Confirmed by Antony Blackbird [phone]) on 12/02/2020 1:16:07 PM   Radiology DG Tibia/Fibula Right  Result Date: 12/02/2020 CLINICAL DATA:  Fall with right leg pain EXAM: RIGHT KNEE - COMPLETE 4+ VIEW; RIGHT TIBIA AND FIBULA - 2 VIEW; RIGHT ANKLE - COMPLETE 3+ VIEW COMPARISON:  None. FINDINGS: Acute transversely oriented fracture through the proximal tibial metaphysis with slight anterior apex angulation. No evidence of fracture extension to the articular surface of the tibiofemoral joint. Comminuted fracture of the fibular neck with minimal medial displacement. Trace knee joint effusion without evidence of lipohemarthrosis. Prominent soft tissue swelling overlying the proximal tibial and fibular fracture sites. Ankle mortise is congruent without evidence of fracture or malalignment. No soft tissue swelling at the ankle. IMPRESSION: 1. Acute transversely oriented fracture through the proximal tibial metaphysis with slight anterior apex angulation. 2. Comminuted fracture of the fibular neck with minimal medial displacement. 3. Right ankle is intact without fracture or malalignment. Electronically Signed   By: Davina Poke D.O.   On: 12/02/2020 11:42   DG Ankle Complete Right  Result Date:  12/02/2020 CLINICAL DATA:  Fall with right leg pain EXAM: RIGHT  KNEE - COMPLETE 4+ VIEW; RIGHT TIBIA AND FIBULA - 2 VIEW; RIGHT ANKLE - COMPLETE 3+ VIEW COMPARISON:  None. FINDINGS: Acute transversely oriented fracture through the proximal tibial metaphysis with slight anterior apex angulation. No evidence of fracture extension to the articular surface of the tibiofemoral joint. Comminuted fracture of the fibular neck with minimal medial displacement. Trace knee joint effusion without evidence of lipohemarthrosis. Prominent soft tissue swelling overlying the proximal tibial and fibular fracture sites. Ankle mortise is congruent without evidence of fracture or malalignment. No soft tissue swelling at the ankle. IMPRESSION: 1. Acute transversely oriented fracture through the proximal tibial metaphysis with slight anterior apex angulation. 2. Comminuted fracture of the fibular neck with minimal medial displacement. 3. Right ankle is intact without fracture or malalignment. Electronically Signed   By: Davina Poke D.O.   On: 12/02/2020 11:42   DG Chest Portable 1 View  Result Date: 12/02/2020 CLINICAL DATA:  Preop evaluation for upcoming leg surgery EXAM: PORTABLE CHEST 1 VIEW COMPARISON:  12/29/2010 FINDINGS: Cardiac shadows within normal limits. Mild aortic calcifications are seen. Lungs are well aerated bilaterally. Mild scarring in the left base is again seen. No acute bony abnormality is noted. IMPRESSION: Mild scarring in the left base.  No acute abnormality noted. Electronically Signed   By: Inez Catalina M.D.   On: 12/02/2020 12:43   DG Knee Complete 4 Views Right  Result Date: 12/02/2020 CLINICAL DATA:  Fall with right leg pain EXAM: RIGHT KNEE - COMPLETE 4+ VIEW; RIGHT TIBIA AND FIBULA - 2 VIEW; RIGHT ANKLE - COMPLETE 3+ VIEW COMPARISON:  None. FINDINGS: Acute transversely oriented fracture through the proximal tibial metaphysis with slight anterior apex angulation. No evidence of fracture extension  to the articular surface of the tibiofemoral joint. Comminuted fracture of the fibular neck with minimal medial displacement. Trace knee joint effusion without evidence of lipohemarthrosis. Prominent soft tissue swelling overlying the proximal tibial and fibular fracture sites. Ankle mortise is congruent without evidence of fracture or malalignment. No soft tissue swelling at the ankle. IMPRESSION: 1. Acute transversely oriented fracture through the proximal tibial metaphysis with slight anterior apex angulation. 2. Comminuted fracture of the fibular neck with minimal medial displacement. 3. Right ankle is intact without fracture or malalignment. Electronically Signed   By: Davina Poke D.O.   On: 12/02/2020 11:42    Procedures Procedures     Medications Ordered in ED Medications  polyethylene glycol (MIRALAX / GLYCOLAX) packet 17 g (has no administration in time range)  heparin injection 5,000 Units (has no administration in time range)  sodium chloride flush (NS) 0.9 % injection 3 mL (3 mLs Intravenous Not Given 12/02/20 1258)  HYDROmorphone (DILAUDID) injection 0.5 mg (has no administration in time range)  metoprolol tartrate (LOPRESSOR) injection 5 mg (has no administration in time range)  fentaNYL (SUBLIMAZE) injection 50 mcg (50 mcg Intravenous Given 12/02/20 1046)  fentaNYL (SUBLIMAZE) injection 50 mcg (50 mcg Intravenous Given 12/02/20 1240)    ED Course  I have reviewed the triage vital signs and the nursing notes.  Pertinent labs & imaging results that were available during my care of the patient were reviewed by me and considered in my medical decision making (see chart for details).    MDM Rules/Calculators/A&P                          CORBYN STEEDMAN is a 86 y.o. female with a past medical history significant for diabetic neuropathy,  hypertension, hyperlipidemia, and osteoporosis who presents with a fall.  Patient reports that she was walking outside today down some stairs  when she slipped on a patch of ice and fell 3 stairs down hitting her right knee and lower leg on the ground.  She did not hit her head or neck and did not have any injury to her torso.  She denies any pain in her hips, neck, back, chest, or abdomen.  She did not lose consciousness.  She denies any pain in her left leg.  She only has pain in her right shin and knee.  She reports she has not been able to walk due to the pain.  Patient reports no history of significant fractures.  She reports her pain and swelling is severe in the right shin area and right knee.  She also pain in the right ankle.  She denies any other preceding symptoms.   On exam, patient has some abrasions to the right knee and right ankle and has significant tenderness and swelling to the right shin and knee.  Patient also has tenderness in the right ankle and right tib-fib area.  She has sensation in the right foot that she reports is at her baseline of neuropathic sensation.  No tenderness in the foot.  Intact strength in the foot.  Intact pulses palpated.  No temperature discrepancy between the legs.  Abdomen nontender, hip nontender.  Lungs clear and chest nontender.  No focal neurologic deficits.  Given lack of any head or neck complaints, will hold on other imaging.  Will focus on the right knee, right shin, and right ankle.  Clinical aspect she will have a tibial plateau fracture or other tib-fib injury.  We will give her fentanyl initially.  Initially, do not suspect compartment syndrome as she had intact sensation at her baseline, strength, pulses, temperature, and appearance.  Anticipate reassessment after work-up to determine further management and disposition.   12:04 PM X-rays unfortunately shows significant tibia and fibular fractures.  Will call orthopedics to see if they want further imaging after recommendation.  Given the amount of swelling and tenderness, patient still does not have compartment syndrome but I am  concerned about this developing in her.  Will discuss with orthopedics about if they feel she would require admission for management.  We will get potential admission and preoperative labs and image including a chest x-ray, EKG, and a Covid test.  We will have her remain n.p.o. at this time  12:14 PM Just spoke to the orthopedics team and spoke to Dr. Adriana Mccallum.  He recommends her be admitted to the hospitalist and transfer to [facility] today and his colleagues Dr. Marcelino Scot and Haddix likely the want to take care of her.  She may be candidate for surgery today if she is able to get a CT scan and transferred over.  He agrees with n.p.o. at this time and to be admitted and seen by medicine for preoperative clearance.  We will get the CT and get her admitted to [facility]  Medicine team will admit to [facility] for further management by orthopedics team.  Final Clinical Impression(s) / ED Diagnoses Final diagnoses:  Fall due to slipping on ice or snow, initial encounter  Closed fracture of proximal end of right tibia, unspecified fracture morphology, initial encounter  Closed fracture of proximal end of right fibula, unspecified fracture morphology, initial encounter    Clinical Impression: 1. Fall due to slipping on ice or snow, initial encounter  2. Closed fracture of proximal end of right tibia, unspecified fracture morphology, initial encounter   3. Closed fracture of proximal end of right fibula, unspecified fracture morphology, initial encounter     Disposition: Admit  This note was prepared with assistance of Dragon voice recognition software. Occasional wrong-word or sound-a-like substitutions may have occurred due to the inherent limitations of voice recognition software.     Tegeler, Gwenyth Allegra, MD 12/02/20 1316

## 2020-12-02 NOTE — ED Triage Notes (Signed)
BIB EMS from home. Fell down 3 steps. Right leg pain with swelling noted. Denies hitting head. Denies neck or back pain. Ambulatory at baseline. +pedal pulses.

## 2020-12-02 NOTE — Progress Notes (Signed)
Pt arrived to unit via [facility] in [place], Pt alert/oriented in no apparent distress. Pt situated/orientated to room/equipments. Welcome guide/menu provided with instructions. Pt/dtr verbalizes understanding of instructions. Hospital valuables policy has been discussed at length with no complaints. Hospital bed in lowest position with 3 side rails up and call bell/room phone within reach, all wheels locked upon leaving the room.

## 2020-12-03 DIAGNOSIS — S82201A Unspecified fracture of shaft of right tibia, initial encounter for closed fracture: Secondary | ICD-10-CM | POA: Diagnosis not present

## 2020-12-03 DIAGNOSIS — S82401A Unspecified fracture of shaft of right fibula, initial encounter for closed fracture: Secondary | ICD-10-CM | POA: Diagnosis not present

## 2020-12-03 LAB — GLUCOSE, CAPILLARY
Glucose-Capillary: 379 mg/dL — ABNORMAL HIGH (ref 70–99)
Glucose-Capillary: 327 mg/dL — ABNORMAL HIGH (ref 70–99)
Glucose-Capillary: 88 mg/dL (ref 70–99)
Glucose-Capillary: 89 mg/dL (ref 70–99)
Glucose-Capillary: 257 mg/dL — ABNORMAL HIGH (ref 70–99)

## 2020-12-03 LAB — BASIC METABOLIC PANEL
Anion gap: 7 (ref 5–15)
BUN: 19 mg/dL (ref 8–23)
CO2: 23 mmol/L (ref 22–32)
Calcium: 8.4 mg/dL — ABNORMAL LOW (ref 8.9–10.3)
Chloride: 103 mmol/L (ref 98–111)
Creatinine, Ser: 0.83 mg/dL (ref 0.44–1.00)
GFR, Estimated: >60 mL/min (ref >60)
Glucose, Bld: 298 mg/dL — ABNORMAL HIGH (ref 70–99)
Potassium: 4 mmol/L (ref 3.5–5.1)
Sodium: 133 mmol/L — ABNORMAL LOW (ref 135–145)

## 2020-12-03 LAB — ABO/RH: ABO/RH(D): O POS

## 2020-12-03 LAB — CBC
HCT: 24.1 % — ABNORMAL LOW (ref 36.0–46.0)
Hemoglobin: 8.4 g/dL — ABNORMAL LOW (ref 12.0–15.0)
MCH: 30.8 pg (ref 26.0–34.0)
MCHC: 34.9 g/dL (ref 30.0–36.0)
MCV: 88.3 fL (ref 80.0–100.0)
Platelets: 219 10*3/uL (ref 150–400)
RBC: 2.73 MIL/uL — ABNORMAL LOW (ref 3.87–5.11)
RDW: 14 % (ref 11.5–15.5)
WBC: 8.5 10*3/uL (ref 4.0–10.5)
nRBC: 0 % (ref 0.0–0.2)

## 2020-12-03 MED ORDER — INSULIN ASPART 100 UNIT/ML ~~LOC~~ SOLN
5.0000 [IU] | Freq: Once | SUBCUTANEOUS | Status: AC
  Administered 2020-12-03: 5 [IU] via SUBCUTANEOUS

## 2020-12-03 MED ORDER — CYCLOSPORINE 0.05 % OP EMUL
1.0000 [drp] | Freq: Two times a day (BID) | OPHTHALMIC | Status: DC
  Administered 2020-12-04 – 2020-12-10 (×12): 1 [drp] via OPHTHALMIC
  Filled 2020-12-03 (×15): qty 1

## 2020-12-03 MED ORDER — POLYVINYL ALCOHOL 1.4 % OP SOLN
1.0000 [drp] | OPHTHALMIC | Status: DC | PRN
  Administered 2020-12-04 – 2020-12-09 (×4): 1 [drp] via OPHTHALMIC
  Filled 2020-12-03: qty 15

## 2020-12-03 MED ORDER — HYDRALAZINE HCL 25 MG PO TABS: 25.0000 mg | ORAL_TABLET | ORAL | Status: DC | PRN

## 2020-12-03 MED ORDER — ENSURE PRE-SURGERY PO LIQD
296.0000 mL | Freq: Once | ORAL | Status: AC
  Administered 2020-12-03: 296 mL via ORAL
  Filled 2020-12-03: qty 296

## 2020-12-03 MED ORDER — CEFAZOLIN SODIUM-DEXTROSE 2-4 GM/100ML-% IV SOLN: 2.0000 g | INTRAVENOUS | Status: DC

## 2020-12-03 MED ORDER — DORZOLAMIDE HCL-TIMOLOL MAL PF 22.3-6.8 MG/ML OP SOLN: 1.0000 [drp] | Freq: Two times a day (BID) | OPHTHALMIC | Status: DC

## 2020-12-03 MED ORDER — LABETALOL HCL 5 MG/ML IV SOLN: 10.0000 mg | INTRAVENOUS | Status: DC | PRN

## 2020-12-03 MED ORDER — BRIMONIDINE TARTRATE 0.15 % OP SOLN
1.0000 [drp] | Freq: Two times a day (BID) | OPHTHALMIC | Status: DC
  Administered 2020-12-04 – 2020-12-10 (×12): 1 [drp] via OPHTHALMIC
  Filled 2020-12-03: qty 5

## 2020-12-03 MED ORDER — DORZOLAMIDE HCL-TIMOLOL MAL 2-0.5 % OP SOLN
1.0000 [drp] | Freq: Two times a day (BID) | OPHTHALMIC | Status: DC
  Administered 2020-12-04 – 2020-12-10 (×12): 1 [drp] via OPHTHALMIC
  Filled 2020-12-03: qty 10

## 2020-12-03 MED ORDER — HEPARIN SODIUM (PORCINE) 5000 UNIT/ML IJ SOLN
5000.0000 [IU] | Freq: Three times a day (TID) | INTRAMUSCULAR | Status: AC
  Administered 2020-12-03 (×2): 5000 [IU] via SUBCUTANEOUS
  Filled 2020-12-03 (×2): qty 1

## 2020-12-03 NOTE — TOC CAGE-AID Note (Signed)
Transition of Care [facility]) - CAGE-AID Screening   Patient Details  Name: Jennifer Bailey MRN: [id number] Date of Birth: 03-15-1935   Clinical Narrative:  Patient denies any alcohol and drug use.  CAGE-AID Screening:    Have You Ever Felt You Ought to Cut Down on Your Drinking or Drug Use?: No Have People Annoyed You By Critizing Your Drinking Or Drug Use?: No Have You Felt Bad Or Guilty About Your Drinking Or Drug Use?: No Have You Ever Had a Drink or Used Drugs First Thing In The Morning to Steady Your Nerves or to Get Rid of a Hangover?: No CAGE-AID Score: 0  Substance Abuse Education Offered: No (Patient denies alcohol and drug use.)

## 2020-12-03 NOTE — Progress Notes (Addendum)
Inpatient Diabetes Program Recommendations  AACE/ADA: New Consensus Statement on Inpatient Glycemic Control (2015)  Target Ranges:  Prepandial:   less than 140 mg/dL      Peak postprandial:   less than 180 mg/dL (1-2 hours)      Critically ill patients:  140 - 180 mg/dL   Lab Results  Component Value Date   GLUCAP 327 (H) 12/03/2020   HGBA1C 7.3 (H) 12/02/2020    Review of Glycemic Control Results for ARYIA, DELIRA (MRN [id number]) as of 12/03/2020 14:05  Ref. Range 12/02/2020 14:11 12/02/2020 16:37 12/02/2020 20:26 12/03/2020 06:35 12/03/2020 11:23  Glucose-Capillary Latest Ref Range: 70 - 99 mg/dL 102 (H) 100 (H) 180 (H) 379 (H) Novolog 9 units 327 (H) Novolog 7 units   Diabetes history: DM2 Outpatient Diabetes medications: Tresiba 11 units qd + Novolog 3-5 units tid meal coverage Current orders for Inpatient glycemic control: Lantus 5 units + Novolog 0-9 units tid with meals  Will follow during hospitalization.  Thank you, Nani Gasser. Jaleigha Deane, RN, MSN, CDE  Diabetes Coordinator Inpatient Glycemic Control Team Team Pager [phone] (8am-5pm) 12/03/2020 2:07 PM

## 2020-12-03 NOTE — Anesthesia Preprocedure Evaluation (Addendum)
Anesthesia Evaluation  Patient identified by MRN, date of birth, ID band Patient awake    Reviewed: Allergy & Precautions, H&P , NPO status , Patient's Chart, lab work & pertinent test results  Airway Mallampati: II  TM Distance: >3 FB Neck ROM: Full    Dental   Pulmonary neg pulmonary ROS,    breath sounds clear to auscultation       Cardiovascular hypertension,  Rhythm:Regular Rate:Normal  Sinus rhythm   Neuro/Psych  Neuromuscular disease (diabetic polyneuropathy) negative psych ROS   GI/Hepatic negative GI ROS, Neg liver ROS,   Endo/Other  diabetes, Well Controlled, Type 2, Insulin DependentHypothyroidism   Renal/GU negative Renal ROS  negative genitourinary   Musculoskeletal negative musculoskeletal ROS (+)   Abdominal   Peds negative pediatric ROS (+)  Hematology negative hematology ROS (+) anemia ,   Anesthesia Other Findings   Reproductive/Obstetrics negative OB ROS                           Anesthesia Physical Anesthesia Plan  ASA: III  Anesthesia Plan: General   Post-op Pain Management:    Induction: Intravenous  PONV Risk Score and Plan: Ondansetron and Dexamethasone  Airway Management Planned: Oral ETT  Additional Equipment:   Intra-op Plan:   Post-operative Plan:   Informed Consent: I have reviewed the patients History and Physical, chart, labs and discussed the procedure including the risks, benefits and alternatives for the proposed anesthesia with the patient or authorized representative who has indicated his/her understanding and acceptance.       Plan Discussed with: CRNA and Anesthesiologist  Anesthesia Plan Comments:         Anesthesia Quick Evaluation

## 2020-12-03 NOTE — Progress Notes (Signed)
Inpatient Diabetes Program Recommendations  AACE/ADA: New Consensus Statement on Inpatient Glycemic Control (2015)  Target Ranges:  Prepandial:   less than 140 mg/dL      Peak postprandial:   less than 180 mg/dL (1-2 hours)      Critically ill patients:  140 - 180 mg/dL   Lab Results  Component Value Date   GLUCAP 327 (H) 12/03/2020   HGBA1C 7.3 (H) 12/02/2020    Review of Glycemic Control  Diabetes history: Type 2 Dm Outpatient Diabetes medications: Novolog 3-5 units TID, Tresiba 11 units QD Current orders for Inpatient glycemic control: Novolog 0-9 units TID, Lantus 5 units QD  Inpatient Diabetes Program Recommendations:    Consider: -Adding one time dose of Lantus 5 units now -Novolog 0-5 units QHS -Increase QD dose of Lantus to 10 units QD (patient's home dose).    Thanks, Bronson Curb, MSN, RNC-OB Diabetes Coordinator [phone] (8a-5p)

## 2020-12-03 NOTE — Progress Notes (Signed)
PROGRESS NOTE    Jennifer Bailey   YQI:[id number]  DOB: 18-Sep-1935  DOA: 12/02/2020     1  PCP: System, Provider Not In  CC: fall at home  Hospital Course: Jennifer Bailey is an 86 yo female with PMH DMII, neuropathy, HTN, HLD, osteoporosis, hypothyroidism who presented to the hospital after a mechanical fall at home, tripping down 3 stairs on her deck.  She struck her right knee and leg on the ground.  No reports of head injury or loss of consciousness. On evaluation with x-ray of right lower extremity she was found to have a proximal tibial fracture and fibular neck fracture.    Interval History:  Seen this morning with daughter bedside.  Patient planning for undergoing surgical repair while hospitalized.  She was otherwise feeling well when seen denying much pain in her right lower extremity.  ROS: Constitutional: negative for chills, fatigue and fevers, Respiratory: negative for cough, Cardiovascular: negative for chest pain and Gastrointestinal: negative for abdominal pain  Assessment & Plan:  Right tibial fracture Right fibular neck fracture - s/p mechanical fall at home - continue pain control - seen by ortho; tentative plan is for surgery on 3/2 - PT eval after surgery  HTN - continue current regimen  DMII - SSI and CBG monitoring - continue lantus   HLD - statin on hold for now  Glaucoma - continue home drops  Old records reviewed in assessment of this patient  Antimicrobials: n/a  DVT prophylaxis: HSQ; hold prior to surgery; will need to be resumed on ppx postop   Code Status:   Code Status: Full Code Family Communication: daughter bedside   Disposition Plan: Status is: Inpatient  Remains inpatient appropriate because:Ongoing diagnostic testing needed not appropriate for outpatient work up, IV treatments appropriate due to intensity of illness or inability to take PO and Inpatient level of care appropriate due to severity of illness   Dispo: The  patient is from: Home              Anticipated d/c is to: pending PT eval after surgery              Patient currently is not medically stable to d/c.   Difficult to place patient No  Risk of unplanned readmission score: Unplanned Admission- Pilot do not use: 13.04   Objective: Blood pressure (!) 122/48, pulse 86, temperature 98.8 F (37.1 C), temperature source Oral, resp. rate 17, height 4\' 10"  (1.473 m), weight 58.1 kg, SpO2 92 %.  Examination: General appearance: alert, cooperative and no distress Head: Normocephalic, without obvious abnormality, atraumatic Eyes: EOMI Lungs: clear to auscultation bilaterally Heart: regular rate and rhythm and S1, S2 normal Abdomen: normal findings: bowel sounds normal and soft, non-tender Extremities: no edema Skin: mobility and turgor normal Neurologic: Grossly normal  Consultants:   Ortho  Procedures:     Data Reviewed: I have personally reviewed following labs and imaging studies Results for orders placed or performed during the hospital encounter of 12/02/20 (from the past 24 hour(s))  Glucose, capillary     Status: Abnormal   Collection Time: 12/02/20  4:37 PM  Result Value Ref Range   Glucose-Capillary 100 (H) 70 - 99 mg/dL  Surgical pcr screen     Status: None   Collection Time: 12/02/20  8:10 PM   Specimen: Nasal Mucosa; Nasal Swab  Result Value Ref Range   MRSA, PCR NEGATIVE NEGATIVE   Staphylococcus aureus NEGATIVE NEGATIVE  Glucose, capillary  Status: Abnormal   Collection Time: 12/02/20  8:26 PM  Result Value Ref Range   Glucose-Capillary 180 (H) 70 - 99 mg/dL  Basic metabolic panel     Status: Abnormal   Collection Time: 12/03/20  2:12 AM  Result Value Ref Range   Sodium 133 (L) 135 - 145 mmol/L   Potassium 4.0 3.5 - 5.1 mmol/L   Chloride 103 98 - 111 mmol/L   CO2 23 22 - 32 mmol/L   Glucose, Bld 298 (H) 70 - 99 mg/dL   BUN 19 8 - 23 mg/dL   Creatinine, Ser 0.83 0.44 - 1.00 mg/dL   Calcium 8.4 (L) 8.9 - 10.3  mg/dL   GFR, Estimated >60 >60 mL/min   Anion gap 7 5 - 15  CBC     Status: Abnormal   Collection Time: 12/03/20  2:12 AM  Result Value Ref Range   WBC 8.5 4.0 - 10.5 K/uL   RBC 2.73 (L) 3.87 - 5.11 MIL/uL   Hemoglobin 8.4 (L) 12.0 - 15.0 g/dL   HCT 24.1 (L) 36.0 - 46.0 %   MCV 88.3 80.0 - 100.0 fL   MCH 30.8 26.0 - 34.0 pg   MCHC 34.9 30.0 - 36.0 g/dL   RDW 14.0 11.5 - 15.5 %   Platelets 219 150 - 400 K/uL   nRBC 0.0 0.0 - 0.2 %  ABO/Rh     Status: None   Collection Time: 12/03/20  2:12 AM  Result Value Ref Range   ABO/RH(D)      O POS Performed at [facility], [address], [place], [place] [address]   Glucose, capillary     Status: Abnormal   Collection Time: 12/03/20  6:35 AM  Result Value Ref Range   Glucose-Capillary 379 (H) 70 - 99 mg/dL  Type and screen [place]     Status: None   Collection Time: 12/03/20  9:09 AM  Result Value Ref Range   ABO/RH(D) O POS    Antibody Screen NEG    Sample Expiration      12/06/2020,2359 Performed at [facility], [place] [address], [place], [place] [address]   Glucose, capillary     Status: Abnormal   Collection Time: 12/03/20 11:23 AM  Result Value Ref Range   Glucose-Capillary 327 (H) 70 - 99 mg/dL  Glucose, capillary     Status: None   Collection Time: 12/03/20  2:24 PM  Result Value Ref Range   Glucose-Capillary 88 70 - 99 mg/dL    Recent Results (from the past 240 hour(s))  Resp Panel by RT-PCR (Flu A&B, Covid) Nasopharyngeal Swab     Status: None   Collection Time: 12/02/20 12:17 PM   Specimen: Nasopharyngeal Swab; Nasopharyngeal(NP) swabs in vial transport medium  Result Value Ref Range Status   SARS Coronavirus 2 by RT PCR NEGATIVE NEGATIVE Final    Comment: (NOTE) SARS-CoV-2 target nucleic acids are NOT DETECTED.  The SARS-CoV-2 RNA is generally detectable in upper respiratory specimens during the acute phase of infection. The lowest concentration of SARS-CoV-2 viral copies this  assay can detect is 138 copies/mL. A negative result does not preclude SARS-Cov-2 infection and should not be used as the sole basis for treatment or other patient management decisions. A negative result may occur with  improper specimen collection/handling, submission of specimen other than nasopharyngeal swab, presence of viral mutation(s) within the areas targeted by this assay, and inadequate number of viral copies(<138 copies/mL). A  negative result must be combined with clinical observations, patient history, and epidemiological information. The expected result is Negative.  Fact Sheet for Patients:  EntrepreneurPulse.com.au  Fact Sheet for Healthcare Providers:  IncredibleEmployment.be  This test is no t yet approved or cleared by the [place] FDA and  has been authorized for detection and/or diagnosis of SARS-CoV-2 by FDA under an Emergency Use Authorization (EUA). This EUA will remain  in effect (meaning this test can be used) for the duration of the COVID-19 declaration under Section 564(b)(1) of the Act, 21 U.S.C.section 360bbb-3(b)(1), unless the authorization is terminated  or revoked sooner.       Influenza A by PCR NEGATIVE NEGATIVE Final   Influenza B by PCR NEGATIVE NEGATIVE Final    Comment: (NOTE) The Xpert Xpress SARS-CoV-2/FLU/RSV plus assay is intended as an aid in the diagnosis of influenza from Nasopharyngeal swab specimens and should not be used as a sole basis for treatment. Nasal washings and aspirates are unacceptable for Xpert Xpress SARS-CoV-2/FLU/RSV testing.  Fact Sheet for Patients: EntrepreneurPulse.com.au  Fact Sheet for Healthcare Providers: IncredibleEmployment.be  This test is not yet approved or cleared by the [place] FDA and has been authorized for detection and/or diagnosis of SARS-CoV-2 by FDA under an Emergency Use Authorization (EUA). This EUA will  remain in effect (meaning this test can be used) for the duration of the COVID-19 declaration under Section 564(b)(1) of the Act, 21 U.S.C. section 360bbb-3(b)(1), unless the authorization is terminated or revoked.  Performed at [facility], [place] [address], [place], [place] [address]   Surgical pcr screen     Status: None   Collection Time: 12/02/20  8:10 PM   Specimen: Nasal Mucosa; Nasal Swab  Result Value Ref Range Status   MRSA, PCR NEGATIVE NEGATIVE Final   Staphylococcus aureus NEGATIVE NEGATIVE Final    Comment: (NOTE) The Xpert SA Assay (FDA approved for NASAL specimens in patients 50 years of age and older), is one component of a comprehensive surveillance program. It is not intended to diagnose infection nor to guide or monitor treatment. Performed at [facility], [place] [address], [place], [place] [address]      Radiology Studies: DG Tibia/Fibula Right  Result Date: 12/02/2020 CLINICAL DATA:  Fall with right leg pain EXAM: RIGHT KNEE - COMPLETE 4+ VIEW; RIGHT TIBIA AND FIBULA - 2 VIEW; RIGHT ANKLE - COMPLETE 3+ VIEW COMPARISON:  None. FINDINGS: Acute transversely oriented fracture through the proximal tibial metaphysis with slight anterior apex angulation. No evidence of fracture extension to the articular surface of the tibiofemoral joint. Comminuted fracture of the fibular neck with minimal medial displacement. Trace knee joint effusion without evidence of lipohemarthrosis. Prominent soft tissue swelling overlying the proximal tibial and fibular fracture sites. Ankle mortise is congruent without evidence of fracture or malalignment. No soft tissue swelling at the ankle. IMPRESSION: 1. Acute transversely oriented fracture through the proximal tibial metaphysis with slight anterior apex angulation. 2. Comminuted fracture of the fibular neck with minimal medial displacement. 3. Right ankle is intact without fracture or malalignment. Electronically Signed    By: Davina Poke D.O.   On: 12/02/2020 11:42   DG Ankle Complete Right  Result Date: 12/02/2020 CLINICAL DATA:  Fall with right leg pain EXAM: RIGHT KNEE - COMPLETE 4+ VIEW; RIGHT TIBIA AND FIBULA - 2 VIEW; RIGHT ANKLE - COMPLETE 3+ VIEW COMPARISON:  None. FINDINGS: Acute transversely oriented fracture through the proximal tibial metaphysis with slight anterior apex angulation. No  evidence of fracture extension to the articular surface of the tibiofemoral joint. Comminuted fracture of the fibular neck with minimal medial displacement. Trace knee joint effusion without evidence of lipohemarthrosis. Prominent soft tissue swelling overlying the proximal tibial and fibular fracture sites. Ankle mortise is congruent without evidence of fracture or malalignment. No soft tissue swelling at the ankle. IMPRESSION: 1. Acute transversely oriented fracture through the proximal tibial metaphysis with slight anterior apex angulation. 2. Comminuted fracture of the fibular neck with minimal medial displacement. 3. Right ankle is intact without fracture or malalignment. Electronically Signed   By: Davina Poke D.O.   On: 12/02/2020 11:42   CT Knee Right Wo Contrast  Result Date: 12/02/2020 CLINICAL DATA:  Fall down stairs 3 days ago.  Tibial fracture. EXAM: CT OF THE RIGHT KNEE WITHOUT CONTRAST TECHNIQUE: Multidetector CT imaging of the right knee was performed according to the standard protocol. Multiplanar CT image reconstructions were also generated. COMPARISON:  Radiographs 12/02/2020 FINDINGS: Bones/Joint/Cartilage Transverse, minimally comminuted fracture through the proximal tibial metaphysis demonstrates apex anterior angulation with 14 mm of fracture distraction anteriorly. There is up to 13 mm of posterior displacement of the distal fragment. This fracture extends into the tibiofibular articulation, and likely extends anteriorly toward the articular surface of the proximal tibia. There is no definite  involvement or displacement of the weight-bearing articular surface. There is a mildly displaced fracture of the fibular head. The distal femur and patella are intact. There is no joint effusion or lipohemarthrosis. Ligaments Suboptimally assessed by CT.  The cruciate ligaments appear intact. Muscles and Tendons The extensor mechanism is intact. The patellar tendon insertion on the tibial tubercle is just proximal to the fracture. Soft tissues Prepatellar soft tissue swelling. No foreign body or soft tissue emphysema. IMPRESSION: 1. Transverse, minimally comminuted fracture through the proximal tibial metaphysis as described. This fracture is mildly displaced and is situated just distal to insertion of the patellar tendon on the tibial tubercle. There is probable proximal extension of a nondisplaced component anteriorly towards the articular surface. 2. Mildly displaced fracture of the fibular head. 3. Intact femur. Electronically Signed   By: Richardean Sale M.D.   On: 12/02/2020 13:49   DG Chest Portable 1 View  Result Date: 12/02/2020 CLINICAL DATA:  Preop evaluation for upcoming leg surgery EXAM: PORTABLE CHEST 1 VIEW COMPARISON:  12/29/2010 FINDINGS: Cardiac shadows within normal limits. Mild aortic calcifications are seen. Lungs are well aerated bilaterally. Mild scarring in the left base is again seen. No acute bony abnormality is noted. IMPRESSION: Mild scarring in the left base.  No acute abnormality noted. Electronically Signed   By: Inez Catalina M.D.   On: 12/02/2020 12:43   DG Knee Complete 4 Views Right  Result Date: 12/02/2020 CLINICAL DATA:  Fall with right leg pain EXAM: RIGHT KNEE - COMPLETE 4+ VIEW; RIGHT TIBIA AND FIBULA - 2 VIEW; RIGHT ANKLE - COMPLETE 3+ VIEW COMPARISON:  None. FINDINGS: Acute transversely oriented fracture through the proximal tibial metaphysis with slight anterior apex angulation. No evidence of fracture extension to the articular surface of the tibiofemoral joint.  Comminuted fracture of the fibular neck with minimal medial displacement. Trace knee joint effusion without evidence of lipohemarthrosis. Prominent soft tissue swelling overlying the proximal tibial and fibular fracture sites. Ankle mortise is congruent without evidence of fracture or malalignment. No soft tissue swelling at the ankle. IMPRESSION: 1. Acute transversely oriented fracture through the proximal tibial metaphysis with slight anterior apex angulation. 2. Comminuted fracture of the  fibular neck with minimal medial displacement. 3. Right ankle is intact without fracture or malalignment. Electronically Signed   By: Davina Poke D.O.   On: 12/02/2020 11:42   CT Knee Right Wo Contrast  Final Result    DG Chest Portable 1 View  Final Result    DG Knee Complete 4 Views Right  Final Result    DG Ankle Complete Right  Final Result    DG Tibia/Fibula Right  Final Result      Scheduled Meds: . brimonidine  1 drop Left Eye BID  . cycloSPORINE  1 drop Both Eyes BID  . dorzolamide-timolol  1 drop Left Eye BID  . insulin aspart  0-9 Units Subcutaneous TID WC  . insulin glargine  5 Units Subcutaneous Daily  . povidone-iodine  2 application Topical Once  . sodium chloride flush  3 mL Intravenous Q12H   PRN Meds: acetaminophen, HYDROcodone-acetaminophen, metoprolol tartrate, ondansetron (ZOFRAN) IV, polyethylene glycol, polyvinyl alcohol Continuous Infusions: . vancomycin       LOS: 1 day  Time spent: Greater than 50% of the 35 minute visit was spent in counseling/coordination of care for the patient as laid out in the A&P.   Dwyane Dee, MD [facility] 12/03/2020, 3:47 PM

## 2020-12-04 ENCOUNTER — Inpatient Hospital Stay (HOSPITAL_COMMUNITY): Payer: HMO | Admitting: Anesthesiology

## 2020-12-04 ENCOUNTER — Inpatient Hospital Stay (HOSPITAL_COMMUNITY): Payer: HMO

## 2020-12-04 ENCOUNTER — Encounter (HOSPITAL_COMMUNITY)
Admission: EM | Disposition: A | Discharge status: Skilled Nursing Facility | Payer: Self-pay | Source: Home / Self Care | Attending: Internal Medicine

## 2020-12-04 DIAGNOSIS — H409 Unspecified glaucoma: Secondary | ICD-10-CM

## 2020-12-04 DIAGNOSIS — S82401A Unspecified fracture of shaft of right fibula, initial encounter for closed fracture: Secondary | ICD-10-CM | POA: Diagnosis not present

## 2020-12-04 DIAGNOSIS — S82201A Unspecified fracture of shaft of right tibia, initial encounter for closed fracture: Secondary | ICD-10-CM | POA: Diagnosis not present

## 2020-12-04 HISTORY — PX: ORIF TIBIA PLATEAU: SHX2132

## 2020-12-04 LAB — BASIC METABOLIC PANEL
Anion gap: 8 (ref 5–15)
BUN: 16 mg/dL (ref 8–23)
CO2: 23 mmol/L (ref 22–32)
Calcium: 8.4 mg/dL — ABNORMAL LOW (ref 8.9–10.3)
Chloride: 105 mmol/L (ref 98–111)
Creatinine, Ser: 0.86 mg/dL (ref 0.44–1.00)
GFR, Estimated: >60 mL/min (ref >60)
Glucose, Bld: 307 mg/dL — ABNORMAL HIGH (ref 70–99)
Potassium: 4.8 mmol/L (ref 3.5–5.1)
Sodium: 136 mmol/L (ref 135–145)

## 2020-12-04 LAB — GLUCOSE, CAPILLARY
Glucose-Capillary: 311 mg/dL — ABNORMAL HIGH (ref 70–99)
Glucose-Capillary: 316 mg/dL — ABNORMAL HIGH (ref 70–99)
Glucose-Capillary: 202 mg/dL — ABNORMAL HIGH (ref 70–99)
Glucose-Capillary: 158 mg/dL — ABNORMAL HIGH (ref 70–99)
Glucose-Capillary: 131 mg/dL — ABNORMAL HIGH (ref 70–99)
Glucose-Capillary: 130 mg/dL — ABNORMAL HIGH (ref 70–99)
Glucose-Capillary: 308 mg/dL — ABNORMAL HIGH (ref 70–99)
Glucose-Capillary: 218 mg/dL — ABNORMAL HIGH (ref 70–99)

## 2020-12-04 LAB — CBC
HCT: 27.2 % — ABNORMAL LOW (ref 36.0–46.0)
Hemoglobin: 8.8 g/dL — ABNORMAL LOW (ref 12.0–15.0)
MCH: 29.6 pg (ref 26.0–34.0)
MCHC: 32.4 g/dL (ref 30.0–36.0)
MCV: 91.6 fL (ref 80.0–100.0)
Platelets: 153 10*3/uL (ref 150–400)
RBC: 2.97 MIL/uL — ABNORMAL LOW (ref 3.87–5.11)
RDW: 14 % (ref 11.5–15.5)
WBC: 9.8 10*3/uL (ref 4.0–10.5)
nRBC: 0 % (ref 0.0–0.2)

## 2020-12-04 LAB — MAGNESIUM: Magnesium: 1.9 mg/dL (ref 1.7–2.4)

## 2020-12-04 SURGERY — OPEN REDUCTION INTERNAL FIXATION (ORIF) TIBIAL PLATEAU
Anesthesia: General | Site: Leg Lower | Laterality: Right

## 2020-12-04 MED ORDER — SUGAMMADEX SODIUM 200 MG/2ML IV SOLN
INTRAVENOUS | Status: DC | PRN
  Administered 2020-12-04 (×2): 100 mg via INTRAVENOUS

## 2020-12-04 MED ORDER — ACETAMINOPHEN 10 MG/ML IV SOLN
INTRAVENOUS | Status: DC | PRN
  Administered 2020-12-04: 1000 mg via INTRAVENOUS

## 2020-12-04 MED ORDER — VANCOMYCIN HCL 1000 MG IV SOLR
INTRAVENOUS | Status: DC | PRN
  Administered 2020-12-04: 1000 mg via TOPICAL

## 2020-12-04 MED ORDER — PROPOFOL 10 MG/ML IV BOLUS
INTRAVENOUS | Status: DC | PRN
  Administered 2020-12-04: 120 mg via INTRAVENOUS

## 2020-12-04 MED ORDER — GABAPENTIN 300 MG PO CAPS
300.0000 mg | ORAL_CAPSULE | Freq: Every day | ORAL | Status: DC
  Administered 2020-12-04 – 2020-12-09 (×6): 300 mg via ORAL
  Filled 2020-12-04 (×6): qty 1

## 2020-12-04 MED ORDER — LEVOTHYROXINE SODIUM 75 MCG PO TABS
75.0000 ug | ORAL_TABLET | Freq: Every day | ORAL | Status: DC
  Administered 2020-12-05 – 2020-12-10 (×6): 75 ug via ORAL
  Filled 2020-12-04 (×6): qty 1

## 2020-12-04 MED ORDER — VANCOMYCIN HCL 1000 MG IV SOLR
INTRAVENOUS | Status: AC
  Filled 2020-12-04: qty 1000

## 2020-12-04 MED ORDER — OXYCODONE HCL 5 MG PO TABS
5.0000 mg | ORAL_TABLET | Freq: Four times a day (QID) | ORAL | Status: DC | PRN
  Administered 2020-12-04 – 2020-12-05 (×2): 5 mg via ORAL
  Filled 2020-12-04 (×3): qty 1

## 2020-12-04 MED ORDER — FENTANYL CITRATE (PF) 100 MCG/2ML IJ SOLN
25.0000 ug | INTRAMUSCULAR | Status: DC | PRN
  Administered 2020-12-04: 25 ug via INTRAVENOUS

## 2020-12-04 MED ORDER — OXYCODONE HCL 5 MG/5ML PO SOLN: 5.0000 mg | Freq: Once | ORAL | Status: DC | PRN

## 2020-12-04 MED ORDER — CLINDAMYCIN PHOSPHATE 900 MG/50ML IV SOLN
INTRAVENOUS | Status: AC
  Filled 2020-12-04: qty 50

## 2020-12-04 MED ORDER — ENOXAPARIN SODIUM 40 MG/0.4ML ~~LOC~~ SOLN: 40.0000 mg | SUBCUTANEOUS | Status: DC

## 2020-12-04 MED ORDER — PHENYLEPHRINE 40 MCG/ML (10ML) SYRINGE FOR IV PUSH (FOR BLOOD PRESSURE SUPPORT)
PREFILLED_SYRINGE | INTRAVENOUS | Status: DC | PRN
  Administered 2020-12-04: 120 ug via INTRAVENOUS

## 2020-12-04 MED ORDER — ACETAMINOPHEN 10 MG/ML IV SOLN
INTRAVENOUS | Status: AC
  Filled 2020-12-04: qty 100

## 2020-12-04 MED ORDER — LIDOCAINE 2% (20 MG/ML) 5 ML SYRINGE
INTRAMUSCULAR | Status: DC | PRN
  Administered 2020-12-04: 40 mg via INTRAVENOUS

## 2020-12-04 MED ORDER — INSULIN REGULAR(HUMAN) IN NACL 100-0.9 UT/100ML-% IV SOLN
INTRAVENOUS | Status: DC | PRN
  Administered 2020-12-04: 10 [IU]/h via INTRAVENOUS

## 2020-12-04 MED ORDER — FENTANYL CITRATE (PF) 250 MCG/5ML IJ SOLN
INTRAMUSCULAR | Status: AC
  Filled 2020-12-04: qty 5

## 2020-12-04 MED ORDER — PHENYLEPHRINE 40 MCG/ML (10ML) SYRINGE FOR IV PUSH (FOR BLOOD PRESSURE SUPPORT)
PREFILLED_SYRINGE | INTRAVENOUS | Status: AC
  Filled 2020-12-04: qty 10

## 2020-12-04 MED ORDER — LACTATED RINGERS IV SOLN: INTRAVENOUS | Status: DC | PRN

## 2020-12-04 MED ORDER — FENTANYL CITRATE (PF) 100 MCG/2ML IJ SOLN
INTRAMUSCULAR | Status: AC
  Administered 2020-12-04: 25 ug via INTRAVENOUS
  Filled 2020-12-04: qty 2

## 2020-12-04 MED ORDER — ROCURONIUM BROMIDE 10 MG/ML (PF) SYRINGE
PREFILLED_SYRINGE | INTRAVENOUS | Status: DC | PRN
  Administered 2020-12-04: 50 mg via INTRAVENOUS

## 2020-12-04 MED ORDER — MORPHINE SULFATE (PF) 2 MG/ML IV SOLN: 0.5000 mg | INTRAVENOUS | Status: DC | PRN

## 2020-12-04 MED ORDER — FENTANYL CITRATE (PF) 250 MCG/5ML IJ SOLN
INTRAMUSCULAR | Status: DC | PRN
  Administered 2020-12-04: 50 ug via INTRAVENOUS

## 2020-12-04 MED ORDER — ONDANSETRON HCL 4 MG/2ML IJ SOLN
INTRAMUSCULAR | Status: AC
  Filled 2020-12-04: qty 2

## 2020-12-04 MED ORDER — ONDANSETRON HCL 4 MG/2ML IJ SOLN: 4.0000 mg | Freq: Once | INTRAMUSCULAR | Status: DC | PRN

## 2020-12-04 MED ORDER — PHENYLEPHRINE HCL-NACL 10-0.9 MG/250ML-% IV SOLN
INTRAVENOUS | Status: DC | PRN
  Administered 2020-12-04: 30 ug/min via INTRAVENOUS

## 2020-12-04 MED ORDER — LIDOCAINE 2% (20 MG/ML) 5 ML SYRINGE
INTRAMUSCULAR | Status: AC
  Filled 2020-12-04: qty 5

## 2020-12-04 MED ORDER — PROPOFOL 10 MG/ML IV BOLUS
INTRAVENOUS | Status: AC
  Filled 2020-12-04: qty 20

## 2020-12-04 MED ORDER — ROCURONIUM BROMIDE 10 MG/ML (PF) SYRINGE
PREFILLED_SYRINGE | INTRAVENOUS | Status: AC
  Filled 2020-12-04: qty 10

## 2020-12-04 MED ORDER — OXYCODONE HCL 5 MG PO TABS: 5.0000 mg | ORAL_TABLET | Freq: Once | ORAL | Status: DC | PRN

## 2020-12-04 MED ORDER — CLINDAMYCIN PHOSPHATE 900 MG/50ML IV SOLN
900.0000 mg | Freq: Three times a day (TID) | INTRAVENOUS | Status: AC
  Administered 2020-12-04 – 2020-12-05 (×3): 900 mg via INTRAVENOUS
  Filled 2020-12-04 (×3): qty 50

## 2020-12-04 MED ORDER — ONDANSETRON HCL 4 MG/2ML IJ SOLN
INTRAMUSCULAR | Status: DC | PRN
  Administered 2020-12-04: 4 mg via INTRAVENOUS

## 2020-12-04 MED ORDER — CLINDAMYCIN PHOSPHATE 900 MG/50ML IV SOLN
INTRAVENOUS | Status: DC | PRN
  Administered 2020-12-04: 900 mg via INTRAVENOUS

## 2020-12-04 SURGICAL SUPPLY — 85 items
BANDAGE ESMARK 6X9 LF (GAUZE/BANDAGES/DRESSINGS) ×1 IMPLANT
BIT DRILL CALIBR QC 2.5X250 (BIT) ×1 IMPLANT
BIT DRILL PERC QC 2.8X200 100 (BIT) IMPLANT
BLADE CLIPPER SURG (BLADE) IMPLANT
BLADE SURG 10 STRL SS (BLADE) ×2 IMPLANT
BLADE SURG 15 STRL LF DISP TIS (BLADE) ×1 IMPLANT
BLADE SURG 15 STRL SS (BLADE) ×2
BNDG COHESIVE 4X5 TAN STRL (GAUZE/BANDAGES/DRESSINGS) ×2 IMPLANT
BNDG ELASTIC 4X5.8 VLCR STR LF (GAUZE/BANDAGES/DRESSINGS) ×2 IMPLANT
BNDG ELASTIC 6X5.8 VLCR STR LF (GAUZE/BANDAGES/DRESSINGS) ×2 IMPLANT
BNDG ESMARK 6X9 LF (GAUZE/BANDAGES/DRESSINGS) ×2
BNDG GAUZE ELAST 4 BULKY (GAUZE/BANDAGES/DRESSINGS) ×2 IMPLANT
BRUSH SCRUB EZ PLAIN DRY (MISCELLANEOUS) ×2 IMPLANT
CANISTER SUCT 3000ML PPV (MISCELLANEOUS) ×2 IMPLANT
COVER SURGICAL LIGHT HANDLE (MISCELLANEOUS) ×2 IMPLANT
COVER WAND RF STERILE (DRAPES) ×2 IMPLANT
CUFF TOURN SGL QUICK 34 (TOURNIQUET CUFF)
CUFF TRNQT CYL 34X4.125X (TOURNIQUET CUFF) ×1 IMPLANT
DRAPE C-ARM 42X72 X-RAY (DRAPES) ×2 IMPLANT
DRAPE C-ARMOR (DRAPES) ×2 IMPLANT
DRAPE HALF SHEET 40X57 (DRAPES) IMPLANT
DRAPE INCISE IOBAN 66X45 STRL (DRAPES) ×2 IMPLANT
DRAPE U-SHAPE 47X51 STRL (DRAPES) ×2 IMPLANT
DRILL BIT QUICK COUP 2.8MM 100 (BIT) ×1
DRSG ADAPTIC 3X8 NADH LF (GAUZE/BANDAGES/DRESSINGS) ×1 IMPLANT
DRSG PAD ABDOMINAL 8X10 ST (GAUZE/BANDAGES/DRESSINGS) ×2 IMPLANT
ELECT REM PT RETURN 9FT ADLT (ELECTROSURGICAL) ×2
ELECTRODE REM PT RTRN 9FT ADLT (ELECTROSURGICAL) ×1 IMPLANT
GAUZE SPONGE 4X4 12PLY STRL (GAUZE/BANDAGES/DRESSINGS) ×2 IMPLANT
GLOVE BIO SURGEON STRL SZ7.5 (GLOVE) ×1 IMPLANT
GLOVE BIO SURGEON STRL SZ8 (GLOVE) ×1 IMPLANT
GLOVE BIOGEL PI IND STRL 7.5 (GLOVE) ×1 IMPLANT
GLOVE BIOGEL PI INDICATOR 7.5 (GLOVE)
GLOVE SRG 8 PF TXTR STRL LF DI (GLOVE) ×1 IMPLANT
GLOVE SURG PR MICRO ENCORE 7 (GLOVE) ×3 IMPLANT
GLOVE SURG SYN 6.5 ES PF (GLOVE) ×2 IMPLANT
GLOVE SURG SYN 6.5 PF PI (GLOVE) IMPLANT
GLOVE SURG UNDER POLY LF SZ8 (GLOVE) ×2
GOWN STRL REUS W/ TWL LRG LVL3 (GOWN DISPOSABLE) ×2 IMPLANT
GOWN STRL REUS W/ TWL XL LVL3 (GOWN DISPOSABLE) ×1 IMPLANT
GOWN STRL REUS W/TWL LRG LVL3 (GOWN DISPOSABLE) ×4
GOWN STRL REUS W/TWL XL LVL3 (GOWN DISPOSABLE) ×2
IMMOBILIZER KNEE 22 UNIV (SOFTGOODS) ×1 IMPLANT
K-WIRE 1.6X150 (WIRE) ×2
KIT BASIN OR (CUSTOM PROCEDURE TRAY) ×2 IMPLANT
KIT TURNOVER KIT B (KITS) ×2 IMPLANT
KWIRE 1.6X150 (WIRE) IMPLANT
NDL SUT 6 .5 CRC .975X.05 MAYO (NEEDLE) IMPLANT
NEEDLE MAYO TAPER (NEEDLE) ×2
NS IRRIG 1000ML POUR BTL (IV SOLUTION) ×2 IMPLANT
PACK ORTHO EXTREMITY (CUSTOM PROCEDURE TRAY) ×2 IMPLANT
PAD ARMBOARD 7.5X6 YLW CONV (MISCELLANEOUS) ×4 IMPLANT
PAD CAST 4YDX4 CTTN HI CHSV (CAST SUPPLIES) ×1 IMPLANT
PADDING CAST COTTON 4X4 STRL (CAST SUPPLIES) ×2
PADDING CAST COTTON 6X4 STRL (CAST SUPPLIES) ×2 IMPLANT
PLATE PROX TIBIA VA LCP 3.5MM (Plate) ×1 IMPLANT
SCREW CORTEX 3.5 30MM (Screw) ×2 IMPLANT
SCREW CORTEX 3.5 36MM (Screw) ×1 IMPLANT
SCREW CORTEX 3.5 38MM (Screw) ×1 IMPLANT
SCREW CORTEX 3.5 70MM SELF TAP (Screw) ×1 IMPLANT
SCREW LOCK CORT ST 3.5X30 (Screw) IMPLANT
SCREW LOCK CORT ST 3.5X36 (Screw) IMPLANT
SCREW LOCK CORT ST 3.5X38 (Screw) IMPLANT
SCREW LOCKING 3.5X70MM VA (Screw) ×1 IMPLANT
SCREW LOCKING VA 3.5X75MM (Screw) ×1 IMPLANT
SCREW VA-LOCKING 65MM 3.5 (Screw) ×3 IMPLANT
SPONGE LAP 18X18 RF (DISPOSABLE) ×2 IMPLANT
STAPLER VISISTAT 35W (STAPLE) ×2 IMPLANT
STOCKINETTE IMPERVIOUS LG (DRAPES) ×2 IMPLANT
SUCTION FRAZIER HANDLE 10FR (MISCELLANEOUS) ×2
SUCTION TUBE FRAZIER 10FR DISP (MISCELLANEOUS) ×1 IMPLANT
SUT ETHILON 3 0 PS 1 (SUTURE) IMPLANT
SUT PROLENE 0 CT 2 (SUTURE) ×4 IMPLANT
SUT VIC AB 0 CT1 27 (SUTURE) ×2
SUT VIC AB 0 CT1 27XBRD ANBCTR (SUTURE) ×1 IMPLANT
SUT VIC AB 1 CT1 27 (SUTURE) ×2
SUT VIC AB 1 CT1 27XBRD ANBCTR (SUTURE) ×1 IMPLANT
SUT VIC AB 2-0 CT1 27 (SUTURE) ×4
SUT VIC AB 2-0 CT1 TAPERPNT 27 (SUTURE) ×2 IMPLANT
TOWEL GREEN STERILE (TOWEL DISPOSABLE) ×4 IMPLANT
TOWEL GREEN STERILE FF (TOWEL DISPOSABLE) ×2 IMPLANT
TRAY FOLEY MTR SLVR 16FR STAT (SET/KITS/TRAYS/PACK) IMPLANT
TUBE CONNECTING 12X1/4 (SUCTIONS) ×2 IMPLANT
WATER STERILE IRR 1000ML POUR (IV SOLUTION) ×4 IMPLANT
YANKAUER SUCT BULB TIP NO VENT (SUCTIONS) ×2 IMPLANT

## 2020-12-04 NOTE — Assessment & Plan Note (Addendum)
-   s/p mechanical fall at home - continue pain control - s/p ORIF on 3/2 - per ortho: weightbearing for transfers only; no walker ambulation; will need to follow up with ortho before being cleared for further weight bearing status  - PT: SNF recommended  -Follow-up with orthopedic surgery 2 weeks after discharge for repeat x-rays and wound check - will need ongoing DVT ppx at discharge. Diona Fanti would be okay given initial period with anticoagulation postop but given limited WB status and low bleed risk, would also consider xarelto for ongoing DVT ppx at discharge (but needs Hgb monitoring still)

## 2020-12-04 NOTE — Assessment & Plan Note (Signed)
-   continue home eye drops

## 2020-12-04 NOTE — Progress Notes (Signed)
PROGRESS NOTE    Jennifer Bailey   EXH:[id number]  DOB: 17-Mar-1935  DOA: 12/02/2020     2  PCP: System, Provider Not In  CC: fall at home  Hospital Course: Ms. Laguardia is an 86 yo female with PMH DMII, neuropathy, HTN, HLD, osteoporosis, hypothyroidism who presented to the hospital after a mechanical fall at home, tripping down 3 stairs on her deck.  She struck her right knee and leg on the ground.  No reports of head injury or loss of consciousness. On evaluation with x-ray of right lower extremity she was found to have a proximal tibial fracture and fibular neck fracture.   She underwent ORIF on 12/04/20.    Interval History:  No events overnight. Seen in her room this afternoon after surgery. Family present bedside as well. Patient feeling okay but still waking up from anesthesia, but was comfortable.   ROS: Constitutional: negative for chills, fatigue and fevers, Respiratory: negative for cough, Cardiovascular: negative for chest pain and Gastrointestinal: negative for abdominal pain  Assessment & Plan:  * Tibia/fibula fracture - s/p mechanical fall at home - continue pain control - s/p ORIF on 3/2 - per ortho: weightbearing for transfers only; no walker ambulation - PT eval pending   Type II diabetes mellitus, uncontrolled (HCC) - continue lantus and SSI - continue CBG monitoring   Glaucoma - continue home eye drops  Hyperlipidemia associated with type 2 diabetes mellitus ([place]) - holding statin for now  Hypertension associated with diabetes ([place]) - continue current regimen    Old records reviewed in assessment of this patient  Antimicrobials: n/a  DVT prophylaxis: enoxaparin (LOVENOX) injection 40 mg Start: 12/05/20 0900   Code Status:   Code Status: Full Code Family Communication: daughter bedside   Disposition Plan: Status is: Inpatient  Remains inpatient appropriate because:Ongoing diagnostic testing needed not appropriate for outpatient work up, IV  treatments appropriate due to intensity of illness or inability to take PO and Inpatient level of care appropriate due to severity of illness   Dispo: The patient is from: Home              Anticipated d/c is to: pending PT eval after surgery              Patient currently is not medically stable to d/c.   Difficult to place patient No  Risk of unplanned readmission score: Unplanned Admission- Pilot do not use: 14.8   Objective: Blood pressure (!) 119/35, pulse 79, temperature 98.2 F (36.8 C), temperature source Oral, resp. rate 17, height 4\' 10"  (1.473 m), weight 58.1 kg, SpO2 97 %.  Examination: General appearance: alert, cooperative and no distress Head: Normocephalic, without obvious abnormality, atraumatic Eyes: EOMI Lungs: clear to auscultation bilaterally Heart: regular rate and rhythm and S1, S2 normal Abdomen: normal findings: bowel sounds normal and soft, non-tender Extremities: RLE wrapped in bandage postop; sensation intact and soft compartments Skin: mobility and turgor normal Neurologic: Grossly normal  Consultants:   Ortho  Procedures:     Data Reviewed: I have personally reviewed following labs and imaging studies Results for orders placed or performed during the hospital encounter of 12/02/20 (from the past 24 hour(s))  Glucose, capillary     Status: None   Collection Time: 12/03/20  4:32 PM  Result Value Ref Range   Glucose-Capillary 89 70 - 99 mg/dL  Glucose, capillary     Status: Abnormal   Collection Time: 12/03/20  8:49 PM  Result Value Ref Range  Glucose-Capillary 257 (H) 70 - 99 mg/dL  Basic metabolic panel     Status: Abnormal   Collection Time: 12/04/20  4:37 AM  Result Value Ref Range   Sodium 136 135 - 145 mmol/L   Potassium 4.8 3.5 - 5.1 mmol/L   Chloride 105 98 - 111 mmol/L   CO2 23 22 - 32 mmol/L   Glucose, Bld 307 (H) 70 - 99 mg/dL   BUN 16 8 - 23 mg/dL   Creatinine, Ser 0.86 0.44 - 1.00 mg/dL   Calcium 8.4 (L) 8.9 - 10.3 mg/dL    GFR, Estimated >60 >60 mL/min   Anion gap 8 5 - 15  CBC     Status: Abnormal   Collection Time: 12/04/20  4:37 AM  Result Value Ref Range   WBC 9.8 4.0 - 10.5 K/uL   RBC 2.97 (L) 3.87 - 5.11 MIL/uL   Hemoglobin 8.8 (L) 12.0 - 15.0 g/dL   HCT 27.2 (L) 36.0 - 46.0 %   MCV 91.6 80.0 - 100.0 fL   MCH 29.6 26.0 - 34.0 pg   MCHC 32.4 30.0 - 36.0 g/dL   RDW 14.0 11.5 - 15.5 %   Platelets 153 150 - 400 K/uL   nRBC 0.0 0.0 - 0.2 %  Magnesium     Status: None   Collection Time: 12/04/20  4:37 AM  Result Value Ref Range   Magnesium 1.9 1.7 - 2.4 mg/dL  Glucose, capillary     Status: Abnormal   Collection Time: 12/04/20  6:57 AM  Result Value Ref Range   Glucose-Capillary 311 (H) 70 - 99 mg/dL  Glucose, capillary     Status: Abnormal   Collection Time: 12/04/20  8:00 AM  Result Value Ref Range   Glucose-Capillary 316 (H) 70 - 99 mg/dL   Comment 1 Notify RN   Glucose, capillary     Status: Abnormal   Collection Time: 12/04/20  9:29 AM  Result Value Ref Range   Glucose-Capillary 202 (H) 70 - 99 mg/dL  Glucose, capillary     Status: Abnormal   Collection Time: 12/04/20 10:23 AM  Result Value Ref Range   Glucose-Capillary 158 (H) 70 - 99 mg/dL   Comment 1 Notify RN    Comment 2 Document in Chart   Glucose, capillary     Status: Abnormal   Collection Time: 12/04/20 11:28 AM  Result Value Ref Range   Glucose-Capillary 131 (H) 70 - 99 mg/dL  Glucose, capillary     Status: Abnormal   Collection Time: 12/04/20 12:04 PM  Result Value Ref Range   Glucose-Capillary 130 (H) 70 - 99 mg/dL    Recent Results (from the past 240 hour(s))  Resp Panel by RT-PCR (Flu A&B, Covid) Nasopharyngeal Swab     Status: None   Collection Time: 12/02/20 12:17 PM   Specimen: Nasopharyngeal Swab; Nasopharyngeal(NP) swabs in vial transport medium  Result Value Ref Range Status   SARS Coronavirus 2 by RT PCR NEGATIVE NEGATIVE Final    Comment: (NOTE) SARS-CoV-2 target nucleic acids are NOT DETECTED.  The  SARS-CoV-2 RNA is generally detectable in upper respiratory specimens during the acute phase of infection. The lowest concentration of SARS-CoV-2 viral copies this assay can detect is 138 copies/mL. A negative result does not preclude SARS-Cov-2 infection and should not be used as the sole basis for treatment or other patient management decisions. A negative result may occur with  improper specimen collection/handling, submission of specimen other than nasopharyngeal swab, presence of  viral mutation(s) within the areas targeted by this assay, and inadequate number of viral copies(<138 copies/mL). A negative result must be combined with clinical observations, patient history, and epidemiological information. The expected result is Negative.  Fact Sheet for Patients:  EntrepreneurPulse.com.au  Fact Sheet for Healthcare Providers:  IncredibleEmployment.be  This test is no t yet approved or cleared by the [place] FDA and  has been authorized for detection and/or diagnosis of SARS-CoV-2 by FDA under an Emergency Use Authorization (EUA). This EUA will remain  in effect (meaning this test can be used) for the duration of the COVID-19 declaration under Section 564(b)(1) of the Act, 21 U.S.C.section 360bbb-3(b)(1), unless the authorization is terminated  or revoked sooner.       Influenza A by PCR NEGATIVE NEGATIVE Final   Influenza B by PCR NEGATIVE NEGATIVE Final    Comment: (NOTE) The Xpert Xpress SARS-CoV-2/FLU/RSV plus assay is intended as an aid in the diagnosis of influenza from Nasopharyngeal swab specimens and should not be used as a sole basis for treatment. Nasal washings and aspirates are unacceptable for Xpert Xpress SARS-CoV-2/FLU/RSV testing.  Fact Sheet for Patients: EntrepreneurPulse.com.au  Fact Sheet for Healthcare Providers: IncredibleEmployment.be  This test is not yet approved or  cleared by the [place] FDA and has been authorized for detection and/or diagnosis of SARS-CoV-2 by FDA under an Emergency Use Authorization (EUA). This EUA will remain in effect (meaning this test can be used) for the duration of the COVID-19 declaration under Section 564(b)(1) of the Act, 21 U.S.C. section 360bbb-3(b)(1), unless the authorization is terminated or revoked.  Performed at [facility], [place] [address] [place], [place] [address]   Surgical pcr screen     Status: None   Collection Time: 12/02/20  8:10 PM   Specimen: Nasal Mucosa; Nasal Swab  Result Value Ref Range Status   MRSA, PCR NEGATIVE NEGATIVE Final   Staphylococcus aureus NEGATIVE NEGATIVE Final    Comment: (NOTE) The Xpert SA Assay (FDA approved for NASAL specimens in patients 50 years of age and older), is one component of a comprehensive surveillance program. It is not intended to diagnose infection nor to guide or monitor treatment. Performed at [facility], [place] [address], [place], [place] [address]      Radiology Studies: DG Knee Complete 4 Views Right  Result Date: 12/04/2020 CLINICAL DATA:  Surgery, elective. Additional history provided: ORIF right tibia. Provided fluoroscopy time 1:20 0.1 seconds (20.5 mGy). EXAM: RIGHT KNEE - COMPLETE 4+ VIEW; DG C-ARM 1-60 MIN COMPARISON:  CT of the right knee 12/02/2020. FINDINGS: Seven intraoperative fluoroscopic images of the right knee are submitted. The images demonstrate interval open reduction and internal fixation of a known comminuted fracture of the proximal tibial metaphysis utilizing a lateral plate and screws. Alignment is near anatomic. Redemonstrated known fracture of the fibular head. IMPRESSION: Seven intraoperative fluoroscopic images of the right knee from ORIF of a comminuted fracture of the proximal tibial metaphysis. Electronically Signed   By: Kellie Simmering DO   On: 12/04/2020 10:27   DG Knee Right Port  Result  Date: 12/04/2020 CLINICAL DATA:  Postop right knee repair. EXAM: PORTABLE RIGHT KNEE - 1-2 VIEW COMPARISON:  CT 12/02/2020. FINDINGS: Plate and screw fixation of the proximal tibia noted. Hardware intact. Near anatomic alignment. Nondisplaced proximal fibular fracture. Small knee joint effusion. IMPRESSION: ORIF of the proximal tibia. Hardware intact. Near anatomic alignment. Electronically Signed   By: Marcello Moores  Register   On: 12/04/2020  13:39   DG C-Arm 1-60 Min  Result Date: 12/04/2020 CLINICAL DATA:  Surgery, elective. Additional history provided: ORIF right tibia. Provided fluoroscopy time 1:20 0.1 seconds (20.5 mGy). EXAM: RIGHT KNEE - COMPLETE 4+ VIEW; DG C-ARM 1-60 MIN COMPARISON:  CT of the right knee 12/02/2020. FINDINGS: Seven intraoperative fluoroscopic images of the right knee are submitted. The images demonstrate interval open reduction and internal fixation of a known comminuted fracture of the proximal tibial metaphysis utilizing a lateral plate and screws. Alignment is near anatomic. Redemonstrated known fracture of the fibular head. IMPRESSION: Seven intraoperative fluoroscopic images of the right knee from ORIF of a comminuted fracture of the proximal tibial metaphysis. Electronically Signed   By: Kellie Simmering DO   On: 12/04/2020 10:27   DG Knee Right Port  Final Result    DG C-Arm 1-60 Min  Final Result    DG Knee Complete 4 Views Right  Final Result    CT Knee Right Wo Contrast  Final Result    DG Chest Portable 1 View  Final Result    DG Knee Complete 4 Views Right  Final Result    DG Ankle Complete Right  Final Result    DG Tibia/Fibula Right  Final Result      Scheduled Meds: . brimonidine  1 drop Left Eye BID  . cycloSPORINE  1 drop Both Eyes BID  . dorzolamide-timolol  1 drop Left Eye BID  . [START ON 12/05/2020] enoxaparin (LOVENOX) injection  40 mg Subcutaneous Q24H  . gabapentin  300 mg Oral QHS  . insulin aspart  0-9 Units Subcutaneous TID WC  . insulin  glargine  5 Units Subcutaneous Daily  . [START ON 12/05/2020] levothyroxine  75 mcg Oral Q0600  . sodium chloride flush  3 mL Intravenous Q12H   PRN Meds: acetaminophen, hydrALAZINE, HYDROcodone-acetaminophen, labetalol, morphine injection, ondansetron (ZOFRAN) IV, oxyCODONE, polyethylene glycol, polyvinyl alcohol Continuous Infusions: . clindamycin (CLEOCIN) IV       LOS: 2 days  Time spent: Greater than 50% of the 35 minute visit was spent in counseling/coordination of care for the patient as laid out in the A&P.   Dwyane Dee, MD [facility] 12/04/2020, 2:48 PM

## 2020-12-04 NOTE — Hospital Course (Addendum)
Jennifer Bailey is an 86 yo female with PMH DMII, neuropathy, HTN, HLD, osteoporosis, hypothyroidism who presented to the hospital after a mechanical fall at home, tripping down 3 stairs on her deck.  She struck her right knee and leg on the ground.  No reports of head injury or loss of consciousness. On evaluation with x-ray of right lower extremity she was found to have a proximal tibial fracture and fibular neck fracture.   She underwent ORIF on 12/04/20. Post op rec's are for weight bearing for transfers only.  She will remain weightbearing for transfers only until follow-up outpatient with orthopedic surgery and further imaging before advancing weightbearing status.  Given her advanced age, recent surgery, prolonged immobility expected, and long recovery course, patient does have an increased chance of 13-month mortality risk.  Consider further goals of care discussions at follow-up after discharge especially if patient readmitted soon after discharge.

## 2020-12-04 NOTE — Progress Notes (Signed)
PT Cancellation Note  Patient Details Name: Jennifer Bailey MRN: [id number] DOB: Aug 30, 1935   Cancelled Treatment:    Reason Eval/Treat Not Completed: Pain limiting ability to participate. Pt complaining of pain and nausea when PT arrived. RN contacted and present by end of encounter to provide medication. Pt asks that PT wait until tomorrow. Will check back tomorrow per pt request.   Thelma Comp 12/04/2020, 1:56 PM   Rolinda Roan, PT, DPT Acute Rehabilitation Services Pager: [phone] Office: [phone]

## 2020-12-04 NOTE — Assessment & Plan Note (Signed)
-   holding statin for now

## 2020-12-04 NOTE — Assessment & Plan Note (Signed)
>>  ASSESSMENT AND PLAN FOR TYPE II DIABETES MELLITUS, UNCONTROLLED WRITTEN ON 12/08/2020  3:44 PM BY Sabino Gasser, DAVID, MD  - continue lantus and SSI - continue CBG monitoring  - We will continue to adjust Lantus as needed

## 2020-12-04 NOTE — Anesthesia Postprocedure Evaluation (Signed)
Anesthesia Post Note  Patient: Jennifer Bailey  Procedure(s) Performed: OPEN REDUCTION INTERNAL FIXATION (ORIF) TIBIAL PLATEAU (Right Leg Lower)     Patient location during evaluation: PACU Anesthesia Type: General Level of consciousness: awake and alert Pain management: pain level controlled Vital Signs Assessment: post-procedure vital signs reviewed and stable Respiratory status: spontaneous breathing, nonlabored ventilation, respiratory function stable and patient connected to nasal cannula oxygen Cardiovascular status: blood pressure returned to baseline and stable Postop Assessment: no apparent nausea or vomiting Anesthetic complications: no   No complications documented.  Last Vitals:  Vitals:   12/04/20 1301 12/04/20 1731  BP: (!) 119/35 (!) 114/38  Pulse: 79 93  Resp: 17 17  Temp: 36.8 C 37 C  SpO2: 97% 93%    Last Pain:  Vitals:   12/04/20 1731  TempSrc: Oral  PainSc:                  Yeny Schmoll COKER

## 2020-12-04 NOTE — Assessment & Plan Note (Signed)
continue current regimen.

## 2020-12-04 NOTE — H&P (View-Only) (Signed)
Patient seen and examined and agree with the note below.  86 year old female with a right proximal tibia fracture/tibial plateau fracture.  She will require open reduction internal fixation.  She does have a history of diabetes on insulin.  I discussed risks and benefits with the patient and her daughters.  Risks included but not limited to bleeding, infection, nonunion, malunion, nerve and blood vessel injury, DVT, knee stiffness, posttraumatic arthritis, even the possibility of anesthetic complications.  The patient and her daughters agreed to proceed with surgery and consent was obtained.  Shona Needles, MD [facility] [phone] (office) [email or web address]

## 2020-12-04 NOTE — Assessment & Plan Note (Addendum)
-   continue lantus and SSI - continue CBG monitoring  - We will continue to adjust Lantus as needed

## 2020-12-04 NOTE — Plan of Care (Signed)
  Problem: Pain Managment: Goal: General experience of comfort will improve Outcome: Progressing   Problem: Safety: Goal: Ability to remain free from injury will improve Outcome: Progressing   

## 2020-12-04 NOTE — Transfer of Care (Signed)
Immediate Anesthesia Transfer of Care Note  Patient: Jennifer Bailey  Procedure(s) Performed: OPEN REDUCTION INTERNAL FIXATION (ORIF) TIBIAL PLATEAU (Right Leg Lower)  Patient Location: PACU  Anesthesia Type:General  Level of Consciousness: awake, alert  and drowsy  Airway & Oxygen Therapy: Patient Spontanous Breathing and Patient connected to face mask oxygen  Post-op Assessment: Report given to RN and Post -op Vital signs reviewed and stable  Post vital signs: Reviewed and stable  Last Vitals:  Vitals Value Taken Time  BP 94/77 12/04/20 1023  Temp    Pulse 78 12/04/20 1027  Resp 19 12/04/20 1027  SpO2 99 % 12/04/20 1027  Vitals shown include unvalidated device data.  Last Pain:  Vitals:   12/04/20 0730  TempSrc:   PainSc: 0-No pain         Complications: No complications documented.

## 2020-12-04 NOTE — Interval H&P Note (Signed)
History and Physical Interval Note:  12/04/2020 8:23 AM  Jennifer Bailey  has presented today for surgery, with the diagnosis of Right tibia plateau fx.  The various methods of treatment have been discussed with the patient and family. After consideration of risks, benefits and other options for treatment, the patient has consented to  Procedure(s): OPEN REDUCTION INTERNAL FIXATION (ORIF) TIBIAL PLATEAU (Right) as a surgical intervention.  The patient's history has been reviewed, patient examined, no change in status, stable for surgery.  I have reviewed the patient's chart and labs.  Questions were answered to the patient's satisfaction.     Lennette Bihari P Taleya Whitcher

## 2020-12-04 NOTE — Op Note (Signed)
Orthopaedic Surgery Operative Note (CSN: [id number] ) Date of Surgery: 12/04/2020  Admit Date: 12/02/2020   Diagnoses: Pre-Op Diagnoses: Right bicondylar tibial plateau fracture  Post-Op Diagnosis: Same  Procedures: CPT 02542-[facility] reduction internal fixation of right tibial plateau fracture   Surgeons : Primary: Shona Needles, MD  Assistant: Patrecia Pace, PA-C  Location: OR 7   Anesthesia:General  Antibiotics: Clindamycin 900mg    Tourniquet time:None  Estimated Blood BJSE:83 mL  Complications:None   Specimens:None   Implants: Implant Name Type Inv. Item Serial No. Manufacturer Lot No. LRB No. Used Action  SCREW CORTEX 3.5 70MM SELF TAP - [id number] Screw SCREW CORTEX 3.5 70MM SELF TAP  DEPUY [employer]TH[employer] ON TRAY Right 1 Implanted  SCREW CORTEX 3.5 36MM - [id number] Screw SCREW CORTEX 3.5 36MM  DEPUY ORTHOPAEDICS ON TRAY Right 1 Implanted  SCREW CORTEX 3.5 38MM - [id number] Screw SCREW CORTEX 3.5 38MM  DEPUY ORTHOPAEDICS ON TRAY Right 1 Implanted  SCREW CORTEX 3.5 30MM - [id number] Screw SCREW CORTEX 3.5 30MM  DEPUY ORTHOPAED[employer] ON TRAY Right 2 Implanted  PLATE PROX TIBIA VA LCP 3.5MM - [id number] Plate PLATE PROX TIBIA VA LCP 3.5MM  DEPUY ORTHOPAED[employer] ON TRAY Right 1 Implanted  SCREW LOCKING 3.5X70MM VA - [id number] Screw SCREW LOCKING 3.5X70MM VA  DEPUY ORTHOPAEDICS ON TRAY Right 1 Implanted  SCREW VA-LOCKING 65MM 3.5 - [id number] Screw SCREW VA-LOCKING 65MM 3.5  DEPUY ORTHOPAEDICS ON TRAY Right 3 Implanted  SCREW LOCKING VA 3.5X75MM - [id number] Screw SCREW LOCKING VA 3.5X75MM  DEPUY ORTHOPAEDICS ON TRAY Right 1 Implanted     Indications for Surgery: 86 year old female with a ground-level fall sustaining a right bicondylar tibial plateau fracture.  Due to the unstable nature of her injury I recommend proceeding with open reduction internal fixation.  Risks and benefits were discussed with the patient and her daughters.  Risks included but not limited to bleeding, infection,  malunion, nonunion, hardware failure, hardware irritation, nerve or blood vessel injury, DVT, knee stiffness, even the possibility anesthetic complications.  The patient and her daughters agreed to proceed with surgery and consent was obtained.  Operative Findings: Open reduction internal fixation of right bicondylar tibial plateau fracture using Synthes VA 3.5 mm proximal tibial locking plate  Procedure: The patient was identified in the preoperative holding area. Consent was confirmed with the patient and their family and all questions were answered. The operative extremity was marked after confirmation with the patient. she was then brought back to the operating room by our anesthesia colleagues.  She was placed under general anesthetic and carefully transferred over to a radiolucent flat top table.  A bump was placed under her operative hip.  The right lower extremity was then prepped and draped in usual sterile fashion.  A timeout was performed to verify the patient, the procedure, and the extremity.  Preoperative antibiotics were dosed.  Fluoroscopic imaging was obtained to show the unstable nature of her injury.  A small incision over Gertie's tubercle was made and carried down through skin and subcutaneous tissue.  I incised through the IT band.  The anterior portion had avulsed off secondary to the fracture.  I perform subperiosteal dissection until I reached the posterior aspect of the proximal tibia and could palpate the fibular head.  After I exposed the lateral cortex I then proceeded to perform a reduction maneuver on the proximal tibia.  The main fracture component was through the metaphysis.  I was able to use my finger and assist with reduction along with traction to  get the fracture out apex anterior angulation.  Once I had adequate reduction I then performed across pinning of the fracture using 1.6 mm K wires.  I directed a K wire from the lateral cortex across the fracture into the medial  condyle.  The process was repeated with a percutaneous medial cortex pin into the lateral condyle.  Once I was pleased with the overall alignment I then used a Synthes 3.5 mm VA proximal tibial locking plate.  I positioned appropriately on the lateral condyle.  I then slid submuscularly along the lateral cortex of the tibial shaft.  Once I confirmed placement of the plate I then held it provisionally with a K wire.  I then placed a nonlocking 3.5 millimeter screw in the proximal condyles to bring the plate flush to bone.  I then placed a nonlocking screw into the tibial shaft to bring the plate flush distally.  The overall alignment was appropriate there was just a small amount of malreduction at the medial metaphysis which I felt was acceptable.  There is no major varus or valgus of the proximal tibia.  I then placed 3 more nonlocking screws into the tibial shaft.  I then returned to the proximal segment and placed a total of 5 more locking screws.  Final fluoroscopic imaging was obtained.  The incision was copiously irrigated.  A gram of vancomycin powder was placed into the incision.  The IT band was repaired to cover the plate.  The skin was then closed with 2-0 Vicryl and 3-0 nylon.  Sterile dressing was placed.  The patient was then awoken from anesthesia and taken to the PACU in stable condition.   Post Op Plan/Instructions: Patient be weightbearing for transfers only.  I do not want a walker ambulation.  She will receive postoperative clindamycin.  She will receive Lovenox for DVT prophylaxis.  We will have her mobilize with physical and Occupational Therapy.  I was present and performed the entire surgery.  Patrecia Pace, PA-C did assist me throughout the case. An assistant was necessary given the difficulty in approach, maintenance of reduction and ability to instrument the fracture.   Katha Hamming, MD [facility]

## 2020-12-04 NOTE — Anesthesia Procedure Notes (Signed)
Procedure Name: Intubation Date/Time: 12/04/2020 8:54 AM Performed by: Trinna Post., CRNA Pre-anesthesia Checklist: Patient identified, Emergency Drugs available, Suction available, Patient being monitored and Timeout performed Patient Re-evaluated:Patient Re-evaluated prior to induction Oxygen Delivery Method: Circle system utilized Preoxygenation: Pre-oxygenation with 100% oxygen Induction Type: IV induction Ventilation: Mask ventilation without difficulty Laryngoscope Size: Mac and 3 Grade View: Grade I Tube type: Oral Tube size: 7.5 mm Number of attempts: 1 Airway Equipment and Method: Stylet Placement Confirmation: ETT inserted through vocal cords under direct vision,  positive ETCO2 and breath sounds checked- equal and bilateral Secured at: 21 cm Tube secured with: Tape Dental Injury: Teeth and Oropharynx as per pre-operative assessment

## 2020-12-04 NOTE — Progress Notes (Signed)
Patient seen and examined and agree with the note below.  86 year old female with a right proximal tibia fracture/tibial plateau fracture.  She will require open reduction internal fixation.  She does have a history of diabetes on insulin.  I discussed risks and benefits with the patient and her daughters.  Risks included but not limited to bleeding, infection, nonunion, malunion, nerve and blood vessel injury, DVT, knee stiffness, posttraumatic arthritis, even the possibility of anesthetic complications.  The patient and her daughters agreed to proceed with surgery and consent was obtained.  Shona Needles, MD [facility] [phone] (office) [email or web address]

## 2020-12-05 ENCOUNTER — Encounter (HOSPITAL_COMMUNITY): Payer: Self-pay | Admitting: Student

## 2020-12-05 DIAGNOSIS — D62 Acute posthemorrhagic anemia: Secondary | ICD-10-CM

## 2020-12-05 DIAGNOSIS — S82401A Unspecified fracture of shaft of right fibula, initial encounter for closed fracture: Secondary | ICD-10-CM | POA: Diagnosis not present

## 2020-12-05 DIAGNOSIS — S82201A Unspecified fracture of shaft of right tibia, initial encounter for closed fracture: Secondary | ICD-10-CM | POA: Diagnosis not present

## 2020-12-05 LAB — BASIC METABOLIC PANEL
Anion gap: 9 (ref 5–15)
BUN: 12 mg/dL (ref 8–23)
CO2: 21 mmol/L — ABNORMAL LOW (ref 22–32)
Calcium: 8.1 mg/dL — ABNORMAL LOW (ref 8.9–10.3)
Chloride: 103 mmol/L (ref 98–111)
Creatinine, Ser: 0.95 mg/dL (ref 0.44–1.00)
GFR, Estimated: 59 mL/min — ABNORMAL LOW (ref >60)
Glucose, Bld: 268 mg/dL — ABNORMAL HIGH (ref 70–99)
Potassium: 4.2 mmol/L (ref 3.5–5.1)
Sodium: 133 mmol/L — ABNORMAL LOW (ref 135–145)

## 2020-12-05 LAB — CBC
HCT: 21.8 % — ABNORMAL LOW (ref 36.0–46.0)
Hemoglobin: 7.4 g/dL — ABNORMAL LOW (ref 12.0–15.0)
MCH: 30.6 pg (ref 26.0–34.0)
MCHC: 33.9 g/dL (ref 30.0–36.0)
MCV: 90.1 fL (ref 80.0–100.0)
Platelets: 195 10*3/uL (ref 150–400)
RBC: 2.42 MIL/uL — ABNORMAL LOW (ref 3.87–5.11)
RDW: 14 % (ref 11.5–15.5)
WBC: 10 10*3/uL (ref 4.0–10.5)
nRBC: 0 % (ref 0.0–0.2)

## 2020-12-05 LAB — GLUCOSE, CAPILLARY
Glucose-Capillary: 220 mg/dL — ABNORMAL HIGH (ref 70–99)
Glucose-Capillary: 252 mg/dL — ABNORMAL HIGH (ref 70–99)
Glucose-Capillary: 255 mg/dL — ABNORMAL HIGH (ref 70–99)
Glucose-Capillary: 317 mg/dL — ABNORMAL HIGH (ref 70–99)

## 2020-12-05 LAB — MAGNESIUM: Magnesium: 1.7 mg/dL (ref 1.7–2.4)

## 2020-12-05 LAB — PREPARE RBC (CROSSMATCH)

## 2020-12-05 MED ORDER — INSULIN GLARGINE 100 UNIT/ML ~~LOC~~ SOLN
10.0000 [IU] | Freq: Every day | SUBCUTANEOUS | Status: DC
  Administered 2020-12-06: 10 [IU] via SUBCUTANEOUS
  Filled 2020-12-05 (×2): qty 0.1

## 2020-12-05 MED ORDER — SODIUM CHLORIDE 0.9% IV SOLUTION: Freq: Once | INTRAVENOUS | Status: AC

## 2020-12-05 MED ORDER — ENOXAPARIN SODIUM 40 MG/0.4ML ~~LOC~~ SOLN
40.0000 mg | SUBCUTANEOUS | Status: DC
  Administered 2020-12-06 – 2020-12-10 (×5): 40 mg via SUBCUTANEOUS
  Filled 2020-12-05 (×4): qty 0.4

## 2020-12-05 MED ORDER — INSULIN GLARGINE 100 UNIT/ML ~~LOC~~ SOLN
5.0000 [IU] | Freq: Once | SUBCUTANEOUS | Status: AC
  Administered 2020-12-05: 5 [IU] via SUBCUTANEOUS
  Filled 2020-12-05: qty 0.05

## 2020-12-05 NOTE — Evaluation (Signed)
Occupational Therapy Evaluation Patient Details Name: Jennifer Bailey MRN: [id number] DOB: 07-28-1935 Today's Date: 12/05/2020    History of Present Illness Pt is 86 yo female admitted on 12/02/20 aftr a fall down steps and found to have R proximal tibial fracture and fibular neck fracture.  She is s/p ORIF R tibial plateau fx on 12/04/20.  Pt with PMH including DM, neuropathy, HTN, HLD, osteoporosis, and hypothyroidism.   Clinical Impression   Pt presents with decline in function and safety with ADLs and ADL mobility with impaired strength, balance and endurance. PTA, pt lived at home with her 2 daughters that both work. Pt was Ind with ADLs/selfcare and mobility. Pt currently requires +2 assist to sit EOB, mod A +, max - total A with LB ADL/toileting and mod A +2 for SPTs to [facility]. Once pt SPT to recliner she became lethargic and less responsive; checked and Bps were soft but near her recent baseline (102/26, 114/39, 119/40); RN aware and monitoring.  Pt would benefit from acute OT services to address impairments and maximize level of function and safety    Follow Up Recommendations  SNF    Equipment Recommendations  3 in 1 bedside commode;Other (comment) (RW, TBD at next venue of care)    Recommendations for Other Services       Precautions / Restrictions Precautions Precautions: Fall Restrictions Weight Bearing Restrictions: Yes RLE Weight Bearing: Weight bearing as tolerated Other Position/Activity Restrictions: R LE WBAT for transfers only, NOT ambulation      Mobility Bed Mobility Overal bed mobility: Needs Assistance Bed Mobility: Supine to Sit     Supine to sit: Mod assist     General bed mobility comments: Pt requiring min A for R LE to EOB and was able to scoot toward EOB with cues for bed rail use; did need mod A to complete raising trunk and scooting to EOB    Transfers Overall transfer level: Needs assistance Equipment used: Rolling walker (2 wheeled) Transfers:  Sit to/from [facility] Sit to Stand: Mod assist;+2 physical assistance Stand pivot transfers: Mod assist;+2 physical assistance       General transfer comment: Performed sit to stand from bed and BSC - multiple cues for safe hand placement, stand pivot from bed to bsc to recliner toward L side - multiple cues to stand straight and push into walker to assist in relieving weight on R LE.    Balance Overall balance assessment: Needs assistance Sitting-balance support: No upper extremity supported Sitting balance-Leahy Scale: Fair     Standing balance support: Bilateral upper extremity supported;During functional activity Standing balance-Leahy Scale: Poor Standing balance comment: Requiring assist and multiple cues to stand upright                           ADL either performed or assessed with clinical judgement   ADL Overall ADL's : Needs assistance/impaired Eating/Feeding: Set up;Independent;Sitting   Grooming: Wash/dry hands;Wash/dry face;Min guard;Sitting   Upper Body Bathing: Min guard;Sitting   Lower Body Bathing: Maximal assistance   Upper Body Dressing : Min guard;Sitting   Lower Body Dressing: Total assistance   Toilet Transfer: +2 for safety/equipment;Stand-pivot;BSC;Cueing for safety;Cueing for sequencing;RW;Moderate assistance   Toileting- Clothing Manipulation and Hygiene: Total assistance;Sit to/from stand       Functional mobility during ADLs: Minimal assistance;+2 for physical assistance;Cueing for sequencing;Cueing for safety;Rolling walker       Vision Baseline Vision/History: Wears glasses Wears Glasses: At all  times Patient Visual Report: No change from baseline       Perception     Praxis      Pertinent Vitals/Pain Pain Assessment: Faces Faces Pain Scale: Hurts even more Pain Location: R LE with movement Pain Descriptors / Indicators: Throbbing;Sore Pain Intervention(s): Monitored during session;Limited activity  within patient's tolerance;Premedicated before session;Repositioned     Hand Dominance Right   Extremity/Trunk Assessment Upper Extremity Assessment Upper Extremity Assessment: Generalized weakness   Lower Extremity Assessment Lower Extremity Assessment: Defer to PT evaluation RLE Deficits / Details: ROM: ankle - tolerates gentle ROM, unable to get neutral DF due to pain, knee ~10 to 70 degrees , hip WFL;  MMT: ankle and knee 1/5, hip 2/5 LLE Deficits / Details: WFL   Cervical / Trunk Assessment Cervical / Trunk Assessment: Kyphotic   Communication Communication Communication: No difficulties   Cognition Arousal/Alertness: Awake/alert Behavior During Therapy: WFL for tasks assessed/performed Overall Cognitive Status: Within Functional Limits for tasks assessed                                     General Comments  Once pt in chair, she became more lethargic, less talkative/responsive, and closing eyes.  HR 70's , O2 sats 90% on RA, and BP as follow 102/26 initially, 114/39 repeat, and 119/40 after reclined more.  RN present and monitoring.  Pt becoming more alert once reclined but not back to baseline.  Noted BP's have been soft, RN reports did just give pain about 30 mins ago.    Exercises     Shoulder Instructions      Home Living Family/patient expects to be discharged to:: Private residence Living Arrangements: Children (2 daughters live with her, both work) Available Help at Discharge: Family;Available PRN/intermittently Type of Home: House Home Access: Stairs to enter [employer] of Steps: 3 Entrance Stairs-Rails: Right;Left Home Layout: One level     Bathroom Shower/Tub: Occupational psychologist: Handicapped height     Home Equipment: Shower seat - built in;Grab bars - tub/shower   Additional Comments: maybe has RW      Prior Functioning/Environment Level of Independence: Independent with assistive device(s)  Gait /  Transfers Assistance Needed: reports she uses no AD for mobility; ambulates short community distances ADL's / Homemaking Assistance Needed: Ind with ADLs/selfcare, drives a little   Comments: can do simple meal prep, but daughter assist mostly with home mgt; does work out videos daily with daughter        OT Problem List: Decreased strength;Impaired balance (sitting and/or standing);Pain;Decreased activity tolerance;Decreased knowledge of use of DME or AE      OT Treatment/Interventions: Self-care/ADL training;DME and/or AE instruction;Therapeutic activities;Balance training;Therapeutic exercise;Patient/family education    OT Goals(Current goals can be found in the care plan section) Acute Rehab OT Goals Patient Stated Goal: return home OT Goal Formulation: With patient Time For Goal Achievement: 12/19/20 Potential to Achieve Goals: Good ADL Goals Pt Will Perform Grooming: with supervision;with set-up;sitting Pt Will Perform Upper Body Bathing: with supervision;with set-up;sitting Pt Will Perform Lower Body Bathing: with mod assist;sitting/lateral leans;with adaptive equipment Pt Will Perform Upper Body Dressing: with supervision;with set-up;sitting Pt Will Transfer to Toilet: with mod assist;with min assist;stand pivot transfer;bedside commode  OT Frequency: Min 2X/week   Barriers to D/C:            Co-evaluation PT/OT/SLP Co-Evaluation/Treatment: Yes Reason for Co-Treatment: Complexity of the patient's  impairments (multi-system involvement);For patient/therapist safety;To address functional/ADL transfers PT goals addressed during session: Mobility/safety with mobility OT goals addressed during session: ADL's and self-care;Proper use of Adaptive equipment and DME      AM-PAC OT "6 Clicks" Daily Activity     Outcome Measure Help from another person eating meals?: None Help from another person taking care of personal grooming?: A Little Help from another person toileting,  which includes using toliet, bedpan, or urinal?: Total Help from another person bathing (including washing, rinsing, drying)?: A Lot Help from another person to put on and taking off regular upper body clothing?: A Little Help from another person to put on and taking off regular lower body clothing?: Total 6 Click Score: 14   End of Session Equipment Utilized During Treatment: Gait belt;Rolling walker;Other (comment) [facility]) Nurse Communication: Mobility status;Other (comment) (made RW aware of low BPs)  Activity Tolerance: Other (comment);Patient limited by lethargy (BP dropping) Patient left: in chair;with call bell/phone within reach;with chair alarm set;with nursing/sitter in room  OT Visit Diagnosis: Unsteadiness on feet (R26.81);Other abnormalities of gait and mobility (R26.89);Muscle weakness (generalized) (M62.81);History of falling (Z91.81);Pain Pain - Right/Left: Right Pain - part of body: Leg                Time: 4037-0964 OT Time Calculation (min): 45 min Charges:  OT General Charges $OT Visit: 1 Visit OT Evaluation $OT Eval Moderate Complexity: 1 Mod    Britt Bottom 12/05/2020, 4:39 PM

## 2020-12-05 NOTE — Care Management Important Message (Signed)
Important Message  Patient Details  Name: Jennifer Bailey MRN: [id number] Date of Birth: 10-11-1934   Medicare Important Message Given:  Yes     Megan P Shular 12/05/2020, 2:39 PM

## 2020-12-05 NOTE — Progress Notes (Signed)
PROGRESS NOTE    Jennifer Bailey   OEV:[id number]  DOB: 03-01-1935  DOA: 12/02/2020     3  PCP: System, Provider Not In  CC: fall at home  Hospital Course: Jennifer Bailey is an 86 yo female with PMH DMII, neuropathy, HTN, HLD, osteoporosis, hypothyroidism who presented to the hospital after a mechanical fall at home, tripping down 3 stairs on her deck.  She struck her right knee and leg on the ground.  No reports of head injury or loss of consciousness. On evaluation with x-ray of right lower extremity she was found to have a proximal tibial fracture and fibular neck fracture.   She underwent ORIF on 12/04/20. Post op rec's are for weight bearing for transfers only.    Interval History:  No events overnight. Awake and alert this am. Pain controlled; planning to work with PT she says.   ROS: Constitutional: negative for chills, fatigue and fevers, Respiratory: negative for cough, Cardiovascular: negative for chest pain and Gastrointestinal: negative for abdominal pain  Assessment & Plan:  * Tibia/fibula fracture - s/p mechanical fall at home - continue pain control - s/p ORIF on 3/2 - per ortho: weightbearing for transfers only; no walker ambulation - PT eval pending   Type II diabetes mellitus, uncontrolled (HCC) - continue lantus and SSI - continue CBG monitoring   Acute blood loss anemia - Hgb down to 7.4 g/dL today with hypotension - likely some ABLA from surgery - transfuse 1 unit PRBC today and repeat CBC in am   Glaucoma - continue home eye drops  Hyperlipidemia associated with type 2 diabetes mellitus ([place]) - holding statin for now  Hypertension associated with diabetes ([place]) - continue current regimen   Old records reviewed in assessment of this patient  Antimicrobials: n/a  DVT prophylaxis: enoxaparin (LOVENOX) injection 40 mg Start: 12/06/20 0000   Code Status:   Code Status: Full Code Family Communication: daughter  Disposition Plan: Status is:  Inpatient  Remains inpatient appropriate because:Ongoing diagnostic testing needed not appropriate for outpatient work up, IV treatments appropriate due to intensity of illness or inability to take PO and Inpatient level of care appropriate due to severity of illness   Dispo: The patient is from: Home              Anticipated d/c is to: pending PT eval after surgery              Patient currently is not medically stable to d/c.   Difficult to place patient No  Risk of unplanned readmission score: Unplanned Admission- Pilot do not use: 15.18   Objective: Blood pressure (!) 130/41, pulse 79, temperature 98.6 F (37 C), temperature source Oral, resp. rate 18, height 4\' 10"  (1.473 m), weight 58.1 kg, SpO2 95 %.  Examination: General appearance: alert, cooperative and no distress Head: Normocephalic, without obvious abnormality, atraumatic Eyes: EOMI Lungs: clear to auscultation bilaterally Heart: regular rate and rhythm and S1, S2 normal Abdomen: normal findings: bowel sounds normal and soft, non-tender Extremities: RLE wrapped in bandage postop; sensation intact and soft compartments Skin: mobility and turgor normal Neurologic: Grossly normal  Consultants:   Ortho  Procedures:     Data Reviewed: I have personally reviewed following labs and imaging studies Results for orders placed or performed during the hospital encounter of 12/02/20 (from the past 24 hour(s))  Glucose, capillary     Status: Abnormal   Collection Time: 12/04/20  4:53 PM  Result Value Ref Range  Glucose-Capillary 308 (H) 70 - 99 mg/dL  Glucose, capillary     Status: Abnormal   Collection Time: 12/04/20  8:53 PM  Result Value Ref Range   Glucose-Capillary 218 (H) 70 - 99 mg/dL  Basic metabolic panel     Status: Abnormal   Collection Time: 12/05/20  1:10 AM  Result Value Ref Range   Sodium 133 (L) 135 - 145 mmol/L   Potassium 4.2 3.5 - 5.1 mmol/L   Chloride 103 98 - 111 mmol/L   CO2 21 (L) 22 - 32  mmol/L   Glucose, Bld 268 (H) 70 - 99 mg/dL   BUN 12 8 - 23 mg/dL   Creatinine, Ser 0.95 0.44 - 1.00 mg/dL   Calcium 8.1 (L) 8.9 - 10.3 mg/dL   GFR, Estimated 59 (L) >60 mL/min   Anion gap 9 5 - 15  CBC     Status: Abnormal   Collection Time: 12/05/20  1:10 AM  Result Value Ref Range   WBC 10.0 4.0 - 10.5 K/uL   RBC 2.42 (L) 3.87 - 5.11 MIL/uL   Hemoglobin 7.4 (L) 12.0 - 15.0 g/dL   HCT 21.8 (L) 36.0 - 46.0 %   MCV 90.1 80.0 - 100.0 fL   MCH 30.6 26.0 - 34.0 pg   MCHC 33.9 30.0 - 36.0 g/dL   RDW 14.0 11.5 - 15.5 %   Platelets 195 150 - 400 K/uL   nRBC 0.0 0.0 - 0.2 %  Magnesium     Status: None   Collection Time: 12/05/20  1:10 AM  Result Value Ref Range   Magnesium 1.7 1.7 - 2.4 mg/dL  Glucose, capillary     Status: Abnormal   Collection Time: 12/05/20  6:21 AM  Result Value Ref Range   Glucose-Capillary 317 (H) 70 - 99 mg/dL  Glucose, capillary     Status: Abnormal   Collection Time: 12/05/20 11:29 AM  Result Value Ref Range   Glucose-Capillary 252 (H) 70 - 99 mg/dL  Prepare RBC (crossmatch)     Status: None   Collection Time: 12/05/20 11:44 AM  Result Value Ref Range   Order Confirmation      ORDER PROCESSED BY BLOOD BANK Performed at [facility], [address], [place], [place] [address]     Recent Results (from the past 240 hour(s))  Resp Panel by RT-PCR (Flu A&B, Covid) Nasopharyngeal Swab     Status: None   Collection Time: 12/02/20 12:17 PM   Specimen: Nasopharyngeal Swab; Nasopharyngeal(NP) swabs in vial transport medium  Result Value Ref Range Status   SARS Coronavirus 2 by RT PCR NEGATIVE NEGATIVE Final    Comment: (NOTE) SARS-CoV-2 target nucleic acids are NOT DETECTED.  The SARS-CoV-2 RNA is generally detectable in upper respiratory specimens during the acute phase of infection. The lowest concentration of SARS-CoV-2 viral copies this assay can detect is 138 copies/mL. A negative result does not preclude SARS-Cov-2 infection and should not be  used as the sole basis for treatment or other patient management decisions. A negative result may occur with  improper specimen collection/handling, submission of specimen other than nasopharyngeal swab, presence of viral mutation(s) within the areas targeted by this assay, and inadequate number of viral copies(<138 copies/mL). A negative result must be combined with clinical observations, patient history, and epidemiological information. The expected result is Negative.  Fact Sheet for Patients:  EntrepreneurPulse.com.au  Fact Sheet for Healthcare Providers:  IncredibleEmployment.be  This test is no t yet approved or cleared  by the [place] and  has been authorized for detection and/or diagnosis of SARS-CoV-2 by FDA under an Emergency Use Authorization (EUA). This EUA will remain  in effect (meaning this test can be used) for the duration of the COVID-19 declaration under Section 564(b)(1) of the Act, 21 U.S.C.section 360bbb-3(b)(1), unless the authorization is terminated  or revoked sooner.       Influenza A by PCR NEGATIVE NEGATIVE Final   Influenza B by PCR NEGATIVE NEGATIVE Final    Comment: (NOTE) The Xpert Xpress SARS-CoV-2/FLU/RSV plus assay is intended as an aid in the diagnosis of influenza from Nasopharyngeal swab specimens and should not be used as a sole basis for treatment. Nasal washings and aspirates are unacceptable for Xpert Xpress SARS-CoV-2/FLU/RSV testing.  Fact Sheet for Patients: EntrepreneurPulse.com.au  Fact Sheet for Healthcare Providers: IncredibleEmployment.be  This test is not yet approved or cleared by the [place] FDA and has been authorized for detection and/or diagnosis of SARS-CoV-2 by FDA under an Emergency Use Authorization (EUA). This EUA will remain in effect (meaning this test can be used) for the duration of the COVID-19 declaration under Section  564(b)(1) of the Act, 21 U.S.C. section 360bbb-3(b)(1), unless the authorization is terminated or revoked.  Performed at [facility], [place] [address], [place], [place] [address]   Surgical pcr screen     Status: None   Collection Time: 12/02/20  8:10 PM   Specimen: Nasal Mucosa; Nasal Swab  Result Value Ref Range Status   MRSA, PCR NEGATIVE NEGATIVE Final   Staphylococcus aureus NEGATIVE NEGATIVE Final    Comment: (NOTE) The Xpert SA Assay (FDA approved for NASAL specimens in patients 54 years of age and older), is one component of a comprehensive surveillance program. It is not intended to diagnose infection nor to guide or monitor treatment. Performed at [facility], [place] [address], [place], [place] [address]      Radiology Studies: DG Knee Complete 4 Views Right  Result Date: 12/04/2020 CLINICAL DATA:  Surgery, elective. Additional history provided: ORIF right tibia. Provided fluoroscopy time 1:20 0.1 seconds (20.5 mGy). EXAM: RIGHT KNEE - COMPLETE 4+ VIEW; DG C-ARM 1-60 MIN COMPARISON:  CT of the right knee 12/02/2020. FINDINGS: Seven intraoperative fluoroscopic images of the right knee are submitted. The images demonstrate interval open reduction and internal fixation of a known comminuted fracture of the proximal tibial metaphysis utilizing a lateral plate and screws. Alignment is near anatomic. Redemonstrated known fracture of the fibular head. IMPRESSION: Seven intraoperative fluoroscopic images of the right knee from ORIF of a comminuted fracture of the proximal tibial metaphysis. Electronically Signed   By: Kellie Simmering DO   On: 12/04/2020 10:27   DG Knee Right Port  Result Date: 12/04/2020 CLINICAL DATA:  Postop right knee repair. EXAM: PORTABLE RIGHT KNEE - 1-2 VIEW COMPARISON:  CT 12/02/2020. FINDINGS: Plate and screw fixation of the proximal tibia noted. Hardware intact. Near anatomic alignment. Nondisplaced proximal fibular fracture. Small knee  joint effusion. IMPRESSION: ORIF of the proximal tibia. Hardware intact. Near anatomic alignment. Electronically Signed   By: Marcello Moores  Register   On: 12/04/2020 13:39   DG C-Arm 1-60 Min  Result Date: 12/04/2020 CLINICAL DATA:  Surgery, elective. Additional history provided: ORIF right tibia. Provided fluoroscopy time 1:20 0.1 seconds (20.5 mGy). EXAM: RIGHT KNEE - COMPLETE 4+ VIEW; DG C-ARM 1-60 MIN COMPARISON:  CT of the right knee 12/02/2020. FINDINGS: Seven intraoperative fluoroscopic images of the right knee are submitted.  The images demonstrate interval open reduction and internal fixation of a known comminuted fracture of the proximal tibial metaphysis utilizing a lateral plate and screws. Alignment is near anatomic. Redemonstrated known fracture of the fibular head. IMPRESSION: Seven intraoperative fluoroscopic images of the right knee from ORIF of a comminuted fracture of the proximal tibial metaphysis. Electronically Signed   By: Kellie Simmering DO   On: 12/04/2020 10:27   DG Knee Right Port  Final Result    DG C-Arm 1-60 Min  Final Result    DG Knee Complete 4 Views Right  Final Result    CT Knee Right Wo Contrast  Final Result    DG Chest Portable 1 View  Final Result    DG Knee Complete 4 Views Right  Final Result    DG Ankle Complete Right  Final Result    DG Tibia/Fibula Right  Final Result      Scheduled Meds: . brimonidine  1 drop Left Eye BID  . cycloSPORINE  1 drop Both Eyes BID  . dorzolamide-timolol  1 drop Left Eye BID  . [START ON 12/06/2020] enoxaparin (LOVENOX) injection  40 mg Subcutaneous Q24H  . gabapentin  300 mg Oral QHS  . insulin aspart  0-9 Units Subcutaneous TID WC  . insulin glargine  5 Units Subcutaneous Daily  . levothyroxine  75 mcg Oral Q0600  . sodium chloride flush  3 mL Intravenous Q12H   PRN Meds: acetaminophen, hydrALAZINE, HYDROcodone-acetaminophen, labetalol, morphine injection, ondansetron (ZOFRAN) IV, oxyCODONE, polyethylene glycol,  polyvinyl alcohol Continuous Infusions:    LOS: 3 days  Time spent: Greater than 50% of the 35 minute visit was spent in counseling/coordination of care for the patient as laid out in the A&P.   Dwyane Dee, MD [facility] 12/05/2020, 1:02 PM

## 2020-12-05 NOTE — Progress Notes (Signed)
Orthopaedic Trauma Progress Note  SUBJECTIVE: Doing fairly well this morning, reports mild pain about operative site which is currently well controlled. No chest pain. No SOB. No nausea/vomiting. No other complaints.  Has not been up out of bed yet.  OBJECTIVE:  Vitals:   12/05/20 0026 12/05/20 0442  BP: (!) 129/39 (!) 148/39  Pulse: 88 97  Resp: 20 20  Temp: 98.6 F (37 C) 98.7 F (37.1 C)  SpO2:  96%    General: Laying in bed, no acute distress Respiratory: No increased work of breathing.  Right lower extremity: Dressings clean, dry, intact.  Tenderness with palpation of the knee as well as down to the lower leg.  Tolerates very gentle ankle range of motion but does endorse some discomfort with this.  Lower leG swollen but compressible.  Able to wiggle toes.  Foot warm and well-perfused  IMAGING: Stable post op imaging.   LABS:  Results for orders placed or performed during the hospital encounter of 12/02/20 (from the past 24 hour(s))  Glucose, capillary     Status: Abnormal   Collection Time: 12/04/20  8:00 AM  Result Value Ref Range   Glucose-Capillary 316 (H) 70 - 99 mg/dL   Comment 1 Notify RN   Glucose, capillary     Status: Abnormal   Collection Time: 12/04/20  9:29 AM  Result Value Ref Range   Glucose-Capillary 202 (H) 70 - 99 mg/dL  Glucose, capillary     Status: Abnormal   Collection Time: 12/04/20 10:23 AM  Result Value Ref Range   Glucose-Capillary 158 (H) 70 - 99 mg/dL   Comment 1 Notify RN    Comment 2 Document in Chart   Glucose, capillary     Status: Abnormal   Collection Time: 12/04/20 11:28 AM  Result Value Ref Range   Glucose-Capillary 131 (H) 70 - 99 mg/dL  Glucose, capillary     Status: Abnormal   Collection Time: 12/04/20 12:04 PM  Result Value Ref Range   Glucose-Capillary 130 (H) 70 - 99 mg/dL  Glucose, capillary     Status: Abnormal   Collection Time: 12/04/20  4:53 PM  Result Value Ref Range   Glucose-Capillary 308 (H) 70 - 99 mg/dL   Glucose, capillary     Status: Abnormal   Collection Time: 12/04/20  8:53 PM  Result Value Ref Range   Glucose-Capillary 218 (H) 70 - 99 mg/dL  Basic metabolic panel     Status: Abnormal   Collection Time: 12/05/20  1:10 AM  Result Value Ref Range   Sodium 133 (L) 135 - 145 mmol/L   Potassium 4.2 3.5 - 5.1 mmol/L   Chloride 103 98 - 111 mmol/L   CO2 21 (L) 22 - 32 mmol/L   Glucose, Bld 268 (H) 70 - 99 mg/dL   BUN 12 8 - 23 mg/dL   Creatinine, Ser 0.95 0.44 - 1.00 mg/dL   Calcium 8.1 (L) 8.9 - 10.3 mg/dL   GFR, Estimated 59 (L) >60 mL/min   Anion gap 9 5 - 15  CBC     Status: Abnormal   Collection Time: 12/05/20  1:10 AM  Result Value Ref Range   WBC 10.0 4.0 - 10.5 K/uL   RBC 2.42 (L) 3.87 - 5.11 MIL/uL   Hemoglobin 7.4 (L) 12.0 - 15.0 g/dL   HCT 21.8 (L) 36.0 - 46.0 %   MCV 90.1 80.0 - 100.0 fL   MCH 30.6 26.0 - 34.0 pg   MCHC 33.9 30.0 -  36.0 g/dL   RDW 14.0 11.5 - 15.5 %   Platelets 195 150 - 400 K/uL   nRBC 0.0 0.0 - 0.2 %  Magnesium     Status: None   Collection Time: 12/05/20  1:10 AM  Result Value Ref Range   Magnesium 1.7 1.7 - 2.4 mg/dL  Glucose, capillary     Status: Abnormal   Collection Time: 12/05/20  6:21 AM  Result Value Ref Range   Glucose-Capillary 317 (H) 70 - 99 mg/dL    ASSESSMENT: Jennifer Bailey is a 86 y.o. female, 1 Day Post-Op s/p ORIF RIGHT TIBIAL PLATEAU  CV/Blood loss: Acute blood loss anemia, Hgb 7.4 this morning. Hemodynamically stable  PLAN: Weightbearing: WB RLE for transfers only. No walker ambulation Incisional and dressing care:  Reinforce as needed, plan to change dressing tomorrow 12/06/20 Showering: Hold off for now Orthopedic device(s): None  Pain management:  1. Tylenol 650 mg q 6 hours PRN 2. Norco 7.5-325 mg 6 hours PRN moderate pain 3. Oxycodone 5 mg q 6 hours PRN severe pain 4. Neurontin 300 mg QHS 5. Morphine 0.5-1 mg q 4 hours PRN VTE prophylaxis: Hold Lovenox today, SCDs for now ID: Clindamycin 900 mg post  op Foley/Lines:  No foley, KVO IVFs Impediments to Fracture Healing: Last drawn Vit D level 60, currently on Vit D2 50,000 units q7 days. No additional supplementation needed Dispo: PT/OT evaluation today, dispo pending. Continue to monitor CBC. Start Lovenox for DVT prophylaxis once Hgb stable. Will change dressing tomorrow   Follow - up plan: 2 weeks after hospital discharge for repeat x-rays and wound check  Contact information:  Katha Hamming MD, Patrecia Pace PA-C. After hours and holidays please check [email or web address] for group call information for [facility]   Haydin Calandra A. Ricci Barker, PA-C [phone] (office) [email or web address]

## 2020-12-05 NOTE — Plan of Care (Signed)
  Problem: Pain Managment: Goal: General experience of comfort will improve Outcome: Progressing   Problem: Safety: Goal: Ability to remain free from injury will improve Outcome: Progressing   

## 2020-12-05 NOTE — Evaluation (Signed)
Physical Therapy Evaluation Patient Details Name: Jennifer Bailey MRN: [id number] DOB: [birth year] Today's Date: 12/05/2020   History of Present Illness  Pt is 86 yo female admitted on 12/02/20 aftr a fall down steps and found to have R proximal tibial fracture and fibular neck fracture.  She is s/p ORIF R tibial plateau fx on 12/04/20.  Pt with PMH including DM, neuropathy, HTN, HLD, osteoporosis, and hypothyroidism.  Clinical Impression  Pt admitted with above diagnosis. Pt was normally independent and likes to perform exercises daily.  Today she required mod A of 2 for safety with transfers.  Pt requiring frequent cues for sequencing and transfer techniques.  Pt is not safe to return home and will need SNF at d/c. Pt did become less responsive once up in chair - checked and Bps were soft but near her recent baseline; RN aware and monitoring.  Pt currently with functional limitations due to the deficits listed below (see PT Problem List). Pt will benefit from skilled PT to increase their independence and safety with mobility to allow discharge to the venue listed below.       Follow Up Recommendations SNF    Equipment Recommendations  Wheelchair cushion (measurements PT);Wheelchair (measurements PT);3in1 (PT)    Recommendations for Other Services       Precautions / Restrictions Precautions Precautions: Fall Restrictions Weight Bearing Restrictions: Yes RLE Weight Bearing: Weight bearing as tolerated (FOR TRANSFERS ONLY)      Mobility  Bed Mobility Overal bed mobility: Needs Assistance Bed Mobility: Supine to Sit     Supine to sit: Mod assist     General bed mobility comments: Pt requiring min A for R LE to EOB and was able to scoot toward EOB with cues for bed rail use; did need mod A to complete raising trunk and scooting to EOB    Transfers Overall transfer level: Needs assistance Equipment used: Rolling walker (2 wheeled) Transfers: Sit to/from [employer] Sit to Stand: Min assist;+2 physical assistance Stand pivot transfers: Min assist;+2 physical assistance       General transfer comment: Performed sit to stand from bed and BSC - multiple cues for safe hand placement, stand pivot from bed to bsc to recliner toward L side - multiple cues to stand straight and push into walker to assist in relieving weight on R LE.  Ambulation/Gait                Stairs            Wheelchair Mobility    Modified Rankin (Stroke Patients Only)       Balance Overall balance assessment: Needs assistance Sitting-balance support: No upper extremity supported Sitting balance-Leahy Scale: Fair     Standing balance support: Bilateral upper extremity supported Standing balance-Leahy Scale: Poor Standing balance comment: Requiring assist and multiple cues to stand upright                             Pertinent Vitals/Pain Pain Assessment: Faces Faces Pain Scale: Hurts even more Pain Location: R LE with movement Pain Descriptors / Indicators: Throbbing;Sore Pain Intervention(s): Limited activity within patient's tolerance;Monitored during session;Repositioned    Home Living Family/patient expects to be discharged to:: Private residence                      Prior Function     Gait / Transfers Assistance Needed: reports she uses no AD  for mobility; ambulates short community distances     Comments: can do simple meal prep, but daughter assist mostly with home mgt; does work out videos daily with daughter     Journalist, newspaper        Extremity/Trunk Assessment   Upper Extremity Assessment Upper Extremity Assessment: Defer to OT evaluation    Lower Extremity Assessment Lower Extremity Assessment: LLE deficits/detail;RLE deficits/detail RLE Deficits / Details: ROM: ankle - tolerates gentle ROM, unable to get neutral DF due to pain, knee ~10 to 70 degrees , hip WFL;  MMT: ankle and knee 1/5, hip 2/5 LLE  Deficits / Details: WFL    Cervical / Trunk Assessment Cervical / Trunk Assessment: Kyphotic  Communication      Cognition Arousal/Alertness: Awake/alert Behavior During Therapy: WFL for tasks assessed/performed Overall Cognitive Status: Within Functional Limits for tasks assessed                                        General Comments General comments (skin integrity, edema, etc.): Once pt in chair, she became more lethargic, less talkative/responsive, and closing eyes.  HR 70's , O2 sats 90% on RA, and BP as follow 102/26 initially, 114/39 repeat, and 119/40 after reclined more.  RN present and monitoring.  Pt becoming more alert once reclined but not back to baseline.  Noted BP's have been soft, RN reports did just give pain about 30 mins ago.    Exercises     Assessment/Plan    PT Assessment Patient needs continued PT services  PT Problem List Decreased strength;Decreased mobility;Decreased safety awareness;Decreased activity tolerance;Decreased balance;Decreased range of motion;Decreased knowledge of precautions;Cardiopulmonary status limiting activity;Decreased knowledge of use of DME;Pain       PT Treatment Interventions DME instruction;Therapeutic activities;Therapeutic exercise;Patient/family education;Balance training;Functional mobility training;Modalities    PT Goals (Current goals can be found in the Care Plan section)  Acute Rehab PT Goals Patient Stated Goal: return home PT Goal Formulation: With patient Time For Goal Achievement: 12/19/20 Potential to Achieve Goals: Good    Frequency Min 3X/week   Barriers to discharge Inaccessible home environment      Co-evaluation PT/OT/SLP Co-Evaluation/Treatment: Yes Reason for Co-Treatment: Complexity of the patient's impairments (multi-system involvement);For patient/therapist safety PT goals addressed during session: Mobility/safety with mobility OT goals addressed during session: ADL's and  self-care       AM-PAC PT "6 Clicks" Mobility  Outcome Measure Help needed turning from your back to your side while in a flat bed without using bedrails?: A Lot Help needed moving from lying on your back to sitting on the side of a flat bed without using bedrails?: A Lot Help needed moving to and from a bed to a chair (including a wheelchair)?: A Lot Help needed standing up from a chair using your arms (e.g., wheelchair or bedside chair)?: A Lot Help needed to walk in hospital room?: Total Help needed climbing 3-5 steps with a railing? : Total 6 Click Score: 10    End of Session Equipment Utilized During Treatment: Gait belt Activity Tolerance: Patient tolerated treatment well Patient left: with chair alarm set;in chair;with call bell/phone within reach Nurse Communication: Mobility status PT Visit Diagnosis: Unsteadiness on feet (R26.81);Muscle weakness (generalized) (M62.81)    Time: 7356-7014 PT Time Calculation (min) (ACUTE ONLY): 47 min   Charges:   PT Evaluation $PT Eval Moderate Complexity: 1 Mod PT Treatments $Therapeutic Activity: 8-22  mins        Abran Richard, PT Acute Rehab Services Pager [phone] [facility] 12/05/2020, 4:03 PM

## 2020-12-05 NOTE — Progress Notes (Signed)
Inpatient Diabetes Program Recommendations  AACE/ADA: New Consensus Statement on Inpatient Glycemic Control (2015)  Target Ranges:  Prepandial:   less than 140 mg/dL      Peak postprandial:   less than 180 mg/dL (1-2 hours)      Critically ill patients:  140 - 180 mg/dL   Lab Results  Component Value Date   GLUCAP 252 (H) 12/05/2020   HGBA1C 7.3 (H) 12/02/2020    Review of Glycemic Control Results for Jennifer Bailey, Jennifer Bailey (MRN [id number]) as of 12/05/2020 13:24  Ref. Range 12/04/2020 12:04 12/04/2020 16:53 12/04/2020 20:53 12/05/2020 06:21 12/05/2020 11:29  Glucose-Capillary Latest Ref Range: 70 - 99 mg/dL 130 (H) 308 (H) 218 (H) 317 (H) 252 (H)   Diabetes history: Type 2 Dm Outpatient Diabetes medications: Novolog 3-5 units TID, Tresiba 11 units QD Current orders for Inpatient glycemic control: Novolog 0-9 units TID, Lantus 5 units QD  Inpatient Diabetes Program Recommendations:   -Increase QD dose of Lantus to 10 units QD (patient's home dose).   Secure chat sent to Dr. Sabino Gasser.  Thank you, Nani Gasser. Hanks, RN, MSN, CDE  Diabetes Coordinator Inpatient Glycemic Control Team Team Pager [phone] (8am-5pm) 12/05/2020 1:25 PM

## 2020-12-05 NOTE — Assessment & Plan Note (Addendum)
-   Hgb down to 7.4 g/dL on 3/3 with hypotension; likely some ABLA from surgery - s/p 1 unit PRBC on 3/3 -Hemoglobin has remained stable

## 2020-12-06 DIAGNOSIS — D62 Acute posthemorrhagic anemia: Secondary | ICD-10-CM | POA: Diagnosis not present

## 2020-12-06 DIAGNOSIS — S82201A Unspecified fracture of shaft of right tibia, initial encounter for closed fracture: Secondary | ICD-10-CM | POA: Diagnosis not present

## 2020-12-06 DIAGNOSIS — S82401A Unspecified fracture of shaft of right fibula, initial encounter for closed fracture: Secondary | ICD-10-CM | POA: Diagnosis not present

## 2020-12-06 LAB — BASIC METABOLIC PANEL
Anion gap: 9 (ref 5–15)
BUN: 12 mg/dL (ref 8–23)
CO2: 25 mmol/L (ref 22–32)
Calcium: 8.2 mg/dL — ABNORMAL LOW (ref 8.9–10.3)
Chloride: 101 mmol/L (ref 98–111)
Creatinine, Ser: 0.79 mg/dL (ref 0.44–1.00)
GFR, Estimated: >60 mL/min (ref >60)
Glucose, Bld: 269 mg/dL — ABNORMAL HIGH (ref 70–99)
Potassium: 4.1 mmol/L (ref 3.5–5.1)
Sodium: 135 mmol/L (ref 135–145)

## 2020-12-06 LAB — BPAM RBC
Blood Product Expiration Date: 202204022359
ISSUE DATE / TIME: 202203031202
Unit Type and Rh: 5100

## 2020-12-06 LAB — CBC
HCT: 28.7 % — ABNORMAL LOW (ref 36.0–46.0)
Hemoglobin: 9.8 g/dL — ABNORMAL LOW (ref 12.0–15.0)
MCH: 30.7 pg (ref 26.0–34.0)
MCHC: 34.1 g/dL (ref 30.0–36.0)
MCV: 90 fL (ref 80.0–100.0)
Platelets: 218 10*3/uL (ref 150–400)
RBC: 3.19 MIL/uL — ABNORMAL LOW (ref 3.87–5.11)
RDW: 13.5 % (ref 11.5–15.5)
WBC: 9 10*3/uL (ref 4.0–10.5)
nRBC: 0 % (ref 0.0–0.2)

## 2020-12-06 LAB — TYPE AND SCREEN
ABO/RH(D): O POS
Antibody Screen: NEGATIVE
Unit division: 0

## 2020-12-06 LAB — GLUCOSE, CAPILLARY
Glucose-Capillary: 292 mg/dL — ABNORMAL HIGH (ref 70–99)
Glucose-Capillary: 232 mg/dL — ABNORMAL HIGH (ref 70–99)
Glucose-Capillary: 206 mg/dL — ABNORMAL HIGH (ref 70–99)
Glucose-Capillary: 227 mg/dL — ABNORMAL HIGH (ref 70–99)

## 2020-12-06 LAB — MAGNESIUM: Magnesium: 1.9 mg/dL (ref 1.7–2.4)

## 2020-12-06 MED ORDER — HYDROCODONE-ACETAMINOPHEN 7.5-325 MG PO TABS: 1.0000 | ORAL_TABLET | Freq: Four times a day (QID) | ORAL | 0 refills | Status: DC | PRN

## 2020-12-06 MED ORDER — SENNOSIDES-DOCUSATE SODIUM 8.6-50 MG PO TABS
1.0000 | ORAL_TABLET | Freq: Two times a day (BID) | ORAL | Status: DC
  Administered 2020-12-06 – 2020-12-09 (×8): 1 via ORAL
  Filled 2020-12-06 (×9): qty 1

## 2020-12-06 MED ORDER — ENOXAPARIN SODIUM 40 MG/0.4ML ~~LOC~~ SOLN: 40.0000 mg | SUBCUTANEOUS | 0 refills | Status: DC

## 2020-12-06 NOTE — NC FL2 (Signed)
[place] LEVEL OF CARE SCREENING TOOL     IDENTIFICATION  Patient Name: Jennifer Bailey Birthdate: 1935-05-25 Sex: female Admission Date (Current Location): 12/02/2020  [facility] and [place] Number:  Herbalist and Address:  [facility], [place] [address], [place], [place] [address]      Provider Number: [id number]  Attending Physician Name and Address:  Dwyane Dee, MD  Relative Name and Phone Number:       Current Level of Care: Hospital Recommended Level of Care: [place] Prior Approval Number:    Date Approved/Denied:   PASRR Number: [id number]  Discharge Plan: Home    Current Diagnoses: Patient Active Problem List   Diagnosis Date Noted  . Acute blood loss anemia 12/05/2020  . Glaucoma 12/04/2020  . Tibia/fibula fracture 12/02/2020  . Fall at home, initial encounter 12/02/2020  . Anemia 12/13/2019  . Widowed 07/09/2019  . Family history of diabetes mellitus in brother 07/09/2019  . Tremulousness of voice 07/09/2019  . Family history of colon cancer- in her brother 07/09/2019  . Acquired autoimmune hypothyroidism 08/20/2014  . Type II diabetes mellitus, uncontrolled ([place]) 08/20/2014  . Osteoporosis 11/20/2013  . Diabetic polyneuropathy ([place]) 08/14/2013  . Hypertension associated with diabetes ([place]) 06/14/2013  . Hyperlipidemia associated with type 2 diabetes mellitus ([place]) 06/14/2013    Orientation RESPIRATION BLADDER Height & Weight     Time,Self,Situation,Place  Normal Continent Weight: 128 lb (58.1 kg) Height:  4\' 10"  (147.3 cm)  BEHAVIORAL SYMPTOMS/MOOD NEUROLOGICAL BOWEL NUTRITION STATUS        Diet (See discharge summary)  AMBULATORY STATUS COMMUNICATION OF NEEDS Skin   Extensive Assist Verbally Normal,Surgical wounds                       Personal Care Assistance Level of Assistance  Feeding,Bathing,Dressing Bathing Assistance: Maximum assistance Feeding assistance:  Independent Dressing Assistance: Maximum assistance     Functional Limitations Info  Hearing,Sight,Speech Sight Info: Adequate   Speech Info: Adequate    SPECIAL CARE FACTORS FREQUENCY  OT (By licensed OT),PT (By licensed PT)     PT Frequency: 5x a week OT Frequency: 5x a week            Contractures Contractures Info: Not present    Additional Factors Info  Code Status,Allergies Code Status Info: Full Allergies Info: Macrodantin (Nitrofurantoin Macrocrystal)   Prednisone   Penicillins           Current Medications (12/06/2020):  This is the current hospital active medication list Current Facility-Administered Medications  Medication Dose Route Frequency Provider Last Rate Last Admin  . acetaminophen (TYLENOL) tablet 650 mg  650 mg Oral Q6H PRN Delray Alt, PA-C   650 mg at 12/06/20 1414  . brimonidine (ALPHAGAN) 0.15 % ophthalmic solution 1 drop  1 drop Left Eye BID Delray Alt, PA-C   1 drop at 12/06/20 0932  . cycloSPORINE (RESTASIS) 0.05 % ophthalmic emulsion 1 drop  1 drop Both Eyes BID Delray Alt, PA-C   1 drop at 12/06/20 3500  . dorzolamide-timolol (COSOPT) 22.3-6.8 MG/ML ophthalmic solution 1 drop  1 drop Left Eye BID Delray Alt, PA-C   1 drop at 12/06/20 0932  . enoxaparin (LOVENOX) injection 40 mg  40 mg Subcutaneous Q24H Patrecia Pace A, PA-C   40 mg at 12/06/20 0126  . gabapentin (NEURONTIN) capsule 300 mg  300 mg Oral QHS Delray Alt, PA-C  300 mg at 12/05/20 2034  . hydrALAZINE (APRESOLINE) tablet 25 mg  25 mg Oral Q4H PRN Delray Alt, PA-C      . HYDROcodone-acetaminophen (NORCO) 7.5-325 MG per tablet 1 tablet  1 tablet Oral Q6H PRN Delray Alt, PA-C   1 tablet at 12/05/20 2034  . insulin aspart (novoLOG) injection 0-9 Units  0-9 Units Subcutaneous TID WC Delray Alt, PA-C   3 Units at 12/06/20 1221  . insulin glargine (LANTUS) injection 10 Units  10 Units Subcutaneous Daily Dwyane Dee, MD   10 Units at 12/06/20 0935  .  labetalol (NORMODYNE) injection 10 mg  10 mg Intravenous Q4H PRN Delray Alt, PA-C      . levothyroxine (SYNTHROID) tablet 75 mcg  75 mcg Oral Q0600 Delray Alt, PA-C   75 mcg at 12/06/20 2831  . morphine 2 MG/ML injection 0.5-1 mg  0.5-1 mg Intravenous Q4H PRN Delray Alt, PA-C      . ondansetron [facility]) injection 4 mg  4 mg Intravenous Q8H PRN Patrecia Pace A, PA-C   4 mg at 12/04/20 1355  . oxyCODONE (Oxy IR/ROXICODONE) immediate release tablet 5 mg  5 mg Oral Q6H PRN Patrecia Pace A, PA-C   5 mg at 12/05/20 1000  . polyethylene glycol (MIRALAX / GLYCOLAX) packet 17 g  17 g Oral Daily PRN Patrecia Pace A, PA-C      . polyvinyl alcohol (LIQUIFILM TEARS) 1.4 % ophthalmic solution 1 drop  1 drop Both Eyes PRN Delray Alt, PA-C   1 drop at 12/06/20 0933  . senna-docusate (Senokot-S) tablet 1 tablet  1 tablet Oral BID Dwyane Dee, MD   1 tablet at 12/06/20 1220  . sodium chloride flush (NS) 0.9 % injection 3 mL  3 mL Intravenous Q12H Delray Alt, PA-C   3 mL at 12/05/20 2037     Discharge Medications: Please see discharge summary for a list of discharge medications.  Relevant Imaging Results:  Relevant Lab Results:   Additional Information SSN: [id number]  Emeterio Reeve, [place]

## 2020-12-06 NOTE — Progress Notes (Signed)
Occupational Therapy Treatment Patient Details Name: Jennifer Bailey MRN: [id number] DOB: Dec 14, 1934 Today's Date: 12/06/2020    History of present illness Pt is 86 yo female admitted on 12/02/20 aftr a fall down steps and found to have R proximal tibial fracture and fibular neck fracture.  She is s/p ORIF R tibial plateau fx on 12/04/20.  Pt with PMH including DM, neuropathy, HTN, HLD, osteoporosis, and hypothyroidism.   OT comments  Pt making progress with functional goals. Pt in bed upon arrival and agreeable to OOB activity. Max - mod A for sit - stand from EOB to RW and SPT to [facility] for toileting tasks total - mod A. OT will continue to follow acutely to maximize level of function and safety  Follow Up Recommendations  SNF    Equipment Recommendations  3 in 1 bedside commode;Other (comment) (RW, TBD at next venue of care)    Recommendations for Other Services      Precautions / Restrictions Precautions Precautions: Fall Restrictions Weight Bearing Restrictions: Yes RLE Weight Bearing: Weight bearing as tolerated       Mobility Bed Mobility Overal bed mobility: Needs Assistance Bed Mobility: Supine to Sit     Supine to sit: Mod assist     General bed mobility comments: Pt requiring min A for R LE to EOB and was able to scoot toward EOB with cues for bed rail use; did need mod A to complete raising trunk and scooting to EOB    Transfers Overall transfer level: Needs assistance Equipment used: Rolling walker (2 wheeled) Transfers: Sit to/from [facility] Sit to Stand: Max assist;Mod assist Stand pivot transfers: Max assist;Mod assist       General transfer comment: Performed sit to stand from bed and BSC - multiple cues for safe hand placement, stand pivot from bed to bsc to recliner toward L side - multiple cues to stand straight and push into walker to assist in relieving weight on R LE.    Balance Overall balance assessment: Needs  assistance Sitting-balance support: No upper extremity supported Sitting balance-Leahy Scale: Fair     Standing balance support: Bilateral upper extremity supported;During functional activity Standing balance-Leahy Scale: Poor                             ADL either performed or assessed with clinical judgement   ADL Overall ADL's : Needs assistance/impaired     Grooming: Wash/dry hands;Wash/dry face;Min guard;Sitting           Upper Body Dressing : Min guard;Sitting Upper Body Dressing Details (indicate cue type and reason): donned clean gown seated in recliner     Toilet Transfer: Maximal assistance;Moderate assistance;Stand-pivot;RW;Cueing for safety;Cueing for sequencing   Toileting- Clothing Manipulation and Hygiene: Moderate assistance;Total assistance;Sitting/lateral lean Toileting - Clothing Manipulation Details (indicate cue type and reason): seated on BSC for anterior hygiene mod A, total A clothing mgt     Functional mobility during ADLs: Maximal assistance;Moderate assistance;Rolling walker;Cueing for safety       Vision Baseline Vision/History: Wears glasses Wears Glasses: At all times Patient Visual Report: No change from baseline     Perception     Praxis      Cognition Arousal/Alertness: Awake/alert Behavior During Therapy: WFL for tasks assessed/performed Overall Cognitive Status: Within Functional Limits for tasks assessed  Exercises     Shoulder Instructions       General Comments      Pertinent Vitals/ Pain       Pain Assessment: Faces Faces Pain Scale: Hurts even more Pain Location: R LE with movement Pain Descriptors / Indicators: Throbbing;Sore Pain Intervention(s): Monitored during session;Premedicated before session;Limited activity within patient's tolerance;Repositioned  Home Living                                          Prior  Functioning/Environment              Frequency  Min 2X/week        Progress Toward Goals  OT Goals(current goals can now be found in the care plan section)  Progress towards OT goals: Progressing toward goals  Acute Rehab OT Goals Patient Stated Goal: return home  Plan Discharge plan remains appropriate;Frequency remains appropriate    Co-evaluation                 AM-PAC OT "6 Clicks" Daily Activity     Outcome Measure   Help from another person eating meals?: None Help from another person taking care of personal grooming?: A Little Help from another person toileting, which includes using toliet, bedpan, or urinal?: A Lot Help from another person bathing (including washing, rinsing, drying)?: A Lot Help from another person to put on and taking off regular upper body clothing?: A Little Help from another person to put on and taking off regular lower body clothing?: Total 6 Click Score: 15    End of Session Equipment Utilized During Treatment: Gait belt;Rolling walker;Other (comment) (BSC)  OT Visit Diagnosis: Unsteadiness on feet (R26.81);Other abnormalities of gait and mobility (R26.89);Muscle weakness (generalized) (M62.81);History of falling (Z91.81);Pain Pain - Right/Left: Right Pain - part of body: Leg   Activity Tolerance Patient tolerated treatment well   Patient Left in chair;with call bell/phone within reach;with chair alarm set   Nurse Communication          Time: 9030-0923 OT Time Calculation (min): 36 min  Charges: OT General Charges $OT Visit: 1 Visit OT Treatments $Self Care/Home Management : 8-22 mins $Therapeutic Activity: 8-22 mins     Britt Bottom 12/06/2020, 3:11 PM

## 2020-12-06 NOTE — TOC Initial Note (Signed)
Transition of Care [facility]) - Initial/Assessment Note    Patient Details  Name: Jennifer Bailey MRN: [id number] Date of Birth: 04/09/1935  Transition of Care [facility]) CM/SW Contact:    Emeterio Reeve, [place] Phone Number: 12/06/2020, 2:37 PM  Clinical Narrative:                  CSW spoke to pt at bedside. Pt reports PTA she was living at home with her two daughters. Pt reports she was completely independent with mobility and ADLS. Pt reports her daughters both work during the day. Pt reports she is covid vaccinated.   CSW reviewed pt/ot reccs of SNF. Pt reports she is fine with that and [facility] is her first choice. Pt gave CSW permission to fax out to other SNFs as back up. CSW discussed medicare.gov rating list with pt.   TOC will follow.   Expected Discharge Plan: Skilled Nursing Facility Barriers to Discharge: Continued Medical Work up   Patient Goals and CMS Choice Patient states their goals for this hospitalization and ongoing recovery are:: get strong enout to go back home after rehab. CMS Medicare.gov Compare Post Acute Care list provided to:: Patient Choice offered to / list presented to : Patient  Expected Discharge Plan and Services Expected Discharge Plan: [place]       Living arrangements for the past 2 months: Single Family Home                                      Prior Living Arrangements/Services Living arrangements for the past 2 months: Single Family Home Lives with:: Adult Children   Do you feel safe going back to the place where you live?: Yes      Need for Family Participation in Patient Care: Yes (Comment) Care giver support system in place?: Yes (comment) Current home services: DME Criminal Activity/Legal Involvement Pertinent to Current Situation/Hospitalization: No - Comment as needed  Activities of Daily Living Home Assistive Devices/Equipment: CBG Meter,Eyeglasses,Blood pressure cuff ADL Screening (condition at time of  admission) Patient's cognitive ability adequate to safely complete daily activities?: Yes Is the patient deaf or have difficulty hearing?: No Does the patient have difficulty seeing, even when wearing glasses/contacts?: Yes Does the patient have difficulty concentrating, remembering, or making decisions?: No Patient able to express need for assistance with ADLs?: Yes Does the patient have difficulty dressing or bathing?: Yes Independently performs ADLs?: No Communication: Independent Dressing (OT): Needs assistance Is this a change from baseline?: Change from baseline, expected to last >3 days Grooming: Independent Feeding: Needs assistance Is this a change from baseline?: Change from baseline, expected to last >3 days Bathing: Needs assistance Is this a change from baseline?: Change from baseline, expected to last >3 days Toileting: Dependent Is this a change from baseline?: Change from baseline, expected to last >3days In/Out Bed: Dependent Is this a change from baseline?: Change from baseline, expected to last >3 days Walks in Home: Dependent Is this a change from baseline?: Change from baseline, expected to last >3 days Does the patient have difficulty walking or climbing stairs?: Yes (secondary to right leg injury) Weakness of Legs: Right Weakness of Arms/Hands: None  Permission Sought/Granted Permission sought to share information with : Facility Art therapist granted to share information with : Yes, Verbal Permission Granted     Permission granted to share info w AGENCY: SNF  Emotional Assessment Appearance:: Appears stated age Attitude/Demeanor/Rapport: Engaged Affect (typically observed): Appropriate,Accepting Orientation: : Oriented to Self,Oriented to Place,Oriented to  Time,Oriented to Situation Alcohol / Substance Use: Not Applicable Psych Involvement: No (comment)  Admission diagnosis:  Tibia/fibula fracture [S82.209A, S82.409A] Fall  due to slipping on ice or snow, initial encounter [W00.9XXA] Closed fracture of proximal end of right fibula, unspecified fracture morphology, initial encounter [S82.831A] Closed fracture of proximal end of right tibia, unspecified fracture morphology, initial encounter [S82.101A] Patient Active Problem List   Diagnosis Date Noted  . Acute blood loss anemia 12/05/2020  . Glaucoma 12/04/2020  . Tibia/fibula fracture 12/02/2020  . Fall at home, initial encounter 12/02/2020  . Anemia 12/13/2019  . Widowed 07/09/2019  . Family history of diabetes mellitus in brother 07/09/2019  . Tremulousness of voice 07/09/2019  . Family history of colon cancer- in her brother 07/09/2019  . Acquired autoimmune hypothyroidism 08/20/2014  . Type II diabetes mellitus, uncontrolled ([place]) 08/20/2014  . Osteoporosis 11/20/2013  . Diabetic polyneuropathy ([place]) 08/14/2013  . Hypertension associated with diabetes ([place]) 06/14/2013  . Hyperlipidemia associated with type 2 diabetes mellitus ([place]) 06/14/2013   PCP:  System, Provider Not In Pharmacy:   [place], [place] [address] Phone: [phone] Fax: [phone]  [facility], [place] [address] [place] [address] Phone: [phone] Fax: [place] [address] [place]), [place] - [address] [place] ([place]) [place] [address] Phone: [phone] Fax: [phone]     Social Determinants of Health (SDOH) Interventions    Readmission Risk Interventions No flowsheet data found.  Emeterio Reeve, Latanya Presser, [place] Social Worker [phone]

## 2020-12-06 NOTE — Plan of Care (Signed)

## 2020-12-06 NOTE — Discharge Instructions (Signed)
Orthopaedic Trauma Service Discharge Instructions   General Discharge Instructions  WEIGHT BEARING STATUS: Weightbearing RLE for transfers. No walker ambulation on RLE  RANGE OF MOTION/ACTIVITY: Ok for ROM of extremity as tolerated  Wound Care: Incisions can be left open to air if there is no drainage. If incision continues to have drainage, follow wound care instructions below. Okay to shower if no drainage from incisions.  DVT/PE prophylaxis: Lovenox  Diet: as you were eating previously.  Can use over the counter stool softeners and bowel preparations, such as Miralax, to help with bowel movements.  Narcotics can be constipating.  Be sure to drink plenty of fluids  PAIN MEDICATION USE AND EXPECTATIONS  You have likely been given narcotic medications to help control your pain.  After a traumatic event that results in an fracture (broken bone) with or without surgery, it is ok to use narcotic pain medications to help control one's pain.  We understand that everyone responds to pain differently and each individual patient will be evaluated on a regular basis for the continued need for narcotic medications. Ideally, narcotic medication use should last no more than 6-8 weeks (coinciding with fracture healing).   As a patient it is your responsibility as well to monitor narcotic medication use and report the amount and frequency you use these medications when you come to your office visit.   We would also advise that if you are using narcotic medications, you should take a dose prior to therapy to maximize you participation.  IF YOU ARE ON NARCOTIC MEDICATIONS IT IS NOT PERMISSIBLE TO OPERATE A MOTOR VEHICLE (MOTORCYCLE/CAR/TRUCK/MOPED) OR HEAVY MACHINERY DO NOT MIX NARCOTICS WITH OTHER CNS (CENTRAL NERVOUS SYSTEM) DEPRESSANTS SUCH AS ALCOHOL   STOP SMOKING OR USING NICOTINE PRODUCTS!!!!  As discussed nicotine severely impairs your body's ability to heal surgical and traumatic wounds but  also impairs bone healing.  Wounds and bone heal by forming microscopic blood vessels (angiogenesis) and nicotine is a vasoconstrictor (essentially, shrinks blood vessels).  Therefore, if vasoconstriction occurs to these microscopic blood vessels they essentially disappear and are unable to deliver necessary nutrients to the healing tissue.  This is one modifiable factor that you can do to dramatically increase your chances of healing your injury.    (This means no smoking, no nicotine gum, patches, etc)  DO NOT USE NONSTEROIDAL ANTI-INFLAMMATORY DRUGS (NSAID'S)  Using products such as Advil (ibuprofen), Aleve (naproxen), Motrin (ibuprofen) for additional pain control during fracture healing can delay and/or prevent the healing response.  If you would like to take over the counter (OTC) medication, Tylenol (acetaminophen) is ok.  However, some narcotic medications that are given for pain control contain acetaminophen as well. Therefore, you should not exceed more than 4000 mg of tylenol in a day if you do not have liver disease.  Also note that there are may OTC medicines, such as cold medicines and allergy medicines that my contain tylenol as well.  If you have any questions about medications and/or interactions please ask your doctor/PA or your pharmacist.      ICE AND ELEVATE INJURED/OPERATIVE EXTREMITY  Using ice and elevating the injured extremity above your heart can help with swelling and pain control.  Icing in a pulsatile fashion, such as 20 minutes on and 20 minutes off, can be followed.    Do not place ice directly on skin. Make sure there is a barrier between to skin and the ice pack.    Using frozen items such as frozen  peas works well as the conform nicely to the are that needs to be iced.  USE AN ACE WRAP OR TED HOSE FOR SWELLING CONTROL  In addition to icing and elevation, Ace wraps or TED hose are used to help limit and resolve swelling.  It is recommended to use Ace wraps or TED hose  until you are informed to stop.    When using Ace Wraps start the wrapping distally (farthest away from the body) and wrap proximally (closer to the body)   Example: If you had surgery on your leg or thing and you do not have a splint on, start the ace wrap at the toes and work your way up to the thigh        If you had surgery on your upper extremity and do not have a splint on, start the ace wrap at your fingers and work your way up to the upper arm   [place]: [id number]   VISIT OUR WEBSITE FOR ADDITIONAL INFORMATION: [email or web address]     Discharge Wound Care Instructions  Do NOT apply any ointments, solutions or lotions to pin sites or surgical wounds.  These prevent needed drainage and even though solutions like hydrogen peroxide kill bacteria, they also damage cells lining the pin sites that help fight infection.  Applying lotions or ointments can keep the wounds moist and can cause them to breakdown and open up as well. This can increase the risk for infection. When in doubt call the office.  If any drainage is noted, use one layer of adaptic, then gauze, Kerlix, and an ace wrap.  Once the incision is completely dry and without drainage, it may be left open to air out.  Showering may begin 36-48 hours later.  Cleaning gently with soap and water.

## 2020-12-06 NOTE — Progress Notes (Signed)
Orthopaedic Trauma Progress Note  SUBJECTIVE: Doing fairly well this morning, reports mild pain about operative site which she notes is well controlled on current regimen. No chest pain. No SOB. No nausea/vomiting. No other complaints. Worked with therapies yesterday, noted this was difficult.  OBJECTIVE:  Vitals:   12/06/20 0408 12/06/20 0830  BP: (!) 118/96 (!) 138/45  Pulse: 84 87  Resp: 16 17  Temp: 98.1 F (36.7 C) 98.8 F (37.1 C)  SpO2: 95% 95%    General: Sitting in bed, no acute distress. Eating breakfast Respiratory: No increased work of breathing.  Right lower extremity: Dressings removed, incisions with small amount of serosanguinous drainage. New dressing applied.  Tenderness with palpation of the knee as well as down to the lower leg.  Tolerates very gentle ankle range of motion but does endorse some discomfort with this. Compartments compressible. Able to wiggle toes.  Foot warm and well-perfused. +DP pulse  IMAGING: Stable post op imaging.   LABS:  Results for orders placed or performed during the hospital encounter of 12/02/20 (from the past 24 hour(s))  Glucose, capillary     Status: Abnormal   Collection Time: 12/05/20 11:29 AM  Result Value Ref Range   Glucose-Capillary 252 (H) 70 - 99 mg/dL  Prepare RBC (crossmatch)     Status: None   Collection Time: 12/05/20 11:44 AM  Result Value Ref Range   Order Confirmation      ORDER PROCESSED BY BLOOD BANK Performed at [facility], [address], [place], [place] [address]   Glucose, capillary     Status: Abnormal   Collection Time: 12/05/20  4:27 PM  Result Value Ref Range   Glucose-Capillary 220 (H) 70 - 99 mg/dL  Glucose, capillary     Status: Abnormal   Collection Time: 12/05/20 11:53 PM  Result Value Ref Range   Glucose-Capillary 255 (H) 70 - 99 mg/dL  Basic metabolic panel     Status: Abnormal   Collection Time: 12/06/20  6:32 AM  Result Value Ref Range   Sodium 135 135 - 145 mmol/L   Potassium  4.1 3.5 - 5.1 mmol/L   Chloride 101 98 - 111 mmol/L   CO2 25 22 - 32 mmol/L   Glucose, Bld 269 (H) 70 - 99 mg/dL   BUN 12 8 - 23 mg/dL   Creatinine, Ser 0.79 0.44 - 1.00 mg/dL   Calcium 8.2 (L) 8.9 - 10.3 mg/dL   GFR, Estimated >60 >60 mL/min   Anion gap 9 5 - 15  CBC     Status: Abnormal   Collection Time: 12/06/20  6:32 AM  Result Value Ref Range   WBC 9.0 4.0 - 10.5 K/uL   RBC 3.19 (L) 3.87 - 5.11 MIL/uL   Hemoglobin 9.8 (L) 12.0 - 15.0 g/dL   HCT 28.7 (L) 36.0 - 46.0 %   MCV 90.0 80.0 - 100.0 fL   MCH 30.7 26.0 - 34.0 pg   MCHC 34.1 30.0 - 36.0 g/dL   RDW 13.5 11.5 - 15.5 %   Platelets 218 150 - 400 K/uL   nRBC 0.0 0.0 - 0.2 %  Magnesium     Status: None   Collection Time: 12/06/20  6:32 AM  Result Value Ref Range   Magnesium 1.9 1.7 - 2.4 mg/dL  Glucose, capillary     Status: Abnormal   Collection Time: 12/06/20  8:05 AM  Result Value Ref Range   Glucose-Capillary 292 (H) 70 - 99 mg/dL  ASSESSMENT: Jennifer Bailey is a 86 y.o. female, 2 Days Post-Op s/p ORIF RIGHT TIBIAL PLATEAU  CV/Blood loss: Acute blood loss anemia, Hgb 9.8 this morning. Received 1 unit PRBCs 12/05/20  PLAN: Weightbearing: WB RLE for transfers only. No walker ambulation Incisional and dressing care:  Changed today, continue to change PRN with mepilex Showering: Hold off for now until no drainage form incisions Orthopedic device(s): None  Pain management:  1. Tylenol 650 mg q 6 hours PRN 2. Norco 7.5-325 mg 6 hours PRN moderate pain 3. Oxycodone 5 mg q 6 hours PRN severe pain 4. Neurontin 300 mg QHS 5. Morphine 0.5-1 mg q 4 hours PRN VTE prophylaxis: Ok to start lovenox from ortho standpoint, SCDs for now ID: Clindamycin 900 mg post op completed Foley/Lines:  No foley, KVO IVFs Impediments to Fracture Healing: Last drawn Vit D level 60, currently on Vit D2 50,000 units q7 days. No additional supplementation needed Dispo: Therapies as tolerated, PT/OT recommending SNF. Continue to monitor  CBC s/p transfusion on 12/05/20. Follow - up plan: 2 weeks after hospital discharge for repeat x-rays and wound check  Contact information:  Katha Hamming MD, Patrecia Pace PA-C. After hours and holidays please check [email or web address] for group call information for [facility]   Oniyah Rohe A. Ricci Barker, PA-C [phone] (office) [email or web address]

## 2020-12-06 NOTE — Progress Notes (Signed)
PROGRESS NOTE    Jennifer Bailey   MGQ:[id number]  DOB: [birth year]  DOA: 12/02/2020     4  PCP: System, Provider Not In  CC: fall at home  Hospital Course: Jennifer Bailey is an 86 yo female with PMH DMII, neuropathy, HTN, HLD, osteoporosis, hypothyroidism who presented to the hospital after a mechanical fall at home, tripping down 3 stairs on her deck.  She struck her right knee and leg on the ground.  No reports of head injury or loss of consciousness. On evaluation with x-ray of right lower extremity she was found to have a proximal tibial fracture and fibular neck fracture.   She underwent ORIF on 12/04/20. Post op rec's are for weight bearing for transfers only.    Interval History:  No events overnight. Pressure is better this am and Hgb up to 9.8 g/dL.  Having some pain in her RLE but controlled with pain medication. Was able to pivot to chair yesterday she says.  She understands rec for SNF and [facility] is her first choice she says.   ROS: Constitutional: negative for chills, fatigue and fevers, Respiratory: negative for cough, Cardiovascular: negative for chest pain and Gastrointestinal: negative for abdominal pain  Assessment & Plan:  * Tibia/fibula fracture - s/p mechanical fall at home - continue pain control - s/p ORIF on 3/2 - per ortho: weightbearing for transfers only; no walker ambulation - PT: SNF recommended   Type II diabetes mellitus, uncontrolled ([place]) - continue lantus and SSI - continue CBG monitoring   Acute blood loss anemia - Hgb down to 7.4 g/dL on 3/3 with hypotension; likely some ABLA from surgery - s/p 1 unit PRBC on 3/3: Repeat Hgb today is 9.8g/dL and pressure is improved  Glaucoma - continue home eye drops  Hyperlipidemia associated with type 2 diabetes mellitus ([place]) - holding statin for now  Hypertension associated with diabetes ([place]) - continue current regimen   Old records reviewed in assessment of this  patient  Antimicrobials: n/a  DVT prophylaxis: enoxaparin (LOVENOX) injection 40 mg Start: 12/06/20 0000   Code Status:   Code Status: Full Code Family Communication: daughter  Disposition Plan: Status is: Inpatient  Remains inpatient appropriate because:Ongoing diagnostic testing needed not appropriate for outpatient work up, IV treatments appropriate due to intensity of illness or inability to take PO and Inpatient level of care appropriate due to severity of illness   Dispo: The patient is from: Home              Anticipated d/c is to: SNF              Patient currently is not medically stable to d/c.   Difficult to place patient No  Risk of unplanned readmission score: Unplanned Admission- Pilot do not use: 15.74   Objective: Blood pressure (!) 150/48, pulse 77, temperature 98.5 F (36.9 C), temperature source Oral, resp. rate 17, height 4\' 10"  (1.473 m), weight 58.1 kg, SpO2 99 %.  Examination: General appearance: alert, cooperative and no distress Head: Normocephalic, without obvious abnormality, atraumatic Eyes: EOMI Lungs: clear to auscultation bilaterally Heart: regular rate and rhythm and S1, S2 normal Abdomen: normal findings: bowel sounds normal and soft, non-tender Extremities: RLE wrapped in bandage postop; sensation intact and soft compartments Skin: mobility and turgor normal Neurologic: Grossly normal  Consultants:   Ortho  Procedures:     Data Reviewed: I have personally reviewed following labs and imaging studies Results for orders placed or performed during the hospital  encounter of 12/02/20 (from the past 24 hour(s))  Glucose, capillary     Status: Abnormal   Collection Time: 12/05/20  4:27 PM  Result Value Ref Range   Glucose-Capillary 220 (H) 70 - 99 mg/dL  Glucose, capillary     Status: Abnormal   Collection Time: 12/05/20 11:53 PM  Result Value Ref Range   Glucose-Capillary 255 (H) 70 - 99 mg/dL  Basic metabolic panel     Status:  Abnormal   Collection Time: 12/06/20  6:32 AM  Result Value Ref Range   Sodium 135 135 - 145 mmol/L   Potassium 4.1 3.5 - 5.1 mmol/L   Chloride 101 98 - 111 mmol/L   CO2 25 22 - 32 mmol/L   Glucose, Bld 269 (H) 70 - 99 mg/dL   BUN 12 8 - 23 mg/dL   Creatinine, Ser 0.79 0.44 - 1.00 mg/dL   Calcium 8.2 (L) 8.9 - 10.3 mg/dL   GFR, Estimated >60 >60 mL/min   Anion gap 9 5 - 15  CBC     Status: Abnormal   Collection Time: 12/06/20  6:32 AM  Result Value Ref Range   WBC 9.0 4.0 - 10.5 K/uL   RBC 3.19 (L) 3.87 - 5.11 MIL/uL   Hemoglobin 9.8 (L) 12.0 - 15.0 g/dL   HCT 28.7 (L) 36.0 - 46.0 %   MCV 90.0 80.0 - 100.0 fL   MCH 30.7 26.0 - 34.0 pg   MCHC 34.1 30.0 - 36.0 g/dL   RDW 13.5 11.5 - 15.5 %   Platelets 218 150 - 400 K/uL   nRBC 0.0 0.0 - 0.2 %  Magnesium     Status: None   Collection Time: 12/06/20  6:32 AM  Result Value Ref Range   Magnesium 1.9 1.7 - 2.4 mg/dL  Glucose, capillary     Status: Abnormal   Collection Time: 12/06/20  8:05 AM  Result Value Ref Range   Glucose-Capillary 292 (H) 70 - 99 mg/dL  Glucose, capillary     Status: Abnormal   Collection Time: 12/06/20 11:35 AM  Result Value Ref Range   Glucose-Capillary 232 (H) 70 - 99 mg/dL    Recent Results (from the past 240 hour(s))  Resp Panel by RT-PCR (Flu A&B, Covid) Nasopharyngeal Swab     Status: None   Collection Time: 12/02/20 12:17 PM   Specimen: Nasopharyngeal Swab; Nasopharyngeal(NP) swabs in vial transport medium  Result Value Ref Range Status   SARS Coronavirus 2 by RT PCR NEGATIVE NEGATIVE Final    Comment: (NOTE) SARS-CoV-2 target nucleic acids are NOT DETECTED.  The SARS-CoV-2 RNA is generally detectable in upper respiratory specimens during the acute phase of infection. The lowest concentration of SARS-CoV-2 viral copies this assay can detect is 138 copies/mL. A negative result does not preclude SARS-Cov-2 infection and should not be used as the sole basis for treatment or other patient  management decisions. A negative result may occur with  improper specimen collection/handling, submission of specimen other than nasopharyngeal swab, presence of viral mutation(s) within the areas targeted by this assay, and inadequate number of viral copies(<138 copies/mL). A negative result must be combined with clinical observations, patient history, and epidemiological information. The expected result is Negative.  Fact Sheet for Patients:  EntrepreneurPulse.com.au  Fact Sheet for Healthcare Providers:  IncredibleEmployment.be  This test is no t yet approved or cleared by the [place] FDA and  has been authorized for detection and/or diagnosis of SARS-CoV-2 by FDA under an Emergency Use  Authorization (EUA). This EUA will remain  in effect (meaning this test can be used) for the duration of the COVID-19 declaration under Section 564(b)(1) of the Act, 21 U.S.C.section 360bbb-3(b)(1), unless the authorization is terminated  or revoked sooner.       Influenza A by PCR NEGATIVE NEGATIVE Final   Influenza B by PCR NEGATIVE NEGATIVE Final    Comment: (NOTE) The Xpert Xpress SARS-CoV-2/FLU/RSV plus assay is intended as an aid in the diagnosis of influenza from Nasopharyngeal swab specimens and should not be used as a sole basis for treatment. Nasal washings and aspirates are unacceptable for Xpert Xpress SARS-CoV-2/FLU/RSV testing.  Fact Sheet for Patients: EntrepreneurPulse.com.au  Fact Sheet for Healthcare Providers: IncredibleEmployment.be  This test is not yet approved or cleared by the [place] FDA and has been authorized for detection and/or diagnosis of SARS-CoV-2 by FDA under an Emergency Use Authorization (EUA). This EUA will remain in effect (meaning this test can be used) for the duration of the COVID-19 declaration under Section 564(b)(1) of the Act, 21 U.S.C. section 360bbb-3(b)(1),  unless the authorization is terminated or revoked.  Performed at [facility], [place] [address], [place], [place] [address]   Surgical pcr screen     Status: None   Collection Time: 12/02/20  8:10 PM   Specimen: Nasal Mucosa; Nasal Swab  Result Value Ref Range Status   MRSA, PCR NEGATIVE NEGATIVE Final   Staphylococcus aureus NEGATIVE NEGATIVE Final    Comment: (NOTE) The Xpert SA Assay (FDA approved for NASAL specimens in patients 2 years of age and older), is one component of a comprehensive surveillance program. It is not intended to diagnose infection nor to guide or monitor treatment. Performed at [facility], [place] [address] [place], [place] [address]      Radiology Studies: No results found. DG Knee Right Port  Final Result    DG C-Arm 1-60 Min  Final Result    DG Knee Complete 4 Views Right  Final Result    CT Knee Right Wo Contrast  Final Result    DG Chest Portable 1 View  Final Result    DG Knee Complete 4 Views Right  Final Result    DG Ankle Complete Right  Final Result    DG Tibia/Fibula Right  Final Result      Scheduled Meds: . brimonidine  1 drop Left Eye BID  . cycloSPORINE  1 drop Both Eyes BID  . dorzolamide-timolol  1 drop Left Eye BID  . enoxaparin (LOVENOX) injection  40 mg Subcutaneous Q24H  . gabapentin  300 mg Oral QHS  . insulin aspart  0-9 Units Subcutaneous TID WC  . insulin glargine  10 Units Subcutaneous Daily  . levothyroxine  75 mcg Oral Q0600  . senna-docusate  1 tablet Oral BID  . sodium chloride flush  3 mL Intravenous Q12H   PRN Meds: acetaminophen, hydrALAZINE, HYDROcodone-acetaminophen, labetalol, morphine injection, ondansetron (ZOFRAN) IV, oxyCODONE, polyethylene glycol, polyvinyl alcohol Continuous Infusions:    LOS: 4 days  Time spent: Greater than 50% of the 35 minute visit was spent in counseling/coordination of care for the patient as laid out in the A&P.   Dwyane Dee,  MD [facility] 12/06/2020, 2:53 PM

## 2020-12-07 DIAGNOSIS — S82201A Unspecified fracture of shaft of right tibia, initial encounter for closed fracture: Secondary | ICD-10-CM | POA: Diagnosis not present

## 2020-12-07 DIAGNOSIS — S82401A Unspecified fracture of shaft of right fibula, initial encounter for closed fracture: Secondary | ICD-10-CM | POA: Diagnosis not present

## 2020-12-07 LAB — CBC
HCT: 31.6 % — ABNORMAL LOW (ref 36.0–46.0)
Hemoglobin: 10.6 g/dL — ABNORMAL LOW (ref 12.0–15.0)
MCH: 30.2 pg (ref 26.0–34.0)
MCHC: 33.5 g/dL (ref 30.0–36.0)
MCV: 90 fL (ref 80.0–100.0)
Platelets: 270 10*3/uL (ref 150–400)
RBC: 3.51 MIL/uL — ABNORMAL LOW (ref 3.87–5.11)
RDW: 13.6 % (ref 11.5–15.5)
WBC: 10.8 10*3/uL — ABNORMAL HIGH (ref 4.0–10.5)
nRBC: 0 % (ref 0.0–0.2)

## 2020-12-07 LAB — GLUCOSE, CAPILLARY
Glucose-Capillary: 234 mg/dL — ABNORMAL HIGH (ref 70–99)
Glucose-Capillary: 270 mg/dL — ABNORMAL HIGH (ref 70–99)
Glucose-Capillary: 217 mg/dL — ABNORMAL HIGH (ref 70–99)
Glucose-Capillary: 171 mg/dL — ABNORMAL HIGH (ref 70–99)
Glucose-Capillary: 166 mg/dL — ABNORMAL HIGH (ref 70–99)

## 2020-12-07 LAB — BASIC METABOLIC PANEL
Anion gap: 8 (ref 5–15)
BUN: 11 mg/dL (ref 8–23)
CO2: 26 mmol/L (ref 22–32)
Calcium: 8.6 mg/dL — ABNORMAL LOW (ref 8.9–10.3)
Chloride: 103 mmol/L (ref 98–111)
Creatinine, Ser: 0.82 mg/dL (ref 0.44–1.00)
GFR, Estimated: >60 mL/min (ref >60)
Glucose, Bld: 243 mg/dL — ABNORMAL HIGH (ref 70–99)
Potassium: 5 mmol/L (ref 3.5–5.1)
Sodium: 137 mmol/L (ref 135–145)

## 2020-12-07 LAB — MAGNESIUM: Magnesium: 2 mg/dL (ref 1.7–2.4)

## 2020-12-07 MED ORDER — INSULIN GLARGINE 100 UNIT/ML ~~LOC~~ SOLN
12.0000 [IU] | Freq: Every day | SUBCUTANEOUS | Status: DC
  Administered 2020-12-07 – 2020-12-09 (×3): 12 [IU] via SUBCUTANEOUS
  Filled 2020-12-07 (×3): qty 0.12

## 2020-12-07 NOTE — Progress Notes (Signed)
PROGRESS NOTE    Jennifer Bailey   BSW:[id number]  DOB: April 10, 1935  DOA: 12/02/2020     5  PCP: System, Provider Not In  CC: fall at home  Hospital Course: Ms. Torre is an 86 yo female with PMH DMII, neuropathy, HTN, HLD, osteoporosis, hypothyroidism who presented to the hospital after a mechanical fall at home, tripping down 3 stairs on her deck.  She struck her right knee and leg on the ground.  No reports of head injury or loss of consciousness. On evaluation with x-ray of right lower extremity she was found to have a proximal tibial fracture and fibular neck fracture.   She underwent ORIF on 12/04/20. Post op rec's are for weight bearing for transfers only.    Interval History:  No events overnight.  Resting comfortably in bed this morning.  Pain remains well controlled.  Having some soreness on the bottom of her right heel due to pressure in bed.  Prevalon boot was ordered per nursing.  ROS: Constitutional: negative for chills, fatigue and fevers, Respiratory: negative for cough, Cardiovascular: negative for chest pain and Gastrointestinal: negative for abdominal pain  Assessment & Plan:  * Tibia/fibula fracture - s/p mechanical fall at home - continue pain control - s/p ORIF on 3/2 - per ortho: weightbearing for transfers only; no walker ambulation - PT: SNF recommended   Type II diabetes mellitus, uncontrolled ([place]) - continue lantus and SSI - continue CBG monitoring  -Glucose level still slightly above goal.  We will continue to adjust Lantus as needed  Acute blood loss anemia - Hgb down to 7.4 g/dL on 3/3 with hypotension; likely some ABLA from surgery - s/p 1 unit PRBC on 3/3: Repeat Hgb today is 9.8g/dL and pressure is improved  Glaucoma - continue home eye drops  Hyperlipidemia associated with type 2 diabetes mellitus ([place]) - holding statin for now  Hypertension associated with diabetes ([place]) - continue current regimen   Old records reviewed in  assessment of this patient  Antimicrobials: n/a  DVT prophylaxis: enoxaparin (LOVENOX) injection 40 mg Start: 12/06/20 0000   Code Status:   Code Status: Full Code Family Communication: daughter  Disposition Plan: Status is: Inpatient  Remains inpatient appropriate because:Ongoing diagnostic testing needed not appropriate for outpatient work up, IV treatments appropriate due to intensity of illness or inability to take PO and Inpatient level of care appropriate due to severity of illness   Dispo: The patient is from: Home              Anticipated d/c is to: SNF              Patient currently is not medically stable to d/c.   Difficult to place patient No  Risk of unplanned readmission score: Unplanned Admission- Pilot do not use: 16.44   Objective: Blood pressure (!) 144/45, pulse 79, temperature 99.1 F (37.3 C), temperature source Oral, resp. rate 17, height 4\' 10"  (1.473 m), weight 58.1 kg, SpO2 97 %.  Examination: General appearance: alert, cooperative and no distress Head: Normocephalic, without obvious abnormality, atraumatic Eyes: EOMI Lungs: clear to auscultation bilaterally Heart: regular rate and rhythm and S1, S2 normal Abdomen: normal findings: bowel sounds normal and soft, non-tender Extremities: RLE wrapped in bandage postop; sensation intact and soft compartments Skin: mobility and turgor normal Neurologic: Grossly normal  Consultants:   Ortho  Procedures:     Data Reviewed: I have personally reviewed following labs and imaging studies Results for orders placed or performed  during the hospital encounter of 12/02/20 (from the past 24 hour(s))  Glucose, capillary     Status: Abnormal   Collection Time: 12/06/20  4:57 PM  Result Value Ref Range   Glucose-Capillary 206 (H) 70 - 99 mg/dL  Glucose, capillary     Status: Abnormal   Collection Time: 12/06/20  7:41 PM  Result Value Ref Range   Glucose-Capillary 227 (H) 70 - 99 mg/dL  Basic metabolic panel      Status: Abnormal   Collection Time: 12/07/20  2:35 AM  Result Value Ref Range   Sodium 137 135 - 145 mmol/L   Potassium 5.0 3.5 - 5.1 mmol/L   Chloride 103 98 - 111 mmol/L   CO2 26 22 - 32 mmol/L   Glucose, Bld 243 (H) 70 - 99 mg/dL   BUN 11 8 - 23 mg/dL   Creatinine, Ser 0.82 0.44 - 1.00 mg/dL   Calcium 8.6 (L) 8.9 - 10.3 mg/dL   GFR, Estimated >60 >60 mL/min   Anion gap 8 5 - 15  CBC     Status: Abnormal   Collection Time: 12/07/20  2:35 AM  Result Value Ref Range   WBC 10.8 (H) 4.0 - 10.5 K/uL   RBC 3.51 (L) 3.87 - 5.11 MIL/uL   Hemoglobin 10.6 (L) 12.0 - 15.0 g/dL   HCT 31.6 (L) 36.0 - 46.0 %   MCV 90.0 80.0 - 100.0 fL   MCH 30.2 26.0 - 34.0 pg   MCHC 33.5 30.0 - 36.0 g/dL   RDW 13.6 11.5 - 15.5 %   Platelets 270 150 - 400 K/uL   nRBC 0.0 0.0 - 0.2 %  Magnesium     Status: None   Collection Time: 12/07/20  2:35 AM  Result Value Ref Range   Magnesium 2.0 1.7 - 2.4 mg/dL  Glucose, capillary     Status: Abnormal   Collection Time: 12/07/20  6:52 AM  Result Value Ref Range   Glucose-Capillary 234 (H) 70 - 99 mg/dL  Glucose, capillary     Status: Abnormal   Collection Time: 12/07/20  8:01 AM  Result Value Ref Range   Glucose-Capillary 270 (H) 70 - 99 mg/dL  Glucose, capillary     Status: Abnormal   Collection Time: 12/07/20 11:15 AM  Result Value Ref Range   Glucose-Capillary 217 (H) 70 - 99 mg/dL    Recent Results (from the past 240 hour(s))  Resp Panel by RT-PCR (Flu A&B, Covid) Nasopharyngeal Swab     Status: None   Collection Time: 12/02/20 12:17 PM   Specimen: Nasopharyngeal Swab; Nasopharyngeal(NP) swabs in vial transport medium  Result Value Ref Range Status   SARS Coronavirus 2 by RT PCR NEGATIVE NEGATIVE Final    Comment: (NOTE) SARS-CoV-2 target nucleic acids are NOT DETECTED.  The SARS-CoV-2 RNA is generally detectable in upper respiratory specimens during the acute phase of infection. The lowest concentration of SARS-CoV-2 viral copies this assay  can detect is 138 copies/mL. A negative result does not preclude SARS-Cov-2 infection and should not be used as the sole basis for treatment or other patient management decisions. A negative result may occur with  improper specimen collection/handling, submission of specimen other than nasopharyngeal swab, presence of viral mutation(s) within the areas targeted by this assay, and inadequate number of viral copies(<138 copies/mL). A negative result must be combined with clinical observations, patient history, and epidemiological information. The expected result is Negative.  Fact Sheet for Patients:  EntrepreneurPulse.com.au  Fact Sheet for  Healthcare Providers:  IncredibleEmployment.be  This test is no t yet approved or cleared by the [place] and  has been authorized for detection and/or diagnosis of SARS-CoV-2 by FDA under an Emergency Use Authorization (EUA). This EUA will remain  in effect (meaning this test can be used) for the duration of the COVID-19 declaration under Section 564(b)(1) of the Act, 21 U.S.C.section 360bbb-3(b)(1), unless the authorization is terminated  or revoked sooner.       Influenza A by PCR NEGATIVE NEGATIVE Final   Influenza B by PCR NEGATIVE NEGATIVE Final    Comment: (NOTE) The Xpert Xpress SARS-CoV-2/FLU/RSV plus assay is intended as an aid in the diagnosis of influenza from Nasopharyngeal swab specimens and should not be used as a sole basis for treatment. Nasal washings and aspirates are unacceptable for Xpert Xpress SARS-CoV-2/FLU/RSV testing.  Fact Sheet for Patients: EntrepreneurPulse.com.au  Fact Sheet for Healthcare Providers: IncredibleEmployment.be  This test is not yet approved or cleared by the [place] FDA and has been authorized for detection and/or diagnosis of SARS-CoV-2 by FDA under an Emergency Use Authorization (EUA). This EUA will  remain in effect (meaning this test can be used) for the duration of the COVID-19 declaration under Section 564(b)(1) of the Act, 21 U.S.C. section 360bbb-3(b)(1), unless the authorization is terminated or revoked.  Performed at [facility], [place] [address] [place], [place] [address]   Surgical pcr screen     Status: None   Collection Time: 12/02/20  8:10 PM   Specimen: Nasal Mucosa; Nasal Swab  Result Value Ref Range Status   MRSA, PCR NEGATIVE NEGATIVE Final   Staphylococcus aureus NEGATIVE NEGATIVE Final    Comment: (NOTE) The Xpert SA Assay (FDA approved for NASAL specimens in patients 75 years of age and older), is one component of a comprehensive surveillance program. It is not intended to diagnose infection nor to guide or monitor treatment. Performed at [facility], [place] [address], [place], [place] [address]      Radiology Studies: No results found. DG Knee Right Port  Final Result    DG C-Arm 1-60 Min  Final Result    DG Knee Complete 4 Views Right  Final Result    CT Knee Right Wo Contrast  Final Result    DG Chest Portable 1 View  Final Result    DG Knee Complete 4 Views Right  Final Result    DG Ankle Complete Right  Final Result    DG Tibia/Fibula Right  Final Result      Scheduled Meds: . brimonidine  1 drop Left Eye BID  . cycloSPORINE  1 drop Both Eyes BID  . dorzolamide-timolol  1 drop Left Eye BID  . enoxaparin (LOVENOX) injection  40 mg Subcutaneous Q24H  . gabapentin  300 mg Oral QHS  . insulin aspart  0-9 Units Subcutaneous TID WC  . insulin glargine  12 Units Subcutaneous Daily  . levothyroxine  75 mcg Oral Q0600  . senna-docusate  1 tablet Oral BID  . sodium chloride flush  3 mL Intravenous Q12H   PRN Meds: acetaminophen, hydrALAZINE, HYDROcodone-acetaminophen, labetalol, morphine injection, ondansetron (ZOFRAN) IV, oxyCODONE, polyethylene glycol, polyvinyl alcohol Continuous Infusions:    LOS: 5  days  Time spent: Greater than 50% of the 35 minute visit was spent in counseling/coordination of care for the patient as laid out in the A&P.   Dwyane Dee, MD [facility] 12/07/2020, 2:17 PM

## 2020-12-07 NOTE — Plan of Care (Signed)

## 2020-12-07 NOTE — Plan of Care (Signed)
  Problem: Education: Goal: Knowledge of General Education information will improve Description: Including pain rating scale, medication(s)/side effects and non-pharmacologic comfort measures Outcome: Progressing   Problem: Activity: Goal: Risk for activity intolerance will decrease Outcome: Progressing   Problem: Nutrition: Goal: Adequate nutrition will be maintained Outcome: Progressing   

## 2020-12-08 DIAGNOSIS — S82401A Unspecified fracture of shaft of right fibula, initial encounter for closed fracture: Secondary | ICD-10-CM | POA: Diagnosis not present

## 2020-12-08 DIAGNOSIS — S82201A Unspecified fracture of shaft of right tibia, initial encounter for closed fracture: Secondary | ICD-10-CM | POA: Diagnosis not present

## 2020-12-08 LAB — CBC WITH DIFFERENTIAL/PLATELET
Abs Immature Granulocytes: 0.07 10*3/uL (ref 0.00–0.07)
Basophils Absolute: 0 10*3/uL (ref 0.0–0.1)
Basophils Relative: 0 %
Eosinophils Absolute: 0.7 10*3/uL — ABNORMAL HIGH (ref 0.0–0.5)
Eosinophils Relative: 6 %
HCT: 29.5 % — ABNORMAL LOW (ref 36.0–46.0)
Hemoglobin: 10 g/dL — ABNORMAL LOW (ref 12.0–15.0)
Immature Granulocytes: 1 %
Lymphocytes Relative: 18 %
Lymphs Abs: 1.9 10*3/uL (ref 0.7–4.0)
MCH: 30.4 pg (ref 26.0–34.0)
MCHC: 33.9 g/dL (ref 30.0–36.0)
MCV: 89.7 fL (ref 80.0–100.0)
Monocytes Absolute: 1 10*3/uL (ref 0.1–1.0)
Monocytes Relative: 10 %
Neutro Abs: 6.8 10*3/uL (ref 1.7–7.7)
Neutrophils Relative %: 65 %
Platelets: 275 10*3/uL (ref 150–400)
RBC: 3.29 MIL/uL — ABNORMAL LOW (ref 3.87–5.11)
RDW: 13.5 % (ref 11.5–15.5)
WBC: 10.5 10*3/uL (ref 4.0–10.5)
nRBC: 0 % (ref 0.0–0.2)

## 2020-12-08 LAB — BASIC METABOLIC PANEL
Anion gap: 8 (ref 5–15)
BUN: 10 mg/dL (ref 8–23)
CO2: 25 mmol/L (ref 22–32)
Calcium: 8.4 mg/dL — ABNORMAL LOW (ref 8.9–10.3)
Chloride: 102 mmol/L (ref 98–111)
Creatinine, Ser: 0.71 mg/dL (ref 0.44–1.00)
GFR, Estimated: >60 mL/min (ref >60)
Glucose, Bld: 142 mg/dL — ABNORMAL HIGH (ref 70–99)
Potassium: 4.2 mmol/L (ref 3.5–5.1)
Sodium: 135 mmol/L (ref 135–145)

## 2020-12-08 LAB — GLUCOSE, CAPILLARY
Glucose-Capillary: 282 mg/dL — ABNORMAL HIGH (ref 70–99)
Glucose-Capillary: 176 mg/dL — ABNORMAL HIGH (ref 70–99)
Glucose-Capillary: 180 mg/dL — ABNORMAL HIGH (ref 70–99)
Glucose-Capillary: 230 mg/dL — ABNORMAL HIGH (ref 70–99)

## 2020-12-08 LAB — MAGNESIUM: Magnesium: 2.1 mg/dL (ref 1.7–2.4)

## 2020-12-08 MED ORDER — HYDROCODONE-ACETAMINOPHEN 5-325 MG PO TABS
1.0000 | ORAL_TABLET | ORAL | Status: DC | PRN
Start: 2020-12-08 — End: 2020-12-10

## 2020-12-08 MED ORDER — ACETAMINOPHEN 325 MG PO TABS
650.0000 mg | ORAL_TABLET | Freq: Three times a day (TID) | ORAL | Status: DC | PRN
  Administered 2020-12-08 – 2020-12-09 (×2): 650 mg via ORAL
  Filled 2020-12-08 (×2): qty 2

## 2020-12-08 NOTE — Plan of Care (Signed)
?  Problem: Clinical Measurements: ?Goal: Ability to maintain clinical measurements within normal limits will improve ?Outcome: Progressing ?Goal: Will remain free from infection ?Outcome: Progressing ?Goal: Diagnostic test results will improve ?Outcome: Progressing ?  ?

## 2020-12-08 NOTE — Plan of Care (Signed)

## 2020-12-08 NOTE — Plan of Care (Signed)

## 2020-12-08 NOTE — Progress Notes (Signed)
PROGRESS NOTE    Jennifer Bailey   SEG:[id number]  DOB: 1934-11-18  DOA: 12/02/2020     6  PCP: System, Provider Not In  CC: fall at home  Hospital Course: Jennifer Bailey is an 86 yo female with PMH DMII, neuropathy, HTN, HLD, osteoporosis, hypothyroidism who presented to the hospital after a mechanical fall at home, tripping down 3 stairs on her deck.  She struck her right knee and leg on the ground.  No reports of head injury or loss of consciousness. On evaluation with x-ray of right lower extremity she was found to have a proximal tibial fracture and fibular neck fracture.   She underwent ORIF on 12/04/20. Post op rec's are for weight bearing for transfers only.    Interval History:  No events overnight.  Seen this morning having just finished breakfast in bed.  Still working well with physical therapy.  Understands plan is pursuing rehab placement this coming week, [facility] still remains her first choice.  ROS: Constitutional: negative for chills, fatigue and fevers, Respiratory: negative for cough, Cardiovascular: negative for chest pain and Gastrointestinal: negative for abdominal pain  Assessment & Plan:  * Tibia/fibula fracture - s/p mechanical fall at home - continue pain control - s/p ORIF on 3/2 - per ortho: weightbearing for transfers only; no walker ambulation - PT: SNF recommended   Type II diabetes mellitus, uncontrolled ([place]) - continue lantus and SSI - continue CBG monitoring  - We will continue to adjust Lantus as needed  Acute blood loss anemia-resolved as of 12/08/2020 - Hgb down to 7.4 g/dL on 3/3 with hypotension; likely some ABLA from surgery - s/p 1 unit PRBC on 3/3 -Hemoglobin has remained stable  Glaucoma - continue home eye drops  Hyperlipidemia associated with type 2 diabetes mellitus ([place]) - holding statin for now  Hypertension associated with diabetes ([place]) - continue current regimen   Old records reviewed in assessment of this  patient  Antimicrobials: n/a  DVT prophylaxis: enoxaparin (LOVENOX) injection 40 mg Start: 12/06/20 0000   Code Status:   Code Status: Full Code Family Communication: daughter  Disposition Plan: Status is: Inpatient  Remains inpatient appropriate because:Ongoing diagnostic testing needed not appropriate for outpatient work up, IV treatments appropriate due to intensity of illness or inability to take PO and Inpatient level of care appropriate due to severity of illness   Dispo: The patient is from: Home              Anticipated d/c is to: SNF              Patient currently is medically stable to d/c.   Difficult to place patient No  Risk of unplanned readmission score: Unplanned Admission- Pilot do not use: 16.69   Objective: Blood pressure (!) 131/49, pulse 75, temperature 98.8 F (37.1 C), temperature source Oral, resp. rate 17, height 4\' 10"  (1.473 m), weight 58.1 kg, SpO2 98 %.  Examination: General appearance: alert, cooperative and no distress Head: Normocephalic, without obvious abnormality, atraumatic Eyes: EOMI Lungs: clear to auscultation bilaterally Heart: regular rate and rhythm and S1, S2 normal Abdomen: normal findings: bowel sounds normal and soft, non-tender Extremities: Surgical dressings remain in place.  Right lower extremity compartment soft.  Still 2+ edema in leg and appropriately tender to palpation Skin: mobility and turgor normal Neurologic: Grossly normal  Consultants:   Ortho  Procedures:     Data Reviewed: I have personally reviewed following labs and imaging studies Results for orders placed or  performed during the hospital encounter of 12/02/20 (from the past 24 hour(s))  Glucose, capillary     Status: Abnormal   Collection Time: 12/07/20  4:10 PM  Result Value Ref Range   Glucose-Capillary 171 (H) 70 - 99 mg/dL  Glucose, capillary     Status: Abnormal   Collection Time: 12/07/20  9:14 PM  Result Value Ref Range   Glucose-Capillary  166 (H) 70 - 99 mg/dL  Magnesium     Status: None   Collection Time: 12/08/20  1:23 AM  Result Value Ref Range   Magnesium 2.1 1.7 - 2.4 mg/dL  Basic metabolic panel     Status: Abnormal   Collection Time: 12/08/20  1:23 AM  Result Value Ref Range   Sodium 135 135 - 145 mmol/L   Potassium 4.2 3.5 - 5.1 mmol/L   Chloride 102 98 - 111 mmol/L   CO2 25 22 - 32 mmol/L   Glucose, Bld 142 (H) 70 - 99 mg/dL   BUN 10 8 - 23 mg/dL   Creatinine, Ser 0.71 0.44 - 1.00 mg/dL   Calcium 8.4 (L) 8.9 - 10.3 mg/dL   GFR, Estimated >60 >60 mL/min   Anion gap 8 5 - 15  CBC with Differential/Platelet     Status: Abnormal   Collection Time: 12/08/20  1:23 AM  Result Value Ref Range   WBC 10.5 4.0 - 10.5 K/uL   RBC 3.29 (L) 3.87 - 5.11 MIL/uL   Hemoglobin 10.0 (L) 12.0 - 15.0 g/dL   HCT 29.5 (L) 36.0 - 46.0 %   MCV 89.7 80.0 - 100.0 fL   MCH 30.4 26.0 - 34.0 pg   MCHC 33.9 30.0 - 36.0 g/dL   RDW 13.5 11.5 - 15.5 %   Platelets 275 150 - 400 K/uL   nRBC 0.0 0.0 - 0.2 %   Neutrophils Relative % 65 %   Neutro Abs 6.8 1.7 - 7.7 K/uL   Lymphocytes Relative 18 %   Lymphs Abs 1.9 0.7 - 4.0 K/uL   Monocytes Relative 10 %   Monocytes Absolute 1.0 0.1 - 1.0 K/uL   Eosinophils Relative 6 %   Eosinophils Absolute 0.7 (H) 0.0 - 0.5 K/uL   Basophils Relative 0 %   Basophils Absolute 0.0 0.0 - 0.1 K/uL   Immature Granulocytes 1 %   Abs Immature Granulocytes 0.07 0.00 - 0.07 K/uL  Glucose, capillary     Status: Abnormal   Collection Time: 12/08/20  9:29 AM  Result Value Ref Range   Glucose-Capillary 282 (H) 70 - 99 mg/dL  Glucose, capillary     Status: Abnormal   Collection Time: 12/08/20 12:41 PM  Result Value Ref Range   Glucose-Capillary 176 (H) 70 - 99 mg/dL    Recent Results (from the past 240 hour(s))  Resp Panel by RT-PCR (Flu A&B, Covid) Nasopharyngeal Swab     Status: None   Collection Time: 12/02/20 12:17 PM   Specimen: Nasopharyngeal Swab; Nasopharyngeal(NP) swabs in vial transport medium   Result Value Ref Range Status   SARS Coronavirus 2 by RT PCR NEGATIVE NEGATIVE Final    Comment: (NOTE) SARS-CoV-2 target nucleic acids are NOT DETECTED.  The SARS-CoV-2 RNA is generally detectable in upper respiratory specimens during the acute phase of infection. The lowest concentration of SARS-CoV-2 viral copies this assay can detect is 138 copies/mL. A negative result does not preclude SARS-Cov-2 infection and should not be used as the sole basis for treatment or other patient management decisions. A  negative result may occur with  improper specimen collection/handling, submission of specimen other than nasopharyngeal swab, presence of viral mutation(s) within the areas targeted by this assay, and inadequate number of viral copies(<138 copies/mL). A negative result must be combined with clinical observations, patient history, and epidemiological information. The expected result is Negative.  Fact Sheet for Patients:  EntrepreneurPulse.com.au  Fact Sheet for Healthcare Providers:  IncredibleEmployment.be  This test is no t yet approved or cleared by the [place] FDA and  has been authorized for detection and/or diagnosis of SARS-CoV-2 by FDA under an Emergency Use Authorization (EUA). This EUA will remain  in effect (meaning this test can be used) for the duration of the COVID-19 declaration under Section 564(b)(1) of the Act, 21 U.S.C.section 360bbb-3(b)(1), unless the authorization is terminated  or revoked sooner.       Influenza A by PCR NEGATIVE NEGATIVE Final   Influenza B by PCR NEGATIVE NEGATIVE Final    Comment: (NOTE) The Xpert Xpress SARS-CoV-2/FLU/RSV plus assay is intended as an aid in the diagnosis of influenza from Nasopharyngeal swab specimens and should not be used as a sole basis for treatment. Nasal washings and aspirates are unacceptable for Xpert Xpress SARS-CoV-2/FLU/RSV testing.  Fact Sheet for  Patients: EntrepreneurPulse.com.au  Fact Sheet for Healthcare Providers: IncredibleEmployment.be  This test is not yet approved or cleared by the [place] FDA and has been authorized for detection and/or diagnosis of SARS-CoV-2 by FDA under an Emergency Use Authorization (EUA). This EUA will remain in effect (meaning this test can be used) for the duration of the COVID-19 declaration under Section 564(b)(1) of the Act, 21 U.S.C. section 360bbb-3(b)(1), unless the authorization is terminated or revoked.  Performed at [facility], [place] [address], [place], [place] [address]   Surgical pcr screen     Status: None   Collection Time: 12/02/20  8:10 PM   Specimen: Nasal Mucosa; Nasal Swab  Result Value Ref Range Status   MRSA, PCR NEGATIVE NEGATIVE Final   Staphylococcus aureus NEGATIVE NEGATIVE Final    Comment: (NOTE) The Xpert SA Assay (FDA approved for NASAL specimens in patients 51 years of age and older), is one component of a comprehensive surveillance program. It is not intended to diagnose infection nor to guide or monitor treatment. Performed at [facility], [place] [name] [place], [place] [address]      Radiology Studies: No results found. DG Knee Right Port  Final Result    DG C-Arm 1-60 Min  Final Result    DG Knee Complete 4 Views Right  Final Result    CT Knee Right Wo Contrast  Final Result    DG Chest Portable 1 View  Final Result    DG Knee Complete 4 Views Right  Final Result    DG Ankle Complete Right  Final Result    DG Tibia/Fibula Right  Final Result      Scheduled Meds: . brimonidine  1 drop Left Eye BID  . cycloSPORINE  1 drop Both Eyes BID  . dorzolamide-timolol  1 drop Left Eye BID  . enoxaparin (LOVENOX) injection  40 mg Subcutaneous Q24H  . gabapentin  300 mg Oral QHS  . insulin aspart  0-9 Units Subcutaneous TID WC  . insulin glargine  12 Units Subcutaneous  Daily  . levothyroxine  75 mcg Oral Q0600  . senna-docusate  1 tablet Oral BID  . sodium chloride flush  3 mL Intravenous Q12H   PRN Meds:  acetaminophen, hydrALAZINE, HYDROcodone-acetaminophen, labetalol, morphine injection, ondansetron (ZOFRAN) IV, oxyCODONE, polyethylene glycol, polyvinyl alcohol Continuous Infusions:    LOS: 6 days  Time spent: Greater than 50% of the 35 minute visit was spent in counseling/coordination of care for the patient as laid out in the A&P.   Dwyane Dee, MD [facility] 12/08/2020, 3:44 PM

## 2020-12-09 ENCOUNTER — Other Ambulatory Visit: Payer: HMO

## 2020-12-09 DIAGNOSIS — L89609 Pressure ulcer of unspecified heel, unspecified stage: Secondary | ICD-10-CM

## 2020-12-09 DIAGNOSIS — S82201A Unspecified fracture of shaft of right tibia, initial encounter for closed fracture: Secondary | ICD-10-CM | POA: Diagnosis not present

## 2020-12-09 DIAGNOSIS — S82401A Unspecified fracture of shaft of right fibula, initial encounter for closed fracture: Secondary | ICD-10-CM | POA: Diagnosis not present

## 2020-12-09 DIAGNOSIS — Z7189 Other specified counseling: Secondary | ICD-10-CM

## 2020-12-09 HISTORY — DX: Pressure ulcer of unspecified heel, unspecified stage: L89.609

## 2020-12-09 HISTORY — DX: Other specified counseling: Z71.89

## 2020-12-09 LAB — GLUCOSE, CAPILLARY
Glucose-Capillary: 250 mg/dL — ABNORMAL HIGH (ref 70–99)
Glucose-Capillary: 283 mg/dL — ABNORMAL HIGH (ref 70–99)
Glucose-Capillary: 217 mg/dL — ABNORMAL HIGH (ref 70–99)
Glucose-Capillary: 245 mg/dL — ABNORMAL HIGH (ref 70–99)

## 2020-12-09 LAB — SARS CORONAVIRUS 2 (TAT 6-24 HRS): SARS Coronavirus 2: NEGATIVE

## 2020-12-09 MED ORDER — INSULIN GLARGINE 100 UNIT/ML ~~LOC~~ SOLN
15.0000 [IU] | Freq: Every day | SUBCUTANEOUS | Status: DC
  Administered 2020-12-10: 15 [IU] via SUBCUTANEOUS
  Filled 2020-12-09: qty 0.15

## 2020-12-09 NOTE — Consult Note (Addendum)
WOC Nurse Consult Note: Reason for Consult: Right heel with dark red deep tissue pressure injury with intact skin; 3X3cm.  Prevalon boot in place to reduce pressure. Daughter at the bedside to assess appearance and discuss plan of care.   Pressure Injury POA: No No further interventions needed at this time. [place] team will reassess next week to determine if a change in the plan of care is indicated at that time.  Julien Girt MSN, RN, [place], [place], [place]

## 2020-12-09 NOTE — Assessment & Plan Note (Signed)
-   given advanced age, recent surgery, prolonged immobility and long recovery course, patient does have rather increased chance of 26-month mortality risk - consider further [place] discussions outpatient at follow up, especially if patient re-admitted soon after discharge

## 2020-12-09 NOTE — Progress Notes (Signed)
Appropriate Use Committee Chart Review  Chart reviewed by the physician advisor with input from [facility] and the attending MD as needed for review of the appropriateness for SNF referral.  TOC notes, PT/OT/ST notes, nursing notes and physician notes reviewed for medical necessity to determine if the patient's needs are appropriate for short-term rehab to return to a prior level of function versus the likely need for custodial care.  At this time, the patient meets Medicare criteria for SNF placement.   Recommendations: The patient is SNF appropriate for short-term rehab/skilled nursing interventions.  A consult to the Transitions of Care Team has been made to discuss alternative options and discharge planning with the patient/family.    Jacquelynn Cree, MD Chief Physician Advisor  12/09/2020 2:25 PM

## 2020-12-09 NOTE — Care Management Important Message (Signed)
Important Message  Patient Details  Name: Jennifer Bailey MRN: [id number] Date of Birth: Dec 07, 1934   Medicare Important Message Given:  Yes     Megan P [place] 12/09/2020, 2:10 PM

## 2020-12-09 NOTE — Assessment & Plan Note (Signed)
-   evaluated by [facility] RN - continue prevalon boot to RLE

## 2020-12-09 NOTE — Progress Notes (Addendum)
PROGRESS NOTE    Jennifer Bailey   CLE:[id number]  DOB: March 06, 1935  DOA: 12/02/2020     7  PCP: System, Provider Not In  CC: fall at home  Hospital Course: Ms. Jennifer Bailey is an 86 yo female with PMH DMII, neuropathy, HTN, HLD, osteoporosis, hypothyroidism who presented to the hospital after a mechanical fall at home, tripping down 3 stairs on her deck.  She struck her right knee and leg on the ground.  No reports of head injury or loss of consciousness. On evaluation with x-ray of right lower extremity she was found to have a proximal tibial fracture and fibular neck fracture.   She underwent ORIF on 12/04/20. Post op rec's are for weight bearing for transfers only.  She will remain weightbearing for transfers only until follow-up outpatient with orthopedic surgery and further imaging before advancing weightbearing status.  Given her advanced age, recent surgery, prolonged immobility expected, and long recovery course, patient does have an increased chance of 1-month mortality risk.  Consider further goals of care discussions at follow-up after discharge especially if patient readmitted soon after discharge.   Interval History:  No events overnight.  Family bedside this am. Tentative plan is d/c Tuesday. She's doing okay otherwise and ready for d/c.   ROS: Constitutional: negative for chills, fatigue and fevers, Respiratory: negative for cough, Cardiovascular: negative for chest pain and Gastrointestinal: negative for abdominal pain  Assessment & Plan:  * Tibia/fibula fracture - s/p mechanical fall at home - continue pain control - s/p ORIF on 3/2 - per ortho: weightbearing for transfers only; no walker ambulation; will need to follow up with ortho before being cleared for further weight bearing status  - PT: SNF recommended  -Follow-up with orthopedic surgery 2 weeks after discharge for repeat x-rays and wound check - will need ongoing DVT ppx at discharge. Diona Fanti would be okay given  initial period with anticoagulation postop but given limited WB status and low bleed risk, would also consider xarelto for ongoing DVT ppx at discharge (but needs Hgb monitoring still)  Type II diabetes mellitus, uncontrolled ([place]) - continue lantus and SSI - continue CBG monitoring  - We will continue to adjust Lantus as needed  Acute blood loss anemia-resolved as of 12/08/2020 - Hgb down to 7.4 g/dL on 3/3 with hypotension; likely some ABLA from surgery - s/p 1 unit PRBC on 3/3 -Hemoglobin has remained stable  Counseling regarding goals of care - given advanced age, recent surgery, prolonged immobility and long recovery course, patient does have rather increased chance of 54-month mortality risk - consider further [place] discussions outpatient at follow up, especially if patient re-admitted soon after discharge   Pressure injury of skin of heel - evaluated by WOC RN - continue prevalon boot to RLE  Glaucoma - continue home eye drops  Hyperlipidemia associated with type 2 diabetes mellitus ([place]) - holding statin for now  Hypertension associated with diabetes ([place]) - continue current regimen   Old records reviewed in assessment of this patient  Antimicrobials: n/a  DVT prophylaxis: enoxaparin (LOVENOX) injection 40 mg Start: 12/06/20 0000   Code Status:   Code Status: Full Code Family Communication: daughter  Disposition Plan: Status is: Inpatient  Remains inpatient appropriate because:Ongoing diagnostic testing needed not appropriate for outpatient work up, IV treatments appropriate due to intensity of illness or inability to take PO and Inpatient level of care appropriate due to severity of illness   Dispo: The patient is from: Home  Anticipated d/c is to: SNF              Patient currently is medically stable to d/c.   Difficult to place patient No  Risk of unplanned readmission score: Unplanned Admission- Pilot do not use: 16.76   Objective: Blood  pressure (!) 132/46, pulse 76, temperature 98.5 F (36.9 C), temperature source Oral, resp. rate 16, height 4\' 10"  (1.473 m), weight 58.1 kg, SpO2 96 %.  Examination: General appearance: alert, cooperative and no distress Head: Normocephalic, without obvious abnormality, atraumatic Eyes: EOMI Lungs: clear to auscultation bilaterally Heart: regular rate and rhythm and S1, S2 normal Abdomen: normal findings: bowel sounds normal and soft, non-tender Extremities: Surgical dressings remain in place.  Right lower extremity compartment soft.  Still 2+ edema in leg and appropriately tender to palpation. Prevalon boot in place  Skin: mobility and turgor normal Neurologic: Grossly normal  Consultants:   Ortho  Procedures:     Data Reviewed: I have personally reviewed following labs and imaging studies Results for orders placed or performed during the hospital encounter of 12/02/20 (from the past 24 hour(s))  Glucose, capillary     Status: Abnormal   Collection Time: 12/08/20  5:23 PM  Result Value Ref Range   Glucose-Capillary 180 (H) 70 - 99 mg/dL  Glucose, capillary     Status: Abnormal   Collection Time: 12/08/20  9:14 PM  Result Value Ref Range   Glucose-Capillary 230 (H) 70 - 99 mg/dL  Glucose, capillary     Status: Abnormal   Collection Time: 12/09/20  6:59 AM  Result Value Ref Range   Glucose-Capillary 250 (H) 70 - 99 mg/dL  Glucose, capillary     Status: Abnormal   Collection Time: 12/09/20 11:21 AM  Result Value Ref Range   Glucose-Capillary 283 (H) 70 - 99 mg/dL    Recent Results (from the past 240 hour(s))  Resp Panel by RT-PCR (Flu A&B, Covid) Nasopharyngeal Swab     Status: None   Collection Time: 12/02/20 12:17 PM   Specimen: Nasopharyngeal Swab; Nasopharyngeal(NP) swabs in vial transport medium  Result Value Ref Range Status   SARS Coronavirus 2 by RT PCR NEGATIVE NEGATIVE Final    Comment: (NOTE) SARS-CoV-2 target nucleic acids are NOT DETECTED.  The SARS-CoV-2  RNA is generally detectable in upper respiratory specimens during the acute phase of infection. The lowest concentration of SARS-CoV-2 viral copies this assay can detect is 138 copies/mL. A negative result does not preclude SARS-Cov-2 infection and should not be used as the sole basis for treatment or other patient management decisions. A negative result may occur with  improper specimen collection/handling, submission of specimen other than nasopharyngeal swab, presence of viral mutation(s) within the areas targeted by this assay, and inadequate number of viral copies(<138 copies/mL). A negative result must be combined with clinical observations, patient history, and epidemiological information. The expected result is Negative.  Fact Sheet for Patients:  EntrepreneurPulse.com.au  Fact Sheet for Healthcare Providers:  IncredibleEmployment.be  This test is no t yet approved or cleared by the [place] FDA and  has been authorized for detection and/or diagnosis of SARS-CoV-2 by FDA under an Emergency Use Authorization (EUA). This EUA will remain  in effect (meaning this test can be used) for the duration of the COVID-19 declaration under Section 564(b)(1) of the Act, 21 U.S.C.section 360bbb-3(b)(1), unless the authorization is terminated  or revoked sooner.       Influenza A by PCR NEGATIVE NEGATIVE Final  Influenza B by PCR NEGATIVE NEGATIVE Final    Comment: (NOTE) The Xpert Xpress SARS-CoV-2/FLU/RSV plus assay is intended as an aid in the diagnosis of influenza from Nasopharyngeal swab specimens and should not be used as a sole basis for treatment. Nasal washings and aspirates are unacceptable for Xpert Xpress SARS-CoV-2/FLU/RSV testing.  Fact Sheet for Patients: EntrepreneurPulse.com.au  Fact Sheet for Healthcare Providers: IncredibleEmployment.be  This test is not yet approved or cleared by the  [place] FDA and has been authorized for detection and/or diagnosis of SARS-CoV-2 by FDA under an Emergency Use Authorization (EUA). This EUA will remain in effect (meaning this test can be used) for the duration of the COVID-19 declaration under Section 564(b)(1) of the Act, 21 U.S.C. section 360bbb-3(b)(1), unless the authorization is terminated or revoked.  Performed at [facility], [place] [address] [place], [place] [address]   Surgical pcr screen     Status: None   Collection Time: 12/02/20  8:10 PM   Specimen: Nasal Mucosa; Nasal Swab  Result Value Ref Range Status   MRSA, PCR NEGATIVE NEGATIVE Final   Staphylococcus aureus NEGATIVE NEGATIVE Final    Comment: (NOTE) The Xpert SA Assay (FDA approved for NASAL specimens in patients 74 years of age and older), is one component of a comprehensive surveillance program. It is not intended to diagnose infection nor to guide or monitor treatment. Performed at [facility], [place] [address], [place],  [address]      Radiology Studies: No results found. DG Knee Right Port  Final Result    DG C-Arm 1-60 Min  Final Result    DG Knee Complete 4 Views Right  Final Result    CT Knee Right Wo Contrast  Final Result    DG Chest Portable 1 View  Final Result    DG Knee Complete 4 Views Right  Final Result    DG Ankle Complete Right  Final Result    DG Tibia/Fibula Right  Final Result      Scheduled Meds: . brimonidine  1 drop Left Eye BID  . cycloSPORINE  1 drop Both Eyes BID  . dorzolamide-timolol  1 drop Left Eye BID  . enoxaparin (LOVENOX) injection  40 mg Subcutaneous Q24H  . gabapentin  300 mg Oral QHS  . insulin aspart  0-9 Units Subcutaneous TID WC  . [START ON 12/10/2020] insulin glargine  15 Units Subcutaneous Daily  . levothyroxine  75 mcg Oral Q0600  . senna-docusate  1 tablet Oral BID  . sodium chloride flush  3 mL Intravenous Q12H   PRN Meds: acetaminophen,  hydrALAZINE, HYDROcodone-acetaminophen, labetalol, morphine injection, ondansetron (ZOFRAN) IV, polyethylene glycol, polyvinyl alcohol Continuous Infusions:    LOS: 7 days  Time spent: Greater than 50% of the 35 minute visit was spent in counseling/coordination of care for the patient as laid out in the A&P.   Dwyane Dee, MD [facility] 12/09/2020, 2:33 PM

## 2020-12-09 NOTE — Progress Notes (Signed)
Physical Therapy Treatment Patient Details Name: Jennifer Bailey MRN: [id number] DOB: [birth year] Today's Date: 12/09/2020    History of Present Illness Pt is 86 yo female admitted on 12/02/20 aftr a fall down steps and found to have R proximal tibial fracture and fibular neck fracture.  She is s/p ORIF R tibial plateau fx on 12/04/20.  Pt with PMH including DM, neuropathy, HTN, HLD, osteoporosis, and hypothyroidism.    PT Comments    Pt's family was availiable today, and were interested in her progress with therapy to get to chair.  Propped up RLE on legrest to increase edema management, and encouraged pt and daughter to let staff know if pt became uncomfortable with the posture on the chair.  Pt is reasonably able to assist with LE ROM  And then was tired, encouraged her to call staff as needed.  Follow up for goals of acute PT.     Follow Up Recommendations  SNF     Equipment Recommendations  Wheelchair cushion (measurements PT);Wheelchair (measurements PT);3in1 (PT)    Recommendations for Other Services       Precautions / Restrictions Precautions Precautions: Fall Restrictions Weight Bearing Restrictions: Yes RLE Weight Bearing: Weight bearing as tolerated    Mobility  Bed Mobility Overal bed mobility: Needs Assistance Bed Mobility: Supine to Sit;Sit to Supine     Supine to sit: Mod assist Sit to supine: Min assist;Mod assist   General bed mobility comments: needs minor help to move RLE but also cues for sequences    Transfers Overall transfer level: Needs assistance Equipment used: 1 person hand held assist Transfers: Sit to/from [facility] Sit to Stand: Max assist Stand pivot transfers: Mod assist;Max assist       General transfer comment: PT assisted directly due to pt being fearful to try the walker  Ambulation/Gait             General Gait Details: deferred   Stairs             Wheelchair Mobility    Modified Rankin  (Stroke Patients Only)       Balance Overall balance assessment: Needs assistance;History of Falls Sitting-balance support: Feet supported Sitting balance-Leahy Scale: Fair     Standing balance support: Bilateral upper extremity supported;During functional activity Standing balance-Leahy Scale: Poor                              Cognition Arousal/Alertness: Awake/alert Behavior During Therapy: WFL for tasks assessed/performed Overall Cognitive Status: Within Functional Limits for tasks assessed                                 General Comments: has boot on RLE and is not able to WB on it      Exercises General Exercises - Lower Extremity Ankle Circles/Pumps: AAROM;Left Quad Sets: AAROM;AROM;10 reps Gluteal Sets: AROM;AAROM;10 reps Heel Slides: AAROM;10 reps Hip ABduction/ADduction: AAROM;10 reps    General Comments General comments (skin integrity, edema, etc.): pt was assisted to chair with pivot transfer and cued to reach back to control descent.  Her anxiety is an issue toward independence      Pertinent Vitals/Pain Pain Assessment: Faces Faces Pain Scale: Hurts little more Pain Location: R LE with movement Pain Descriptors / Indicators: Operative site guarding Pain Intervention(s): Limited activity within patient's tolerance;Monitored during session;Premedicated before session;Repositioned    Home  Living                      Prior Function            PT Goals (current goals can now be found in the care plan section) Acute Rehab PT Goals Patient Stated Goal: return home    Frequency    Min 3X/week      PT Plan Current plan remains appropriate    Co-evaluation              AM-PAC PT "6 Clicks" Mobility   Outcome Measure  Help needed turning from your back to your side while in a flat bed without using bedrails?: A Lot Help needed moving from lying on your back to sitting on the side of a flat bed without  using bedrails?: A Lot Help needed moving to and from a bed to a chair (including a wheelchair)?: A Lot Help needed standing up from a chair using your arms (e.g., wheelchair or bedside chair)?: A Lot Help needed to walk in hospital room?: Total Help needed climbing 3-5 steps with a railing? : Total 6 Click Score: 10    End of Session   Activity Tolerance: Patient limited by lethargy;Patient limited by fatigue;Patient limited by pain Patient left: in chair;with call bell/phone within reach;with chair alarm set;with family/visitor present Nurse Communication: Mobility status PT Visit Diagnosis: Unsteadiness on feet (R26.81);Muscle weakness (generalized) (M62.81)     Time: 1610-9604 PT Time Calculation (min) (ACUTE ONLY): 15 min  Charges:  $Therapeutic Activity: 8-22 mins                     Ramond Dial 12/09/2020, 4:22 PM Mee Hives, PT MS Acute Rehab Dept. Number: [place] [facility] [place]

## 2020-12-09 NOTE — Progress Notes (Signed)
Appropriate Use Committee Chart Review  Chart reviewed by the physician advisor with input from [facility] and the attending MD as needed for review of the appropriateness for SNF referral.  TOC notes, PT/OT/ST notes, nursing notes and physician notes reviewed for medical necessity to determine if the patient's needs are appropriate for short-term rehab to return to a prior level of function versus the likely need for custodial care.  At this time, the patient meets Medicare criteria for SNF placement.   Recommendations: The patient is SNF appropriate for short-term rehab/skilled nursing interventions.     Jacquelynn Cree, MD Chief Physician Advisor  12/09/2020 2:25 PM

## 2020-12-09 NOTE — TOC Progression Note (Addendum)
Transition of Care [facility]) - Progression Note    Patient Details  Name: Jennifer Bailey MRN: [id number] Date of Birth: [birth year]  [facility] CM/SW [place], [place] Phone Number: 12/09/2020, 2:43 PM  Clinical Narrative:     CSW was notified that pt had been accepted to [facility], her first choice facility. CSW confirmed with pt that she would like to accept the bed offer and facility noted a bed was available tomorrow. CSW started Insurance auth for [facility] SNF and transport, still pending. Covid was ordered and is also pending, transport paperwork is on chart. SW will continue to follow for DC planning.  [facility] approved SNF [phone], ambulance [phone]   Expected Discharge Plan: [place] Barriers to Discharge: Continued Medical Work up  Expected Discharge Plan and Services Expected Discharge Plan: [place] arrangements for the past 2 months: Single Family Home                                       Social Determinants of Health (SDOH) Interventions    Readmission Risk Interventions No flowsheet data found.

## 2020-12-10 DIAGNOSIS — D62 Acute posthemorrhagic anemia: Secondary | ICD-10-CM | POA: Diagnosis not present

## 2020-12-10 DIAGNOSIS — G629 Polyneuropathy, unspecified: Secondary | ICD-10-CM | POA: Diagnosis not present

## 2020-12-10 DIAGNOSIS — S82201A Unspecified fracture of shaft of right tibia, initial encounter for closed fracture: Secondary | ICD-10-CM | POA: Diagnosis not present

## 2020-12-10 DIAGNOSIS — M6281 Muscle weakness (generalized): Secondary | ICD-10-CM | POA: Diagnosis not present

## 2020-12-10 DIAGNOSIS — L89611 Pressure ulcer of right heel, stage 1: Secondary | ICD-10-CM | POA: Diagnosis not present

## 2020-12-10 DIAGNOSIS — H409 Unspecified glaucoma: Secondary | ICD-10-CM | POA: Diagnosis not present

## 2020-12-10 DIAGNOSIS — S82141D Displaced bicondylar fracture of right tibia, subsequent encounter for closed fracture with routine healing: Secondary | ICD-10-CM | POA: Diagnosis not present

## 2020-12-10 DIAGNOSIS — M79604 Pain in right leg: Secondary | ICD-10-CM | POA: Diagnosis not present

## 2020-12-10 DIAGNOSIS — M810 Age-related osteoporosis without current pathological fracture: Secondary | ICD-10-CM | POA: Diagnosis not present

## 2020-12-10 DIAGNOSIS — E1165 Type 2 diabetes mellitus with hyperglycemia: Secondary | ICD-10-CM | POA: Diagnosis not present

## 2020-12-10 DIAGNOSIS — S82401A Unspecified fracture of shaft of right fibula, initial encounter for closed fracture: Secondary | ICD-10-CM | POA: Diagnosis not present

## 2020-12-10 DIAGNOSIS — E1169 Type 2 diabetes mellitus with other specified complication: Secondary | ICD-10-CM | POA: Diagnosis not present

## 2020-12-10 DIAGNOSIS — L89612 Pressure ulcer of right heel, stage 2: Secondary | ICD-10-CM | POA: Diagnosis not present

## 2020-12-10 DIAGNOSIS — Z7189 Other specified counseling: Secondary | ICD-10-CM | POA: Diagnosis not present

## 2020-12-10 DIAGNOSIS — E1159 Type 2 diabetes mellitus with other circulatory complications: Secondary | ICD-10-CM | POA: Diagnosis not present

## 2020-12-10 DIAGNOSIS — I10 Essential (primary) hypertension: Secondary | ICD-10-CM | POA: Diagnosis not present

## 2020-12-10 DIAGNOSIS — W19XXXD Unspecified fall, subsequent encounter: Secondary | ICD-10-CM | POA: Diagnosis not present

## 2020-12-10 DIAGNOSIS — H04123 Dry eye syndrome of bilateral lacrimal glands: Secondary | ICD-10-CM | POA: Diagnosis not present

## 2020-12-10 DIAGNOSIS — Z7901 Long term (current) use of anticoagulants: Secondary | ICD-10-CM | POA: Diagnosis not present

## 2020-12-10 DIAGNOSIS — Z4789 Encounter for other orthopedic aftercare: Secondary | ICD-10-CM | POA: Diagnosis not present

## 2020-12-10 DIAGNOSIS — E785 Hyperlipidemia, unspecified: Secondary | ICD-10-CM | POA: Diagnosis not present

## 2020-12-10 DIAGNOSIS — W19XXXA Unspecified fall, initial encounter: Secondary | ICD-10-CM | POA: Diagnosis not present

## 2020-12-10 DIAGNOSIS — Z7401 Bed confinement status: Secondary | ICD-10-CM | POA: Diagnosis not present

## 2020-12-10 DIAGNOSIS — K5900 Constipation, unspecified: Secondary | ICD-10-CM | POA: Diagnosis not present

## 2020-12-10 DIAGNOSIS — E039 Hypothyroidism, unspecified: Secondary | ICD-10-CM | POA: Diagnosis not present

## 2020-12-10 DIAGNOSIS — I152 Hypertension secondary to endocrine disorders: Secondary | ICD-10-CM | POA: Diagnosis not present

## 2020-12-10 DIAGNOSIS — L89602 Pressure ulcer of unspecified heel, stage 2: Secondary | ICD-10-CM | POA: Diagnosis not present

## 2020-12-10 DIAGNOSIS — S82101D Unspecified fracture of upper end of right tibia, subsequent encounter for closed fracture with routine healing: Secondary | ICD-10-CM | POA: Diagnosis not present

## 2020-12-10 DIAGNOSIS — I959 Hypotension, unspecified: Secondary | ICD-10-CM | POA: Diagnosis not present

## 2020-12-10 DIAGNOSIS — M2550 Pain in unspecified joint: Secondary | ICD-10-CM | POA: Diagnosis not present

## 2020-12-10 DIAGNOSIS — S82831D Other fracture of upper and lower end of right fibula, subsequent encounter for closed fracture with routine healing: Secondary | ICD-10-CM | POA: Diagnosis not present

## 2020-12-10 LAB — CBC WITH DIFFERENTIAL/PLATELET
Abs Immature Granulocytes: 0.12 10*3/uL — ABNORMAL HIGH (ref 0.00–0.07)
Basophils Absolute: 0.1 10*3/uL (ref 0.0–0.1)
Basophils Relative: 1 %
Eosinophils Absolute: 0.7 10*3/uL — ABNORMAL HIGH (ref 0.0–0.5)
Eosinophils Relative: 7 %
HCT: 30.9 % — ABNORMAL LOW (ref 36.0–46.0)
Hemoglobin: 10.3 g/dL — ABNORMAL LOW (ref 12.0–15.0)
Immature Granulocytes: 1 %
Lymphocytes Relative: 12 %
Lymphs Abs: 1.2 10*3/uL (ref 0.7–4.0)
MCH: 30.4 pg (ref 26.0–34.0)
MCHC: 33.3 g/dL (ref 30.0–36.0)
MCV: 91.2 fL (ref 80.0–100.0)
Monocytes Absolute: 0.9 10*3/uL (ref 0.1–1.0)
Monocytes Relative: 9 %
Neutro Abs: 7.1 10*3/uL (ref 1.7–7.7)
Neutrophils Relative %: 70 %
Platelets: 366 10*3/uL (ref 150–400)
RBC: 3.39 MIL/uL — ABNORMAL LOW (ref 3.87–5.11)
RDW: 13.7 % (ref 11.5–15.5)
WBC: 10 10*3/uL (ref 4.0–10.5)
nRBC: 0 % (ref 0.0–0.2)

## 2020-12-10 LAB — GLUCOSE, CAPILLARY
Glucose-Capillary: 322 mg/dL — ABNORMAL HIGH (ref 70–99)
Glucose-Capillary: 255 mg/dL — ABNORMAL HIGH (ref 70–99)

## 2020-12-10 MED ORDER — POLYETHYLENE GLYCOL 3350 17 G PO PACK: 17.0000 g | PACK | Freq: Every day | ORAL | 0 refills | Status: DC | PRN

## 2020-12-10 MED ORDER — ASPIRIN 81 MG PO TABS: 81.0000 mg | ORAL_TABLET | Freq: Every day | ORAL | Status: DC

## 2020-12-10 MED ORDER — SENNOSIDES-DOCUSATE SODIUM 8.6-50 MG PO TABS: 1.0000 | ORAL_TABLET | Freq: Two times a day (BID) | ORAL | Status: DC

## 2020-12-10 NOTE — Discharge Summary (Signed)
Physician Discharge Summary  Jennifer Bailey XHB:[id number] DOB: 08/19/1935 DOA: 12/02/2020  PCP: System, Provider Not In  Admit date: 12/02/2020 Discharge date: 12/10/2020  Admitted From: Home  Discharge disposition: Skilled nursing facility  Recommendations for Outpatient Follow-Up:   . Follow up with your primary care provider at the skilled nursing facility in 3 to 5 days . Check CBC, BMP, magnesium in the next visit . Follow-up with Dr. Doreatha Martin, orthopedics in 2 weeks for x-rays and suture removal. . Weightbearing for transfers only; no walker ambulation; will need to follow up with ortho before being cleared for further weight bearing status .  Discharge Diagnosis:   Principal Problem:   Tibia/fibula fracture Active Problems:   Hypertension associated with diabetes ([place])   Hyperlipidemia associated with type 2 diabetes mellitus ([facility])   Type II diabetes mellitus, uncontrolled ([place])   Glaucoma   Pressure injury of skin of heel   Counseling regarding goals of care   Discharge Condition: Improved.  Diet recommendation:  Carbohydrate-modified.    Wound care: Surgical site care, deep tissue injury care  Code status: Full.   History of Present Illness:   Ms. House is an 86 yo female with past medical history of diabetes mellitus type 2 with neuropathy, hypertension, hyperlipidemia, hypothyroidism, osteoporosis presented to hospital after mechanical fall at home, tripping down 3 stairs on her deck. She struck her right knee and leg on the ground. No reports of head injury or loss of consciousness. On evaluation with x-ray of right lower extremity,she was found to have a proximal tibial fractureand fibular neck fracture. Patient was seen by orthopedics and underwent ORIF on 12/04/20. Post op rec's are for weight bearing for transfers only.  She will remain weightbearing for transfers only until follow-up outpatient with orthopedic surgery and further imaging before advancing  weightbearing status.  Hospital Course:   Following conditions were addressed during hospitalization as listed below  Right proximal tibia/fibula fracture  status post mechanical fall.  ORIF on 3//22.  As per orthopedics recommendation: Weightbearing for transfers only; no walker ambulation; will need to follow up with ortho before being cleared for further weight bearing status .  Patient was seen by physical therapy who recommended skilled nursing facility placement.  Patient will need to follow-up with orthopedic surgery in 2 weeks.  Orthopedics recommends Lovenox for DVT prophylaxis.  Aspirin to be started after Lovenox is done.  Type II diabetes mellitus, uncontrolled  On Tresiba at home.  Diabetic diet.  Acute blood loss anemia-resolved as of 12/08/2020 -Received 1 unit of packed RBC for hemoglobin of 7.4.  Latest hemoglobin of 10.0.  Has remained stable.  Pressure injury of skin of heel Continue pressure ulceration prevention protocol  Glaucoma Continue eye drops  Hyperlipidemia associated with type 2 diabetes mellitus Continue statins  Essential hypertension  Continue losartan.  Disposition.  At this time, patient is stable for disposition to skilled nursing facility.  Medical Consultants:    Orthopedics  Procedures:    ORIF on 3//22 by orthopedics. Subjective:   Today, patient family at bedside.  Patient denies overt pain, nausea, vomiting, cough, fever or chills.  Discharge Exam:   Vitals:   12/10/20 0353 12/10/20 0751  BP: (!) 153/59 (!) 149/46  Pulse: 72 81  Resp: 17 17  Temp: 98 F (36.7 C) 98 F (36.7 C)  SpO2: 100% 97%   Vitals:   12/09/20 1542 12/09/20 2023 12/10/20 0353 12/10/20 0751  BP: (!) 144/45 136/63 (!) 153/59 (!) 149/46  Pulse: 80 76 72 81  Resp: 17 17 17 17   Temp: 98.4 F (36.9 C) 98.3 F (36.8 C) 98 F (36.7 C) 98 F (36.7 C)  TempSrc: Oral   Oral  SpO2: 100% 100% 100% 97%  Weight:      Height:       General: Alert  awake, not in obvious distress HENT: pupils equally reacting to light,  No scleral pallor or icterus noted. Oral mucosa is moist.  Chest:  Clear breath sounds.  Diminished breath sounds bilaterally. No crackles or wheezes.  CVS: S1 &S2 heard. No murmur.  Regular rate and rhythm. Abdomen: Soft, nontender, nondistended.  Bowel sounds are heard.   Extremities: No cyanosis, clubbing or edema.  Peripheral pulses are palpable.  Right lower extremity mild edema.  Surgical dressing in place.  Right heel deep tissue injury. Psych: Alert, awake and oriented, normal mood CNS:  No cranial nerve deficits.  Power equal in all extremities.   Skin: Warm and dry, surgical site dressing, right heel deep tissue injury  The results of significant diagnostics from this hospitalization (including imaging, microbiology, ancillary and laboratory) are listed below for reference.     Diagnostic Studies:   DG Tibia/Fibula Right  Result Date: 12/02/2020 CLINICAL DATA:  Fall with right leg pain EXAM: RIGHT KNEE - COMPLETE 4+ VIEW; RIGHT TIBIA AND FIBULA - 2 VIEW; RIGHT ANKLE - COMPLETE 3+ VIEW COMPARISON:  None. FINDINGS: Acute transversely oriented fracture through the proximal tibial metaphysis with slight anterior apex angulation. No evidence of fracture extension to the articular surface of the tibiofemoral joint. Comminuted fracture of the fibular neck with minimal medial displacement. Trace knee joint effusion without evidence of lipohemarthrosis. Prominent soft tissue swelling overlying the proximal tibial and fibular fracture sites. Ankle mortise is congruent without evidence of fracture or malalignment. No soft tissue swelling at the ankle. IMPRESSION: 1. Acute transversely oriented fracture through the proximal tibial metaphysis with slight anterior apex angulation. 2. Comminuted fracture of the fibular neck with minimal medial displacement. 3. Right ankle is intact without fracture or malalignment. Electronically  Signed   By: Davina Poke D.O.   On: 12/02/2020 11:42   DG Ankle Complete Right  Result Date: 12/02/2020 CLINICAL DATA:  Fall with right leg pain EXAM: RIGHT KNEE - COMPLETE 4+ VIEW; RIGHT TIBIA AND FIBULA - 2 VIEW; RIGHT ANKLE - COMPLETE 3+ VIEW COMPARISON:  None. FINDINGS: Acute transversely oriented fracture through the proximal tibial metaphysis with slight anterior apex angulation. No evidence of fracture extension to the articular surface of the tibiofemoral joint. Comminuted fracture of the fibular neck with minimal medial displacement. Trace knee joint effusion without evidence of lipohemarthrosis. Prominent soft tissue swelling overlying the proximal tibial and fibular fracture sites. Ankle mortise is congruent without evidence of fracture or malalignment. No soft tissue swelling at the ankle. IMPRESSION: 1. Acute transversely oriented fracture through the proximal tibial metaphysis with slight anterior apex angulation. 2. Comminuted fracture of the fibular neck with minimal medial displacement. 3. Right ankle is intact without fracture or malalignment. Electronically Signed   By: Davina Poke D.O.   On: 12/02/2020 11:42   CT Knee Right Wo Contrast  Result Date: 12/02/2020 CLINICAL DATA:  Fall down stairs 3 days ago.  Tibial fracture. EXAM: CT OF THE RIGHT KNEE WITHOUT CONTRAST TECHNIQUE: Multidetector CT imaging of the right knee was performed according to the standard protocol. Multiplanar CT image reconstructions were also generated. COMPARISON:  Radiographs 12/02/2020 FINDINGS: Bones/Joint/Cartilage Transverse, minimally comminuted fracture  through the proximal tibial metaphysis demonstrates apex anterior angulation with 14 mm of fracture distraction anteriorly. There is up to 13 mm of posterior displacement of the distal fragment. This fracture extends into the tibiofibular articulation, and likely extends anteriorly toward the articular surface of the proximal tibia. There is no  definite involvement or displacement of the weight-bearing articular surface. There is a mildly displaced fracture of the fibular head. The distal femur and patella are intact. There is no joint effusion or lipohemarthrosis. Ligaments Suboptimally assessed by CT.  The cruciate ligaments appear intact. Muscles and Tendons The extensor mechanism is intact. The patellar tendon insertion on the tibial tubercle is just proximal to the fracture. Soft tissues Prepatellar soft tissue swelling. No foreign body or soft tissue emphysema. IMPRESSION: 1. Transverse, minimally comminuted fracture through the proximal tibial metaphysis as described. This fracture is mildly displaced and is situated just distal to insertion of the patellar tendon on the tibial tubercle. There is probable proximal extension of a nondisplaced component anteriorly towards the articular surface. 2. Mildly displaced fracture of the fibular head. 3. Intact femur. Electronically Signed   By: Richardean Sale M.D.   On: 12/02/2020 13:49   DG Chest Portable 1 View  Result Date: 12/02/2020 CLINICAL DATA:  Preop evaluation for upcoming leg surgery EXAM: PORTABLE CHEST 1 VIEW COMPARISON:  12/29/2010 FINDINGS: Cardiac shadows within normal limits. Mild aortic calcifications are seen. Lungs are well aerated bilaterally. Mild scarring in the left base is again seen. No acute bony abnormality is noted. IMPRESSION: Mild scarring in the left base.  No acute abnormality noted. Electronically Signed   By: Inez Catalina M.D.   On: 12/02/2020 12:43   DG Knee Complete 4 Views Right  Result Date: 12/02/2020 CLINICAL DATA:  Fall with right leg pain EXAM: RIGHT KNEE - COMPLETE 4+ VIEW; RIGHT TIBIA AND FIBULA - 2 VIEW; RIGHT ANKLE - COMPLETE 3+ VIEW COMPARISON:  None. FINDINGS: Acute transversely oriented fracture through the proximal tibial metaphysis with slight anterior apex angulation. No evidence of fracture extension to the articular surface of the tibiofemoral  joint. Comminuted fracture of the fibular neck with minimal medial displacement. Trace knee joint effusion without evidence of lipohemarthrosis. Prominent soft tissue swelling overlying the proximal tibial and fibular fracture sites. Ankle mortise is congruent without evidence of fracture or malalignment. No soft tissue swelling at the ankle. IMPRESSION: 1. Acute transversely oriented fracture through the proximal tibial metaphysis with slight anterior apex angulation. 2. Comminuted fracture of the fibular neck with minimal medial displacement. 3. Right ankle is intact without fracture or malalignment. Electronically Signed   By: Davina Poke D.O.   On: 12/02/2020 11:42     Labs:   Basic Metabolic Panel: Recent Labs  Lab 12/04/20 0437 12/05/20 0110 12/06/20 0632 12/07/20 0235 12/08/20 0123  NA 136 133* 135 137 135  K 4.8 4.2 4.1 5.0 4.2  CL 105 103 101 103 102  CO2 23 21* 25 26 25   GLUCOSE 307* 268* 269* 243* 142*  BUN 16 12 12 11 10   CREATININE 0.86 0.95 0.79 0.82 0.71  CALCIUM 8.4* 8.1* 8.2* 8.6* 8.4*  MG 1.9 1.7 1.9 2.0 2.1   GFR Estimated Creatinine Clearance: 40.2 mL/min (by C-G formula based on SCr of 0.71 mg/dL). Liver Function Tests: No results for input(s): AST, ALT, ALKPHOS, BILITOT, PROT, ALBUMIN in the last 168 hours. No results for input(s): LIPASE, AMYLASE in the last 168 hours. No results for input(s): AMMONIA in the last 168 hours.  Coagulation profile No results for input(s): INR, PROTIME in the last 168 hours.  CBC: Recent Labs  Lab 12/04/20 0437 12/05/20 0110 12/06/20 0632 12/07/20 0235 12/08/20 0123  WBC 9.8 10.0 9.0 10.8* 10.5  NEUTROABS  --   --   --   --  6.8  HGB 8.8* 7.4* 9.8* 10.6* 10.0*  HCT 27.2* 21.8* 28.7* 31.6* 29.5*  MCV 91.6 90.1 90.0 90.0 89.7  PLT 153 195 218 270 275   Cardiac Enzymes: No results for input(s): CKTOTAL, CKMB, CKMBINDEX, TROPONINI in the last 168 hours. BNP: Invalid input(s): POCBNP CBG: Recent Labs  Lab  12/09/20 0659 12/09/20 1121 12/09/20 1642 12/09/20 2023 12/10/20 0652  GLUCAP 250* 283* 217* 245* 322*   D-Dimer No results for input(s): DDIMER in the last 72 hours. Hgb [name] No results for input(s): HGBA1C in the last 72 hours. Lipid Profile No results for input(s): CHOL, HDL, LDLCALC, TRIG, CHOLHDL, LDLDIRECT in the last 72 hours. Thyroid function studies No results for input(s): TSH, T4TOTAL, T3FREE, THYROIDAB in the last 72 hours.  Invalid input(s): FREET3 Anemia work up No results for input(s): VITAMINB12, FOLATE, FERRITIN, TIBC, IRON, RETICCTPCT in the last 72 hours. Microbiology Recent Results (from the past 240 hour(s))  Resp Panel by RT-PCR (Flu A&B, Covid) Nasopharyngeal Swab     Status: None   Collection Time: 12/02/20 12:17 PM   Specimen: Nasopharyngeal Swab; Nasopharyngeal(NP) swabs in vial transport medium  Result Value Ref Range Status   SARS Coronavirus 2 by RT PCR NEGATIVE NEGATIVE Final    Comment: (NOTE) SARS-CoV-2 target nucleic acids are NOT DETECTED.  The SARS-CoV-2 RNA is generally detectable in upper respiratory specimens during the acute phase of infection. The lowest concentration of SARS-CoV-2 viral copies this assay can detect is 138 copies/mL. A negative result does not preclude SARS-Cov-2 infection and should not be used as the sole basis for treatment or other patient management decisions. A negative result may occur with  improper specimen collection/handling, submission of specimen other than nasopharyngeal swab, presence of viral mutation(s) within the areas targeted by this assay, and inadequate number of viral copies(<138 copies/mL). A negative result must be combined with clinical observations, patient history, and epidemiological information. The expected result is Negative.  Fact Sheet for Patients:  EntrepreneurPulse.com.au  Fact Sheet for Healthcare Providers:  IncredibleEmployment.be  This  test is no t yet approved or cleared by the [place] FDA and  has been authorized for detection and/or diagnosis of SARS-CoV-2 by FDA under an Emergency Use Authorization (EUA). This EUA will remain  in effect (meaning this test can be used) for the duration of the COVID-19 declaration under Section 564(b)(1) of the Act, 21 U.S.C.section 360bbb-3(b)(1), unless the authorization is terminated  or revoked sooner.       Influenza A by PCR NEGATIVE NEGATIVE Final   Influenza B by PCR NEGATIVE NEGATIVE Final    Comment: (NOTE) The Xpert Xpress SARS-CoV-2/FLU/RSV plus assay is intended as an aid in the diagnosis of influenza from Nasopharyngeal swab specimens and should not be used as a sole basis for treatment. Nasal washings and aspirates are unacceptable for Xpert Xpress SARS-CoV-2/FLU/RSV testing.  Fact Sheet for Patients: EntrepreneurPulse.com.au  Fact Sheet for Healthcare Providers: IncredibleEmployment.be  This test is not yet approved or cleared by the [place] FDA and has been authorized for detection and/or diagnosis of SARS-CoV-2 by FDA under an Emergency Use Authorization (EUA). This EUA will remain in effect (meaning this test can be used) for the duration  of the COVID-19 declaration under Section 564(b)(1) of the Act, 21 U.S.C. section 360bbb-3(b)(1), unless the authorization is terminated or revoked.  Performed at [facility], [place] [address], [place], [place] [address]   Surgical pcr screen     Status: None   Collection Time: 12/02/20  8:10 PM   Specimen: Nasal Mucosa; Nasal Swab  Result Value Ref Range Status   MRSA, PCR NEGATIVE NEGATIVE Final   Staphylococcus aureus NEGATIVE NEGATIVE Final    Comment: (NOTE) The Xpert SA Assay (FDA approved for NASAL specimens in patients 73 years of age and older), is one component of a comprehensive surveillance program. It is not intended to diagnose infection  nor to guide or monitor treatment. Performed at [facility], [place] [address], [place], [place] [address]   SARS CORONAVIRUS 2 (TAT 6-24 HRS) Nasopharyngeal Nasopharyngeal Swab     Status: None   Collection Time: 12/09/20  1:41 PM   Specimen: Nasopharyngeal Swab  Result Value Ref Range Status   SARS Coronavirus 2 NEGATIVE NEGATIVE Final    Comment: (NOTE) SARS-CoV-2 target nucleic acids are NOT DETECTED.  The SARS-CoV-2 RNA is generally detectable in upper and lower respiratory specimens during the acute phase of infection. Negative results do not preclude SARS-CoV-2 infection, do not rule out co-infections with other pathogens, and should not be used as the sole basis for treatment or other patient management decisions. Negative results must be combined with clinical observations, patient history, and epidemiological information. The expected result is Negative.  Fact Sheet for Patients: SugarRoll.be  Fact Sheet for Healthcare Providers: https://www.woods-mathews.com/  This test is not yet approved or cleared by the [place] FDA and  has been authorized for detection and/or diagnosis of SARS-CoV-2 by FDA under an Emergency Use Authorization (EUA). This EUA will remain  in effect (meaning this test can be used) for the duration of the COVID-19 declaration under Se ction 564(b)(1) of the Act, 21 U.S.C. section 360bbb-3(b)(1), unless the authorization is terminated or revoked sooner.  Performed at [facility], [place] [address], [place], [place] [address]      Discharge Instructions:   Discharge Instructions    Call MD for:  redness, tenderness, or signs of infection (pain, swelling, redness, odor or green/yellow discharge around incision site)   Complete by: As directed    Call MD for:  severe uncontrolled pain   Complete by: As directed    Call MD for:  temperature >100.4   Complete by: As directed    Diet Carb  Modified   Complete by: As directed    Discharge instructions   Complete by: As directed    Follow-up with your primary care provider at the skilled nursing facility in 3 to 5 days..  Check blood work at that time. Follow-up with orthopedics in 2 weeks for wound check, and x-rays.   Discharge wound care:   Complete by: As directed    Deep tissue injury care.   Increase activity slowly   Complete by: As directed      Allergies as of 12/10/2020      Reactions   Macrodantin [nitrofurantoin Macrocrystal] Swelling   Prednisone    Make real "shaky-like, nervious"   Penicillins Rash   Has patient had a PCN reaction causing immediate rash, facial/tongue/throat swelling, SOB or lightheadedness with hypotension:Yes Has patient had a PCN reaction causing severe rash involving mucus membranes or skin necrosis: No Has patient had a PCN reaction that required hospitalization:  No Has patient had a PCN reaction occurring within the last 10 years:No If all of the above answers are "NO", then may proceed with Cephalosporin use.      Medication List    TAKE these medications   Alcohol Pads 70 % Pads USE 4 PER DAY   Alphagan P 0.1 % Soln Generic drug: brimonidine Place 1 drop into the left eye 2 (two) times daily.   aspirin 81 MG tablet Take 1 tablet (81 mg total) by mouth daily. Start taking on: January 05, 2021 What changed: These instructions start on January 05, 2021. If you are unsure what to do until then, ask your doctor or other care provider.   BD Pen Needle Nano U/F 32G X 4 MM Misc Generic drug: Insulin Pen Needle USE AS DIRECTED FIVE TIMES A DAY.   cycloSPORINE 0.05 % ophthalmic emulsion Commonly known as: RESTASIS 1 drop 2 (two) times daily.   dorzolamidel-timolol 22.3-6.8 MG/ML Soln ophthalmic solution Commonly known as: COSOPT Place 1 drop into the left eye 2 (two) times daily.   enoxaparin 40 MG/0.4ML injection Commonly known as: LOVENOX Inject 0.4 mLs (40 mg total) into the  skin daily for 28 days.   FreeStyle Libre 14 Day Reader Kerrin Mo USE DAILY TO CHECK BLOOD SUGAR   FreeStyle Libre 14 Day Sensor Misc Use as instructed to check blood sugar daily   gabapentin 100 MG capsule Commonly known as: NEURONTIN TAKE 3 TO 4 CAPSULES BY MOUTH AT BEDTIME. What changed: See the new instructions.   HYDROcodone-acetaminophen 7.5-325 MG tablet Commonly known as: NORCO Take 1 tablet by mouth every 6 (six) hours as needed for moderate pain or severe pain.   levothyroxine 75 MCG tablet Commonly known as: SYNTHROID TAKE 1 TABLET BY MOUTH DAILY BEFORE BREAKFAST, EXCEPT TAKE 1 AND 1/2 TABLETS ON SUNDAY. What changed: See the new instructions.   losartan 50 MG tablet Commonly known as: COZAAR TAKE 1 TABLET BY MOUTH DAILY.   lovastatin 40 MG tablet Commonly known as: MEVACOR TAKE 1 TABLET BY MOUTH ONCE DAILY WITH A MEAL. What changed: See the new instructions.   NovoLOG FlexPen 100 UNIT/ML FlexPen Generic drug: insulin aspart INJECT 3 TO 5 UNITS SUBCUTANEOUSLY BEFORE MEALS AS DIRECTED. What changed: See the new instructions.   Omega-3 1000 MG Caps Take 1 capsule by mouth 4 (four) times daily.   OneTouch Verio test strip Generic drug: glucose blood USE TO TEST BLOOD SUGAR 4 TIMES DAILY   pantoprazole 40 MG tablet Commonly known as: PROTONIX Take 40 mg by mouth 2 (two) times daily as needed (indigestion).   polyethylene glycol 17 g packet Commonly known as: MIRALAX / GLYCOLAX Take 17 g by mouth daily as needed for mild constipation. If not relieved with senokot   PreserVision AREDS 2 Caps Take 1 capsule by mouth 2 (two) times daily.   senna-docusate 8.6-50 MG tablet Commonly known as: Senokot-S Take 1 tablet by mouth 2 (two) times daily. While on narcotics   Tresiba FlexTouch 100 UNIT/ML FlexTouch Pen Generic drug: insulin degludec INJECT 0.11 MLS (11 UNITS TOTAL) INTO THE SKIN DAILY. What changed: See the new instructions.   vitamin C 100 MG  tablet Take 500 mg by mouth daily.   Vitamin D (Ergocalciferol) 1.25 MG (50000 UNIT) Caps capsule Commonly known as: DRISDOL TAKE 1 CAPSULE BY MOUTH ONCE A MONTH. What changed: See the new instructions.            Discharge Care Instructions  (From admission, onward)  Start     Ordered   12/10/20 0000  Discharge wound care:       Comments: Deep tissue injury care.   12/10/20 1040          Contact information for follow-up providers    Haddix, Thomasene Lot, MD. Schedule an appointment as soon as possible for a visit in 2 week(s).   Specialty: Orthopedic Surgery Why: for suture removal and repeat x-rays Contact information: [place] [address] [phone]            Contact information for after-discharge care    Destination    [facility] Preferred SNF .   Service: Skilled Nursing Contact information: [place] [phone]                   Time coordinating discharge: 39 minutes  Signed:  Laxman Pokhrel  [facility] 12/10/2020, 10:41 AM

## 2020-12-10 NOTE — Progress Notes (Signed)
Physical Therapy Treatment Patient Details Name: Jennifer Bailey MRN: [id number] DOB: Dec 02, 1934 Today's Date: 12/10/2020    History of Present Illness Pt is 86 yo female admitted on 12/02/20 aftr a fall down steps and found to have R proximal tibial fracture and fibular neck fracture.  She is s/p ORIF R tibial plateau fx on 12/04/20.  Pt with PMH including DM, neuropathy, HTN, HLD, osteoporosis, and hypothyroidism.    PT Comments    Pt was seen with CNA in to guard transfers to [facility] and then to help with cleaning up and bathing.  Pt stood up to 2 minutes with TDWB only on RLE, very much relying on LLE for standing support.  Pt is motivated to get up and move, but is still quite weak with low endurance also hindered by transfer only permission.  Follow acutely for progressing standing tolerance with monitoring of wb on RLE, as pt continues to be restricted on WB.    Follow Up Recommendations  SNF     Equipment Recommendations  Wheelchair cushion (measurements PT);Wheelchair (measurements PT);3in1 (PT)    Recommendations for Other Services       Precautions / Restrictions Precautions Precautions: Fall Restrictions Weight Bearing Restrictions: Yes RLE Weight Bearing: Weight bearing as tolerated Other Position/Activity Restrictions: R LE WBAT for transfers only, NOT ambulation    Mobility  Bed Mobility Overal bed mobility: Needs Assistance Bed Mobility: Supine to Sit     Supine to sit: Min assist     General bed mobility comments: min assist to side of bed    Transfers Overall transfer level: Needs assistance Equipment used: 1 person hand held assist Transfers: Sit to/from [employer] Transfers Sit to Stand: Mod assist Stand pivot transfers: Mod assist;Max assist       General transfer comment: pt continues to be fearful of walker  Ambulation/Gait             General Gait Details: no permission yet   Stairs             Wheelchair Mobility     Modified Rankin (Stroke Patients Only)       Balance Overall balance assessment: Needs assistance;History of Falls Sitting-balance support: Feet supported Sitting balance-Leahy Scale: Fair     Standing balance support: Bilateral upper extremity supported;During functional activity Standing balance-Leahy Scale: Poor Standing balance comment: anterior shift to support balance                            Cognition Arousal/Alertness: Awake/alert Behavior During Therapy: WFL for tasks assessed/performed Overall Cognitive Status: Within Functional Limits for tasks assessed                                 General Comments: RLE boot to protect it      Exercises      General Comments General comments (skin integrity, edema, etc.): assisted to chair with pivot, then to [facility] and then to chair      Pertinent Vitals/Pain Pain Assessment: Faces Faces Pain Scale: Hurts little more Pain Location: RLE initially with movement Pain Descriptors / Indicators: Operative site guarding Pain Intervention(s): Limited activity within patient's tolerance;Premedicated before session;Repositioned    Home Living                      Prior Function  PT Goals (current goals can now be found in the care plan section) Acute Rehab PT Goals Patient Stated Goal: return home Progress towards PT goals: Progressing toward goals    Frequency    Min 3X/week      PT Plan Current plan remains appropriate    Co-evaluation              AM-PAC PT "6 Clicks" Mobility   Outcome Measure  Help needed turning from your back to your side while in a flat bed without using bedrails?: A Little Help needed moving from lying on your back to sitting on the side of a flat bed without using bedrails?: A Lot Help needed moving to and from a bed to a chair (including a wheelchair)?: A Lot Help needed standing up from a chair using your arms (e.g., wheelchair or  bedside chair)?: A Lot Help needed to walk in hospital room?: Total Help needed climbing 3-5 steps with a railing? : Total 6 Click Score: 11    End of Session Equipment Utilized During Treatment: Gait belt Activity Tolerance: Patient limited by pain;Treatment limited secondary to medical complications (Comment) Patient left: in chair;with call bell/phone within reach;with nursing/sitter in room Nurse Communication: Mobility status PT Visit Diagnosis: Unsteadiness on feet (R26.81);Muscle weakness (generalized) (M62.81)     Time: 3354-5625 PT Time Calculation (min) (ACUTE ONLY): 24 min  Charges:  $Therapeutic Activity: 23-37 mins                   Ramond Dial 12/10/2020, 2:21 PM Mee Hives, PT MS Acute Rehab Dept. Number: [place] and [place]

## 2020-12-10 NOTE — TOC Progression Note (Signed)
Transition of Care [facility] - Progression Note    Patient Details  Name: Jennifer Bailey MRN: [id number] Date of Birth: August 26, 1935  Transition of Care [facility]) CM/SW [place], [place] Phone Number: 12/10/2020, 10:24 AM  Clinical Narrative:    CSW received insurance auth for disposition. Insurance auth # [id number], approved for 7 days. Auth for transport [id number].  TOC will continue to assist with disposition planning.   Expected Discharge Plan: [place] Barriers to Discharge: Continued Medical Work up  Expected Discharge Plan and Services Expected Discharge Plan: [place] arrangements for the past 2 months: Single Family Home                                       Social Determinants of Health (SDOH) Interventions    Readmission Risk Interventions No flowsheet data found.

## 2020-12-10 NOTE — Progress Notes (Signed)
Inpatient Diabetes Program Recommendations  AACE/ADA: New Consensus Statement on Inpatient Glycemic Control (2015)  Target Ranges:  Prepandial:   less than 140 mg/dL      Peak postprandial:   less than 180 mg/dL (1-2 hours)      Critically ill patients:  140 - 180 mg/dL   Lab Results  Component Value Date   GLUCAP 322 (H) 12/10/2020   HGBA1C 7.3 (H) 12/02/2020    Review of Glycemic Control  Blood sugars still above goal of < 180 mg/dL.  Inpatient Diabetes Program Recommendations:     Add Novolog 3 units TID with meals if eating > 50% meal.  Continue to follow.  Thank you. Lorenda Peck, RD, LDN, CDE Inpatient Diabetes Coordinator [phone]

## 2020-12-10 NOTE — Plan of Care (Signed)
  Problem: Education: Goal: Knowledge of General Education information will improve Description: Including pain rating scale, medication(s)/side effects and non-pharmacologic comfort measures Outcome: Completed/Met

## 2020-12-10 NOTE — TOC Transition Note (Signed)
Transition of Care [facility]) - CM/SW Discharge Note   Patient Details  Name: Jennifer Bailey MRN: [id number] Date of Birth: [birth year]  Transition of Care [facility]) CM/SW Contact:  Gabrielle Dare Phone Number: 12/10/2020, 1:22 PM   Clinical Narrative:    Patient will Discharge To: [facility] Anticipated DC Date:12/10/20 Family Notified:yes, daughter Celso Amy, [phone] Transport MR:AJHH   Per MD patient ready for DC to [facility] . RN, patient, patient's family, and facility notified of DC. Assessment, Fl2/Pasrr, and Discharge Summary sent to facility. RN given number for report [phone], Room # 209). DC packet on chart. Ambulance transport requested for patient.   CSW signing off.  Reed Breech LCSWA [phone]     Final next level of care: Skilled Nursing Facility Barriers to Discharge: No Barriers Identified   Patient Goals and CMS Choice Patient states their goals for this hospitalization and ongoing recovery are:: get strong enout to go back home after rehab. CMS Medicare.gov Compare Post Acute Care list provided to:: Patient Choice offered to / list presented to : Patient  Discharge Placement              Patient chooses bed at: [facility] [place] Patient to be transferred to facility by: [place] Name of family member notified: Celso Amy, daughter Patient and family notified of of transfer: 12/10/20  Discharge Plan and Services                                     Social Determinants of Health (SDOH) Interventions     Readmission Risk Interventions No flowsheet data found.

## 2020-12-11 DIAGNOSIS — L89611 Pressure ulcer of right heel, stage 1: Secondary | ICD-10-CM | POA: Diagnosis not present

## 2020-12-15 DIAGNOSIS — E1169 Type 2 diabetes mellitus with other specified complication: Secondary | ICD-10-CM | POA: Diagnosis not present

## 2020-12-15 DIAGNOSIS — D62 Acute posthemorrhagic anemia: Secondary | ICD-10-CM | POA: Diagnosis not present

## 2020-12-15 DIAGNOSIS — E1159 Type 2 diabetes mellitus with other circulatory complications: Secondary | ICD-10-CM | POA: Diagnosis not present

## 2020-12-15 DIAGNOSIS — S82201A Unspecified fracture of shaft of right tibia, initial encounter for closed fracture: Secondary | ICD-10-CM | POA: Diagnosis not present

## 2020-12-15 DIAGNOSIS — W19XXXA Unspecified fall, initial encounter: Secondary | ICD-10-CM | POA: Diagnosis not present

## 2020-12-15 DIAGNOSIS — L89602 Pressure ulcer of unspecified heel, stage 2: Secondary | ICD-10-CM | POA: Diagnosis not present

## 2020-12-16 ENCOUNTER — Other Ambulatory Visit: Payer: HMO

## 2020-12-18 DIAGNOSIS — L89611 Pressure ulcer of right heel, stage 1: Secondary | ICD-10-CM | POA: Diagnosis not present

## 2020-12-23 ENCOUNTER — Other Ambulatory Visit: Payer: HMO

## 2020-12-23 ENCOUNTER — Ambulatory Visit: Payer: HMO | Admitting: Endocrinology

## 2020-12-24 DIAGNOSIS — S82141D Displaced bicondylar fracture of right tibia, subsequent encounter for closed fracture with routine healing: Secondary | ICD-10-CM | POA: Diagnosis not present

## 2020-12-25 DIAGNOSIS — L89612 Pressure ulcer of right heel, stage 2: Secondary | ICD-10-CM | POA: Diagnosis not present

## 2020-12-30 ENCOUNTER — Ambulatory Visit: Payer: HMO | Admitting: Endocrinology

## 2021-01-01 DIAGNOSIS — L89612 Pressure ulcer of right heel, stage 2: Secondary | ICD-10-CM | POA: Diagnosis not present

## 2021-01-08 DIAGNOSIS — L89612 Pressure ulcer of right heel, stage 2: Secondary | ICD-10-CM | POA: Diagnosis not present

## 2021-01-13 DIAGNOSIS — E785 Hyperlipidemia, unspecified: Secondary | ICD-10-CM | POA: Diagnosis not present

## 2021-01-13 DIAGNOSIS — Z79899 Other long term (current) drug therapy: Secondary | ICD-10-CM | POA: Diagnosis not present

## 2021-01-13 DIAGNOSIS — Z7982 Long term (current) use of aspirin: Secondary | ICD-10-CM | POA: Diagnosis not present

## 2021-01-13 DIAGNOSIS — E1142 Type 2 diabetes mellitus with diabetic polyneuropathy: Secondary | ICD-10-CM | POA: Diagnosis not present

## 2021-01-13 DIAGNOSIS — I10 Essential (primary) hypertension: Secondary | ICD-10-CM | POA: Diagnosis not present

## 2021-01-13 DIAGNOSIS — M80061D Age-related osteoporosis with current pathological fracture, right lower leg, subsequent encounter for fracture with routine healing: Secondary | ICD-10-CM | POA: Diagnosis not present

## 2021-01-13 DIAGNOSIS — H409 Unspecified glaucoma: Secondary | ICD-10-CM | POA: Diagnosis not present

## 2021-01-13 DIAGNOSIS — Z9181 History of falling: Secondary | ICD-10-CM | POA: Diagnosis not present

## 2021-01-13 DIAGNOSIS — W109XXD Fall (on) (from) unspecified stairs and steps, subsequent encounter: Secondary | ICD-10-CM | POA: Diagnosis not present

## 2021-01-13 DIAGNOSIS — Z794 Long term (current) use of insulin: Secondary | ICD-10-CM | POA: Diagnosis not present

## 2021-01-13 DIAGNOSIS — E1169 Type 2 diabetes mellitus with other specified complication: Secondary | ICD-10-CM | POA: Diagnosis not present

## 2021-01-13 DIAGNOSIS — E039 Hypothyroidism, unspecified: Secondary | ICD-10-CM | POA: Diagnosis not present

## 2021-01-21 DIAGNOSIS — S82141D Displaced bicondylar fracture of right tibia, subsequent encounter for closed fracture with routine healing: Secondary | ICD-10-CM | POA: Diagnosis not present

## 2021-01-30 ENCOUNTER — Telehealth: Payer: Self-pay | Admitting: *Deleted

## 2021-01-30 NOTE — Telephone Encounter (Signed)
Valetta Fuller would like an authorization for nursing to treat once a week for five weeks for patient. Please advise, thanks.

## 2021-01-30 NOTE — Telephone Encounter (Signed)
Left msg to call back. AS, CMA

## 2021-01-30 NOTE — Telephone Encounter (Signed)
Verbal auth given for 1x wk for 5 wks. AS, CMA

## 2021-02-05 ENCOUNTER — Other Ambulatory Visit: Payer: Self-pay | Admitting: Endocrinology

## 2021-02-07 ENCOUNTER — Telehealth: Payer: Self-pay | Admitting: Physician Assistant

## 2021-02-07 NOTE — Telephone Encounter (Signed)
[facility] left a voicemail stating patient is experiencing numbness and tingling in her pinky and ring finger on her right hand. Patient is recently out of hospital. Please advise, thanks.

## 2021-02-07 NOTE — Telephone Encounter (Signed)
Patient was contacted and told she needed to schedule for Hospital follow up as well for the numbness and tingling in her fingers and she declined and it not want to come into office or schedule an appointment.

## 2021-02-07 NOTE — Telephone Encounter (Signed)
Patient should have a hospital follow up apt.   Please contact to schedule next available apt per [facility]. AS, CMA

## 2021-02-10 ENCOUNTER — Other Ambulatory Visit: Payer: Self-pay

## 2021-02-10 ENCOUNTER — Other Ambulatory Visit (INDEPENDENT_AMBULATORY_CARE_PROVIDER_SITE_OTHER): Payer: HMO

## 2021-02-10 DIAGNOSIS — E559 Vitamin D deficiency, unspecified: Secondary | ICD-10-CM

## 2021-02-10 DIAGNOSIS — Z794 Long term (current) use of insulin: Secondary | ICD-10-CM | POA: Diagnosis not present

## 2021-02-10 DIAGNOSIS — E063 Autoimmune thyroiditis: Secondary | ICD-10-CM | POA: Diagnosis not present

## 2021-02-10 DIAGNOSIS — E1165 Type 2 diabetes mellitus with hyperglycemia: Secondary | ICD-10-CM | POA: Diagnosis not present

## 2021-02-10 LAB — HEMOGLOBIN A1C: Hgb A1c MFr Bld: 7.9 % — ABNORMAL HIGH (ref 4.6–6.5)

## 2021-02-10 LAB — TSH: TSH: 7.83 u[IU]/mL — ABNORMAL HIGH (ref 0.35–4.50)

## 2021-02-10 LAB — T4, FREE: Free T4: 1.05 ng/dL (ref 0.60–1.60)

## 2021-02-11 LAB — BASIC METABOLIC PANEL
BUN: 17 mg/dL (ref 6–23)
CO2: 24 mEq/L (ref 19–32)
Calcium: 9.5 mg/dL (ref 8.4–10.5)
Chloride: 104 mEq/L (ref 96–112)
Creatinine, Ser: 0.83 mg/dL (ref 0.40–1.20)
GFR: 65.08 mL/min (ref >60.00)
Glucose, Bld: 121 mg/dL — ABNORMAL HIGH (ref 70–99)
Potassium: 4.6 mEq/L (ref 3.5–5.1)
Sodium: 138 mEq/L (ref 135–145)

## 2021-02-11 LAB — LIPID PANEL
Cholesterol: 138 mg/dL (ref 0–200)
HDL: 50.7 mg/dL (ref >39.00)
LDL Cholesterol: 62 mg/dL (ref 0–99)
NonHDL: 86.98
Total CHOL/HDL Ratio: 3
Triglycerides: 126 mg/dL (ref 0.0–149.0)
VLDL: 25.2 mg/dL (ref 0.0–40.0)

## 2021-02-11 LAB — VITAMIN D 25 HYDROXY (VIT D DEFICIENCY, FRACTURES): VITD: 50.06 ng/mL (ref 30.00–100.00)

## 2021-02-14 ENCOUNTER — Other Ambulatory Visit: Payer: BC Managed Care – PPO

## 2021-02-16 NOTE — Progress Notes (Signed)
Patient ID: Jennifer Bailey, female   DOB: 06-08-1935, 86 y.o.   MRN: [id number]   Chief complaint: follow up    History of Present Illness     Type 2 DIABETES MELITUS, date of diagnosis: 2003  She has been on insulin for several years but continues to have labile blood sugars despite using basal bolus regimen  She is very compliant with her insulin at mealtimes  Is taking small amounts of insulin and is not on any oral hypoglycemics She was changed from Levemir to [place] insulin in 1/17 because of relatively inconsistent control   RECENT history:   Insulin regimen: Tresiba 11 units in a.m.;   mealtime NovoLog coverage: At breakfast:  3 units, 3  lunch and 4 at supper                                  Current blood sugar patterns from freestyle libre, diabetes management and problems identified:  Daily management, problems identified and abnormal patterns are discussed in detail in the CGM analysis   Her [name] is 7.9 compared to 7.3   Although her [name] is 7.9 her blood sugars recently are averaging only 137  However not clear if her freestyle Elenor Legato is accurate and may be reading falsely low at least at times  She will occasionally have significant rise in blood sugar after breakfast or lunch but not consistently  As before she is consistent with taking her mealtime bolus before starting to eat  Tyler Aas was increased back to 11 units after her last visit from 10 units  She is again trying to be as active as possible  Weight is about the same   Blood sugar patterns show the following on her CGM interpretation   Blood sugars are showing modest rise in postprandial blood sugars especially at breakfast time and after lunch with increased variability during the daytime  LOWEST blood sugars are around 2 AM on an average averaging 113 and highest 163 mid afternoon  POSTPRANDIAL readings are variable after breakfast and lunch but are usually fairly even before and  after dinner meals  Hypoglycemia albeit very infrequent and only transiently at 1 PM or 6 PM  Overnight readings fluctuating between 70 and 180 but trending higher between 4 AM until breakfast time  She has not compared freestyle libre readings with fingersticks and periodically will have readings in the low 60s   CGM use % of time  97  2-week average/GV  137  Time in range    85    %  % Time Above 180  15  % Time above 250   % Time Below 70  <1     PRE-MEAL Fasting Lunch Dinner Bedtime Overall  Glucose range:       Averages:  132  133  147     POST-MEAL PC Breakfast PC Lunch PC Dinner  Glucose range:     Averages:  158  163  127   PREVIOUS data:        CGM use % of time  98  2-week average/SD  140+/-30  Time in range        80%  % Time Above 180  17  % Time above 250   % Time Below 70  3     PRE-MEAL Fasting Lunch Dinner Bedtime Overall  Glucose range:       Averages: 145  146  143  140   POST-MEAL PC Breakfast PC Lunch PC Dinner  Glucose range:     Averages:  117  125       Symptoms with hypoglycemia: feels weak and shaky Low sugars are treated with Soft drink or glucose tablets      Meals: 3 meals per day.  Usually has small portions. Eggs/bread or one pancake with protein at 6:30 AM-7 am; has mid morning snack.  Has hs snack if blood sugar not high Evening meal usually balanced at 5 pm with 1-2 carbohydrates  Physical activity: exercise: Some walking and yardwork         Dietician visit: Most recent: 2004            Wt Readings from Last 3 Encounters:  02/17/21 129 lb 6.4 oz (58.7 kg)  12/02/20 128 lb (58.1 kg)  08/26/20 129 lb 12.8 oz ([id number] kg)   Complications: are: Mild neuropathy controlled symptomatically  Lab Results  Component Value Date   HGBA1C 7.9 (H) 02/10/2021   HGBA1C 7.3 (H) 12/02/2020   HGBA1C 7.6 (H) 08/12/2020   Lab Results  Component Value Date   MICROALBUR <0.7 04/15/2020   LDLCALC 62 02/10/2021   CREATININE 0.83 02/10/2021     HYPERTENSION:  this has been mild and treated with 50 mg losartan.  She is checking her blood pressure at home and it is usually normal   BP Readings from Last 3 Encounters:  02/17/21 134/68  12/10/20 (!) 153/42  08/26/20 124/82    OSTEOPENIA:  She has had osteopenia with T score -2.3 at the hip in 3/09 and was started on Fosamax at that time. This was stopped in 02/2014.   Last bone density in 2016: DualFemur Neck Right 10/03/2015 T score -1.9  She had a traumatic fracture of her tibial plateau on 12/02/2020 Has not done her bone density as ordered  History of vitamin D Deficiency, she is taking 50,000 units monthly with adequate levels  Lab Results  Component Value Date   VD25OH 50.06 02/10/2021   VD25OH 60.16 02/06/2019    OTHER active problems: See review of systems   LABS:    No visits with results within 1 Week(s) from this visit.  Latest known visit with results is:  Lab on 02/10/2021  Component Date Value Ref Range Status  . VITD 02/10/2021 50.06  30.00 - 100.00 ng/mL Final  . Cholesterol 02/10/2021 138  0 - 200 mg/dL Final   ATP III Classification       Desirable:  < 200 mg/dL               Borderline High:  200 - 239 mg/dL          High:  > = 240 mg/dL  . Triglycerides 02/10/2021 126.0  0.0 - 149.0 mg/dL Final   Normal:  <150 mg/dLBorderline High:  150 - 199 mg/dL  . HDL 02/10/2021 50.70  >39.00 mg/dL Final  . VLDL 02/10/2021 25.2  0.0 - 40.0 mg/dL Final  . LDL Cholesterol 02/10/2021 62  0 - 99 mg/dL Final  . Total CHOL/HDL Ratio 02/10/2021 3   Final                  Men          Women1/2 Average Risk     3.4          3.3Average Risk          5.0  4.42X Average Risk          9.6          7.13X Average Risk          15.0          11.0                      . NonHDL 02/10/2021 86.98   Final   NOTE:  Non-HDL goal should be 30 mg/dL higher than patient's LDL goal (i.e. LDL goal of < 70 mg/dL, would have non-HDL goal of < 100 mg/dL)  . Free T4  02/10/2021 1.05  0.60 - 1.60 ng/dL Final   Comment: Specimens from patients who are undergoing biotin therapy and /or ingesting biotin supplements may contain high levels of biotin.  The higher biotin concentration in these specimens interferes with this Free T4 assay.  Specimens that contain high levels  of biotin may cause false high results for this Free T4 assay.  Please interpret results in light of the total clinical presentation of the patient.    Marland Kitchen TSH 02/10/2021 7.83* 0.35 - 4.50 uIU/mL Final  . Sodium 02/10/2021 138  135 - 145 mEq/L Final  . Potassium 02/10/2021 4.6  3.5 - 5.1 mEq/L Final  . Chloride 02/10/2021 104  96 - 112 mEq/L Final  . CO2 02/10/2021 24  19 - 32 mEq/L Final  . Glucose, Bld 02/10/2021 121* 70 - 99 mg/dL Final  . BUN 02/10/2021 17  6 - 23 mg/dL Final  . Creatinine, Ser 02/10/2021 0.83  0.40 - 1.20 mg/dL Final  . GFR 02/10/2021 65.08  >60.00 mL/min Final   Calculated using the CKD-EPI Creatinine Equation (2021)  . Calcium 02/10/2021 9.5  8.4 - 10.5 mg/dL Final  . Hgb [name] MFr Bld 02/10/2021 7.9* 4.6 - 6.5 % Final   Glycemic Control Guidelines for People with Diabetes:Non Diabetic:  <6%Goal of Therapy: <7%Additional Action Suggested:  >8%     Allergies as of 02/17/2021      Reactions   Macrodantin [nitrofurantoin Macrocrystal] Swelling   Prednisone    Make real "shaky-like, nervious"   Penicillins Rash   Has patient had a PCN reaction causing immediate rash, facial/tongue/throat swelling, SOB or lightheadedness with hypotension:Yes Has patient had a PCN reaction causing severe rash involving mucus membranes or skin necrosis: No Has patient had a PCN reaction that required hospitalization: No Has patient had a PCN reaction occurring within the last 10 years:No If all of the above answers are "NO", then may proceed with Cephalosporin use.      Medication List       Accurate as of Feb 17, 2021 11:59 PM. If you have any questions, ask your nurse or doctor.         Alcohol Pads 70 % Pads USE 4 PER DAY   Alphagan P 0.1 % Soln Generic drug: brimonidine Place 1 drop into the left eye 2 (two) times daily.   aspirin 81 MG tablet Take 1 tablet (81 mg total) by mouth daily.   BD Pen Needle Nano U/F 32G X 4 MM Misc Generic drug: Insulin Pen Needle USE AS DIRECTED FIVE TIMES A DAY.   cycloSPORINE 0.05 % ophthalmic emulsion Commonly known as: RESTASIS 1 drop 2 (two) times daily.   dorzolamidel-timolol 22.3-6.8 MG/ML Soln ophthalmic solution Commonly known as: COSOPT Place 1 drop into the left eye 2 (two) times daily.   enoxaparin 40 MG/0.4ML injection Commonly known as: LOVENOX Inject 0.4  mLs (40 mg total) into the skin daily for 28 days.   FreeStyle Libre 14 Day Reader Kerrin Mo USE DAILY TO CHECK BLOOD SUGAR   FreeStyle Libre 2 Sensor Misc APPLY TO ARM EVERY 14 DAYS TO MONITOR BLOOD SUGAR.   gabapentin 100 MG capsule Commonly known as: NEURONTIN TAKE 3 TO 4 CAPSULES BY MOUTH AT BEDTIME. What changed: See the new instructions.   HYDROcodone-acetaminophen 7.5-325 MG tablet Commonly known as: NORCO Take 1 tablet by mouth every 6 (six) hours as needed for moderate pain or severe pain.   levothyroxine 75 MCG tablet Commonly known as: SYNTHROID TAKE 1 TABLET BY MOUTH DAILY BEFORE BREAKFAST, EXCEPT TAKE 1 AND 1/2 TABLETS ON SUNDAY. What changed: See the new instructions.   losartan 50 MG tablet Commonly known as: COZAAR TAKE 1 TABLET BY MOUTH DAILY.   lovastatin 40 MG tablet Commonly known as: MEVACOR TAKE 1 TABLET BY MOUTH ONCE DAILY WITH A MEAL. What changed: See the new instructions.   NovoLOG FlexPen 100 UNIT/ML FlexPen Generic drug: insulin aspart INJECT 3 TO 5 UNITS SUBCUTANEOUSLY BEFORE MEALS AS DIRECTED. What changed: See the new instructions.   Omega-3 1000 MG Caps Take 1 capsule by mouth 4 (four) times daily.   OneTouch Verio test strip Generic drug: glucose blood USE TO TEST BLOOD SUGAR 4 TIMES DAILY    pantoprazole 40 MG tablet Commonly known as: PROTONIX Take 40 mg by mouth 2 (two) times daily as needed (indigestion).   polyethylene glycol 17 g packet Commonly known as: MIRALAX / GLYCOLAX Take 17 g by mouth daily as needed for mild constipation. If not relieved with senokot   PreserVision AREDS 2 Caps Take 1 capsule by mouth 2 (two) times daily.   senna-docusate 8.6-50 MG tablet Commonly known as: Senokot-S Take 1 tablet by mouth 2 (two) times daily. While on narcotics   Tresiba FlexTouch 100 UNIT/ML FlexTouch Pen Generic drug: insulin degludec INJECT 0.11 MLS (11 UNITS TOTAL) INTO THE SKIN DAILY. What changed: See the new instructions.   vitamin C 100 MG tablet Take 500 mg by mouth daily.   Vitamin D (Ergocalciferol) 1.25 MG (50000 UNIT) Caps capsule Commonly known as: DRISDOL TAKE 1 CAPSULE BY MOUTH ONCE A MONTH. What changed: See the new instructions.       Allergies:  Allergies  Allergen Reactions  . Macrodantin [Nitrofurantoin Macrocrystal] Swelling  . Prednisone     Make real "shaky-like, nervious"  . Penicillins Rash    Has patient had a PCN reaction causing immediate rash, facial/tongue/throat swelling, SOB or lightheadedness with hypotension:Yes Has patient had a PCN reaction causing severe rash involving mucus membranes or skin necrosis: No Has patient had a PCN reaction that required hospitalization: No Has patient had a PCN reaction occurring within the last 10 years:No If all of the above answers are "NO", then may proceed with Cephalosporin use.     Past Medical History:  Diagnosis Date  . Diabetes mellitus without complication ([place])   . Thyroid disease     Past Surgical History:  Procedure Laterality Date  . ABDOMINAL HYSTERECTOMY    . CHOLECYSTECTOMY    . COLON SURGERY    . ORIF TIBIA PLATEAU Right 12/04/2020   Procedure: OPEN REDUCTION INTERNAL FIXATION (ORIF) TIBIAL PLATEAU;  Surgeon: Shona Needles, MD;  Location: [facility];  Service:  Orthopedics;  Laterality: Right;    Family History  Problem Relation Age of Onset  . Cancer Mother   . Diabetes Brother     Social  History:  reports that she has never smoked. She has never used smokeless tobacco. She reports that she does not drink alcohol and does not use drugs.   Review of Systems   NEUROPATHY: Treated with gabapentin taking at bedtime with no recent increase in symptoms    HYPOTHYROIDISM: She has had primary hypothyroidism, long-standing . Her TSH levels have been previously stable with 75 mcg levothyroxine 7.5 tablets per week, has been on this dose since 11/15  She has not changed the way she takes her supplement on empty stomach in the morning and not clear why she had a high TSH She has had some fatigue on and off  Lab Results  Component Value Date   TSH 7.83 (H) 02/10/2021   TSH 3.82 08/12/2020   TSH 3.64 04/15/2020   FREET4 1.05 02/10/2021   FREET4 1.27 04/15/2020   FREET4 1.24 08/07/2019      HYPERLIPIDEMIA: The lipid abnormality consists of elevated LDL,  taking lovastatin with consistently good control, last level below 70.  Lab Results  Component Value Date   CHOL 138 02/10/2021   HDL 50.70 02/10/2021   LDLCALC 62 02/10/2021   TRIG 126.0 02/10/2021   CHOLHDL 3 02/10/2021     She has had her Covid booster and flu shots   Examination:   BP 134/68   Pulse 80   Ht 4\' 10"  (1.473 m)   Wt 129 lb 6.4 oz (58.7 kg)   SpO2 98%   BMI 27.04 kg/m   Body mass index is 27.04 kg/m.      ASSESSMENT/ PLAN:    Diabetes type 2 on insulin   See history of present illness for detailed discussion of current management, blood sugar patterns and problems identified  Her [name] is slightly higher at 7.9  She is on basal bolus insulin  Her blood sugars recently are looking better than expected for her [name] Her freestyle Elenor Legato was downloaded and patterns reviewed She does have usual variability after meals despite following instructions for  timing of insulin, adjustment based on meal size and blood sugar level Again taking relatively low-dose of insulin especially at mealtime Although her freestyle Elenor Legato is reading in the 60s sometimes not clear if this is truly accurate and she needs to monitor fingersticks periodically also  She will call if she is having any excessive hypoglycemia and continue to increase the dose of NovoLog by 1-2 units when eating out, any high fat meals or high carbohydrate intake  Hypertension:  Her blood pressure is controlled She will continue 50 mg of losartan    Hypothyroidism :her TSH is unusually high Since she just had another 75 mcg levothyroxine refilled she can take 8 tablets a week for now    Needs bone density, she will call to schedule  Follow-up in 4 months     Patient Instructions  Take 1 extra thyroid pill Sundays  Compare fingerstick to [place] if hi or low     Elayne Snare 02/18/2021, 12:48 PM

## 2021-02-17 ENCOUNTER — Other Ambulatory Visit: Payer: Self-pay

## 2021-02-17 ENCOUNTER — Encounter: Payer: Self-pay | Admitting: Endocrinology

## 2021-02-17 ENCOUNTER — Ambulatory Visit (INDEPENDENT_AMBULATORY_CARE_PROVIDER_SITE_OTHER): Payer: HMO | Admitting: Endocrinology

## 2021-02-17 VITALS — BP 134/68 | HR 80 | Ht <= 58 in | Wt 129.4 lb

## 2021-02-17 DIAGNOSIS — M85859 Other specified disorders of bone density and structure, unspecified thigh: Secondary | ICD-10-CM

## 2021-02-17 DIAGNOSIS — E1165 Type 2 diabetes mellitus with hyperglycemia: Secondary | ICD-10-CM | POA: Diagnosis not present

## 2021-02-17 DIAGNOSIS — E063 Autoimmune thyroiditis: Secondary | ICD-10-CM | POA: Diagnosis not present

## 2021-02-17 DIAGNOSIS — Z794 Long term (current) use of insulin: Secondary | ICD-10-CM

## 2021-02-17 DIAGNOSIS — I10 Essential (primary) hypertension: Secondary | ICD-10-CM | POA: Diagnosis not present

## 2021-02-17 NOTE — Patient Instructions (Addendum)
Take 1 extra thyroid pill Sundays  Compare fingerstick to [place] if hi or low

## 2021-02-24 DIAGNOSIS — Z794 Long term (current) use of insulin: Secondary | ICD-10-CM | POA: Diagnosis not present

## 2021-02-24 DIAGNOSIS — E119 Type 2 diabetes mellitus without complications: Secondary | ICD-10-CM | POA: Diagnosis not present

## 2021-02-24 DIAGNOSIS — H353131 Nonexudative age-related macular degeneration, bilateral, early dry stage: Secondary | ICD-10-CM | POA: Diagnosis not present

## 2021-02-24 DIAGNOSIS — H04123 Dry eye syndrome of bilateral lacrimal glands: Secondary | ICD-10-CM | POA: Diagnosis not present

## 2021-02-24 DIAGNOSIS — H40011 Open angle with borderline findings, low risk, right eye: Secondary | ICD-10-CM | POA: Diagnosis not present

## 2021-02-24 DIAGNOSIS — H401221 Low-tension glaucoma, left eye, mild stage: Secondary | ICD-10-CM | POA: Diagnosis not present

## 2021-03-04 DIAGNOSIS — S82141D Displaced bicondylar fracture of right tibia, subsequent encounter for closed fracture with routine healing: Secondary | ICD-10-CM | POA: Diagnosis not present

## 2021-03-08 IMAGING — MG DIGITAL SCREENING BILAT W/ TOMO W/ CAD
8 series · 8 of 24 positions shown · non-contrast
Comparison: Previous exam(s).

CLINICAL DATA: Screening.

EXAM:
DIGITAL SCREENING BILATERAL MAMMOGRAM WITH TOMO AND CAD

[L MLO synth-2D]
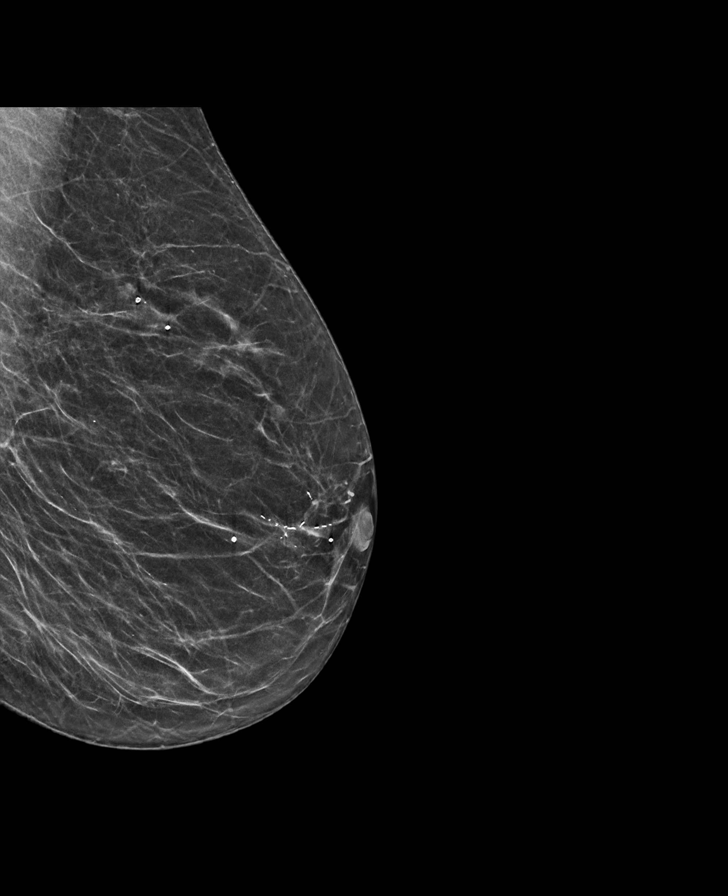

[R CC synth-2D]
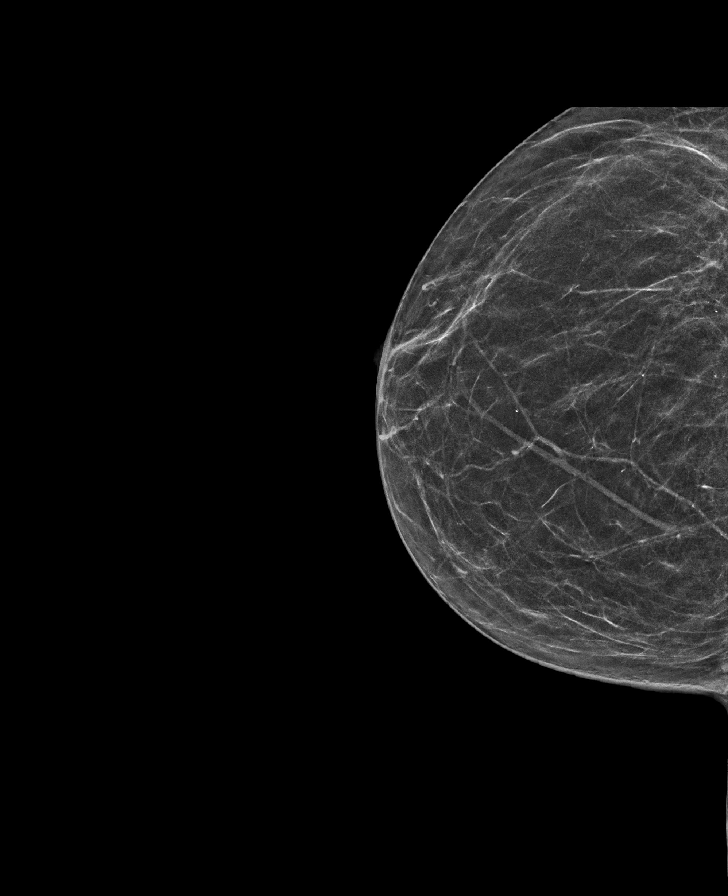

[R MLO synth-2D]
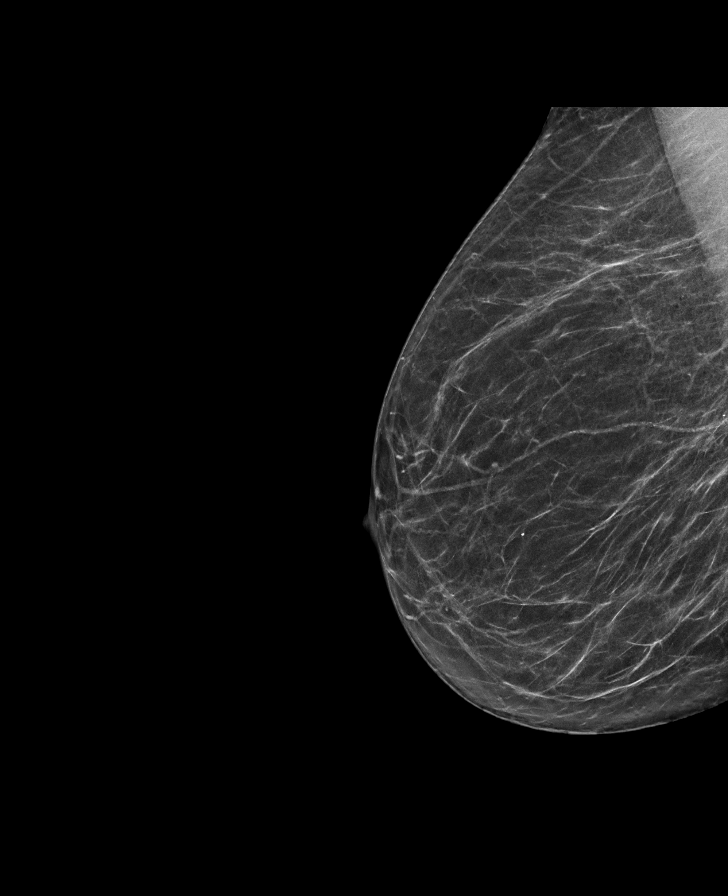

[L CC synth-2D]
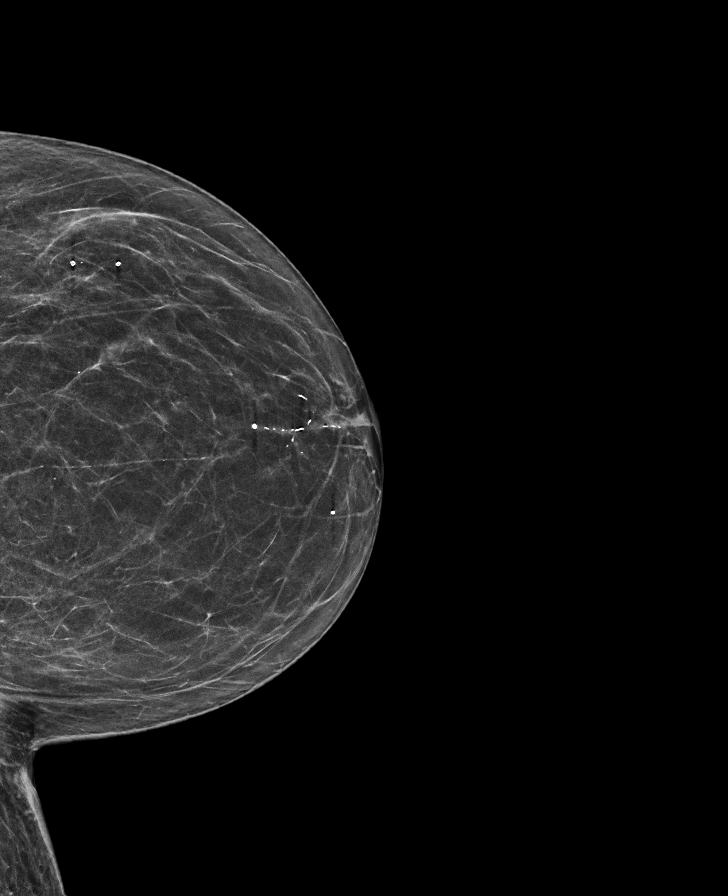

[L MLO tomo · tomo slice 29/58.0]
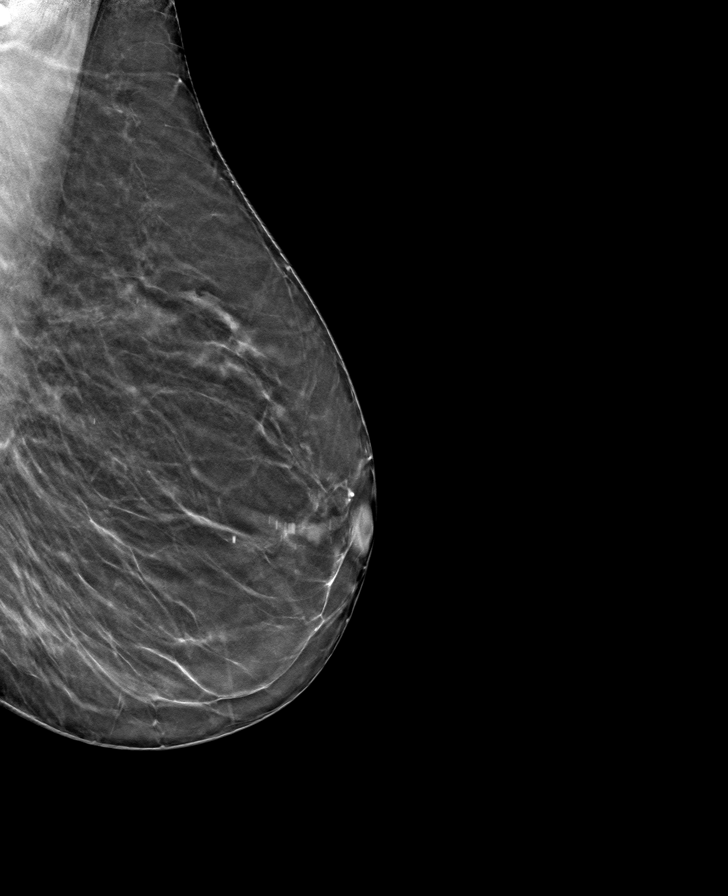

[L CC tomo · tomo slice 30/59.0]
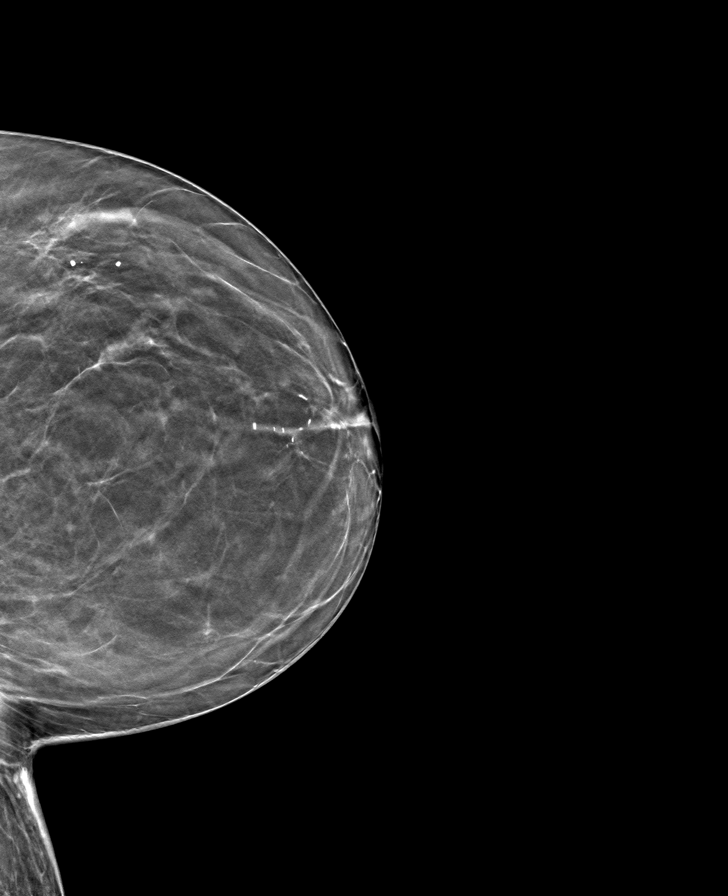

[R MLO tomo · tomo slice 31/62.0]
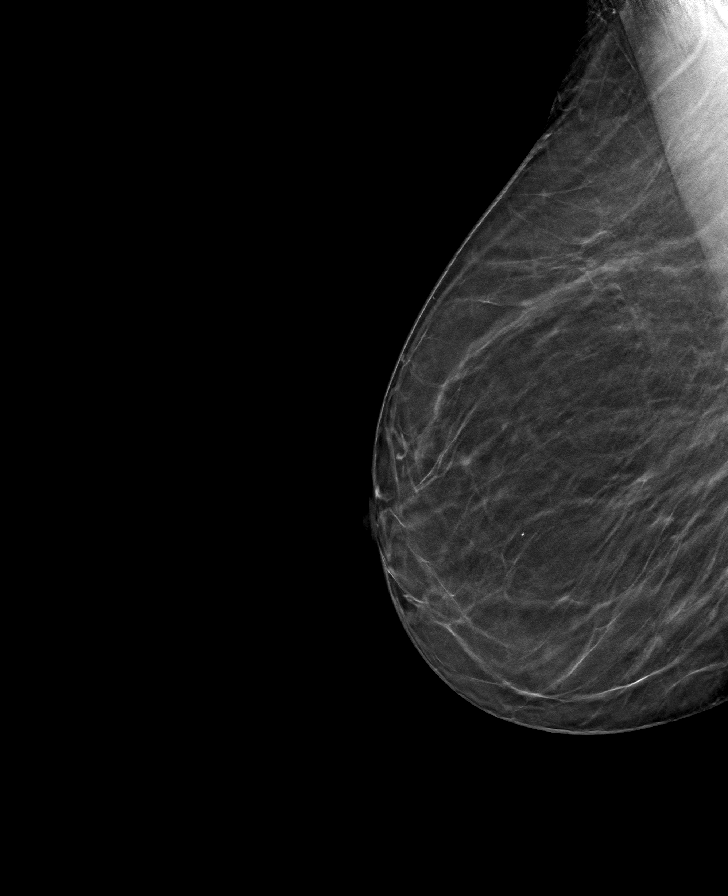

[R CC tomo · tomo slice 28/55.0]
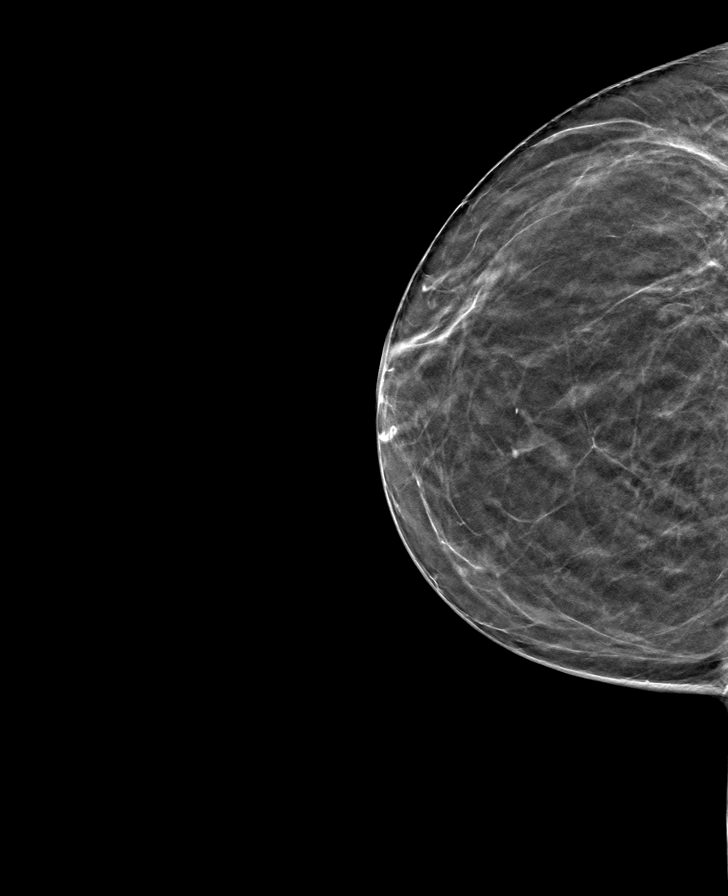

[8 of 24 positions shown; findings below may reference images not displayed]

ACR Breast Density Category b: There are scattered areas of
fibroglandular density.
FINDINGS: There are no findings suspicious for malignancy. Images were
processed with CAD.
IMPRESSION: No mammographic evidence of malignancy. A result letter of this
screening mammogram will be mailed directly to the patient.

RECOMMENDATION:
Screening mammogram in one year. (Code:[id number])

BI-RADS CATEGORY  1: Negative.

## 2021-03-14 DIAGNOSIS — Z794 Long term (current) use of insulin: Secondary | ICD-10-CM | POA: Diagnosis not present

## 2021-03-14 DIAGNOSIS — Z7982 Long term (current) use of aspirin: Secondary | ICD-10-CM | POA: Diagnosis not present

## 2021-03-14 DIAGNOSIS — Z79899 Other long term (current) drug therapy: Secondary | ICD-10-CM | POA: Diagnosis not present

## 2021-03-14 DIAGNOSIS — E039 Hypothyroidism, unspecified: Secondary | ICD-10-CM | POA: Diagnosis not present

## 2021-03-14 DIAGNOSIS — E785 Hyperlipidemia, unspecified: Secondary | ICD-10-CM | POA: Diagnosis not present

## 2021-03-14 DIAGNOSIS — Z9181 History of falling: Secondary | ICD-10-CM | POA: Diagnosis not present

## 2021-03-14 DIAGNOSIS — E1169 Type 2 diabetes mellitus with other specified complication: Secondary | ICD-10-CM | POA: Diagnosis not present

## 2021-03-14 DIAGNOSIS — E1142 Type 2 diabetes mellitus with diabetic polyneuropathy: Secondary | ICD-10-CM | POA: Diagnosis not present

## 2021-03-14 DIAGNOSIS — W109XXD Fall (on) (from) unspecified stairs and steps, subsequent encounter: Secondary | ICD-10-CM | POA: Diagnosis not present

## 2021-03-14 DIAGNOSIS — M80061D Age-related osteoporosis with current pathological fracture, right lower leg, subsequent encounter for fracture with routine healing: Secondary | ICD-10-CM | POA: Diagnosis not present

## 2021-03-14 DIAGNOSIS — H409 Unspecified glaucoma: Secondary | ICD-10-CM | POA: Diagnosis not present

## 2021-03-14 DIAGNOSIS — I10 Essential (primary) hypertension: Secondary | ICD-10-CM | POA: Diagnosis not present

## 2021-04-04 DIAGNOSIS — E1142 Type 2 diabetes mellitus with diabetic polyneuropathy: Secondary | ICD-10-CM | POA: Diagnosis not present

## 2021-04-04 DIAGNOSIS — I10 Essential (primary) hypertension: Secondary | ICD-10-CM | POA: Diagnosis not present

## 2021-04-04 DIAGNOSIS — E1169 Type 2 diabetes mellitus with other specified complication: Secondary | ICD-10-CM | POA: Diagnosis not present

## 2021-04-04 DIAGNOSIS — E039 Hypothyroidism, unspecified: Secondary | ICD-10-CM | POA: Diagnosis not present

## 2021-04-04 DIAGNOSIS — Z7982 Long term (current) use of aspirin: Secondary | ICD-10-CM | POA: Diagnosis not present

## 2021-04-04 DIAGNOSIS — Z9181 History of falling: Secondary | ICD-10-CM | POA: Diagnosis not present

## 2021-04-04 DIAGNOSIS — H409 Unspecified glaucoma: Secondary | ICD-10-CM | POA: Diagnosis not present

## 2021-04-04 DIAGNOSIS — M80061D Age-related osteoporosis with current pathological fracture, right lower leg, subsequent encounter for fracture with routine healing: Secondary | ICD-10-CM | POA: Diagnosis not present

## 2021-04-04 DIAGNOSIS — W109XXD Fall (on) (from) unspecified stairs and steps, subsequent encounter: Secondary | ICD-10-CM | POA: Diagnosis not present

## 2021-04-04 DIAGNOSIS — Z794 Long term (current) use of insulin: Secondary | ICD-10-CM | POA: Diagnosis not present

## 2021-04-04 DIAGNOSIS — Z79899 Other long term (current) drug therapy: Secondary | ICD-10-CM | POA: Diagnosis not present

## 2021-04-04 DIAGNOSIS — E785 Hyperlipidemia, unspecified: Secondary | ICD-10-CM | POA: Diagnosis not present

## 2021-04-19 ENCOUNTER — Other Ambulatory Visit: Payer: Self-pay | Admitting: Endocrinology

## 2021-04-21 NOTE — Telephone Encounter (Signed)
Is this okay to refill please advise

## 2021-05-02 ENCOUNTER — Other Ambulatory Visit: Payer: Self-pay | Admitting: Endocrinology

## 2021-05-08 ENCOUNTER — Telehealth: Payer: Self-pay

## 2021-05-08 NOTE — Telephone Encounter (Signed)
Spoke with patient about Home Health visits and lack of attendance to her follow up appointments at [facility] with PCP to maintain [place] visits. The patient stated she's been having a difficult year with her own health because of a broken leg. Her daughter also has lung and brain cancer and is receiving treatment. She expressed interest in continuing being a patient here and would like to be seen soon but is ensure when she will be able to come in. I offered her a telehealth appointment so she may speak with Herb Grays and she agreed. Suezanne Jacquet will contact her to schedule soonest available. She understood the importance of follow up and apologized for missing her appointment.

## 2021-05-12 ENCOUNTER — Encounter: Payer: Self-pay | Admitting: Nurse Practitioner

## 2021-05-12 ENCOUNTER — Ambulatory Visit (INDEPENDENT_AMBULATORY_CARE_PROVIDER_SITE_OTHER): Payer: HMO | Admitting: Nurse Practitioner

## 2021-05-12 VITALS — Wt 129.0 lb

## 2021-05-12 DIAGNOSIS — E063 Autoimmune thyroiditis: Secondary | ICD-10-CM | POA: Diagnosis not present

## 2021-05-12 DIAGNOSIS — E1159 Type 2 diabetes mellitus with other circulatory complications: Secondary | ICD-10-CM

## 2021-05-12 DIAGNOSIS — E1142 Type 2 diabetes mellitus with diabetic polyneuropathy: Secondary | ICD-10-CM | POA: Diagnosis not present

## 2021-05-12 DIAGNOSIS — E785 Hyperlipidemia, unspecified: Secondary | ICD-10-CM

## 2021-05-12 DIAGNOSIS — E1169 Type 2 diabetes mellitus with other specified complication: Secondary | ICD-10-CM | POA: Diagnosis not present

## 2021-05-12 DIAGNOSIS — I152 Hypertension secondary to endocrine disorders: Secondary | ICD-10-CM | POA: Diagnosis not present

## 2021-05-12 NOTE — Progress Notes (Signed)
.Virtual Visit via Telephone Note  I connected with Jennifer Bailey on 05/12/21 at  9:30 AM EDT by telephone and verified that I am speaking with the correct person using two identifiers.  Location: Patient: home  Provider: [place] primary care at [facility]     I discussed the limitations, risks, security and privacy concerns of performing an evaluation and management service by telephone and the availability of in person appointments. I also discussed with the patient that there may be a patient responsible charge related to this service. The patient expressed understanding and agreed to proceed.   History of Present Illness: The patient presents with history of insulin-dependent diabetes, high blood pressure, high cholesterol, and hypothyroid.  She does see an endocrinologist for both diabetes and hypothyroid.  She is scheduled to see them in near future and has labs already ordered.  She states that her health care has been "spotty" as she broke her leg in February, requiring surgical repair and rehab for several weeks.  She also has her daughter living with her who has lung cancer which has metastasized to her brain.  Patient's daughter is now going to chemotherapy and radiation treatments.  Tired much of the time.  Patient states she is also tired a great deal of the time.  Tries to stay as active as possible.  She states she is able to get around her home pretty well, does use a walking cane when in public.  Does not try to sit for long periods of time.  Her left leg gets very stiff if she sits for long period of time.  Patient does state that blood sugars going.  Can be as high as 200 during the day, but meter will go off during the night with a reading of 55.  Discussed consuming snacks drier and protein prior to going to bed to keep blood sugars stable.  She states she is eating better crackers prior to going to bed but stopped doing that since she broke her leg.  States she will go back  to doing that.  She states she is otherwise doing pretty well.  Blood pressure is well managed.  She denies chest pain, chest pressure, or shortness of breath. She denies headaches or visual disturbances. She denies abdominal pain, nausea, vomiting, or changes in bowel or bladder habits.  She states she does not need refills of any medications at this time.  She is due for a Medicare well visit in the near future.   Observations/Objective:   The patient is alert and oriented. She is pleasant and answers all questions appropriately. Breathing is non-labored. She is in no acute distress at this time.    Today's Vitals   05/12/21 0940  Weight: 129 lb (58.5 kg)   Body mass index is 26.96 kg/m.   Assessment and Plan: 1. Hypertension associated with diabetes ([place])-  on ACE/ARB due to diabetes Patient states blood pressure is doing very well.  Continue medication as prescribed.  2. Hyperlipidemia associated with type 2 diabetes mellitus ([place])-  on statin due to diabetes dx Most recent lipid panel done in May 2022 was within normal limits.  Continue statin prescribed.  3. Diabetic polyneuropathy associated with type 2 diabetes mellitus [facility]) Patient is seeing endocrinologist for control of diabetes.  Takes gabapentin to help with polyneuropathy.  She should continue as prescribed.  4. Acquired autoimmune hypothyroidism Most recent thyroid panel.  Due to have recheck of thyroid panel at next visit with  endocrinology.  Results.  Follow Up Instructions:    I discussed the assessment and treatment plan with the patient. The patient was provided an opportunity to ask questions and all were answered. The patient agreed with the plan and demonstrated an understanding of the instructions.   The patient was advised to call back or seek an in-person evaluation if the symptoms worsen or if the condition fails to improve as anticipated.  I provided 15 minutes of non-face-to-face time during this  encounter.   Ronnell Freshwater, NP

## 2021-05-19 ENCOUNTER — Other Ambulatory Visit: Payer: Self-pay

## 2021-05-19 ENCOUNTER — Other Ambulatory Visit (INDEPENDENT_AMBULATORY_CARE_PROVIDER_SITE_OTHER): Payer: HMO

## 2021-05-19 ENCOUNTER — Telehealth: Payer: Self-pay | Admitting: Endocrinology

## 2021-05-19 DIAGNOSIS — E063 Autoimmune thyroiditis: Secondary | ICD-10-CM | POA: Diagnosis not present

## 2021-05-19 DIAGNOSIS — Z794 Long term (current) use of insulin: Secondary | ICD-10-CM

## 2021-05-19 DIAGNOSIS — E1165 Type 2 diabetes mellitus with hyperglycemia: Secondary | ICD-10-CM

## 2021-05-19 LAB — COMPREHENSIVE METABOLIC PANEL
ALT: 13 U/L (ref 0–35)
AST: 20 U/L (ref 0–37)
Albumin: 4 g/dL (ref 3.5–5.2)
Alkaline Phosphatase: 81 U/L (ref 39–117)
BUN: 22 mg/dL (ref 6–23)
CO2: 27 mEq/L (ref 19–32)
Calcium: 9.4 mg/dL (ref 8.4–10.5)
Chloride: 104 mEq/L (ref 96–112)
Creatinine, Ser: 0.8 mg/dL (ref 0.40–1.20)
GFR: 67.9 mL/min (ref >60.00)
Glucose, Bld: 183 mg/dL — ABNORMAL HIGH (ref 70–99)
Potassium: 4.3 mEq/L (ref 3.5–5.1)
Sodium: 138 mEq/L (ref 135–145)
Total Bilirubin: 0.5 mg/dL (ref 0.2–1.2)
Total Protein: 6.8 g/dL (ref 6.0–8.3)

## 2021-05-19 LAB — MICROALBUMIN / CREATININE URINE RATIO
Creatinine,U: 53.4 mg/dL
Microalb Creat Ratio: 2.8 mg/g (ref 0.0–30.0)
Microalb, Ur: 1.5 mg/dL (ref 0.0–1.9)

## 2021-05-19 LAB — TSH: TSH: 2.84 u[IU]/mL (ref 0.35–5.50)

## 2021-05-19 LAB — HEMOGLOBIN A1C: Hgb A1c MFr Bld: 7.1 % — ABNORMAL HIGH (ref 4.6–6.5)

## 2021-05-19 LAB — T4, FREE: Free T4: 1.14 ng/dL (ref 0.60–1.60)

## 2021-05-19 NOTE — Telephone Encounter (Signed)
Pt advised she can do a call in the morning of her appt date 05/26/21 but before 9 or 10 am that morning if possible. Please advise.

## 2021-05-19 NOTE — Telephone Encounter (Signed)
Changed her app to telephone call on 05/27/2021

## 2021-05-19 NOTE — Telephone Encounter (Signed)
Patient came in for her blood work on 05/19/2021. States that she is unable to make app on 05/26/2021 due to her daughter having lung and brain cancer and she has to take her to one of her app's. Pt brought in her meter to be downloaded as well as had blood work. Pt would like a call back with lab results and states that we can leave her a voicemail if she does not answer on what Dr.Kumar states. Pt wants be to delete app for 05/26/2021.

## 2021-05-19 NOTE — Telephone Encounter (Signed)
Pt's meter was downloaded this morning when she came in for labs.

## 2021-05-26 ENCOUNTER — Ambulatory Visit: Payer: HMO | Admitting: Endocrinology

## 2021-05-27 ENCOUNTER — Telehealth (INDEPENDENT_AMBULATORY_CARE_PROVIDER_SITE_OTHER): Payer: HMO | Admitting: Endocrinology

## 2021-05-27 ENCOUNTER — Other Ambulatory Visit: Payer: Self-pay

## 2021-05-27 ENCOUNTER — Encounter: Payer: Self-pay | Admitting: Endocrinology

## 2021-05-27 DIAGNOSIS — E1165 Type 2 diabetes mellitus with hyperglycemia: Secondary | ICD-10-CM | POA: Diagnosis not present

## 2021-05-27 DIAGNOSIS — E7800 Pure hypercholesterolemia, unspecified: Secondary | ICD-10-CM

## 2021-05-27 DIAGNOSIS — E559 Vitamin D deficiency, unspecified: Secondary | ICD-10-CM

## 2021-05-27 DIAGNOSIS — I10 Essential (primary) hypertension: Secondary | ICD-10-CM | POA: Diagnosis not present

## 2021-05-27 DIAGNOSIS — Z794 Long term (current) use of insulin: Secondary | ICD-10-CM | POA: Diagnosis not present

## 2021-05-27 MED ORDER — LEVOTHYROXINE SODIUM 88 MCG PO TABS: 88.0000 ug | ORAL_TABLET | Freq: Every day | ORAL | 3 refills | Status: DC

## 2021-05-27 NOTE — Progress Notes (Signed)
Patient ID: Jennifer Bailey, female   DOB: 19-Jul-1935, 86 y.o.   MRN: [id number]  Today's office visit was provided via telemedicine using a telephone call to the patient Patient has been explained the limitations of evaluation and management by telemedicine and the availability of in person appointments.  The patient understood the limitations and agreed to proceed. Patient also understood that the telehealth visit is billable.  Location of the patient: Home  Location of the provider: Office Only the patient and myself were participating in the encounter   Chief complaint: follow up    History of Present Illness     Type 2 DIABETES MELITUS, date of diagnosis: 2003  She has been on insulin for several years but continues to have labile blood sugars despite using basal bolus regimen  She is very compliant with her insulin at mealtimes  Is taking small amounts of insulin and is not on any oral hypoglycemics She was changed from Levemir to [place] insulin in 1/17 because of relatively inconsistent control   RECENT history:   Insulin regimen: Tresiba 11 units in a.m.;   mealtime NovoLog coverage: At breakfast:  3 units, 3  lunch and 2-3 at supper                                  Current blood sugar patterns from freestyle libre, diabetes management and problems identified:  Daily management, problems identified and abnormal patterns are discussed in detail in the CGM analysis   Her [name] is improved at 7.1 compared to 7.9   Again her freestyle Elenor Legato seems to be reading falsely low at times  Still has not checked her fingersticks at the same time  Also not usually symptomatic when she has low blood sugars on her Elenor Legato  As discussed in the CGM interpretation her postprandial readings are quite variable  This is despite her trying to take the NovoLog consistently before eating  However she is mostly adjusting her NovoLog based on [place] blood sugar other than the amount  of carbohydrate  She also now says that she will take about 4 to 5 units postprandially at night if the blood sugars are over 200 causing some hypoglycemia  She has not been able to be active with still not recovering after her leg fracture  Fasting readings are fairly good although occasionally has had low normal or slightly low readings overnight and before dinner  Interpretation of her CGM from the freestyle libre 2 for the last 2 weeks  Her meter is programmed to the wrong date and reading September 05, 2026  Blood sugars are overall within the target range most of the day HIGHEST blood sugars after breakfast  However she has significant variability in blood sugars after breakfast and also before and after dinnertime  OVERNIGHT blood sugars are generally fairly well controlled with some variability; has had low sugars between 11 PM-3 AM occasionally  HYPOGLYCEMIA has occurred occasionally overnight can also after her evening meal and bedtime at times  Hypoglycemia is periodically preceded by high readings especially after dinner POSTPRANDIAL readings are overall well controlled but sometimes unusually high after especially breakfast  Hyperglycemia also may occur as a rebound after low normal blood sugars  Lowest blood sugars are between 2-4 AM as before She has not compared freestyle libre readings with fingersticks and lab glucose appears to be relatively higher compared to her home readings at  the same time  CGM use % of time 97  2-week average/GV 137  Time in range       77 %  % Time Above 180 18+1  % Time above 250   % Time Below 70 4     PRE-MEAL Fasting Lunch Dinner Bedtime Overall  Glucose range:       Averages: 135 149 151 127 137   POST-MEAL PC Breakfast PC Lunch PC Dinner  Glucose range:     Averages: 161 145 141   Previously:  CGM use % of time  97  2-week average/GV  137  Time in range    85    %  % Time Above 180  15  % Time above 250   % Time Below 70  <1      PRE-MEAL Fasting Lunch Dinner Bedtime Overall  Glucose range:       Averages:  132  133  147     POST-MEAL PC Breakfast PC Lunch PC Dinner  Glucose range:     Averages:  158  163  127    Symptoms with hypoglycemia: feels weak and shaky Low sugars are treated with Soft drink or glucose tablets      Meals: 3 meals per day.  Usually has small portions. Eggs/bread or one pancake with protein at 6:30 AM-7 am; has mid morning snack.  Has hs snack if blood sugar not high Evening meal usually balanced at 5 pm with 1-2 carbohydrates  Physical activity: exercise: Some walking and yardwork         Dietician visit: Most recent: 2004            Wt Readings from Last 3 Encounters:  05/12/21 129 lb (58.5 kg)  02/17/21 129 lb 6.4 oz (58.7 kg)  12/02/20 128 lb ([id number] kg)   Complications: are: Mild neuropathy controlled symptomatically  Lab Results  Component Value Date   HGBA1C 7.1 (H) 05/19/2021   HGBA1C 7.9 (H) 02/10/2021   HGBA1C 7.3 (H) 12/02/2020   Lab Results  Component Value Date   MICROALBUR 1.5 05/19/2021   LDLCALC 62 02/10/2021   CREATININE 0.80 05/19/2021    HYPERTENSION:  this has been mild and treated with 50 mg losartan.  She is checking her blood pressure at home and it is usually normal, does not remember her readings   BP Readings from Last 3 Encounters:  02/17/21 134/68  12/10/20 (!) 153/42  08/26/20 124/82    OSTEOPENIA:  She has had osteopenia with T score -2.3 at the hip in 3/09 and was started on Fosamax at that time. This was stopped in 02/2014.   Last bone density in 2016: DualFemur Neck Right 10/03/2015    T score    -1.9  She had a traumatic fracture of her tibial plateau on 12/02/2020 Has not scheduled her bone density as ordered still  History of vitamin D Deficiency, she is taking 50,000 units monthly with adequate levels  Lab Results  Component Value Date   VD25OH 50.06 02/10/2021   VD25OH 60.16 02/06/2019    OTHER active problems:  See review of systems   LABS:    No visits with results within 1 Week(s) from this visit.  Latest known visit with results is:  Lab on 05/19/2021  Component Date Value Ref Range Status   Microalb, Ur 05/19/2021 1.5  0.0 - 1.9 mg/dL Final   Creatinine,U 05/19/2021 53.4  mg/dL Final   Microalb Creat Ratio 05/19/2021 2.8  0.0 - 30.0 mg/g Final   Free T4 05/19/2021 1.14  0.60 - 1.60 ng/dL Final   Comment: Specimens from patients who are undergoing biotin therapy and /or ingesting biotin supplements may contain high levels of biotin.  The higher biotin concentration in these specimens interferes with this Free T4 assay.  Specimens that contain high levels  of biotin may cause false high results for this Free T4 assay.  Please interpret results in light of the total clinical presentation of the patient.     TSH 05/19/2021 2.84  0.35 - 5.50 uIU/mL Final   Sodium 05/19/2021 138  135 - 145 mEq/L Final   Potassium 05/19/2021 4.3  3.5 - 5.1 mEq/L Final   Chloride 05/19/2021 104  96 - 112 mEq/L Final   CO2 05/19/2021 27  19 - 32 mEq/L Final   Glucose, Bld 05/19/2021 183 (A) 70 - 99 mg/dL Final   BUN 05/19/2021 22  6 - 23 mg/dL Final   Creatinine, Ser 05/19/2021 0.80  0.40 - 1.20 mg/dL Final   Total Bilirubin 05/19/2021 0.5  0.2 - 1.2 mg/dL Final   Alkaline Phosphatase 05/19/2021 81  39 - 117 U/L Final   AST 05/19/2021 20  0 - 37 U/L Final   ALT 05/19/2021 13  0 - 35 U/L Final   Total Protein 05/19/2021 6.8  6.0 - 8.3 g/dL Final   Albumin 05/19/2021 4.0  3.5 - 5.2 g/dL Final   GFR 05/19/2021 67.90  >60.00 mL/min Final   Calculated using the CKD-EPI Creatinine Equation (2021)   Calcium 05/19/2021 9.4  8.4 - 10.5 mg/dL Final   Hgb [name] MFr Bld 05/19/2021 7.1 (A) 4.6 - 6.5 % Final   Glycemic Control Guidelines for People with Diabetes:Non Diabetic:  <6%Goal of Therapy: <7%Additional Action Suggested:  >8%     Allergies as of 05/27/2021       Reactions   Macrodantin [nitrofurantoin  Macrocrystal] Swelling   Prednisone    Make real "shaky-like, nervious"   Penicillins Rash   Has patient had a PCN reaction causing immediate rash, facial/tongue/throat swelling, SOB or lightheadedness with hypotension:Yes Has patient had a PCN reaction causing severe rash involving mucus membranes or skin necrosis: No Has patient had a PCN reaction that required hospitalization: No Has patient had a PCN reaction occurring within the last 10 years:No If all of the above answers are "NO", then may proceed with Cephalosporin use.        Medication List        Accurate as of May 27, 2021  8:49 AM. If you have any questions, ask your nurse or doctor.          Alcohol Pads 70 % Pads USE 4 PER DAY   Alphagan P 0.1 % Soln Generic drug: brimonidine Place 1 drop into the left eye 2 (two) times daily.   aspirin 81 MG tablet Take 1 tablet (81 mg total) by mouth daily.   BD Pen Needle Nano U/F 32G X 4 MM Misc Generic drug: Insulin Pen Needle USE AS DIRECTED FIVE TIMES A DAY.   cycloSPORINE 0.05 % ophthalmic emulsion Commonly known as: RESTASIS 1 drop 2 (two) times daily.   dorzolamidel-timolol 22.3-6.8 MG/ML Soln ophthalmic solution Commonly known as: COSOPT Place 1 drop into the left eye 2 (two) times daily.   enoxaparin 40 MG/0.4ML injection Commonly known as: LOVENOX Inject 0.4 mLs (40 mg total) into the skin daily for 28 days.   FreeStyle Libre 14 Day Reader Kerrin Mo USE  DAILY TO CHECK BLOOD SUGAR   FreeStyle Libre 2 Sensor Misc APPLY TO ARM EVERY 14 DAYS TO MONITOR BLOOD SUGAR.   gabapentin 100 MG capsule Commonly known as: NEURONTIN TAKE 3 TO 4 CAPSULES BY MOUTH AT BEDTIME.   HYDROcodone-acetaminophen 7.5-325 MG tablet Commonly known as: NORCO Take 1 tablet by mouth every 6 (six) hours as needed for moderate pain or severe pain.   levothyroxine 75 MCG tablet Commonly known as: SYNTHROID TAKE 1 TABLET BY MOUTH DAILY BEFORE BREAKFAST, EXCEPT TAKE 1 AND 1/2  TABLETS ON SUNDAY.   losartan 50 MG tablet Commonly known as: COZAAR TAKE 1 TABLET BY MOUTH DAILY.   lovastatin 40 MG tablet Commonly known as: MEVACOR TAKE 1 TABLET BY MOUTH ONCE DAILY WITH A MEAL. What changed: See the new instructions.   NovoLOG FlexPen 100 UNIT/ML FlexPen Generic drug: insulin aspart INJECT 3 TO 5 UNITS SUBCUTANEOUSLY BEFORE MEALS AS DIRECTED. What changed: See the new instructions.   Omega-3 1000 MG Caps Take 1 capsule by mouth 4 (four) times daily.   OneTouch Verio test strip Generic drug: glucose blood USE TO TEST BLOOD SUGAR 4 TIMES DAILY   pantoprazole 40 MG tablet Commonly known as: PROTONIX Take 40 mg by mouth 2 (two) times daily as needed (indigestion).   polyethylene glycol 17 g packet Commonly known as: MIRALAX / GLYCOLAX Take 17 g by mouth daily as needed for mild constipation. If not relieved with senokot   PreserVision AREDS 2 Caps Take 1 capsule by mouth 2 (two) times daily.   senna-docusate 8.6-50 MG tablet Commonly known as: Senokot-S Take 1 tablet by mouth 2 (two) times daily. While on narcotics   Tresiba FlexTouch 100 UNIT/ML FlexTouch Pen Generic drug: insulin degludec INJECT 0.11 MLS (11 UNITS TOTAL) INTO THE SKIN DAILY. What changed: See the new instructions.   vitamin C 100 MG tablet Take 500 mg by mouth daily.   Vitamin D (Ergocalciferol) 1.25 MG (50000 UNIT) Caps capsule Commonly known as: DRISDOL TAKE 1 CAPSULE BY MOUTH ONCE A MONTH. What changed: See the new instructions.        Allergies:  Allergies  Allergen Reactions   Macrodantin [Nitrofurantoin Macrocrystal] Swelling   Prednisone     Make real "shaky-like, nervious"   Penicillins Rash    Has patient had a PCN reaction causing immediate rash, facial/tongue/throat swelling, SOB or lightheadedness with hypotension:Yes Has patient had a PCN reaction causing severe rash involving mucus membranes or skin necrosis: No Has patient had a PCN reaction that  required hospitalization: No Has patient had a PCN reaction occurring within the last 10 years:No If all of the above answers are "NO", then may proceed with Cephalosporin use.     Past Medical History:  Diagnosis Date   Diabetes mellitus without complication ([place])    Thyroid disease     Past Surgical History:  Procedure Laterality Date   ABDOMINAL HYSTERECTOMY     CHOLECYSTECTOMY     COLON SURGERY     ORIF TIBIA PLATEAU Right 12/04/2020   Procedure: OPEN REDUCTION INTERNAL FIXATION (ORIF) TIBIAL PLATEAU;  Surgeon: Shona Needles, MD;  Location: [place];  Service: Orthopedics;  Laterality: Right;    Family History  Problem Relation Age of Onset   Cancer Mother    Diabetes Brother     Social History:  reports that she has never smoked. She has never used smokeless tobacco. She reports that she does not drink alcohol and does not use drugs.   Review of  Systems   Peripheral NEUROPATHY: Treated with gabapentin taking at bedtime with no recent increase in symptoms   HYPOTHYROIDISM: She has had primary hypothyroidism, long-standing . She feels fairly good overall Her TSH is back to normal with increasing her dose by half tablet weekly, now taking 8 tablets a week of the 75 mcg  Lab Results  Component Value Date   TSH 2.84 05/19/2021   TSH 7.83 (H) 02/10/2021   TSH 3.82 08/12/2020   FREET4 1.14 05/19/2021   FREET4 1.05 02/10/2021   FREET4 1.27 04/15/2020       HYPERLIPIDEMIA: The lipid abnormality consists of elevated LDL,  taking lovastatin with consistently good control, last level below 70.  Lab Results  Component Value Date   CHOL 138 02/10/2021   HDL 50.70 02/10/2021   LDLCALC 62 02/10/2021   TRIG 126.0 02/10/2021   CHOLHDL 3 02/10/2021     She has had her Covid booster and flu shots   Examination:   There were no vitals taken for this visit.  There is no height or weight on file to calculate BMI.      ASSESSMENT/ PLAN:    Diabetes type 2 on insulin    See history of present illness for detailed discussion of current management, blood sugar patterns and problems identified  Her [name] is 7.1 and improved  She is on basal bolus insulin with relatively low insulin doses  Her blood sugars recently are looking better than expected for her [name] Her freestyle Elenor Legato is relatively inaccurate with sometimes readings as low as 30 mg before the actual reading judging from the lab However the data on her libre meter is not accurate She does not adjust her mealtime dose based on what she is eating and discussed adjusting it up or down at least 1 or 2 units based on portions and carbohydrates She will also not take more than 2 units for correction doses at night especially unless blood sugars are over 250 Recommended going down to 10 units on the [place] to avoid potential low sugars She will start doing fingersticks to compare readings with the libre  Hypertension:  Her blood pressure is controlled as measured at home She will continue 50 mg of losartan    Hypothyroidism :her TSH is back to normal and when she finishes her supply of 75 mcg she will go to 88 mcg daily   Needs bone density, she will call to schedule when she is able to  Follow-up in 4 months     There are no Patient Instructions on file for this visit.  Total visit time with the telephone call = 12 minutes  Elayne Snare 05/27/2021, 8:49 AM

## 2021-05-28 ENCOUNTER — Other Ambulatory Visit: Payer: Self-pay | Admitting: Endocrinology

## 2021-06-21 ENCOUNTER — Encounter: Payer: Self-pay | Admitting: Endocrinology

## 2021-06-28 ENCOUNTER — Other Ambulatory Visit: Payer: Self-pay | Admitting: Endocrinology

## 2021-06-30 DIAGNOSIS — H353131 Nonexudative age-related macular degeneration, bilateral, early dry stage: Secondary | ICD-10-CM | POA: Diagnosis not present

## 2021-06-30 DIAGNOSIS — H40011 Open angle with borderline findings, low risk, right eye: Secondary | ICD-10-CM | POA: Diagnosis not present

## 2021-06-30 DIAGNOSIS — H04123 Dry eye syndrome of bilateral lacrimal glands: Secondary | ICD-10-CM | POA: Diagnosis not present

## 2021-06-30 DIAGNOSIS — Z794 Long term (current) use of insulin: Secondary | ICD-10-CM | POA: Diagnosis not present

## 2021-06-30 DIAGNOSIS — H401221 Low-tension glaucoma, left eye, mild stage: Secondary | ICD-10-CM | POA: Diagnosis not present

## 2021-06-30 DIAGNOSIS — E119 Type 2 diabetes mellitus without complications: Secondary | ICD-10-CM | POA: Diagnosis not present

## 2021-06-30 LAB — HM DIABETES EYE EXAM

## 2021-07-06 ENCOUNTER — Encounter: Payer: Self-pay | Admitting: Endocrinology

## 2021-07-07 ENCOUNTER — Other Ambulatory Visit: Payer: Self-pay | Admitting: Endocrinology

## 2021-07-23 ENCOUNTER — Telehealth: Payer: Self-pay | Admitting: Endocrinology

## 2021-07-23 NOTE — Telephone Encounter (Signed)
Pt calling to request a medication that'll calm her anxiety, her daughter is dying from cancer as pt is taking of her.   [place], [place]  [place]  Pt contact [phone]

## 2021-07-24 NOTE — Telephone Encounter (Signed)
Patient notified and verbalized understanding. 

## 2021-08-07 ENCOUNTER — Other Ambulatory Visit: Payer: Self-pay

## 2021-08-07 ENCOUNTER — Ambulatory Visit (INDEPENDENT_AMBULATORY_CARE_PROVIDER_SITE_OTHER): Payer: HMO | Admitting: Physician Assistant

## 2021-08-07 ENCOUNTER — Encounter: Payer: Self-pay | Admitting: Physician Assistant

## 2021-08-07 VITALS — BP 124/67 | HR 71 | Temp 97.5°F | Ht <= 58 in | Wt 128.4 lb

## 2021-08-07 DIAGNOSIS — W19XXXA Unspecified fall, initial encounter: Secondary | ICD-10-CM | POA: Diagnosis not present

## 2021-08-07 DIAGNOSIS — F4321 Adjustment disorder with depressed mood: Secondary | ICD-10-CM

## 2021-08-07 DIAGNOSIS — R600 Localized edema: Secondary | ICD-10-CM

## 2021-08-07 MED ORDER — FUROSEMIDE 20 MG PO TABS: 20.0000 mg | ORAL_TABLET | Freq: Every day | ORAL | 1 refills | Status: DC | PRN

## 2021-08-07 MED ORDER — POTASSIUM CHLORIDE CRYS ER 20 MEQ PO TBCR: 20.0000 meq | EXTENDED_RELEASE_TABLET | Freq: Every day | ORAL | 1 refills | Status: DC | PRN

## 2021-08-07 NOTE — Patient Instructions (Signed)
Managing Loss, Adult People experience loss in many different ways throughout their lives. Events such as moving, changing jobs, and losing friends can create a sense of loss. The loss may be as serious as a major health change, divorce, death of a pet, or death of a loved one. All of these types of loss are likely to create a physical and emotional reaction known as grief. Grief is the result of a major change or an absence of something or someone that you count on. Grief is a normal reaction to loss. A variety of factors can affect your grieving experience, including: The nature of your loss. Your relationship to what or whom you lost. Your understanding of grief and how to manage it. Your support system. How to manage lifestyle changes Keep to your normal routine as much as possible. If you have trouble focusing or doing normal activities, it is acceptable to take some time away from your normal routine. Spend time with friends and loved ones. Eat a healthy diet, get plenty of sleep, and rest when you feel tired. How to recognize changes  The way that you deal with your grief will affect your ability to function as you normally do. When grieving, you may experience these changes: Numbness, shock, sadness, anxiety, anger, denial, and guilt. Thoughts about death. Unexpected crying. A physical sensation of emptiness in your stomach. Problems sleeping and eating. Tiredness (fatigue). Loss of interest in normal activities. Dreaming about or imagining seeing the person who died. A need to remember what or whom you lost. Difficulty thinking about anything other than your loss for a period of time. Relief. If you have been expecting the loss for a while, you may feel a sense of relief when it happens. Follow these instructions at home: Activity Express your feelings in healthy ways, such as: Talking with others about your loss. It may be helpful to find others who have had a similar loss, such  as a support group. Writing down your feelings in a journal. Doing physical activities to release stress and emotional energy. Doing creative activities like painting, sculpting, or playing or listening to music. Practicing resilience. This is the ability to recover and adjust after facing challenges. Reading some resources that encourage resilience may help you to learn ways to practice those behaviors.  General instructions Be patient with yourself and others. Allow the grieving process to happen, and remember that grieving takes time. It is likely that you may never feel completely done with some grief. You may find a way to move on while still cherishing memories and feelings about your loss. Accepting your loss is a process. It can take months or longer to adjust. Keep all follow-up visits as told by your health care provider. This is important. Where to find support To get support for managing loss: Ask your health care provider for help and recommendations, such as grief counseling or therapy. Think about joining a support group for people who are managing a loss. Where to find more information You can find more information about managing loss from: American Society of Clinical Oncology: www.cancer.net American Psychological Association: www.apa.org Contact a health care provider if: Your grief is extreme and keeps getting worse. You have ongoing grief that does not improve. Your body shows symptoms of grief, such as illness. You feel depressed, anxious, or lonely. Get help right away if: You have thoughts about hurting yourself or others. If you ever feel like you may hurt yourself or others, or have   thoughts about taking your own life, get help right away. You can go to your nearest emergency department or call: Your local emergency services (911 in the [place] A suicide crisis helpline, such as the National Suicide Prevention Lifeline at 1-800-273-8255. This is open 24 hours a  day. Summary Grief is the result of a major change or an absence of someone or something that you count on. Grief is a normal reaction to loss. The depth of grief and the period of recovery depend on the type of loss and your ability to adjust to the change and process your feelings. Processing grief requires patience and a willingness to accept your feelings and talk about your loss with people who are supportive. It is important to find resources that work for you and to realize that people experience grief differently. There is not one grieving process that works for everyone in the same way. Be aware that when grief becomes extreme, it can lead to more severe issues like isolation, depression, anxiety, or suicidal thoughts. Talk with your health care provider if you have any of these issues. This information is not intended to replace advice given to you by your health care provider. Make sure you discuss any questions you have with your health care provider. Document Revised: 03/14/2020 Document Reviewed: 03/14/2020 Elsevier Patient Education  2022 [employer] Inc.  

## 2021-08-07 NOTE — Progress Notes (Signed)
Established Patient Office Visit  Subjective:  Patient ID: Jennifer Bailey, female    DOB: 10-Feb-1935  Age: 86 y.o. MRN: [id number]  CC: No chief complaint on file.   HPI Jennifer Bailey presents to discuss having a difficult time coping with the recent passing away of her daughter who unfortunately passed away from metastatic brain cancer. States she is coping better. She does a good support system with friends, her two daughters and neighbors. Patient reports it is more challenging to cope when she is home, patient lives alone. Also reports this morning she fell over her living room chair and landed on her knees. Unsure how she fell, denies tripping or losing balance. Having swelling around her knees. Also reports has chronic swelling of lower legs, more on her right leg since right knee surgery. Denies pain or stiffness.Patient does use a walker for ambulation. Denies other falls within the past year.   Past Medical History:  Diagnosis Date   Diabetes mellitus without complication ([place])    Thyroid disease     Past Surgical History:  Procedure Laterality Date   ABDOMINAL HYSTERECTOMY     CHOLECYSTECTOMY     COLON SURGERY     ORIF TIBIA PLATEAU Right 12/04/2020   Procedure: OPEN REDUCTION INTERNAL FIXATION (ORIF) TIBIAL PLATEAU;  Surgeon: Shona Needles, MD;  Location: [facility];  Service: Orthopedics;  Laterality: Right;    Family History  Problem Relation Age of Onset   Cancer Mother    Diabetes Brother     Social History   Socioeconomic History   Marital status: Married    Spouse name: Not on file   Number of children: Not on file   Years of education: Not on file   Highest education level: Not on file  Occupational History   Not on file  Tobacco Use   Smoking status: Never   Smokeless tobacco: Never  Vaping Use   Vaping Use: Never used  Substance and Sexual Activity   Alcohol use: Never   Drug use: Never   Sexual activity: Not on file  Other Topics Concern    Not on file  Social History Narrative   Not on file   Social Determinants of Health   Financial Resource Strain: Not on file  Food Insecurity: Not on file  Transportation Needs: Not on file  Physical Activity: Not on file  Stress: Not on file  Social Connections: Not on file  Intimate Partner Violence: Not on file    Outpatient Medications Prior to Visit  Medication Sig Dispense Refill   Alcohol Swabs (ALCOHOL PADS) 70 % PADS USE 4 PER DAY 400 each 3   Ascorbic Acid (VITAMIN C) 100 MG tablet Take 500 mg by mouth daily.      aspirin 81 MG tablet Take 1 tablet (81 mg total) by mouth daily.     BD PEN NEEDLE NANO U/F 32G X 4 MM MISC USE AS DIRECTED FIVE TIMES A DAY. 200 each 5   brimonidine (ALPHAGAN P) 0.1 % SOLN Place 1 drop into the left eye 2 (two) times daily.     Continuous Blood Gluc Receiver (FREESTYLE LIBRE 14 DAY READER) DEVI USE DAILY TO CHECK BLOOD SUGAR 1 each 0   Continuous Blood Gluc Sensor (FREESTYLE LIBRE 2 SENSOR) MISC APPLY TO ARM EVERY 14 DAYS TO MONITOR BLOOD SUGAR. 2 each 3   cycloSPORINE (RESTASIS) 0.05 % ophthalmic emulsion 1 drop 2 (two) times daily.     dorzolamidel-timolol (COSOPT)  22.3-6.8 MG/ML SOLN ophthalmic solution Place 1 drop into the left eye 2 (two) times daily.     gabapentin (NEURONTIN) 100 MG capsule TAKE 3 TO 4 CAPSULES BY MOUTH AT BEDTIME. 120 capsule 3   levothyroxine (SYNTHROID) 88 MCG tablet Take 1 tablet (88 mcg total) by mouth daily. 90 tablet 3   losartan (COZAAR) 50 MG tablet TAKE 1 TABLET BY MOUTH DAILY. (Patient taking differently: Take 50 mg by mouth daily.) 90 tablet 3   lovastatin (MEVACOR) 40 MG tablet TAKE 1 TABLET BY MOUTH ONCE DAILY WITH A MEAL. 90 tablet 4   Multiple Vitamins-Minerals (PRESERVISION AREDS 2) CAPS Take 1 capsule by mouth 2 (two) times daily.     NOVOLOG FLEXPEN 100 UNIT/ML FlexPen INJECT 3 TO 5 UNITS SUBCUTANEOUSLY BEFORE MEALS AS DIRECTED. 15 mL 3   Omega-3 1000 MG CAPS Take 1 capsule by mouth 4 (four) times  daily.     ONETOUCH VERIO test strip USE TO TEST BLOOD SUGAR 4 TIMES DAILY 150 strip 2   pantoprazole (PROTONIX) 40 MG tablet Take 40 mg by mouth 2 (two) times daily as needed (indigestion).     polyethylene glycol (MIRALAX / GLYCOLAX) 17 g packet Take 17 g by mouth daily as needed for mild constipation. If not relieved with senokot  0   senna-docusate (SENOKOT-S) 8.6-50 MG tablet Take 1 tablet by mouth 2 (two) times daily. While on narcotics     TRESIBA FLEXTOUCH 100 UNIT/ML FlexTouch Pen INJECT 0.11 MLS (11 UNITS TOTAL) INTO THE SKIN DAILY. (Patient taking differently: Inject 11 Units into the skin daily.) 15 mL 3   Vitamin D, Ergocalciferol, (DRISDOL) 1.25 MG (50000 UNIT) CAPS capsule TAKE 1 CAPSULE BY MOUTH ONCE A MONTH. (Patient taking differently: Take 50,000 Units by mouth every 7 (seven) days.) 12 capsule 2   enoxaparin (LOVENOX) 40 MG/0.4ML injection Inject 0.4 mLs (40 mg total) into the skin daily for 28 days. 11.2 mL 0   HYDROcodone-acetaminophen (NORCO) 7.5-325 MG tablet Take 1 tablet by mouth every 6 (six) hours as needed for moderate pain or severe pain. 15 tablet 0   No facility-administered medications prior to visit.    Allergies  Allergen Reactions   Macrodantin [Nitrofurantoin Macrocrystal] Swelling   Prednisone     Make real "shaky-like, nervious"   Penicillins Rash    Has patient had a PCN reaction causing immediate rash, facial/tongue/throat swelling, SOB or lightheadedness with hypotension:Yes Has patient had a PCN reaction causing severe rash involving mucus membranes or skin necrosis: No Has patient had a PCN reaction that required hospitalization: No Has patient had a PCN reaction occurring within the last 10 years:No If all of the above answers are "NO", then may proceed with Cephalosporin use.     ROS Review of Systems Review of Systems:  A fourteen system review of systems was performed and found to be positive as per HPI.   Objective:    Physical  Exam General:  Well Developed, well nourished, appropriate for stated age.  Neuro:  Alert and oriented,  extra-ocular muscles intact  HEENT:  Normocephalic, atraumatic, neck supple, no carotid bruits appreciated  Skin: warm, pink. Small bruise of left medial knee.  Cardiac:  RRR, S1 S2 Respiratory: CTA B/L, Not using accessory muscles, speaking in full sentences- unlabored. MSK: Well healed surgical scar of right knee present, bilateral knee swelling, no deformity noted, no tenderness, overall good ROM, no pain with Varus or Valgus. Vascular:  Ext warm, no cyanosis apprec.; cap RF less  2 sec. 2+ pitting edema of RLE, 1+ pitting edema of LLE Psych:  No HI/SI, judgement and insight good, emotional- mood. Full Affect.  BP 124/67   Pulse 71   Temp (!) 97.5 F (36.4 C)   Ht 4\' 10"  (1.473 m)   Wt 128 lb 6.4 oz (58.2 kg)   SpO2 100%   BMI 26.84 kg/m  Wt Readings from Last 3 Encounters:  08/07/21 128 lb 6.4 oz (58.2 kg)  05/12/21 129 lb (58.5 kg)  02/17/21 129 lb 6.4 oz (58.7 kg)     Health Maintenance Due  Topic Date Due   TETANUS/TDAP  Never done   Zoster Vaccines- Shingrix (2 of 2) 09/02/2020   COVID-19 Vaccine (4 - Booster for Pfizer series) 09/19/2020   FOOT EXAM  12/17/2020   INFLUENZA VACCINE  05/05/2021    There are no preventive care reminders to display for this patient.  Lab Results  Component Value Date   TSH 2.84 05/19/2021   Lab Results  Component Value Date   WBC 10.0 12/10/2020   HGB 10.3 (L) 12/10/2020   HCT 30.9 (L) 12/10/2020   MCV 91.2 12/10/2020   PLT 366 12/10/2020   Lab Results  Component Value Date   NA 138 05/19/2021   K 4.3 05/19/2021   CO2 27 05/19/2021   GLUCOSE 183 (H) 05/19/2021   BUN 22 05/19/2021   CREATININE 0.80 05/19/2021   BILITOT 0.5 05/19/2021   ALKPHOS 81 05/19/2021   AST 20 05/19/2021   ALT 13 05/19/2021   PROT 6.8 05/19/2021   ALBUMIN 4.0 05/19/2021   CALCIUM 9.4 05/19/2021   ANIONGAP 8 12/08/2020   GFR 67.90  05/19/2021   Lab Results  Component Value Date   CHOL 138 02/10/2021   Lab Results  Component Value Date   HDL 50.70 02/10/2021   Lab Results  Component Value Date   LDLCALC 62 02/10/2021   Lab Results  Component Value Date   TRIG 126.0 02/10/2021   Lab Results  Component Value Date   CHOLHDL 3 02/10/2021   Lab Results  Component Value Date   HGBA1C 7.1 (H) 05/19/2021   Depression screen PHQ 2/9 05/12/2021 04/22/2020 12/13/2019 10/30/2019 01/27/2019  Decreased Interest 3 3 0 0 0  Down, Depressed, Hopeless 0 0 0 0 0  PHQ - 2 Score 3 3 0 0 0  Altered sleeping 0 0 1 - -  Tired, decreased energy 1 2 0 - -  Change in appetite 0 0 0 - -  Feeling bad or failure about yourself  0 0 0 - -  Trouble concentrating 0 0 0 - -  Moving slowly or fidgety/restless 1 0 0 - -  Suicidal thoughts 0 0 0 - -  PHQ-9 Score 5 5 1  - -  Difficult doing work/chores - Not difficult at all Not difficult at all - -   GAD 7 : Generalized Anxiety Score 05/12/2021  Nervous, Anxious, on Edge 1  Control/stop worrying 1  Worry too much - different things 1  Trouble relaxing 0  Restless 0  Easily annoyed or irritable 0  Afraid - awful might happen 0  Total GAD 7 Score 3        Assessment & Plan:   Problem List Items Addressed This Visit   None Visit Diagnoses     Bilateral lower extremity edema    -  Primary   Relevant Medications   furosemide (LASIX) 20 MG tablet   potassium chloride SA (KLOR-CON)  4 MEQ tablet   Grief       Fall, initial encounter          Grief: -Discussed with patient management options including grief counseling, reports doing better. Advised to let me know if decides to pursue referral. Recommend to continue to use good support system at home.  Fall, initial encounter: -Discussed with patient no red flag s/sx concerning for acute fracture present at this time, likely b/l knee contusion. Discussed RICE therapy. If symptoms fail to improve or worsen recommend obtaining  bilateral knee x-rays to evaluate for bony abnormality. Patient verbalized understanding.  Bilateral lower extremity edema: -Significant pitting edema on exam, recommend to take water pill (diuretic) as needed for edema. Patient denies dyspnea, chest pain, or orthopnea so less likely CHF related. If symptoms fail to improve or worsen then recommend further evaluation with labs and/or imaging studies.  Meds ordered this encounter  Medications   furosemide (LASIX) 20 MG tablet    Sig: Take 1 tablet (20 mg total) by mouth daily as needed for edema.    Dispense:  30 tablet    Refill:  1    Order Specific Question:   Supervising Provider    Answer:   Beatrice Lecher D [[id number]]   potassium chloride SA (KLOR-CON) 20 MEQ tablet    Sig: Take 1 tablet (20 mEq total) by mouth daily as needed. Take with water pill- Lasix.    Dispense:  30 tablet    Refill:  1    Order Specific Question:   Supervising Provider    Answer:   Beatrice Lecher D [[id number]]    Follow-up: Return for MCW and FBW in 4-8 weeks .     Lorrene Reid, PA-C

## 2021-08-18 ENCOUNTER — Other Ambulatory Visit: Payer: Self-pay | Admitting: Nurse Practitioner

## 2021-08-18 DIAGNOSIS — Z1231 Encounter for screening mammogram for malignant neoplasm of breast: Secondary | ICD-10-CM

## 2021-08-22 ENCOUNTER — Ambulatory Visit
Admission: RE | Admit: 2021-08-22 | Discharge: 2021-08-22 | Disposition: A | Discharge status: Home / Self Care | Payer: HMO | Source: Ambulatory Visit | Attending: Endocrinology | Admitting: Endocrinology

## 2021-08-22 ENCOUNTER — Other Ambulatory Visit: Payer: Self-pay

## 2021-08-22 DIAGNOSIS — M8589 Other specified disorders of bone density and structure, multiple sites: Secondary | ICD-10-CM | POA: Diagnosis not present

## 2021-08-22 DIAGNOSIS — Z780 Asymptomatic menopausal state: Secondary | ICD-10-CM | POA: Diagnosis not present

## 2021-08-22 DIAGNOSIS — M85859 Other specified disorders of bone density and structure, unspecified thigh: Secondary | ICD-10-CM

## 2021-08-25 ENCOUNTER — Other Ambulatory Visit (INDEPENDENT_AMBULATORY_CARE_PROVIDER_SITE_OTHER): Payer: HMO

## 2021-08-25 ENCOUNTER — Other Ambulatory Visit: Payer: Self-pay

## 2021-08-25 DIAGNOSIS — E559 Vitamin D deficiency, unspecified: Secondary | ICD-10-CM

## 2021-08-25 DIAGNOSIS — Z794 Long term (current) use of insulin: Secondary | ICD-10-CM

## 2021-08-25 DIAGNOSIS — E1165 Type 2 diabetes mellitus with hyperglycemia: Secondary | ICD-10-CM | POA: Diagnosis not present

## 2021-08-25 DIAGNOSIS — E7800 Pure hypercholesterolemia, unspecified: Secondary | ICD-10-CM

## 2021-08-25 DIAGNOSIS — I10 Essential (primary) hypertension: Secondary | ICD-10-CM | POA: Diagnosis not present

## 2021-08-25 LAB — COMPREHENSIVE METABOLIC PANEL
ALT: 13 U/L (ref 0–35)
AST: 20 U/L (ref 0–37)
Albumin: 4.1 g/dL (ref 3.5–5.2)
Alkaline Phosphatase: 78 U/L (ref 39–117)
BUN: 24 mg/dL — ABNORMAL HIGH (ref 6–23)
CO2: 27 mEq/L (ref 19–32)
Calcium: 9.4 mg/dL (ref 8.4–10.5)
Chloride: 102 mEq/L (ref 96–112)
Creatinine, Ser: 0.82 mg/dL (ref 0.40–1.20)
GFR: 65.79 mL/min (ref >60.00)
Glucose, Bld: 204 mg/dL — ABNORMAL HIGH (ref 70–99)
Potassium: 4.7 mEq/L (ref 3.5–5.1)
Sodium: 136 mEq/L (ref 135–145)
Total Bilirubin: 0.5 mg/dL (ref 0.2–1.2)
Total Protein: 6.4 g/dL (ref 6.0–8.3)

## 2021-08-25 LAB — HEMOGLOBIN A1C: Hgb A1c MFr Bld: 7.6 % — ABNORMAL HIGH (ref 4.6–6.5)

## 2021-08-25 LAB — LIPID PANEL
Cholesterol: 133 mg/dL (ref 0–200)
HDL: 50.6 mg/dL (ref >39.00)
LDL Cholesterol: 57 mg/dL (ref 0–99)
NonHDL: 82.19
Total CHOL/HDL Ratio: 3
Triglycerides: 125 mg/dL (ref 0.0–149.0)
VLDL: 25 mg/dL (ref 0.0–40.0)

## 2021-08-25 LAB — MICROALBUMIN / CREATININE URINE RATIO
Creatinine,U: 61 mg/dL
Microalb Creat Ratio: 2.2 mg/g (ref 0.0–30.0)
Microalb, Ur: 1.3 mg/dL (ref 0.0–1.9)

## 2021-08-25 LAB — VITAMIN D 25 HYDROXY (VIT D DEFICIENCY, FRACTURES): VITD: 53.02 ng/mL (ref 30.00–100.00)

## 2021-08-25 LAB — TSH: TSH: 3.79 u[IU]/mL (ref 0.35–5.50)

## 2021-08-25 LAB — T4, FREE: Free T4: 1.01 ng/dL (ref 0.60–1.60)

## 2021-09-01 ENCOUNTER — Encounter: Payer: Self-pay | Admitting: Endocrinology

## 2021-09-01 ENCOUNTER — Other Ambulatory Visit: Payer: Self-pay

## 2021-09-01 ENCOUNTER — Ambulatory Visit (INDEPENDENT_AMBULATORY_CARE_PROVIDER_SITE_OTHER): Payer: HMO | Admitting: Endocrinology

## 2021-09-01 VITALS — BP 124/54 | HR 101 | Ht <= 58 in | Wt 128.0 lb

## 2021-09-01 DIAGNOSIS — E1165 Type 2 diabetes mellitus with hyperglycemia: Secondary | ICD-10-CM

## 2021-09-01 DIAGNOSIS — E7800 Pure hypercholesterolemia, unspecified: Secondary | ICD-10-CM | POA: Diagnosis not present

## 2021-09-01 DIAGNOSIS — I10 Essential (primary) hypertension: Secondary | ICD-10-CM | POA: Diagnosis not present

## 2021-09-01 DIAGNOSIS — E063 Autoimmune thyroiditis: Secondary | ICD-10-CM

## 2021-09-01 DIAGNOSIS — M85859 Other specified disorders of bone density and structure, unspecified thigh: Secondary | ICD-10-CM

## 2021-09-01 DIAGNOSIS — Z794 Long term (current) use of insulin: Secondary | ICD-10-CM

## 2021-09-01 LAB — POCT GLUCOSE (DEVICE FOR HOME USE): POC Glucose: 260 mg/dl — AB (ref 70–99)

## 2021-09-01 MED ORDER — ALENDRONATE SODIUM 70 MG PO TABS: 70.0000 mg | ORAL_TABLET | ORAL | 11 refills | Status: DC

## 2021-09-01 MED ORDER — FREESTYLE LIBRE 2 READER DEVI: 1.0000 | Freq: Once | 0 refills | Status: AC

## 2021-09-01 NOTE — Progress Notes (Signed)
Patient ID: Jennifer Bailey, female   DOB: [birth year], 86 y.o.   MRN: [id number]    Chief complaint: follow up    History of Present Illness     Type 2 DIABETES MELITUS, date of diagnosis: 2003  She has been on insulin for several years but continues to have labile blood sugars despite using basal bolus regimen  She is very compliant with her insulin at mealtimes  Is taking small amounts of insulin and is not on any oral hypoglycemics She was changed from Levemir to [place] insulin in 1/17 because of relatively inconsistent control   RECENT history:   Insulin regimen: Tresiba 10 units in a.m.;   mealtime NovoLog coverage: At breakfast: 5 units pc, 3-5  lunch and 3-5 at supper                                  Current blood sugar patterns from freestyle libre, diabetes management and problems identified:  Daily management, problems identified and abnormal patterns are discussed in detail in the CGM analysis   Her [name] is raised s at 7.6 and  Blood sugars are much more erratic and higher than before Her history was somewhat difficult to obtain and she does think she is taking her insulin for meals before eating instead of after but may not do this consistently  Today because her sugar was closer to normal at lunchtime she did not take any mealtime coverage and her blood sugar was almost 300 postprandially  She may not be always taking her insulin at mealtimes and blood sugars are near normal  Even though she is taking more insulin at breakfast at times and eating a balanced meals he seems to have the highest readings after breakfast with some variability  She may be also taking some extra insulin at bedtime which will cause lower readings early part of the night but once had hypoglycemia also  Appears to be getting more significant dawn phenomenon at times and fasting readings are mostly higher   Interpretation of her CGM from the freestyle libre 2 for the last 2  weeks  Her meter is programmed to the wrong date and reading September 05, 2026  Blood sugars are generally fluctuating much more than before and has tendency to rising blood sugars in the early mornings and also periodically after lunch and supper   HIGHEST blood sugars after breakfast as before Lowest blood sugars on an average are between 10 PM-midnight OVERNIGHT blood sugars are mildly increased in the early part of the night but generally tend to be rising progressively until after breakfast fairly well controlled with some variability; has had low sugars between 11 PM-3 AM occasionally  HYPOGLYCEMIA has occurred occasionally overnight can also after her evening meal and bedtime at times  Hypoglycemia is periodically preceded by high readings especially after dinner POSTPRANDIAL readings are generally rising significantly above the preprandial readings and on some days much more after breakfast  After lunch blood sugars are highly variable and occasionally may rise excessively also including today  Blood sugars after dinner Usually fairly level are lower compared to preprandial readings  Hypoglycemia has been rare but Verio occasionally occur midday or after about 10 PM  Glucose after lunch today in the office was 260 with a fingerstick and 292 with her meter   CGM use % of time 98  2-week average/GV 180/26  Time in range  52     %  % Time Above 180 31+16  % Time above 250   % Time Below 70 1     PRE-MEAL Fasting Lunch Dinner Bedtime Overall  Glucose range:       Averages: 174   142 184   POST-MEAL PC Breakfast PC Lunch PC Dinner  Glucose range:     Averages: 242 225 188   Previously:   CGM use % of time 97  2-week average/GV 137  Time in range       77 %  % Time Above 180 18+1  % Time above 250   % Time Below 70 4     PRE-MEAL Fasting Lunch Dinner Bedtime Overall  Glucose range:       Averages: 135 149 151 127 137   POST-MEAL PC Breakfast PC Lunch PC Dinner   Glucose range:     Averages: 161 145 141   Symptoms with hypoglycemia: feels weak and shaky Low sugars are treated with Soft drink or glucose tablets      Meals: 3 meals per day.  Usually has small portions. Eggs/bread or one pancake with protein at 6:30 AM-7 am; has mid morning snack.  Has hs snack if blood sugar not high Evening meal usually balanced at 5 pm with 1-2 carbohydrates  Physical activity: exercise: Some walking and yardwork         Dietician visit: Most recent: 2004            Wt Readings from Last 3 Encounters:  09/01/21 128 lb (58.1 kg)  08/07/21 128 lb 6.4 oz (58.2 kg)  05/12/21 129 lb (67.8 kg)   Complications: are: Mild neuropathy controlled symptomatically  Lab Results  Component Value Date   HGBA1C 7.6 (H) 08/25/2021   HGBA1C 7.1 (H) 05/19/2021   HGBA1C 7.9 (H) 02/10/2021   Lab Results  Component Value Date   MICROALBUR 1.3 08/25/2021   LDLCALC 57 08/25/2021   CREATININE 0.82 08/25/2021    HYPERTENSION:  this has been mild and treated with 50 mg losartan.  She is NOT checking her blood pressure at home     BP Readings from Last 3 Encounters:  09/01/21 (!) 124/54  08/07/21 124/67  02/17/21 134/68    OSTEOPENIA:  She has had osteopenia with T score -2.3 at the hip in 3/09 and was started on Fosamax at that time. This was stopped in 02/2014.   Last bone density in 11/22 compared to 2016: DualFemur Neck Right 10/03/2015    T score -2.1 compared to   -1.9 T score is -2.4 at the radius  She had a traumatic fracture of her tibial plateau on 12/02/2020  History of vitamin D Deficiency, she is taking 50,000 units monthly with adequate levels  Lab Results  Component Value Date   VD25OH 53.02 08/25/2021   VD25OH 50.06 02/10/2021    OTHER active problems: See review of systems   LABS:    No visits with results within 1 Week(s) from this visit.  Latest known visit with results is:  Lab on 08/25/2021  Component Date Value Ref Range Status    Free T4 08/25/2021 1.01  0.60 - 1.60 ng/dL Final   Comment: Specimens from patients who are undergoing biotin therapy and /or ingesting biotin supplements may contain high levels of biotin.  The higher biotin concentration in these specimens interferes with this Free T4 assay.  Specimens that contain high levels  of biotin may cause false high results  for this Free T4 assay.  Please interpret results in light of the total clinical presentation of the patient.     TSH 08/25/2021 3.79  0.35 - 5.50 uIU/mL Final   Cholesterol 08/25/2021 133  0 - 200 mg/dL Final   ATP III Classification       Desirable:  < 200 mg/dL               Borderline High:  200 - 239 mg/dL          High:  > = 240 mg/dL   Triglycerides 08/25/2021 125.0  0.0 - 149.0 mg/dL Final   Normal:  <150 mg/dLBorderline High:  150 - 199 mg/dL   HDL 08/25/2021 50.60  >39.00 mg/dL Final   VLDL 08/25/2021 25.0  0.0 - 40.0 mg/dL Final   LDL Cholesterol 08/25/2021 57  0 - 99 mg/dL Final   Total CHOL/HDL Ratio 08/25/2021 3   Final                  Men          Women1/2 Average Risk     3.4          3.3Average Risk          5.0          4.42X Average Risk          9.6          7.13X Average Risk          15.0          11.0                       NonHDL 08/25/2021 82.19   Final   NOTE:  Non-HDL goal should be 30 mg/dL higher than patient's LDL goal (i.e. LDL goal of < 70 mg/dL, would have non-HDL goal of < 100 mg/dL)   Sodium 08/25/2021 136  135 - 145 mEq/L Final   Potassium 08/25/2021 4.7  3.5 - 5.1 mEq/L Final   Chloride 08/25/2021 102  96 - 112 mEq/L Final   CO2 08/25/2021 27  19 - 32 mEq/L Final   Glucose, Bld 08/25/2021 204 (H)  70 - 99 mg/dL Final   BUN 08/25/2021 24 (H)  6 - 23 mg/dL Final   Creatinine, Ser 08/25/2021 0.82  0.40 - 1.20 mg/dL Final   Total Bilirubin 08/25/2021 0.5  0.2 - 1.2 mg/dL Final   Alkaline Phosphatase 08/25/2021 78  39 - 117 U/L Final   AST 08/25/2021 20  0 - 37 U/L Final   ALT 08/25/2021 13  0 - 35 U/L Final    Total Protein 08/25/2021 6.4  6.0 - 8.3 g/dL Final   Albumin 08/25/2021 4.1  3.5 - 5.2 g/dL Final   GFR 08/25/2021 65.79  >60.00 mL/min Final   Calculated using the CKD-EPI Creatinine Equation (2021)   Calcium 08/25/2021 9.4  8.4 - 10.5 mg/dL Final   Hgb [name] MFr Bld 08/25/2021 7.6 (H)  4.6 - 6.5 % Final   Glycemic Control Guidelines for People with Diabetes:Non Diabetic:  <6%Goal of Therapy: <7%Additional Action Suggested:  >8%    VITD 08/25/2021 53.02  30.00 - 100.00 ng/mL Final   Microalb, Ur 08/25/2021 1.3  0.0 - 1.9 mg/dL Final   Creatinine,U 08/25/2021 61.0  mg/dL Final   Microalb Creat Ratio 08/25/2021 2.2  0.0 - 30.0 mg/g Final    Allergies as of 09/01/2021  Reactions   Macrodantin [nitrofurantoin Macrocrystal] Swelling   Prednisone    Make real "shaky-like, nervious"   Penicillins Rash   Has patient had a PCN reaction causing immediate rash, facial/tongue/throat swelling, SOB or lightheadedness with hypotension:Yes Has patient had a PCN reaction causing severe rash involving mucus membranes or skin necrosis: No Has patient had a PCN reaction that required hospitalization: No Has patient had a PCN reaction occurring within the last 10 years:No If all of the above answers are "NO", then may proceed with Cephalosporin use.        Medication List        Accurate as of September 01, 2021  1:12 PM. If you have any questions, ask your nurse or doctor.          Alcohol Pads 70 % Pads USE 4 PER DAY   Alphagan P 0.1 % Soln Generic drug: brimonidine Place 1 drop into the left eye 2 (two) times daily.   aspirin 81 MG tablet Take 1 tablet (81 mg total) by mouth daily.   BD Pen Needle Nano U/F 32G X 4 MM Misc Generic drug: Insulin Pen Needle USE AS DIRECTED FIVE TIMES A DAY.   cycloSPORINE 0.05 % ophthalmic emulsion Commonly known as: RESTASIS 1 drop 2 (two) times daily.   dorzolamidel-timolol 22.3-6.8 MG/ML Soln ophthalmic solution Commonly known as:  COSOPT Place 1 drop into the left eye 2 (two) times daily.   enoxaparin 40 MG/0.4ML injection Commonly known as: LOVENOX Inject 0.4 mLs (40 mg total) into the skin daily for 28 days.   FreeStyle Libre 14 Day Reader Kerrin Mo USE DAILY TO CHECK BLOOD SUGAR   FreeStyle Libre 2 Sensor Misc APPLY TO ARM EVERY 14 DAYS TO MONITOR BLOOD SUGAR.   furosemide 20 MG tablet Commonly known as: LASIX Take 1 tablet (20 mg total) by mouth daily as needed for edema.   gabapentin 100 MG capsule Commonly known as: NEURONTIN TAKE 3 TO 4 CAPSULES BY MOUTH AT BEDTIME.   HYDROcodone-acetaminophen 7.5-325 MG tablet Commonly known as: NORCO Take 1 tablet by mouth every 6 (six) hours as needed for moderate pain or severe pain.   levothyroxine 88 MCG tablet Commonly known as: SYNTHROID Take 1 tablet (88 mcg total) by mouth daily.   losartan 50 MG tablet Commonly known as: COZAAR TAKE 1 TABLET BY MOUTH DAILY.   lovastatin 40 MG tablet Commonly known as: MEVACOR TAKE 1 TABLET BY MOUTH ONCE DAILY WITH A MEAL.   NovoLOG FlexPen 100 UNIT/ML FlexPen Generic drug: insulin aspart INJECT 3 TO 5 UNITS SUBCUTANEOUSLY BEFORE MEALS AS DIRECTED.   Omega-3 1000 MG Caps Take 1 capsule by mouth 4 (four) times daily.   OneTouch Verio test strip Generic drug: glucose blood USE TO TEST BLOOD SUGAR 4 TIMES DAILY   pantoprazole 40 MG tablet Commonly known as: PROTONIX Take 40 mg by mouth 2 (two) times daily as needed (indigestion).   polyethylene glycol 17 g packet Commonly known as: MIRALAX / GLYCOLAX Take 17 g by mouth daily as needed for mild constipation. If not relieved with senokot   potassium chloride SA 20 MEQ tablet Commonly known as: KLOR-CON Take 1 tablet (20 mEq total) by mouth daily as needed. Take with water pill- Lasix.   PreserVision AREDS 2 Caps Take 1 capsule by mouth 2 (two) times daily.   senna-docusate 8.6-50 MG tablet Commonly known as: Senokot-S Take 1 tablet by mouth 2 (two) times  daily. While on narcotics   Tresiba FlexTouch 100 UNIT/ML  FlexTouch Pen Generic drug: insulin degludec INJECT 0.11 MLS (11 UNITS TOTAL) INTO THE SKIN DAILY. What changed: See the new instructions.   vitamin C 100 MG tablet Take 500 mg by mouth daily.   Vitamin D (Ergocalciferol) 1.25 MG (50000 UNIT) Caps capsule Commonly known as: DRISDOL TAKE 1 CAPSULE BY MOUTH ONCE A MONTH. What changed: See the new instructions.        Allergies:  Allergies  Allergen Reactions   Macrodantin [Nitrofurantoin Macrocrystal] Swelling   Prednisone     Make real "shaky-like, nervious"   Penicillins Rash    Has patient had a PCN reaction causing immediate rash, facial/tongue/throat swelling, SOB or lightheadedness with hypotension:Yes Has patient had a PCN reaction causing severe rash involving mucus membranes or skin necrosis: No Has patient had a PCN reaction that required hospitalization: No Has patient had a PCN reaction occurring within the last 10 years:No If all of the above answers are "NO", then may proceed with Cephalosporin use.     Past Medical History:  Diagnosis Date   Diabetes mellitus without complication ([place])    Thyroid disease     Past Surgical History:  Procedure Laterality Date   ABDOMINAL HYSTERECTOMY     CHOLECYSTECTOMY     COLON SURGERY     ORIF TIBIA PLATEAU Right 12/04/2020   Procedure: OPEN REDUCTION INTERNAL FIXATION (ORIF) TIBIAL PLATEAU;  Surgeon: Shona Needles, MD;  Location: [facility];  Service: Orthopedics;  Laterality: Right;    Family History  Problem Relation Age of Onset   Cancer Mother    Diabetes Brother     Social History:  reports that she has never smoked. She has never used smokeless tobacco. She reports that she does not drink alcohol and does not use drugs.   Review of Systems   Peripheral NEUROPATHY with sensory symptoms: Treated with gabapentin taking at bedtime with adequate relief  HYPOTHYROIDISM: She has had primary hypothyroidism,  long-standing . She feels fairly good. Her TSH is consistently normal, now taking 88 micrograms levothyroxine daily  Lab Results  Component Value Date   TSH 3.79 08/25/2021   TSH 2.84 05/19/2021   TSH 7.83 (H) 02/10/2021   FREET4 1.01 08/25/2021   FREET4 1.14 05/19/2021   FREET4 1.05 02/10/2021       HYPERLIPIDEMIA: The lipid abnormality consists of elevated LDL,  taking lovastatin with consistently good control, last level below 70.  Lab Results  Component Value Date   CHOL 133 08/25/2021   HDL 50.60 08/25/2021   LDLCALC 57 08/25/2021   TRIG 125.0 08/25/2021   CHOLHDL 3 08/25/2021     She has had her Covid booster and flu shots   Examination:   BP (!) 124/54   Pulse (!) 101   Ht 4\' 10"  (1.473 m)   Wt 128 lb (58.1 kg)   SpO2 93%   BMI 26.75 kg/m   Body mass index is 26.75 kg/m.    She has 2+ edema on the right lower leg   ASSESSMENT/ PLAN:    Diabetes type 2 on insulin   See history of present illness for detailed discussion of current management, blood sugar patterns and problems identified  Her [name] is higher at 7.6  She is on basal bolus insulin with relatively low insulin doses  Her blood sugars are averaging 184 at home and only 52% within target Having difficulty with postprandial high readings at breakfast and lunch Again difficult to get her proper download initially because of wrong date  programmed  She recently has significantly evident dawn phenomenon She may not be always taking her mealtime insulin before eating such as today when blood sugars are near normal causing high readings Also appears to be needing more insulin for covering breakfast even with a small balanced meal She thinks blood sugars may be higher from stress  Recommendations:  Increase Tresiba to 11 units She may need to increase it further if fasting readings continue to be high She will avoid taking any correction doses of NovoLog to supper since this may tend to cause  hypoglycemia She will take at least 1 more unit to cover breakfast even when blood sugars are normal She will make sure she takes her NovoLog before starting to eat regardless of blood sugar level and wait 15 minutes her blood sugars are higher to start with Avoid taking insulin more than 2 hours after the meal for corrections Call if not consistently controlled  Hypertension:  Her blood pressure is controlled She will continue 50 mg of losartan  Discussed that she can take Lasix as needed for her leg edema as prescribed by PCP earlier this month  Hypothyroidism :her TSH is still normal and she can continue 88 mcg daily   Hip osteopenia: This is getting worse and bone density at the radius is -2.4 She will restart the Fosamax which had helped previously and likely take it for another 3 years  LIPIDS: Well-controlled on lovastatin  Follow-up in 3 months      There are no Patient Instructions on file for this visit.  She can have labs on the same day as her office visit  Elayne Snare 09/01/2021, 1:12 PM

## 2021-09-01 NOTE — Patient Instructions (Signed)
Am Novolog 5-7 units BEFORE THE MEAL TResiba 11 units  No extra Novolog after supper

## 2021-09-02 ENCOUNTER — Telehealth: Payer: Self-pay

## 2021-09-02 ENCOUNTER — Other Ambulatory Visit: Payer: Self-pay | Admitting: Endocrinology

## 2021-09-02 NOTE — Telephone Encounter (Signed)
Jennifer Bailey has Rx Lovenox listed on her AVS from 09/01/2021. Patient wanted to know if she should be on it. Call back phone [phone].  Thank you

## 2021-09-02 NOTE — Telephone Encounter (Signed)
Called patient informed her that was a medication given by another provider back in March but she no longer takes it. Patient understood

## 2021-09-08 NOTE — Telephone Encounter (Signed)
done

## 2021-10-01 ENCOUNTER — Encounter: Payer: Self-pay | Admitting: Physician Assistant

## 2021-10-01 ENCOUNTER — Other Ambulatory Visit: Payer: Self-pay

## 2021-10-01 ENCOUNTER — Ambulatory Visit (INDEPENDENT_AMBULATORY_CARE_PROVIDER_SITE_OTHER): Payer: HMO | Admitting: Physician Assistant

## 2021-10-01 VITALS — BP 138/70 | HR 72 | Temp 98.2°F | Ht <= 58 in | Wt 126.0 lb

## 2021-10-01 DIAGNOSIS — Z0000 Encounter for general adult medical examination without abnormal findings: Secondary | ICD-10-CM

## 2021-10-01 NOTE — Patient Instructions (Signed)
Preventive Care 21 Years and Older, Female Preventive care refers to lifestyle choices and visits with your health care provider that can promote health and wellness. Preventive care visits are also called wellness exams. What can I expect for my preventive care visit? Counseling Your health care provider may ask you questions about your: Medical history, including: Past medical problems. Family medical history. Pregnancy and menstrual history. History of falls. Current health, including: Memory and ability to understand (cognition). Emotional well-being. Home life and relationship well-being. Sexual activity and sexual health. Lifestyle, including: Alcohol, nicotine or tobacco, and drug use. Access to firearms. Diet, exercise, and sleep habits. Work and work Statistician. Sunscreen use. Safety issues such as seatbelt and bike helmet use. Physical exam Your health care provider will check your: Height and weight. These may be used to calculate your BMI (body mass index). BMI is a measurement that tells if you are at a healthy weight. Waist circumference. This measures the distance around your waistline. This measurement also tells if you are at a healthy weight and may help predict your risk of certain diseases, such as type 2 diabetes and high blood pressure. Heart rate and blood pressure. Body temperature. Skin for abnormal spots. What immunizations do I need? Vaccines are usually given at various ages, according to a schedule. Your health care provider will recommend vaccines for you based on your age, medical history, and lifestyle or other factors, such as travel or where you work. What tests do I need? Screening Your health care provider may recommend screening tests for certain conditions. This may include: Lipid and cholesterol levels. Hepatitis C test. Hepatitis B test. HIV (human immunodeficiency virus) test. STI (sexually transmitted infection) testing, if you are at  risk. Lung cancer screening. Colorectal cancer screening. Diabetes screening. This is done by checking your blood sugar (glucose) after you have not eaten for a while (fasting). Mammogram. Talk with your health care provider about how often you should have regular mammograms. BRCA-related cancer screening. This may be done if you have a family history of breast, ovarian, tubal, or peritoneal cancers. Bone density scan. This is done to screen for osteoporosis. Talk with your health care provider about your test results, treatment options, and if necessary, the need for more tests. Follow these instructions at home: Eating and drinking  Eat a diet that includes fresh fruits and vegetables, whole grains, lean protein, and low-fat dairy products. Limit your intake of foods with high amounts of sugar, saturated fats, and salt. Take vitamin and mineral supplements as recommended by your health care provider. Do not drink alcohol if your health care provider tells you not to drink. If you drink alcohol: Limit how much you have to 0-1 drink a day. Know how much alcohol is in your drink. In the [place] one drink equals one 12 oz bottle of beer (355 mL), one 5 oz glass of wine (148 mL), or one 1 oz glass of hard liquor (44 mL). Lifestyle Brush your teeth every morning and night with fluoride toothpaste. Floss one time each day. Exercise for at least 30 minutes 5 or more days each week. Do not use any products that contain nicotine or tobacco. These products include cigarettes, chewing tobacco, and vaping devices, such as e-cigarettes. If you need help quitting, ask your health care provider. Do not use drugs. If you are sexually active, practice safe sex. Use a condom or other form of protection in order to prevent STIs. Take aspirin only as told by your  health care provider. Make sure that you understand how much to take and what form to take. Work with your health care provider to find out whether it  is safe and beneficial for you to take aspirin daily. Ask your health care provider if you need to take a cholesterol-lowering medicine (statin). Find healthy ways to manage stress, such as: Meditation, yoga, or listening to music. Journaling. Talking to a trusted person. Spending time with friends and family. Minimize exposure to UV radiation to reduce your risk of skin cancer. Safety Always wear your seat belt while driving or riding in a vehicle. Do not drive: If you have been drinking alcohol. Do not ride with someone who has been drinking. When you are tired or distracted. While texting. If you have been using any mind-altering substances or drugs. Wear a helmet and other protective equipment during sports activities. If you have firearms in your house, make sure you follow all gun safety procedures. What's next? Visit your health care provider once a year for an annual wellness visit. Ask your health care provider how often you should have your eyes and teeth checked. Stay up to date on all vaccines. This information is not intended to replace advice given to you by your health care provider. Make sure you discuss any questions you have with your health care provider. Document Revised: 03/19/2021 Document Reviewed: 03/19/2021 Elsevier Patient Education  [place].

## 2021-10-01 NOTE — Progress Notes (Signed)
Subjective:   Jennifer Bailey is a 86 y.o. female who presents for Medicare Annual (Subsequent) preventive examination.  Review of Systems    General:   No F/C, wt loss Pulm:   No DIB, SOB, pleuritic chest pain Card:  No CP, palpitations Abd:  No n/v/d or pain Ext:  No inc edema from baseline    Objective:    Today's Vitals   10/01/21 1409  BP: 138/70  Pulse: 72  Temp: 98.2 F (36.8 C)  SpO2: 100%  Weight: 126 lb (57.2 kg)  Height: 4\' 10"  (1.473 m)   Body mass index is 26.33 kg/m.  Advanced Directives 12/02/2020 12/02/2020 07/09/2020 10/30/2019 07/06/2019 01/27/2019 11/24/2018  Does Patient Have a Medical Advance Directive? No No Yes Yes Yes Yes Yes  Type of Advance Directive - Public librarian;Living will [place];Living will - [place]  Does patient want to make changes to medical advance directive? - - No - Patient declined No - Patient declined - No - Patient declined -  Copy of [place] in Chart? - - - No - copy requested - - -  Would patient like information on creating a medical advance directive? No - Patient declined - - - - - -    Current Medications (verified) Outpatient Encounter Medications as of 10/01/2021  Medication Sig   Alcohol Swabs (ALCOHOL PADS) 70 % PADS USE 4 PER DAY   alendronate (FOSAMAX) 70 MG tablet Take 1 tablet (70 mg total) by mouth every 7 (seven) days. Take with a full glass of water on an empty stomach.   Ascorbic Acid (VITAMIN C) 100 MG tablet Take 500 mg by mouth daily.    aspirin 81 MG tablet Take 1 tablet (81 mg total) by mouth daily.   BD PEN NEEDLE NANO U/F 32G X 4 MM MISC USE AS DIRECTED FIVE TIMES A DAY.   brimonidine (ALPHAGAN P) 0.1 % SOLN Place 1 drop into the left eye 2 (two) times daily.   Continuous Blood Gluc Receiver (FREESTYLE LIBRE 14 DAY READER) DEVI USE DAILY TO CHECK BLOOD SUGAR   Continuous Blood Gluc Sensor  (FREESTYLE LIBRE 2 SENSOR) MISC APPLY TO ARM EVERY 14 DAYS TO MONITOR BLOOD SUGAR.   cycloSPORINE (RESTASIS) 0.05 % ophthalmic emulsion 1 drop 2 (two) times daily.   dorzolamidel-timolol (COSOPT) 22.3-6.8 MG/ML SOLN ophthalmic solution Place 1 drop into the left eye 2 (two) times daily.   gabapentin (NEURONTIN) 100 MG capsule TAKE 3 TO 4 CAPSULES BY MOUTH AT BEDTIME.   HYDROcodone-acetaminophen (NORCO) 7.5-325 MG tablet Take 1 tablet by mouth every 6 (six) hours as needed for moderate pain or severe pain.   levothyroxine (SYNTHROID) 88 MCG tablet Take 1 tablet (88 mcg total) by mouth daily.   losartan (COZAAR) 50 MG tablet TAKE 1 TABLET BY MOUTH DAILY. (Patient taking differently: Take 50 mg by mouth daily.)   lovastatin (MEVACOR) 40 MG tablet TAKE 1 TABLET BY MOUTH ONCE DAILY WITH A MEAL.   Multiple Vitamins-Minerals (PRESERVISION AREDS 2) CAPS Take 1 capsule by mouth 2 (two) times daily.   NOVOLOG FLEXPEN 100 UNIT/ML FlexPen INJECT 3 TO 5 UNITS SUBCUTANEOUSLY BEFORE MEALS AS DIRECTED.   Omega-3 1000 MG CAPS Take 1 capsule by mouth 4 (four) times daily.   ONETOUCH VERIO test strip USE TO TEST BLOOD SUGAR 4 TIMES DAILY   pantoprazole (PROTONIX) 40 MG tablet Take 40 mg by mouth 2 (  two) times daily as needed (indigestion).   polyethylene glycol (MIRALAX / GLYCOLAX) 17 g packet Take 17 g by mouth daily as needed for mild constipation. If not relieved with senokot   potassium chloride SA (KLOR-CON) 20 MEQ tablet Take 1 tablet (20 mEq total) by mouth daily as needed. Take with water pill- Lasix.   senna-docusate (SENOKOT-S) 8.6-50 MG tablet Take 1 tablet by mouth 2 (two) times daily. While on narcotics   TRESIBA FLEXTOUCH 100 UNIT/ML FlexTouch Pen INJECT 0.11 MLS (11 UNITS TOTAL) INTO THE SKIN DAILY. (Patient taking differently: Inject 11 Units into the skin daily.)   Vitamin D, Ergocalciferol, (DRISDOL) 1.25 MG (50000 UNIT) CAPS capsule TAKE 1 CAPSULE BY MOUTH ONCE A MONTH. (Patient taking differently:  Take 50,000 Units by mouth every 7 (seven) days.)   enoxaparin (LOVENOX) 40 MG/0.4ML injection Inject 0.4 mLs (40 mg total) into the skin daily for 28 days.   No facility-administered encounter medications on file as of 10/01/2021.    Allergies (verified) Macrodantin [nitrofurantoin macrocrystal], Prednisone, and Penicillins   History: Past Medical History:  Diagnosis Date   Diabetes mellitus without complication ([place])    Thyroid disease    Past Surgical History:  Procedure Laterality Date   ABDOMINAL HYSTERECTOMY     CHOLECYSTECTOMY     COLON SURGERY     ORIF TIBIA PLATEAU Right 12/04/2020   Procedure: OPEN REDUCTION INTERNAL FIXATION (ORIF) TIBIAL PLATEAU;  Surgeon: Shona Needles, MD;  Location: [facility];  Service: Orthopedics;  Laterality: Right;   Family History  Problem Relation Age of Onset   Cancer Mother    Diabetes Brother    Social History   Socioeconomic History   Marital status: Married    Spouse name: Not on file   Number of children: Not on file   Years of education: Not on file   Highest education level: Not on file  Occupational History   Not on file  Tobacco Use   Smoking status: Never   Smokeless tobacco: Never  Vaping Use   Vaping Use: Never used  Substance and Sexual Activity   Alcohol use: Never   Drug use: Never   Sexual activity: Not on file  Other Topics Concern   Not on file  Social History Narrative   Not on file   Social Determinants of Health   Financial Resource Strain: Low Risk    Difficulty of Paying Living Expenses: Not hard at all  Food Insecurity: No Food Insecurity   Worried About Charity fundraiser in the Last Year: Never true   [place] in the Last Year: Never true  Transportation Needs: No Transportation Needs   Lack of Transportation (Medical): No   Lack of Transportation (Non-Medical): No  Physical Activity: Not on file  Stress: Stress Concern Present   Feeling of Stress : To some extent  Social Connections:  Not on file    Tobacco Counseling Counseling given: Not Answered     Diabetic? yes   Activities of Daily Living In your present state of health, do you have any difficulty performing the following activities: 10/01/2021 05/12/2021  Hearing? Tempie Donning  Vision? N N  Difficulty concentrating or making decisions? N N  Walking or climbing stairs? Y Y  Comment - -  Dressing or bathing? N N  Doing errands, shopping? Y N  Some recent data might be hidden    Patient Care Team: Ronnell Freshwater, NP as PCP - General (Family Medicine) Dwyane Dee,  Vicenta Aly, MD as Consulting Physician (Endocrinology)  Indicate any recent Medical Services you may have received from other than [facility] providers in the past year (date may be approximate).     Assessment:   This is a routine wellness examination for [place].  Hearing/Vision screen No results found.  Dietary issues and exercise activities discussed: -Patient endorses good appetite, discussed heart healthy diet low in fat and carbohydrates. No regular exercise regimen, limited by mobility/gait.   Goals Addressed   None   Depression Screen PHQ 2/9 Scores 10/01/2021 05/12/2021 04/22/2020 12/13/2019 10/30/2019 01/27/2019 07/18/2018  PHQ - 2 Score 0 3 3 0 0 0 0  PHQ- 9 Score 2 5 5 1  - - 0    Fall Risk Fall Risk  10/01/2021 05/12/2021 12/13/2019 07/26/2017 02/11/2017  Falls in the past year? 1 1 0 No No  Number falls in past yr: 0 0 0 - -  Injury with Fall? 1 0 0 - -  Risk for fall due to : History of fall(s);Impaired balance/gait;Impaired mobility - - - -  Follow up Falls evaluation completed Falls evaluation completed Falls evaluation completed - -    FALL RISK PREVENTION PERTAINING TO THE HOME:  Any stairs in or around the home? Yes  If so, are there any without handrails? No  Home free of loose throw rugs in walkways, pet beds, electrical cords, etc? Yes  Adequate lighting in your home to reduce risk of falls? Yes   ASSISTIVE DEVICES UTILIZED TO  PREVENT FALLS:  Life alert? No  Use of a cane, walker or w/c? No  Grab bars in the bathroom? Yes  Shower chair or bench in shower? Yes  Elevated toilet seat or a handicapped toilet? Yes   TIMED UP AND GO:  Was the test performed? Yes .  Length of time to ambulate 10 feet: 30 sec.   Gait slow and steady without use of assistive device  Cognitive Function: stable   6CIT Screen 10/01/2021  What Year? 0 points  What month? 0 points  What time? 0 points  Count back from 20 0 points  Months in reverse 4 points  Repeat phrase 2 points  Total Score 6    Immunizations Immunization History  Administered Date(s) Administered   Fluad Quad(high Dose 65+) 07/06/2019   Influenza, High Dose Seasonal PF 06/29/2016, 07/26/2017, 07/18/2018   Influenza,inj,Quad PF,6+ Mos 07/11/2015   Influenza-Unspecified 06/19/2013, 07/13/2014, 07/25/2020   PFIZER(Purple Top)SARS-COV-2 Vaccination 11/17/2019, 12/10/2019, 07/25/2020   Pneumococcal Conjugate-13 02/14/2014   Pneumococcal Polysaccharide-23 09/08/2004, 10/25/2017   Zoster Recombinat (Shingrix) 04/22/2020, 07/08/2020    TDAP status: Due, Education has been provided regarding the importance of this vaccine. Advised may receive this vaccine at local pharmacy or Health Dept. Aware to provide a copy of the vaccination record if obtained from local pharmacy or Health Dept. Verbalized acceptance and understanding.  Flu Vaccine status: Up to date  Pneumococcal vaccine status: Up to date  Covid-19 vaccine status: Completed vaccines  Qualifies for Shingles Vaccine? Yes   Zostavax completed No   Shingrix Completed?: Yes  Screening Tests Health Maintenance  Topic Date Due   TETANUS/TDAP  Never done   COVID-19 Vaccine (4 - Booster for Pfizer series) 09/19/2020   INFLUENZA VACCINE  05/05/2021   HEMOGLOBIN [name]  02/22/2022   OPHTHALMOLOGY EXAM  06/30/2022   FOOT EXAM  10/01/2022   Pneumonia Vaccine 58+ Years old  Completed   DEXA SCAN   Completed   Zoster Vaccines- Shingrix  Completed  HPV VACCINES  Aged Out    Health Maintenance  Health Maintenance Due  Topic Date Due   TETANUS/TDAP  Never done   COVID-19 Vaccine (4 - Booster for Pfizer series) 09/19/2020   INFLUENZA VACCINE  05/05/2021    Colorectal cancer screening: No longer required.   Patient scheduled for mammogram on 10/09/2021.   Bone Density status: Completed 08/22/2021. Results reflect: Bone density results: OSTEOPOROSIS. Repeat every 2 years.  Lung Cancer Screening: (Low Dose CT Chest recommended if Age 36-80 years, 30 pack-year currently smoking OR have quit w/in 15years.) does not qualify.   Lung Cancer Screening Referral: n/a  Additional Screening:  Hepatitis C Screening: does qualify; Patient declined screening.  Vision Screening: Recommended annual ophthalmology exams for early detection of glaucoma and other disorders of the eye. Is the patient up to date with their annual eye exam?  Yes  Who is the provider or what is the name of the office in which the patient attends annual eye exams? Dr. Katy Fitch If pt is not established with a provider, would they like to be referred to a provider to establish care? No .   Dental Screening: Recommended annual dental exams for proper oral hygiene  Community Resource Referral / Chronic Care Management: CRR required this visit?  No   CCM required this visit?  No      Plan:  -Discussed fall prevention strategies. -Discussed 6CIT screenings, no family hx of dementia. Will continue to monitor. -Patient was started on fosamax by endocrinology and reports tolerating medication w/o issues. -Recommend to continue use good support system at home and let me know if considers [place] therapy. -Continue to follow up with endocrinology. -Follow up in 4 months for DM, HTN, HLD  I have personally reviewed and noted the following in the patients chart:   Medical and social history Use of alcohol, tobacco or illicit  drugs  Current medications and supplements including opioid prescriptions.  Functional ability and status Nutritional status Physical activity Advanced directives List of other physicians Hospitalizations, surgeries, and ER visits in previous 12 months Vitals Screenings to include cognitive, depression, and falls Referrals and appointments  In addition, I have reviewed and discussed with patient certain preventive protocols, quality metrics, and best practice recommendations. A written personalized care plan for preventive services as well as general preventive health recommendations were provided to patient.     Lorrene Reid, PA-C   10/01/2021

## 2021-10-09 ENCOUNTER — Ambulatory Visit
Admission: RE | Admit: 2021-10-09 | Discharge: 2021-10-09 | Disposition: A | Discharge status: Home / Self Care | Payer: HMO | Source: Ambulatory Visit | Attending: Nurse Practitioner | Admitting: Nurse Practitioner

## 2021-10-09 DIAGNOSIS — Z1231 Encounter for screening mammogram for malignant neoplasm of breast: Secondary | ICD-10-CM

## 2021-10-10 NOTE — Progress Notes (Signed)
Please let the patient know that she had a negative mammogram. We will repeat this in one year.  Thanks so much.   -HB

## 2021-10-23 ENCOUNTER — Telehealth: Payer: Self-pay

## 2021-10-23 ENCOUNTER — Other Ambulatory Visit: Payer: Self-pay | Admitting: Endocrinology

## 2021-10-23 NOTE — Telephone Encounter (Signed)
Vitamin D Script was last filled 11/21. Please advise if refill is appropriate.

## 2021-10-23 NOTE — Telephone Encounter (Signed)
Message sent to Dr. Dwyane Dee to advise if refill is needed

## 2021-10-24 ENCOUNTER — Other Ambulatory Visit: Payer: Self-pay | Admitting: Endocrinology

## 2021-12-04 ENCOUNTER — Telehealth: Payer: Self-pay

## 2021-12-04 NOTE — Telephone Encounter (Signed)
Pt called in stating her blood sugar dropped in the 60's and she passed out. Wants to come and see you sooner if possible. Her appt is 12/08/21 ?

## 2021-12-05 ENCOUNTER — Other Ambulatory Visit: Payer: Self-pay

## 2021-12-05 ENCOUNTER — Encounter: Payer: Self-pay | Admitting: Endocrinology

## 2021-12-05 ENCOUNTER — Ambulatory Visit (INDEPENDENT_AMBULATORY_CARE_PROVIDER_SITE_OTHER): Payer: HMO | Admitting: Endocrinology

## 2021-12-05 VITALS — BP 140/62 | HR 71 | Ht <= 58 in | Wt 126.0 lb

## 2021-12-05 DIAGNOSIS — Z794 Long term (current) use of insulin: Secondary | ICD-10-CM

## 2021-12-05 DIAGNOSIS — E063 Autoimmune thyroiditis: Secondary | ICD-10-CM

## 2021-12-05 DIAGNOSIS — E559 Vitamin D deficiency, unspecified: Secondary | ICD-10-CM | POA: Diagnosis not present

## 2021-12-05 DIAGNOSIS — E1165 Type 2 diabetes mellitus with hyperglycemia: Secondary | ICD-10-CM | POA: Diagnosis not present

## 2021-12-05 DIAGNOSIS — I10 Essential (primary) hypertension: Secondary | ICD-10-CM | POA: Diagnosis not present

## 2021-12-05 LAB — COMPREHENSIVE METABOLIC PANEL
ALT: 16 U/L (ref 0–35)
AST: 28 U/L (ref 0–37)
Albumin: 4.2 g/dL (ref 3.5–5.2)
Alkaline Phosphatase: 69 U/L (ref 39–117)
BUN: 20 mg/dL (ref 6–23)
CO2: 28 mEq/L (ref 19–32)
Calcium: 9.3 mg/dL (ref 8.4–10.5)
Chloride: 104 mEq/L (ref 96–112)
Creatinine, Ser: 0.8 mg/dL (ref 0.40–1.20)
GFR: 67.63 mL/min (ref >60.00)
Glucose, Bld: 166 mg/dL — ABNORMAL HIGH (ref 70–99)
Potassium: 4.6 mEq/L (ref 3.5–5.1)
Sodium: 139 mEq/L (ref 135–145)
Total Bilirubin: 0.6 mg/dL (ref 0.2–1.2)
Total Protein: 6.7 g/dL (ref 6.0–8.3)

## 2021-12-05 LAB — POCT GLYCOSYLATED HEMOGLOBIN (HGB A1C): Hemoglobin A1C: 6.7 % — AB (ref 4.0–5.6)

## 2021-12-05 LAB — T4, FREE: Free T4: 0.9 ng/dL (ref 0.60–1.60)

## 2021-12-05 LAB — VITAMIN D 25 HYDROXY (VIT D DEFICIENCY, FRACTURES): VITD: 60.18 ng/mL (ref 30.00–100.00)

## 2021-12-05 LAB — TSH: TSH: 4.26 u[IU]/mL (ref 0.35–5.50)

## 2021-12-05 NOTE — Progress Notes (Signed)
Patient ID: Jennifer Bailey, female   DOB: 1935-01-18, 87 y.o.   MRN: [id number]    Chief complaint: follow up    History of Present Illness     Type 2 DIABETES MELITUS, date of diagnosis: 2003  She has been on insulin for several years but continues to have labile blood sugars despite using basal bolus regimen  She is very compliant with her insulin at mealtimes  Is taking small amounts of insulin and is not on any oral hypoglycemics She was changed from Levemir to [place] insulin in 1/17 because of relatively inconsistent control   RECENT history:   Insulin regimen: Tresiba 11 units in a.m.;   mealtime NovoLog coverage: At breakfast: 4-5 units , 3-5  lunch and 3-5 at supper                                  Current blood sugar patterns from freestyle libre, diabetes management and problems identified:  Daily management, problems identified and abnormal patterns are discussed in detail in the CGM analysis   Her [name] is down to 6.7 compared to 7.6  She came in for an acute visit today because of an episode of low blood sugar on Wednesday causing her to pass out at home  She has not changed her diet or insulin regimen She has been told to take her mealtime insulin before starting to eat and she thinks she Vascepa  She is mostly adjusting her breakfast dose based on her Premeal blood sugar  However is only getting some peanut butter with her waffle or pancake in the morning and only rarely getting other kind of protein such as eggs or yogurt  LOWEST blood sugars are usually late morning and before lunch  Has not monitored her fingerstick blood sugars to compare with the libre  She also has had a couple of episodes of low blood sugars on her sensor overnight but not in the last few days  Has not changed her activity level Still has dawn phenomenon at times and fasting readings are mostly higher   Interpretation of her CGM from the freestyle libre 2 for the last 2  weeks   Blood sugars are within the target range at all times with HIGHEST readings around 3 PM and lowest readings around 11 AM on average  Previously highest average readings are after breakfast and lowest late at night  OVERNIGHT blood sugars are on an average in the low 100 range with fairly stable readings usually.  She does show low readings overnight on 2 occasions but blood sugars are usually in the 60s  increased in the early part of the night but generally tend to be rising progressively until after breakfast fairly well controlled with some variability; has had low sugars between 11 PM-3 AM occasionally  HYPOGLYCEMIA has occurred occasionally overnight as above and at times late morning and after P.m.  also after her evening meal and bedtime at times  Hypoglycemia is periodically preceded by high readings especially after dinner POSTPRANDIAL readings are generally well controlled with modest rise after breakfast, overall generally higher readings after lunch but after dinner they are mostly even with the Premeal readings  Today in the office glucose was 164  with her meter at the time her lab was drawn   CGM use % of time 93  2-week average/GV 133  Time in range  76%  % Time Above 180 15+4  % Time above 250   % Time Below 70 5     PRE-MEAL Fasting Lunch Dinner Bedtime Overall  Glucose range:       Averages: 126 108 134 116    POST-MEAL PC Breakfast PC Lunch PC Dinner  Glucose range:     Averages: 146 172 156   Previously:   CGM use % of time 98  2-week average/GV 180/26  Time in range 52     %  % Time Above 180 31+16  % Time above 250   % Time Below 70 1     PRE-MEAL Fasting Lunch Dinner Bedtime Overall  Glucose range:       Averages: 174   142 184   POST-MEAL PC Breakfast PC Lunch PC Dinner  Glucose range:     Averages: 242 225 188     Symptoms with hypoglycemia: feels weak and shaky Low sugars are treated with Soft drink or glucose tablets       Meals: 3 meals per day.  Usually has small portions. Eggs/bread or one pancake with protein at 6:30 AM-7 am; has mid morning snack.  Has hs snack if blood sugar not high Evening meal usually balanced at 5 pm with 1-2 carbohydrates  Physical activity: exercise: Some walking and yardwork         Dietician visit: Most recent: 2004            Wt Readings from Last 3 Encounters:  12/05/21 126 lb (57.2 kg)  10/01/21 126 lb (57.2 kg)  09/01/21 128 lb (42.6 kg)   Complications: are: Mild neuropathy controlled symptomatically  Lab Results  Component Value Date   HGBA1C 6.7 (A) 12/05/2021   HGBA1C 7.6 (H) 08/25/2021   HGBA1C 7.1 (H) 05/19/2021   Lab Results  Component Value Date   MICROALBUR 1.3 08/25/2021   LDLCALC 57 08/25/2021   CREATININE 0.82 08/25/2021    HYPERTENSION:  this has been mild and treated with 50 mg losartan.   Periodically checks at home also  BP Readings from Last 3 Encounters:  12/05/21 140/62  10/01/21 138/70  09/01/21 (!) 124/54    OSTEOPENIA:  She has had osteopenia with T score -2.3 at the hip in 3/09 and was started on Fosamax at that time. This was stopped in 02/2014.   Fosamax was restarted in 08/2021 since bone density was slightly lower  Last bone density in 11/22 compared to 2016: DualFemur Neck Right 10/03/2015    T score -2.1 compared to   -1.9 T score is -2.4 at the radius  She had a traumatic fracture of her tibial plateau on 12/02/2020  History of vitamin D Deficiency, she is taking 50,000 units monthly with adequate levels  Lab Results  Component Value Date   VD25OH 53.02 08/25/2021   VD25OH 50.06 02/10/2021    OTHER active problems: See review of systems   LABS:    Office Visit on 12/05/2021  Component Date Value Ref Range Status   Hemoglobin [name] 12/05/2021 6.7 (A)  4.0 - 5.6 % Final    Allergies as of 12/05/2021       Reactions   Macrodantin [nitrofurantoin Macrocrystal] Swelling   Prednisone    Make real  "shaky-like, nervious"   Penicillins Rash   Has patient had a PCN reaction causing immediate rash, facial/tongue/throat swelling, SOB or lightheadedness with hypotension:Yes Has patient had a PCN reaction causing severe rash involving mucus membranes or skin necrosis:  No Has patient had a PCN reaction that required hospitalization: No Has patient had a PCN reaction occurring within the last 10 years:No If all of the above answers are "NO", then may proceed with Cephalosporin use.        Medication List        Accurate as of December 05, 2021  9:49 AM. If you have any questions, ask your nurse or doctor.          Alcohol Pads 70 % Pads USE 4 PER DAY   alendronate 70 MG tablet Commonly known as: FOSAMAX Take 1 tablet (70 mg total) by mouth every 7 (seven) days. Take with a full glass of water on an empty stomach.   Alphagan P 0.1 % Soln Generic drug: brimonidine Place 1 drop into the left eye 2 (two) times daily.   aspirin 81 MG tablet Take 1 tablet (81 mg total) by mouth daily.   BD Pen Needle Nano U/F 32G X 4 MM Misc Generic drug: Insulin Pen Needle USE AS DIRECTED FIVE TIMES A DAY.   cycloSPORINE 0.05 % ophthalmic emulsion Commonly known as: RESTASIS 1 drop 2 (two) times daily.   dorzolamidel-timolol 22.3-6.8 MG/ML Soln ophthalmic solution Commonly known as: COSOPT Place 1 drop into the left eye 2 (two) times daily.   enoxaparin 40 MG/0.4ML injection Commonly known as: LOVENOX Inject 0.4 mLs (40 mg total) into the skin daily for 28 days.   FreeStyle Libre 14 Day Reader Kerrin Mo USE DAILY TO CHECK BLOOD SUGAR   FreeStyle Libre 2 Sensor Misc APPLY TO ARM EVERY 14 DAYS TO MONITOR BLOOD SUGAR.   gabapentin 100 MG capsule Commonly known as: NEURONTIN TAKE 3 TO 4 CAPSULES BY MOUTH AT BEDTIME.   HYDROcodone-acetaminophen 7.5-325 MG tablet Commonly known as: NORCO Take 1 tablet by mouth every 6 (six) hours as needed for moderate pain or severe pain.   levothyroxine  88 MCG tablet Commonly known as: SYNTHROID Take 1 tablet (88 mcg total) by mouth daily.   losartan 50 MG tablet Commonly known as: COZAAR TAKE 1 TABLET BY MOUTH DAILY.   lovastatin 40 MG tablet Commonly known as: MEVACOR TAKE 1 TABLET BY MOUTH ONCE DAILY WITH A MEAL.   NovoLOG FlexPen 100 UNIT/ML FlexPen Generic drug: insulin aspart INJECT 3 TO 5 UNITS SUBCUTANEOUSLY BEFORE MEALS AS DIRECTED.   Omega-3 1000 MG Caps Take 1 capsule by mouth 4 (four) times daily.   OneTouch Verio test strip Generic drug: glucose blood USE TO TEST BLOOD SUGAR 4 TIMES DAILY   pantoprazole 40 MG tablet Commonly known as: PROTONIX Take 40 mg by mouth 2 (two) times daily as needed (indigestion).   polyethylene glycol 17 g packet Commonly known as: MIRALAX / GLYCOLAX Take 17 g by mouth daily as needed for mild constipation. If not relieved with senokot   potassium chloride SA 20 MEQ tablet Commonly known as: KLOR-CON M Take 1 tablet (20 mEq total) by mouth daily as needed. Take with water pill- Lasix.   PreserVision AREDS 2 Caps Take 1 capsule by mouth 2 (two) times daily.   senna-docusate 8.6-50 MG tablet Commonly known as: Senokot-S Take 1 tablet by mouth 2 (two) times daily. While on narcotics   Tresiba FlexTouch 100 UNIT/ML FlexTouch Pen Generic drug: insulin degludec INJECT 0.11 MLS (11 UNITS TOTAL) INTO THE SKIN DAILY. What changed: See the new instructions.   vitamin C 100 MG tablet Take 500 mg by mouth daily.   Vitamin D (Ergocalciferol) 1.25 MG (50000 UNIT)  Caps capsule Commonly known as: DRISDOL TAKE 1 CAPSULE BY MOUTH ONCE A MONTH.        Allergies:  Allergies  Allergen Reactions   Macrodantin [Nitrofurantoin Macrocrystal] Swelling   Prednisone     Make real "shaky-like, nervious"   Penicillins Rash    Has patient had a PCN reaction causing immediate rash, facial/tongue/throat swelling, SOB or lightheadedness with hypotension:Yes Has patient had a PCN reaction  causing severe rash involving mucus membranes or skin necrosis: No Has patient had a PCN reaction that required hospitalization: No Has patient had a PCN reaction occurring within the last 10 years:No If all of the above answers are "NO", then may proceed with Cephalosporin use.     Past Medical History:  Diagnosis Date   Diabetes mellitus without complication ([place])    Thyroid disease     Past Surgical History:  Procedure Laterality Date   ABDOMINAL HYSTERECTOMY     CHOLECYSTECTOMY     COLON SURGERY     ORIF TIBIA PLATEAU Right 12/04/2020   Procedure: OPEN REDUCTION INTERNAL FIXATION (ORIF) TIBIAL PLATEAU;  Surgeon: Shona Needles, MD;  Location: [facility];  Service: Orthopedics;  Laterality: Right;    Family History  Problem Relation Age of Onset   Cancer Mother    Diabetes Brother     Social History:  reports that she has never smoked. She has never used smokeless tobacco. She reports that she does not drink alcohol and does not use drugs.   Review of Systems   Peripheral NEUROPATHY with sensory symptoms: Treated with gabapentin taking at bedtime with adequate relief  HYPOTHYROIDISM: She has had primary hypothyroidism, long-standing .  No symptoms of fatigue Her TSH is consistently normal, taking 88 micrograms levothyroxine daily  Lab Results  Component Value Date   TSH 3.79 08/25/2021   TSH 2.84 05/19/2021   TSH 7.83 (H) 02/10/2021   FREET4 1.01 08/25/2021   FREET4 1.14 05/19/2021   FREET4 1.05 02/10/2021       HYPERLIPIDEMIA: The lipid abnormality consists of elevated LDL,  taking lovastatin with consistently good control, last level below 70.  Lab Results  Component Value Date   CHOL 133 08/25/2021   HDL 50.60 08/25/2021   LDLCALC 57 08/25/2021   TRIG 125.0 08/25/2021   CHOLHDL 3 08/25/2021     She has had her Covid booster and flu shots   Examination:   BP 140/62    Pulse 71    Ht 4\' 10"  (1.473 m)    Wt 126 lb (57.2 kg)    SpO2 97%    BMI 26.33 kg/m    Body mass index is 26.33 kg/m.       ASSESSMENT/ PLAN:    Diabetes type 2 on insulin   See history of present illness for detailed discussion of current management, blood sugar patterns and problems identified  Her [name] is lower than usual at 7.6  She is on basal bolus insulin with relatively low insulin doses  Her blood sugars are averaging 133 at home and 76% within target However has somewhat more frequent low sugars although not at consistent times  Also not clear if her sensor is accurate compared to actual glucose   She came in today because of an episode of low sugar before lunchtime and tends to have lower readings before lunch than any other time This is likely from her not getting much protein at breakfast as well as sometimes being more active late morning Also likely needs  less insulin to cover her breakfast   Recommendations:  If her lab glucose is similar to her sensor reading will again reduce her Tyler Aas to 10 units She may need to increase it further if fasting readings continue to be high  She will try to take her NOVOLOG before starting to eat Also will make her reduce her morning dose by 1 unit and take 3-4 instead of 4-5  Take the same doses at other meals Make sure she takes a midmorning snack especially if planning to be active  Hypertension:  Her blood pressure is controlled She will continue 50 mg of losartan   Edema: She needs to take Lasix 20 mg every other day at because of persistent edema   Hypothyroidism :her TSH will be checked today  History of osteoporosis: We will recheck her vitamin D level today  LIPIDS: Well-controlled on lovastatin  Follow-up in 3 months      Patient Instructions  Reduce Novolog in am to 3-4 intead of 4-5  Mid mormorning snack daily  Add protein at breakfast: daily or meat  She can have labs on the same day as her office visit  Elayne Snare 12/05/2021, 9:49 AM

## 2021-12-05 NOTE — Patient Instructions (Addendum)
Reduce Novolog in am to 3-4 intead of 4-5 ? ?Mid mormorning snack daily ? ?Add protein at breakfast: daily or meat ? ?Furosemide every 2 days ?

## 2021-12-08 ENCOUNTER — Ambulatory Visit: Payer: HMO | Admitting: Endocrinology

## 2021-12-08 NOTE — Progress Notes (Signed)
Please call to let patient know that the lab results are normal.  Lab blood sugar was 166, same as her libre reading and will need to reduce her Tyler Aas to 10 units

## 2021-12-18 ENCOUNTER — Encounter: Payer: Self-pay | Admitting: Endocrinology

## 2022-01-05 ENCOUNTER — Other Ambulatory Visit: Payer: Self-pay | Admitting: Endocrinology

## 2022-01-05 DIAGNOSIS — Z794 Long term (current) use of insulin: Secondary | ICD-10-CM | POA: Diagnosis not present

## 2022-01-05 DIAGNOSIS — E119 Type 2 diabetes mellitus without complications: Secondary | ICD-10-CM | POA: Diagnosis not present

## 2022-01-05 DIAGNOSIS — H04123 Dry eye syndrome of bilateral lacrimal glands: Secondary | ICD-10-CM | POA: Diagnosis not present

## 2022-01-05 DIAGNOSIS — H40011 Open angle with borderline findings, low risk, right eye: Secondary | ICD-10-CM | POA: Diagnosis not present

## 2022-01-05 DIAGNOSIS — H401221 Low-tension glaucoma, left eye, mild stage: Secondary | ICD-10-CM | POA: Diagnosis not present

## 2022-01-05 DIAGNOSIS — H353131 Nonexudative age-related macular degeneration, bilateral, early dry stage: Secondary | ICD-10-CM | POA: Diagnosis not present

## 2022-01-27 ENCOUNTER — Ambulatory Visit (INDEPENDENT_AMBULATORY_CARE_PROVIDER_SITE_OTHER): Payer: HMO | Admitting: Physician Assistant

## 2022-01-27 ENCOUNTER — Encounter: Payer: Self-pay | Admitting: Physician Assistant

## 2022-01-27 VITALS — BP 129/62 | HR 68 | Temp 98.2°F | Wt 129.0 lb

## 2022-01-27 DIAGNOSIS — R600 Localized edema: Secondary | ICD-10-CM | POA: Diagnosis not present

## 2022-01-27 DIAGNOSIS — E1165 Type 2 diabetes mellitus with hyperglycemia: Secondary | ICD-10-CM

## 2022-01-27 DIAGNOSIS — E1169 Type 2 diabetes mellitus with other specified complication: Secondary | ICD-10-CM | POA: Diagnosis not present

## 2022-01-27 DIAGNOSIS — E1159 Type 2 diabetes mellitus with other circulatory complications: Secondary | ICD-10-CM | POA: Diagnosis not present

## 2022-01-27 DIAGNOSIS — I152 Hypertension secondary to endocrine disorders: Secondary | ICD-10-CM | POA: Diagnosis not present

## 2022-01-27 DIAGNOSIS — G4700 Insomnia, unspecified: Secondary | ICD-10-CM

## 2022-01-27 DIAGNOSIS — E063 Autoimmune thyroiditis: Secondary | ICD-10-CM

## 2022-01-27 DIAGNOSIS — E785 Hyperlipidemia, unspecified: Secondary | ICD-10-CM | POA: Diagnosis not present

## 2022-01-27 MED ORDER — FUROSEMIDE 20 MG PO TABS: ORAL_TABLET | ORAL | 1 refills | Status: DC

## 2022-01-27 NOTE — Assessment & Plan Note (Signed)
-  Followed by endocrinology. ?-Recent thyroid labs wnl's. ?-Continue current medication regimen. ?

## 2022-01-27 NOTE — Patient Instructions (Signed)
Peripheral Edema  Peripheral edema is swelling that is caused by a buildup of fluid. Peripheral edema most often affects the lower legs, ankles, and feet. It can also develop in the arms, hands, and face. The area of the body that has peripheral edema will look swollen. It may also feel heavy or warm. Your clothes may start to feel tight. Pressing on the area may make a temporary dent in your skin (pitting edema). You may not be able to move your swollen arm or leg as much as usual. There are many causes of peripheral edema. It can happen because of a complication of other conditions such as heart failure, kidney disease, or a problem with your circulation. It also can be a side effect of certain medicines or happen because of an infection. It often happens to women during pregnancy. Sometimes, the cause is not known. Follow these instructions at home: Managing pain, stiffness, and swelling  Raise (elevate) your legs while you are sitting or lying down. Move around often to prevent stiffness and to reduce swelling. Do not sit or stand for long periods of time. Do not wear tight clothing. Do not wear garters on your upper legs. Exercise your legs to get your circulation going. This helps to move the fluid back into your blood vessels, and it may help the swelling go down. Wear compression stockings as told by your health care provider. These stockings help to prevent blood clots and reduce swelling in your legs. It is important that these are the correct size. These stockings should be prescribed by your doctor to prevent possible injuries. If elastic bandages or wraps are recommended, use them as told by your health care provider. Medicines Take over-the-counter and prescription medicines only as told by your health care provider. Your health care provider may prescribe medicine to help your body get rid of excess water (diuretic). Take this medicine if you are told to take it. General  instructions Eat a low-salt (low-sodium) diet as told by your health care provider. Sometimes, eating less salt may reduce swelling. Pay attention to any changes in your symptoms. Moisturize your skin daily to help prevent skin from cracking and draining. Keep all follow-up visits. This is important. Contact a health care provider if: You have a fever. You have swelling in only one leg. You have increased swelling, redness, or pain in one or both of your legs. You have drainage or sores at the area where you have edema. Get help right away if: You have edema that starts suddenly or is getting worse, especially if you are pregnant or have a medical condition. You develop shortness of breath, especially when you are lying down. You have pain in your chest or abdomen. You feel weak. You feel like you will faint. These symptoms may be an emergency. Get help right away. Call 911. Do not wait to see if the symptoms will go away. Do not drive yourself to the hospital. Summary Peripheral edema is swelling that is caused by a buildup of fluid. Peripheral edema most often affects the lower legs, ankles, and feet. Move around often to prevent stiffness and to reduce swelling. Do not sit or stand for long periods of time. Pay attention to any changes in your symptoms. Contact a health care provider if you have edema that starts suddenly or is getting worse, especially if you are pregnant or have a medical condition. Get help right away if you develop shortness of breath, especially when lying down.   This information is not intended to replace advice given to you by your health care provider. Make sure you discuss any questions you have with your health care provider. Document Revised: 05/26/2021 Document Reviewed: 05/26/2021 Elsevier Patient Education  2023 [employer]  

## 2022-01-27 NOTE — Assessment & Plan Note (Signed)
>>  ASSESSMENT AND PLAN FOR TYPE II DIABETES MELLITUS, UNCONTROLLED WRITTEN ON 01/27/2022  3:20 PM BY ABONZA, MARITZA, PA-C  -Followed by endocrinology. -Recent [name] improved from 7.6 to 6.7. -Recommend to continue current medication regimen, see med list. -Discussed low hypoglycemia management. -Continue to follow-up with endocrinology.

## 2022-01-27 NOTE — Assessment & Plan Note (Signed)
-  Last lipid panel: HDL 50.6, LDL 57 ?-Continue current medication regimen.  ?-Will continue to monitor. ?

## 2022-01-27 NOTE — Assessment & Plan Note (Signed)
-  Followed by endocrinology. ?-Recent [name] improved from 7.6 to 6.7. ?-Recommend to continue current medication regimen, see med list. ?-Discussed low hypoglycemia management. ?-Continue to follow-up with endocrinology. ?

## 2022-01-27 NOTE — Progress Notes (Signed)
?Established patient visit ? ? ?Patient: Jennifer Bailey   DOB: 01-20-1935   87 y.o. Female  MRN: [id number] ?Visit Date: 01/27/2022 ? ?Chief Complaint  ?Patient presents with  ? Follow-up  ?  Mood  ? Diabetes  ? Hypertension  ? ?Subjective  ?  ?HPI ?HPI   ? ? Follow-up   ? Additional comments: Mood ? ?  ?  ?Last edited by Aron Baba, CMA on 01/27/2022  2:48 PM.  ?  ?  ?Patient presents for chronic f/up. Patient reports having trouble with falling asleep. Reports takes over-the-counter medication which helps, unable to recall medication name.  ? ?Diabetes mellitus: Followed by Endocrinology. Pt denies increased urination or thirst. Pt reports medication compliance. Tyler Aas was recently reduced to 10 units. Taking Novolog 3-4 units before meals. Checking glucose at home with [facility] Sugars have been fluctuating. States lowest it has been was in the 50s and she took glucose tablet. Then other times her sugar will run in the 200s.  ? ?HTN: Pt denies chest pain, palpitations, dizziness or shortness of breath. Taking medication as directed without side effects. Occasionally checks BP at home.  ? ?Lasix: Patient reports taking medication about every 2 days. States follows a low sodium diet.  ? ?HLD: Pt taking medication as directed without problems. No changes with diet. ? ?Thyroid: Reports taking Levothyroxine 75 mcg. Denies heat/cold intolerance, fatigue or palpitations. ? ?Medications: ?Outpatient Medications Prior to Visit  ?Medication Sig  ? Alcohol Swabs (ALCOHOL PADS) 70 % PADS USE 4 PER DAY  ? alendronate (FOSAMAX) 70 MG tablet Take 1 tablet (70 mg total) by mouth every 7 (seven) days. Take with a full glass of water on an empty stomach.  ? Ascorbic Acid (VITAMIN C) 100 MG tablet Take 500 mg by mouth daily.   ? aspirin 81 MG tablet Take 1 tablet (81 mg total) by mouth daily.  ? BD PEN NEEDLE NANO U/F 32G X 4 MM MISC USE AS DIRECTED FIVE TIMES A DAY.  ? brimonidine (ALPHAGAN P) 0.1 % SOLN Place 1  drop into the left eye 2 (two) times daily.  ? Continuous Blood Gluc Receiver (FREESTYLE LIBRE 14 DAY READER) DEVI USE DAILY TO CHECK BLOOD SUGAR  ? Continuous Blood Gluc Sensor (FREESTYLE LIBRE 2 SENSOR) MISC APPLY TO ARM EVERY 14 DAYS TO MONITOR BLOOD SUGAR.  ? cycloSPORINE (RESTASIS) 0.05 % ophthalmic emulsion 1 drop 2 (two) times daily.  ? dorzolamidel-timolol (COSOPT) 22.3-6.8 MG/ML SOLN ophthalmic solution Place 1 drop into the left eye 2 (two) times daily.  ? gabapentin (NEURONTIN) 100 MG capsule TAKE 3 TO 4 CAPSULES BY MOUTH AT BEDTIME.  ? levothyroxine (SYNTHROID) 75 MCG tablet tablet  ? losartan (COZAAR) 50 MG tablet TAKE 1 TABLET BY MOUTH DAILY.  ? lovastatin (MEVACOR) 40 MG tablet TAKE 1 TABLET BY MOUTH ONCE DAILY WITH A MEAL.  ? Multiple Vitamins-Minerals (PRESERVISION AREDS 2) CAPS Take 1 capsule by mouth 2 (two) times daily.  ? NOVOLOG FLEXPEN 100 UNIT/ML FlexPen INJECT 3 TO 5 UNITS SUBCUTANEOUSLY BEFORE MEALS AS DIRECTED. (Patient taking differently: Using  3-4 units)  ? Omega-3 1000 MG CAPS Take 1 capsule by mouth 4 (four) times daily.  ? ONETOUCH VERIO test strip USE TO TEST BLOOD SUGAR 4 TIMES DAILY  ? pantoprazole (PROTONIX) 40 MG tablet Take 40 mg by mouth 2 (two) times daily as needed (indigestion). (Patient not taking: Reported on 01/27/2022)  ? potassium chloride SA (KLOR-CON) 20 MEQ tablet Take 1 tablet (20  mEq total) by mouth daily as needed. Take with water pill- Lasix.  ? senna-docusate (SENOKOT-S) 8.6-50 MG tablet Take 1 tablet by mouth 2 (two) times daily. While on narcotics  ? TRESIBA FLEXTOUCH 100 UNIT/ML FlexTouch Pen INJECT 0.11 MLS (11 UNITS TOTAL) INTO THE SKIN DAILY. (Patient taking differently: 10 Units.)  ? Vitamin D, Ergocalciferol, (DRISDOL) 1.25 MG (50000 UNIT) CAPS capsule TAKE 1 CAPSULE BY MOUTH ONCE A MONTH.  ? [DISCONTINUED] HYDROcodone-acetaminophen (NORCO) 7.5-325 MG tablet Take 1 tablet by mouth every 6 (six) hours as needed for moderate pain or severe pain.  ?  [DISCONTINUED] levothyroxine (SYNTHROID) 88 MCG tablet Take 1 tablet (88 mcg total) by mouth daily.  ? [DISCONTINUED] polyethylene glycol (MIRALAX / GLYCOLAX) 17 g packet Take 17 g by mouth daily as needed for mild constipation. If not relieved with senokot  ? ?No facility-administered medications prior to visit.  ? ? ?Review of Systems ?Review of Systems:  ?A fourteen system review of systems was performed and found to be positive as per HPI. ? ? ?  Objective  ?  ?BP 129/62   Pulse 68   Temp 98.2 ?F (36.8 ?C)   Wt 129 lb (58.5 kg)   SpO2 98%   BMI 26.96 kg/m?  ?BP Readings from Last 3 Encounters:  ?01/27/22 129/62  ?12/05/21 140/62  ?10/01/21 138/70  ? ?Wt Readings from Last 3 Encounters:  ?01/27/22 129 lb (58.5 kg)  ?12/05/21 126 lb (57.2 kg)  ?10/01/21 126 lb (57.2 kg)  ? ? ?Physical Exam  ?General:  Well Developed, well nourished, appropriate for stated age.  ?Neuro:  Alert and oriented,  extra-ocular muscles intact  ?HEENT:  Normocephalic, atraumatic, neck supple  ?Skin:  no gross rash, warm, pink. ?Cardiac:  RRR, S1 S2 ?Respiratory: CTA B/L  ?Vascular:  Ext warm, no cyanosis apprec.; cap RF less 2 sec. ?Psych:  No HI/SI, judgement and insight good, Euthymic mood. Full Affect. ? ? ?No results found for any visits on 01/27/22. ? Assessment & Plan  ?  ? ? ?Problem List Items Addressed This Visit   ? ?  ? Cardiovascular and Mediastinum  ? Hypertension associated with diabetes ([place]) - Primary  ?  -Controlled. ?-Continue current medication regimen, see med list. ?-Recent CMP renal function and electrolytes normal.  ?-Will continue to monitor. ? ?  ?  ? Relevant Medications  ? furosemide (LASIX) 20 MG tablet  ?  ? Endocrine  ? Hyperlipidemia associated with type 2 diabetes mellitus ([facility]) (Chronic)  ?  -Last lipid panel: HDL 50.6, LDL 57 ?-Continue current medication regimen.  ?-Will continue to monitor. ? ?  ?  ? Relevant Medications  ? furosemide (LASIX) 20 MG tablet  ? Acquired autoimmune hypothyroidism  ?   -Followed by endocrinology. ?-Recent thyroid labs wnl's. ?-Continue current medication regimen. ? ?  ?  ? Relevant Medications  ? levothyroxine (SYNTHROID) 75 MCG tablet  ? Type II diabetes mellitus, uncontrolled  ?  -Followed by endocrinology. ?-Recent [name] improved from 7.6 to 6.7. ?-Recommend to continue current medication regimen, see med list. ?-Discussed low hypoglycemia management. ?-Continue to follow-up with endocrinology. ? ?  ?  ? ?Other Visit Diagnoses   ? ? Bilateral lower extremity edema      ? Relevant Medications  ? furosemide (LASIX) 20 MG tablet  ? Insomnia, unspecified type      ? ?  ? ?Bilateral lower extremity edema: ?-Discussed with patient taking Lasix every other day, provided rx. Advised to continue with potassium  supplement when taking diuretic. Recommend to use compression socks and elevation. Recent CMP, renal function and electrolytes normal. Will repeat CMP at f/up visit for medication monitoring.  ? ?Insomnia: ?-Discussed good sleep hygiene. Recommend to bring  ?OTC sleep medication to update med list at f/up visit. ? ?Return in about 4 months (around 05/29/2022) for DM, edema, HTN and FBW.  ?   ? ? ? ?Lorrene Reid, PA-C  ?[facility] ?[phone] (phone) ?[phone] (fax) ? ?[facility] ?

## 2022-01-27 NOTE — Assessment & Plan Note (Signed)
-  Controlled. ?-Continue current medication regimen, see med list. ?-Recent CMP renal function and electrolytes normal.  ?-Will continue to monitor. ?

## 2022-02-09 ENCOUNTER — Ambulatory Visit (INDEPENDENT_AMBULATORY_CARE_PROVIDER_SITE_OTHER): Payer: HMO | Admitting: Endocrinology

## 2022-02-09 ENCOUNTER — Encounter: Payer: Self-pay | Admitting: Endocrinology

## 2022-02-09 VITALS — BP 136/56 | HR 70 | Ht <= 58 in | Wt 129.0 lb

## 2022-02-09 DIAGNOSIS — E063 Autoimmune thyroiditis: Secondary | ICD-10-CM | POA: Diagnosis not present

## 2022-02-09 DIAGNOSIS — R5383 Other fatigue: Secondary | ICD-10-CM

## 2022-02-09 DIAGNOSIS — E1165 Type 2 diabetes mellitus with hyperglycemia: Secondary | ICD-10-CM

## 2022-02-09 DIAGNOSIS — Z794 Long term (current) use of insulin: Secondary | ICD-10-CM

## 2022-02-09 DIAGNOSIS — I10 Essential (primary) hypertension: Secondary | ICD-10-CM

## 2022-02-09 LAB — BASIC METABOLIC PANEL
BUN: 23 mg/dL (ref 6–23)
CO2: 27 mEq/L (ref 19–32)
Calcium: 9.5 mg/dL (ref 8.4–10.5)
Chloride: 102 mEq/L (ref 96–112)
Creatinine, Ser: 0.86 mg/dL (ref 0.40–1.20)
GFR: 61.93 mL/min (ref >60.00)
Glucose, Bld: 111 mg/dL — ABNORMAL HIGH (ref 70–99)
Potassium: 4.4 mEq/L (ref 3.5–5.1)
Sodium: 137 mEq/L (ref 135–145)

## 2022-02-09 LAB — CBC WITH DIFFERENTIAL/PLATELET
Basophils Absolute: 0.1 10*3/uL (ref 0.0–0.1)
Basophils Relative: 0.7 % (ref 0.0–3.0)
Eosinophils Absolute: 0.6 10*3/uL (ref 0.0–0.7)
Eosinophils Relative: 7.7 % — ABNORMAL HIGH (ref 0.0–5.0)
HCT: 32.9 % — ABNORMAL LOW (ref 36.0–46.0)
Hemoglobin: 11.2 g/dL — ABNORMAL LOW (ref 12.0–15.0)
Lymphocytes Relative: 29.7 % (ref 12.0–46.0)
Lymphs Abs: 2.3 10*3/uL (ref 0.7–4.0)
MCHC: 34 g/dL (ref 30.0–36.0)
MCV: 89.9 fl (ref 78.0–100.0)
Monocytes Absolute: 0.8 10*3/uL (ref 0.1–1.0)
Monocytes Relative: 10 % (ref 3.0–12.0)
Neutro Abs: 4.1 10*3/uL (ref 1.4–7.7)
Neutrophils Relative %: 51.9 % (ref 43.0–77.0)
Platelets: 275 10*3/uL (ref 150.0–400.0)
RBC: 3.66 Mil/uL — ABNORMAL LOW (ref 3.87–5.11)
RDW: 13.6 % (ref 11.5–15.5)
WBC: 7.9 10*3/uL (ref 4.0–10.5)

## 2022-02-09 NOTE — Patient Instructions (Signed)
Tresiba 11 and if sugars > 180 overnight in 1 week then go 12 ? ?For high sugars take 1 Unit per 100 pts it is high ?

## 2022-02-09 NOTE — Progress Notes (Signed)
? ? ?Patient ID: Jennifer Bailey, female   DOB: 1934/12/08, 87 y.o.   MRN: [id number] ? ? ? ?Chief complaint: follow up ? ?  ?History of Present Illness  ? ? ? ?Type 2 DIABETES MELITUS, date of diagnosis: 2003 ? ?She has been on insulin for several years but continues to have labile blood sugars despite using basal bolus regimen ? ?She is very compliant with her insulin at mealtimes ? ?Is taking small amounts of insulin and is not on any oral hypoglycemics ?She was changed from [place]mir to [place] insulin in 1/17 because of relatively inconsistent control  ? ?RECENT history:  ? ?Insulin regimen: Tresiba 10 units in a.m.;   mealtime NovoLog coverage: At breakfast: 4-5 units , 3-5  lunch and 3-5 at supper   ?                               ?Current blood sugar patterns from freestyle libre, diabetes management and problems identified: ? ?Daily management, problems identified and abnormal patterns are discussed in detail in the CGM analysis  ? ?Her [name] is last 6.7 ?No recent labs available ?She has much more labile blood sugars in the last 2 weeks at least and not clear why ?Her Tyler Aas was reduced on the last visit ?With this she may be getting overall much higher readings Premeal especially overnight ?She tends to take up to 4 units of extra NovoLog even overnight or early morning when her blood sugars are significantly high and this may cause low normal or low sugars subsequently ?Usually will get a significant rebound of low blood sugars when they are low normal ?As before her postprandial readings are highly variable and occasionally has significantly increased after breakfast or lunch ?Generally blood sugars are lower after dinner ?She is reporting taking the NovoLog consistently before starting to eat ?She may not be getting enough protein at times at breakfast ?She is doing some home will do exercises ?Her weight is about the same ? ?Interpretation of her CGM from the freestyle libre 2 for the last 2  weeks ? ? ?Blood sugars are generally higher compared to her last visit with time in range only 48% compared to 76  ?Overall blood sugars are on an average of about the same except somewhat higher after lunch  ?Moderate variability is present especially midday and afternoon when they can be significantly higher ? ?OVERNIGHT blood sugars are on an average close to 180 but may range anywhere between 150-200 average  ? ?HYPOGLYCEMIA has occurred occasionally and transiently early morning, midday or after 9 PM but usually infrequent  ?Blood sugars may be low normal early morning sometimes preceded by a high reading  ? ?POSTPRANDIAL readings are on an average.  Again compared to the Premeal readings but will sporadically show a significant rise especially if the Premeal blood sugar is low normal  ? ?CGM use % of time 98  ?2-week average/GV 188/35  ?Time in range      48%  ?% Time Above 180 37+ 15  ?% Time above 250   ?% Time Below 70 0  ? ?  ?PRE-MEAL Fasting Lunch Dinner Bedtime Overall  ?Glucose range:       ?Averages: 181 201 213 156   ? ?POST-MEAL PC Breakfast PC Lunch PC Dinner  ?Glucose range:     ?Averages: 189 242 195  ? ? ?Previously ? ? ?CGM use % of  time 55  ?2-week average/GV 133  ?Time in range        76%  ?% Time Above 180 15+4  ?% Time above 250   ?% Time Below 70 5  ? ?  ?PRE-MEAL Fasting Lunch Dinner Bedtime Overall  ?Glucose range:       ?Averages: 126 108 134 116   ? ?POST-MEAL PC Breakfast PC Lunch PC Dinner  ?Glucose range:     ?Averages: 146 172 156  ? ? ? ? ? ?Symptoms with hypoglycemia: feels weak and shaky ?Low sugars are treated with Soft drink or glucose tablets ?     ?Meals: 3 meals per day.  Usually has small portions. Eggs/bread or one pancake with protein at 6:30 AM-7 am; has mid morning snack.  Has hs snack if blood sugar not high ?Evening meal usually balanced at 5 pm with 1-2 carbohydrates ? ?Physical activity: exercise: Some walking and yardwork ?        ?Dietician visit: Most recent:  2004           ? ?Wt Readings from Last 3 Encounters:  ?02/09/22 129 lb (58.5 kg)  ?01/27/22 129 lb (58.5 kg)  ?12/05/21 126 lb (57.2 kg)  ? ?Complications: are: Mild neuropathy controlled symptomatically ? ?Lab Results  ?Component Value Date  ? HGBA1C 6.7 (A) 12/05/2021  ? HGBA1C 7.6 (H) 08/25/2021  ? HGBA1C 7.1 (H) 05/19/2021  ? ?Lab Results  ?Component Value Date  ? MICROALBUR 1.3 08/25/2021  ? Aurelia 57 08/25/2021  ? CREATININE 0.80 12/05/2021  ? ? ?HYPERTENSION:  this has been mild and treated with 50 mg losartan.   ?Periodically checks at home also ? ?BP Readings from Last 3 Encounters:  ?02/09/22 (!) 136/56  ?01/27/22 129/62  ?12/05/21 140/62  ? ? ?OSTEOPENIA: ? She has had osteopenia with T score -2.3 at the hip in 3/09 and was started on Fosamax at that time. ?This was stopped in 02/2014.  ? ?Fosamax was restarted in 08/2021 since bone density was slightly lower ? ?Last bone density in 11/22 compared to 2016: ?DualFemur Neck Right 10/03/2015    T score -2.1 compared to   -1.9 ?T score is -2.4 at the radius ? ?She had a traumatic fracture of her tibial plateau on 12/02/2020 ? ?History of vitamin D Deficiency, she is taking 50,000 units monthly with adequate levels ? ?Lab Results  ?Component Value Date  ? VD25OH 60.18 12/05/2021  ? VD25OH 53.02 08/25/2021  ? ? ?OTHER active problems: See review of systems ? ? ?LABS: ? ? ? ?No visits with results within 1 Week(s) from this visit.  ?Latest known visit with results is:  ?Office Visit on 12/05/2021  ?Component Date Value Ref Range Status  ? Hemoglobin [name] 12/05/2021 6.7 (A)  4.0 - 5.6 % Final  ? VITD 12/05/2021 60.18  30.00 - 100.00 ng/mL Final  ? Free T4 12/05/2021 0.90  0.60 - 1.60 ng/dL Final  ? Comment: Specimens from patients who are undergoing biotin therapy and /or ingesting biotin supplements may contain high levels of biotin.  The higher biotin concentration in these specimens interferes with this Free T4 assay.  Specimens that contain high levels  ?of  biotin may cause false high results for this Free T4 assay.  Please interpret results in light of the total clinical presentation of the patient.  ?  ? TSH 12/05/2021 4.26  0.35 - 5.50 uIU/mL Final  ? Sodium 12/05/2021 139  135 - 145 mEq/L Final  ?  Potassium 12/05/2021 4.6  3.5 - 5.1 mEq/L Final  ? Chloride 12/05/2021 104  96 - 112 mEq/L Final  ? CO2 12/05/2021 28  19 - 32 mEq/L Final  ? Glucose, Bld 12/05/2021 166 (H)  70 - 99 mg/dL Final  ? BUN 12/05/2021 20  6 - 23 mg/dL Final  ? Creatinine, Ser 12/05/2021 0.80  0.40 - 1.20 mg/dL Final  ? Total Bilirubin 12/05/2021 0.6  0.2 - 1.2 mg/dL Final  ? Alkaline Phosphatase 12/05/2021 69  39 - 117 U/L Final  ? AST 12/05/2021 28  0 - 37 U/L Final  ? ALT 12/05/2021 16  0 - 35 U/L Final  ? Total Protein 12/05/2021 6.7  6.0 - 8.3 g/dL Final  ? Albumin 12/05/2021 4.2  3.5 - 5.2 g/dL Final  ? GFR 12/05/2021 67.63  >60.00 mL/min Final  ? Calculated using the CKD-EPI Creatinine Equation (2021)  ? Calcium 12/05/2021 9.3  8.4 - 10.5 mg/dL Final  ? ? ?Allergies as of 02/09/2022   ? ?   Reactions  ? Macrodantin [nitrofurantoin Macrocrystal] Swelling  ? Prednisone   ? Make real "shaky-like, nervious"  ? Penicillins Rash  ? Has patient had a PCN reaction causing immediate rash, facial/tongue/throat swelling, SOB or lightheadedness with hypotension:Yes ?Has patient had a PCN reaction causing severe rash involving mucus membranes or skin necrosis: No ?Has patient had a PCN reaction that required hospitalization: No ?Has patient had a PCN reaction occurring within the last 10 years:No ?If all of the above answers are "NO", then may proceed with Cephalosporin use.  ? ?  ? ?  ?Medication List  ?  ? ?  ? Accurate as of Feb 09, 2022 11:59 PM. If you have any questions, ask your nurse or doctor.  ?  ?  ? ?  ? ?Alcohol Pads 70 % Pads ?USE 4 PER DAY ?  ?alendronate 70 MG tablet ?Commonly known as: FOSAMAX ?Take 1 tablet (70 mg total) by mouth every 7 (seven) days. Take with a full glass of water on  an empty stomach. ?  ?Alphagan P 0.1 % Soln ?Generic drug: brimonidine ?Place 1 drop into the left eye 2 (two) times daily. ?  ?aspirin 81 MG tablet ?Take 1 tablet (81 mg total) by mouth daily. ?  ?BD Pen Ne

## 2022-02-10 LAB — T4, FREE: Free T4: 1.08 ng/dL (ref 0.60–1.60)

## 2022-02-10 LAB — TSH: TSH: 1.83 u[IU]/mL (ref 0.35–5.50)

## 2022-02-11 NOTE — Progress Notes (Signed)
Please call to let patient know that the lab results are normal and no further action needed

## 2022-02-23 ENCOUNTER — Encounter: Payer: Self-pay | Admitting: Endocrinology

## 2022-03-04 ENCOUNTER — Telehealth: Payer: Self-pay

## 2022-03-04 ENCOUNTER — Other Ambulatory Visit: Payer: Self-pay

## 2022-03-04 DIAGNOSIS — E1165 Type 2 diabetes mellitus with hyperglycemia: Secondary | ICD-10-CM

## 2022-03-04 MED ORDER — ONETOUCH ULTRA VI STRP: ORAL_STRIP | 2 refills | Status: DC

## 2022-03-04 NOTE — Telephone Encounter (Signed)
Patient rechecked blood sugar on her Jennifer Bailey it 217 and fingerstick was 295.

## 2022-03-04 NOTE — Telephone Encounter (Signed)
Patient called in states that her blood sugar has been running over 300 since last night. Just did fingerstick and blood sugar 427. She is sweating, shaking, and was weak to the point she couldn't walk. She gave herself 3-4 of Novolog last night and 4 units this morning. Please advise.

## 2022-04-11 ENCOUNTER — Other Ambulatory Visit: Payer: Self-pay | Admitting: Physician Assistant

## 2022-04-11 DIAGNOSIS — R600 Localized edema: Secondary | ICD-10-CM

## 2022-04-22 ENCOUNTER — Other Ambulatory Visit: Payer: Self-pay | Admitting: Endocrinology

## 2022-04-30 ENCOUNTER — Ambulatory Visit (INDEPENDENT_AMBULATORY_CARE_PROVIDER_SITE_OTHER): Payer: HMO | Admitting: Podiatry

## 2022-04-30 DIAGNOSIS — M79674 Pain in right toe(s): Secondary | ICD-10-CM

## 2022-04-30 DIAGNOSIS — M79675 Pain in left toe(s): Secondary | ICD-10-CM

## 2022-04-30 DIAGNOSIS — B351 Tinea unguium: Secondary | ICD-10-CM | POA: Diagnosis not present

## 2022-04-30 NOTE — Progress Notes (Signed)
  Subjective:  Patient ID: Jennifer Bailey, female    DOB: [birth year],  MRN: [id number]  Chief Complaint  Patient presents with   Foot Problem    Thick toenails-Diabetic foot exam-nail trim    87 y.o. female presents with the above complaint. History confirmed with patient.  She says her blood sugar is well controlled her [name] is around 6.9%.  The nails are thickened elongated and causing pain she is unable to reach them or cut them  Objective:  Physical Exam: warm, good capillary refill, no trophic changes or ulcerative lesions, normal DP and PT pulses, normal monofilament exam, and normal sensory exam. Left Foot: dystrophic yellowed discolored nail plates with subungual debris Right Foot: dystrophic yellowed discolored nail plates with subungual debris Assessment:   1. Pain due to onychomycosis of toenails of both feet      Plan:  Patient was evaluated and treated and all questions answered.  Patient educated on diabetes. Discussed proper diabetic foot care and discussed risks and complications of disease. Educated patient in depth on reasons to return to the office immediately should he/she discover anything concerning or new on the feet. All questions answered. Discussed proper shoes as well.   Discussed the etiology and treatment options for the condition in detail with the patient. Educated patient on the topical and oral treatment options for mycotic nails. Recommended debridement of the nails today. Sharp and mechanical debridement performed of all painful and mycotic nails today. Nails debrided in length and thickness using a nail nipper to level of comfort. Discussed treatment options including appropriate shoe gear. Follow up as needed for painful nails.    No follow-ups on file.

## 2022-05-02 ENCOUNTER — Other Ambulatory Visit: Payer: Self-pay | Admitting: Endocrinology

## 2022-05-09 ENCOUNTER — Other Ambulatory Visit: Payer: Self-pay | Admitting: Endocrinology

## 2022-05-18 ENCOUNTER — Ambulatory Visit (INDEPENDENT_AMBULATORY_CARE_PROVIDER_SITE_OTHER): Payer: HMO | Admitting: Endocrinology

## 2022-05-18 ENCOUNTER — Encounter: Payer: Self-pay | Admitting: Endocrinology

## 2022-05-18 VITALS — BP 110/64 | HR 74 | Ht <= 58 in | Wt 129.8 lb

## 2022-05-18 DIAGNOSIS — E063 Autoimmune thyroiditis: Secondary | ICD-10-CM

## 2022-05-18 DIAGNOSIS — E1165 Type 2 diabetes mellitus with hyperglycemia: Secondary | ICD-10-CM | POA: Diagnosis not present

## 2022-05-18 DIAGNOSIS — Z794 Long term (current) use of insulin: Secondary | ICD-10-CM

## 2022-05-18 DIAGNOSIS — E7800 Pure hypercholesterolemia, unspecified: Secondary | ICD-10-CM

## 2022-05-18 LAB — TSH: TSH: 0.86 u[IU]/mL (ref 0.35–5.50)

## 2022-05-18 LAB — URINALYSIS, ROUTINE W REFLEX MICROSCOPIC
Bilirubin Urine: NEGATIVE
Hgb urine dipstick: NEGATIVE
Ketones, ur: NEGATIVE
Nitrite: NEGATIVE
RBC / HPF: NONE SEEN (ref 0–?)
Specific Gravity, Urine: 1.01 (ref 1.000–1.030)
Total Protein, Urine: NEGATIVE
Urine Glucose: NEGATIVE
Urobilinogen, UA: 0.2 (ref 0.0–1.0)
pH: 6.5 (ref 5.0–8.0)

## 2022-05-18 LAB — MICROALBUMIN / CREATININE URINE RATIO
Creatinine,U: 41.1 mg/dL
Microalb Creat Ratio: 4.4 mg/g (ref 0.0–30.0)
Microalb, Ur: 1.8 mg/dL (ref 0.0–1.9)

## 2022-05-18 LAB — LIPID PANEL
Cholesterol: 121 mg/dL (ref 0–200)
HDL: 45.9 mg/dL (ref >39.00)
LDL Cholesterol: 38 mg/dL (ref 0–99)
NonHDL: 75.01
Total CHOL/HDL Ratio: 3
Triglycerides: 187 mg/dL — ABNORMAL HIGH (ref 0.0–149.0)
VLDL: 37.4 mg/dL (ref 0.0–40.0)

## 2022-05-18 LAB — COMPREHENSIVE METABOLIC PANEL
ALT: 11 U/L (ref 0–35)
AST: 20 U/L (ref 0–37)
Albumin: 3.9 g/dL (ref 3.5–5.2)
Alkaline Phosphatase: 55 U/L (ref 39–117)
BUN: 18 mg/dL (ref 6–23)
CO2: 25 mEq/L (ref 19–32)
Calcium: 9 mg/dL (ref 8.4–10.5)
Chloride: 103 mEq/L (ref 96–112)
Creatinine, Ser: 0.85 mg/dL (ref 0.40–1.20)
GFR: 62.69 mL/min (ref >60.00)
Glucose, Bld: 150 mg/dL — ABNORMAL HIGH (ref 70–99)
Potassium: 4 mEq/L (ref 3.5–5.1)
Sodium: 137 mEq/L (ref 135–145)
Total Bilirubin: 0.4 mg/dL (ref 0.2–1.2)
Total Protein: 6.6 g/dL (ref 6.0–8.3)

## 2022-05-18 LAB — POCT GLYCOSYLATED HEMOGLOBIN (HGB A1C): Hemoglobin A1C: 7.4 % — AB (ref 4.0–5.6)

## 2022-05-18 NOTE — Progress Notes (Signed)
Patient ID: Jennifer Bailey, female   DOB: Feb 28, 1935, 87 y.o.   MRN: [id number]    Chief complaint: follow up    History of Present Illness     Type 2 DIABETES MELITUS, date of diagnosis: 2003  She has been on insulin for several years but continues to have labile blood sugars despite using basal bolus regimen  She is very compliant with her insulin at mealtimes  Is taking small amounts of insulin and is not on any oral hypoglycemics She was changed from Levemir to [place] insulin in 1/17 because of relatively inconsistent control   RECENT history:   Insulin regimen: Tresiba 11 units in a.m.;   mealtime NovoLog coverage: At breakfast: 3-4 units , 3-5 before lunch and 3-5 at supper                                  Current blood sugar patterns from freestyle libre, diabetes management and problems identified:  Daily management, problems identified and abnormal patterns are discussed in detail in the CGM analysis   Her [name] is somewhat higher at 7.4 compared to 6.7 Blood sugars overall improved but [name] is higher Her Tyler Aas was increased by 1 unit on the last visit Previously was getting persistently high readings throughout the day and only 48% in the target range  She still has high readings after meals especially breakfast but not consistently  She is usually taking her NovoLog right before or right after eating but her blood sugars appear to be going up very quickly despite 87 year old with ham or yogurt for breakfast  She also will take 1 or 2 units extra when the blood sugars are high after meals With this she may have low normal readings late morning and occasionally late afternoon  She has continued the same doses at lunch and dinner She is doing home video exercises usually in the morning which is a good workout Her weight is about the same  Interpretation of her CGM from the freestyle libre 2 for the last 2 weeks  The time on her monitor is said 1 hour  behind Blood sugars are much improved compared to our last visit with time in range 75 compared to 48% higher compared to her last visit with time in range HIGHEST blood sugars are mostly after breakfast although periodically after other meals also Moderate variability is present especially after meals  OVERNIGHT blood sugars are very well controlled with blood sugars in the low 100s between 10 PM-4 AM without hypoglycemia except once slightly low, blood sugars appear to be starting to rise after about 4 AM  HYPOGLYCEMIA has occurred occasionally and transiently early morning, midday or after 9 PM but usually infrequent  Blood sugars may be low normal early morning sometimes preceded by a high reading   POSTPRANDIAL readings are rising to a variable extent with most to increase after breakfast but usually spiking quickly and coming down Blood sugars after lunch and dinner are less consistently higher However Premeal blood sugars on an average seem to be higher before dinner averaging 171  CGM use % of time 98  2-week average/GV 149/38  Time in range    75    %  % Time Above 180 18  % Time above 250 6  % Time Below 70 1     PRE-MEAL Fasting Lunch Dinner Bedtime Overall  Glucose range:  Averages: 101   117    POST-MEAL PC Breakfast PC Lunch PC Dinner  Glucose range:     Averages: 189 180 173   Previous leg   CGM use % of time 98  2-week average/GV 188/35  Time in range      48%  % Time Above 180 37+ 15  % Time above 250   % Time Below 70 0     PRE-MEAL Fasting Lunch Dinner Bedtime Overall  Glucose range:       Averages: 181 201 213 156    POST-MEAL PC Breakfast PC Lunch PC Dinner  Glucose range:     Averages: 189 242 195     Symptoms with hypoglycemia: feels weak and shaky Low sugars are treated with Soft drink or glucose tablets      Meals: 3 meals per day.  Usually has small portions. Eggs/bread or one pancake with protein at 6:30 AM-7 am; has mid morning  snack.  Has hs snack if blood sugar not high Evening meal usually balanced at 5 pm with 1-2 carbohydrates  Physical activity: exercise: Some walking and yardwork         Dietician visit: Most recent: 2004            Wt Readings from Last 3 Encounters:  05/18/22 129 lb 12.8 oz (58.9 kg)  02/09/22 129 lb (58.5 kg)  01/27/22 129 lb (93.7 kg)   Complications: are: Mild neuropathy controlled symptomatically  Lab Results  Component Value Date   HGBA1C 7.4 (A) 05/18/2022   HGBA1C 6.7 (A) 12/05/2021   HGBA1C 7.6 (H) 08/25/2021   Lab Results  Component Value Date   MICROALBUR 1.8 05/18/2022   LDLCALC 38 05/18/2022   CREATININE 0.85 05/18/2022    HYPERTENSION:  this has been mild and treated with 50 mg losartan.   Periodically checks at home also  BP Readings from Last 3 Encounters:  05/18/22 110/64  02/09/22 (!) 136/56  01/27/22 129/62    OSTEOPENIA:  She has had osteopenia with T score -2.3 at the hip in 3/09 and was started on Fosamax at that time. This was stopped in 02/2014.   Fosamax was restarted in 08/2021 since bone density was slightly lower  Last bone density in 11/22 compared to 2016: DualFemur Neck Right 10/03/2015    T score -2.1 compared to   -1.9 T score is -2.4 at the radius  She had a traumatic fracture of her tibial plateau on 12/02/2020  History of vitamin D Deficiency, she is taking 50,000 units monthly with adequate levels  Lab Results  Component Value Date   VD25OH 60.18 12/05/2021   VD25OH 53.02 08/25/2021    OTHER active problems: See review of systems   LABS:    Office Visit on 05/18/2022  Component Date Value Ref Range Status   Hemoglobin [name] 05/18/2022 7.4 (A)  4.0 - 5.6 % Final   Cholesterol 05/18/2022 121  0 - 200 mg/dL Final   ATP III Classification       Desirable:  < 200 mg/dL               Borderline High:  200 - 239 mg/dL          High:  > = 240 mg/dL   Triglycerides 05/18/2022 187.0 (H)  0.0 - 149.0 mg/dL Final   Normal:   <150 mg/dLBorderline High:  150 - 199 mg/dL   HDL 05/18/2022 45.90  >39.00 mg/dL Final   VLDL 05/18/2022 37.4  0.0 - 40.0 mg/dL Final   LDL Cholesterol 05/18/2022 38  0 - 99 mg/dL Final   Total CHOL/HDL Ratio 05/18/2022 3   Final                  Men          Women1/2 Average Risk     3.4          3.3Average Risk          5.0          4.42X Average Risk          9.6          7.13X Average Risk          15.0          11.0                       NonHDL 05/18/2022 75.01   Final   NOTE:  Non-HDL goal should be 30 mg/dL higher than patient's LDL goal (i.e. LDL goal of < 70 mg/dL, would have non-HDL goal of < 100 mg/dL)   TSH 05/18/2022 0.86  0.35 - 5.50 uIU/mL Final   Sodium 05/18/2022 137  135 - 145 mEq/L Final   Potassium 05/18/2022 4.0  3.5 - 5.1 mEq/L Final   Chloride 05/18/2022 103  96 - 112 mEq/L Final   CO2 05/18/2022 25  19 - 32 mEq/L Final   Glucose, Bld 05/18/2022 150 (H)  70 - 99 mg/dL Final   BUN 05/18/2022 18  6 - 23 mg/dL Final   Creatinine, Ser 05/18/2022 0.85  0.40 - 1.20 mg/dL Final   Total Bilirubin 05/18/2022 0.4  0.2 - 1.2 mg/dL Final   Alkaline Phosphatase 05/18/2022 55  39 - 117 U/L Final   AST 05/18/2022 20  0 - 37 U/L Final   ALT 05/18/2022 11  0 - 35 U/L Final   Total Protein 05/18/2022 6.6  6.0 - 8.3 g/dL Final   Albumin 05/18/2022 3.9  3.5 - 5.2 g/dL Final   GFR 05/18/2022 62.69  >60.00 mL/min Final   Calculated using the CKD-EPI Creatinine Equation (2021)   Calcium 05/18/2022 9.0  8.4 - 10.5 mg/dL Final   Color, Urine 05/18/2022 YELLOW  Yellow;Lt. Yellow;Straw;Dark Yellow;Amber;Green;Red;Brown Final   APPearance 05/18/2022 CLEAR  Clear;Turbid;Slightly Cloudy;Cloudy Final   Specific Gravity, Urine 05/18/2022 1.010  1.000 - 1.030 Final   pH 05/18/2022 6.5  5.0 - 8.0 Final   Total Protein, Urine 05/18/2022 NEGATIVE  Negative Final   Urine Glucose 05/18/2022 NEGATIVE  Negative Final   Ketones, ur 05/18/2022 NEGATIVE  Negative Final   Bilirubin Urine 05/18/2022  NEGATIVE  Negative Final   Hgb urine dipstick 05/18/2022 NEGATIVE  Negative Final   Urobilinogen, UA 05/18/2022 0.2  0.0 - 1.0 Final   Leukocytes,Ua 05/18/2022 TRACE (A)  Negative Final   Nitrite 05/18/2022 NEGATIVE  Negative Final   WBC, UA 05/18/2022 0-2/hpf  0-2/hpf Final   RBC / HPF 05/18/2022 none seen  0-2/hpf Final   Squamous Epithelial / LPF 05/18/2022 Rare(0-4/hpf)  Rare(0-4/hpf) Final   Microalb, Ur 05/18/2022 1.8  0.0 - 1.9 mg/dL Final   Creatinine,U 05/18/2022 41.1  mg/dL Final   Microalb Creat Ratio 05/18/2022 4.4  0.0 - 30.0 mg/g Final    Allergies as of 05/18/2022       Reactions   Macrodantin [nitrofurantoin Macrocrystal] Swelling   Prednisone    Make real "shaky-like, nervious"   Penicillins Rash  Has patient had a PCN reaction causing immediate rash, facial/tongue/throat swelling, SOB or lightheadedness with hypotension:Yes Has patient had a PCN reaction causing severe rash involving mucus membranes or skin necrosis: No Has patient had a PCN reaction that required hospitalization: No Has patient had a PCN reaction occurring within the last 10 years:No If all of the above answers are "NO", then may proceed with Cephalosporin use.        Medication List        Accurate as of May 18, 2022  4:38 PM. If you have any questions, ask your nurse or doctor.          Alcohol Pads 70 % Pads USE 4 PER DAY   alendronate 70 MG tablet Commonly known as: FOSAMAX Take 1 tablet (70 mg total) by mouth every 7 (seven) days. Take with a full glass of water on an empty stomach.   Alphagan P 0.1 % Soln Generic drug: brimonidine Place 1 drop into the left eye 2 (two) times daily.   aspirin 81 MG tablet Take 1 tablet (81 mg total) by mouth daily.   BD Pen Needle Nano U/F 32G X 4 MM Misc Generic drug: Insulin Pen Needle USE AS DIRECTED FIVE TIMES A DAY.   cycloSPORINE 0.05 % ophthalmic emulsion Commonly known as: RESTASIS 1 drop 2 (two) times daily.    dorzolamidel-timolol 22.3-6.8 MG/ML Soln ophthalmic solution Commonly known as: COSOPT Place 1 drop into the left eye 2 (two) times daily.   FreeStyle Libre 14 Day Reader Kerrin Mo USE DAILY TO CHECK BLOOD SUGAR   FreeStyle Libre 2 Sensor Misc APPLY TO ARM EVERY 14 DAYS TO MONITOR BLOOD SUGAR.   furosemide 20 MG tablet Commonly known as: LASIX Take 1 tablet by mouth every other day.   gabapentin 100 MG capsule Commonly known as: NEURONTIN TAKE 3 TO 4 CAPSULES BY MOUTH AT BEDTIME.   levothyroxine 75 MCG tablet Commonly known as: SYNTHROID tablet   levothyroxine 88 MCG tablet Commonly known as: SYNTHROID TAKE 1 TABLET (88 MCG TOTAL) BY MOUTH DAILY.   losartan 50 MG tablet Commonly known as: COZAAR TAKE 1 TABLET BY MOUTH DAILY.   lovastatin 40 MG tablet Commonly known as: MEVACOR TAKE 1 TABLET BY MOUTH ONCE DAILY WITH A MEAL.   NovoLOG FlexPen 100 UNIT/ML FlexPen Generic drug: insulin aspart INJECT 3 TO 5 UNITS SUBCUTANEOUSLY BEFORE MEALS AS DIRECTED. What changed: See the new instructions.   Omega-3 1000 MG Caps Take 1 capsule by mouth 4 (four) times daily.   OneTouch Verio test strip Generic drug: glucose blood USE TO TEST BLOOD SUGAR 4 TIMES DAILY   OneTouch Ultra test strip Generic drug: glucose blood Use to check blood sugar once a day if needed   pantoprazole 40 MG tablet Commonly known as: PROTONIX Take 40 mg by mouth 2 (two) times daily as needed (indigestion).   potassium chloride SA 20 MEQ tablet Commonly known as: KLOR-CON M TAKE 1 TABLET BY MOUTH DAILY AS NEEDED. TAKE WITH WATER PILL- LASIX.   PreserVision AREDS 2 Caps Take 1 capsule by mouth 2 (two) times daily.   senna-docusate 8.6-50 MG tablet Commonly known as: Senokot-S Take 1 tablet by mouth 2 (two) times daily. While on narcotics   Tresiba FlexTouch 100 UNIT/ML FlexTouch Pen Generic drug: insulin degludec INJECT 0.11 MLS (11 UNITS TOTAL) INTO THE SKIN DAILY. What changed: See the new  instructions.   vitamin C 100 MG tablet Take 500 mg by mouth daily.   Vitamin D (  Ergocalciferol) 1.25 MG (50000 UNIT) Caps capsule Commonly known as: DRISDOL TAKE 1 CAPSULE BY MOUTH ONCE A MONTH.        Allergies:  Allergies  Allergen Reactions   Macrodantin [Nitrofurantoin Macrocrystal] Swelling   Prednisone     Make real "shaky-like, nervious"   Penicillins Rash    Has patient had a PCN reaction causing immediate rash, facial/tongue/throat swelling, SOB or lightheadedness with hypotension:Yes Has patient had a PCN reaction causing severe rash involving mucus membranes or skin necrosis: No Has patient had a PCN reaction that required hospitalization: No Has patient had a PCN reaction occurring within the last 10 years:No If all of the above answers are "NO", then may proceed with Cephalosporin use.     Past Medical History:  Diagnosis Date   Diabetes mellitus without complication ([place])    Thyroid disease     Past Surgical History:  Procedure Laterality Date   ABDOMINAL HYSTERECTOMY     CHOLECYSTECTOMY     COLON SURGERY     ORIF TIBIA PLATEAU Right 12/04/2020   Procedure: OPEN REDUCTION INTERNAL FIXATION (ORIF) TIBIAL PLATEAU;  Surgeon: Shona Needles, MD;  Location: [facility];  Service: Orthopedics;  Laterality: Right;    Family History  Problem Relation Age of Onset   Cancer Mother    Diabetes Brother     Social History:  reports that she has never smoked. She has never used smokeless tobacco. She reports that she does not drink alcohol and does not use drugs.   Review of Systems   Peripheral NEUROPATHY with sensory symptoms: Treated with 300-400 mg gabapentin taking at bedtime with adequate relief  HYPOTHYROIDISM: She has had primary hypothyroidism, long-standing .  No symptoms of fatigue She is  feeling sleepy at times and if she is sitting down she will fall asleep easily No change in her chronic cold intolerance  Her TSH has been consistently normal,  taking 88 micrograms levothyroxine daily  Lab Results  Component Value Date   TSH 0.86 05/18/2022   TSH 1.83 02/09/2022   TSH 4.26 12/05/2021   FREET4 1.08 02/09/2022   FREET4 0.90 12/05/2021   FREET4 1.01 08/25/2021       HYPERLIPIDEMIA: The lipid abnormality consists of elevated LDL,  taking lovastatin with consistently good control, last level below 70.  Lab Results  Component Value Date   CHOL 121 05/18/2022   HDL 45.90 05/18/2022   LDLCALC 38 05/18/2022   TRIG 187.0 (H) 05/18/2022   CHOLHDL 3 05/18/2022    Examination:   BP 110/64   Pulse 74   Ht '4\' 9"'$  (1.448 m)   Wt 129 lb 12.8 oz (58.9 kg)   SpO2 98%   BMI 28.09 kg/m   Body mass index is 28.09 kg/m.       ASSESSMENT/ PLAN:    Diabetes type 2 on insulin   See history of present illness for detailed discussion of current management, blood sugar patterns and problems identified  Her [name] is 7.4  She is on basal bolus insulin with relatively low insulin doses  Her blood sugars are as before difficult to regulate consistently She has mostly high readings after meals but still is getting 75% of her CGM glucose readings within target range  She likely needs her NovoLog to be taken at a time before eating breakfast to help with the hyperglycemia and not clear if she needs a higher dose She is still has moderate variability with GV 38  Discussed her day-to-day management,  diet and insulin adjustment  Recommendations:  No change in [place] Make sure she takes her insulin before starting to eat and at least 5 or 10 minutes before breakfast Avoid any correction boluses after breakfast  Hypertension:  Her blood pressure is very well controlled on losartan Check urine microalbumin today  Neuropathy: Symptoms are controlled  Hypothyroidism : To be rechecked  Daytime somnolence: Not clear if this is related to some insomnia issues and she will follow-up with her PCP    Total visit time including  counseling = 30 minutes  Patient Instructions  Take am Novolog 5-10 min before breakfast   Elayne Snare 05/18/2022, 4:38 PM   Addendum Labs fairly good with mild increase in triglycerides with nonfasting labs and no abnormalities

## 2022-05-18 NOTE — Patient Instructions (Signed)
Take am Novolog 5-10 min before breakfast

## 2022-05-21 ENCOUNTER — Encounter: Payer: Self-pay | Admitting: Endocrinology

## 2022-07-06 DIAGNOSIS — Z794 Long term (current) use of insulin: Secondary | ICD-10-CM | POA: Diagnosis not present

## 2022-07-06 DIAGNOSIS — H40011 Open angle with borderline findings, low risk, right eye: Secondary | ICD-10-CM | POA: Diagnosis not present

## 2022-07-06 DIAGNOSIS — E119 Type 2 diabetes mellitus without complications: Secondary | ICD-10-CM | POA: Diagnosis not present

## 2022-07-06 DIAGNOSIS — H353131 Nonexudative age-related macular degeneration, bilateral, early dry stage: Secondary | ICD-10-CM | POA: Diagnosis not present

## 2022-07-06 DIAGNOSIS — Z961 Presence of intraocular lens: Secondary | ICD-10-CM | POA: Diagnosis not present

## 2022-07-06 DIAGNOSIS — H04123 Dry eye syndrome of bilateral lacrimal glands: Secondary | ICD-10-CM | POA: Diagnosis not present

## 2022-07-06 DIAGNOSIS — H401221 Low-tension glaucoma, left eye, mild stage: Secondary | ICD-10-CM | POA: Diagnosis not present

## 2022-07-07 ENCOUNTER — Encounter: Payer: Self-pay | Admitting: *Deleted

## 2022-07-07 NOTE — Progress Notes (Signed)
[facility] Quality Team Note  Name: Jennifer Bailey Date of Birth: 02/10/1935 MRN: [id number] Date: 07/07/2022  [facility] Quality Team has reviewed this patient's chart, please see recommendations below:  [facility] Quality Other; (Pt has open gap for AWV.  She is due in Dec according to last AWV.  )

## 2022-07-23 ENCOUNTER — Ambulatory Visit (INDEPENDENT_AMBULATORY_CARE_PROVIDER_SITE_OTHER): Payer: HMO | Admitting: Podiatry

## 2022-07-23 ENCOUNTER — Other Ambulatory Visit: Payer: Self-pay | Admitting: Endocrinology

## 2022-07-23 DIAGNOSIS — M79674 Pain in right toe(s): Secondary | ICD-10-CM | POA: Diagnosis not present

## 2022-07-23 DIAGNOSIS — B351 Tinea unguium: Secondary | ICD-10-CM

## 2022-07-23 DIAGNOSIS — L853 Xerosis cutis: Secondary | ICD-10-CM | POA: Diagnosis not present

## 2022-07-23 DIAGNOSIS — M79675 Pain in left toe(s): Secondary | ICD-10-CM

## 2022-07-23 MED ORDER — AMMONIUM LACTATE 12 % EX CREA: 1.0000 | TOPICAL_CREAM | CUTANEOUS | 0 refills | Status: DC | PRN

## 2022-07-24 NOTE — Progress Notes (Signed)
  Subjective:  Patient ID: Jennifer Bailey, female    DOB: 09/20/1935,  MRN: [id number]  Chief Complaint  Patient presents with   Nail Problem    [facility]- Nail trim.  Blood sugar 86 this morning.    87 y.o. female presents with the above complaint. History confirmed with patient.  Says her blood sugar remains well controlled.  The nails are thickened elongated and causing pain she is unable to reach them or cut them.  Debridement helpful in reducing pain and improve pain function.  She wants to do very dry skin on her feet  Objective:  Physical Exam: warm, good capillary refill, no trophic changes or ulcerative lesions, normal DP and PT pulses, normal monofilament exam, and normal sensory exam.  Diffuse xerosis cutis on both feet Left Foot: dystrophic yellowed discolored nail plates with subungual debris Right Foot: dystrophic yellowed discolored nail plates with subungual debris Assessment:   1. Pain due to onychomycosis of toenails of both feet   2. Xerosis cutis      Plan:  Patient was evaluated and treated and all questions answered.  Patient educated on diabetes. Discussed proper diabetic foot care and discussed risks and complications of disease. Educated patient in depth on reasons to return to the office immediately should he/she discover anything concerning or new on the feet. All questions answered. Discussed proper shoes as well.   Discussed etiology and treatment options for xerosis cutis as well as general foot hygiene.  I recommended ammonium lactate cream.  Rx was sent to her pharmacy for this.  Discussed the etiology and treatment options for the condition in detail with the patient. Educated patient on the topical and oral treatment options for mycotic nails. Recommended debridement of the nails today. Sharp and mechanical debridement performed of all painful and mycotic nails today. Nails debrided in length and thickness using a nail nipper to level of comfort. Discussed  treatment options including appropriate shoe gear. Follow up as needed for painful nails.    Return in about 3 months (around 10/23/2022) for at risk diabetic foot care.

## 2022-08-11 ENCOUNTER — Other Ambulatory Visit: Payer: Self-pay | Admitting: Endocrinology

## 2022-08-15 ENCOUNTER — Other Ambulatory Visit: Payer: Self-pay | Admitting: Endocrinology

## 2022-09-07 ENCOUNTER — Ambulatory Visit: Payer: HMO | Admitting: Endocrinology

## 2022-09-21 ENCOUNTER — Other Ambulatory Visit: Payer: Self-pay | Admitting: Endocrinology

## 2022-10-06 ENCOUNTER — Ambulatory Visit (INDEPENDENT_AMBULATORY_CARE_PROVIDER_SITE_OTHER): Payer: PPO | Admitting: Endocrinology

## 2022-10-06 ENCOUNTER — Encounter: Payer: Self-pay | Admitting: Endocrinology

## 2022-10-06 VITALS — BP 136/68 | HR 82 | Ht <= 58 in | Wt 128.4 lb

## 2022-10-06 DIAGNOSIS — Z794 Long term (current) use of insulin: Secondary | ICD-10-CM | POA: Diagnosis not present

## 2022-10-06 DIAGNOSIS — E063 Autoimmune thyroiditis: Secondary | ICD-10-CM

## 2022-10-06 DIAGNOSIS — E1165 Type 2 diabetes mellitus with hyperglycemia: Secondary | ICD-10-CM

## 2022-10-06 DIAGNOSIS — D649 Anemia, unspecified: Secondary | ICD-10-CM

## 2022-10-06 DIAGNOSIS — I10 Essential (primary) hypertension: Secondary | ICD-10-CM | POA: Diagnosis not present

## 2022-10-06 LAB — BASIC METABOLIC PANEL
BUN: 16 mg/dL (ref 6–23)
CO2: 24 mEq/L (ref 19–32)
Calcium: 9.1 mg/dL (ref 8.4–10.5)
Chloride: 102 mEq/L (ref 96–112)
Creatinine, Ser: 0.76 mg/dL (ref 0.40–1.20)
GFR: 71.51 mL/min (ref >60.00)
Glucose, Bld: 326 mg/dL — ABNORMAL HIGH (ref 70–99)
Potassium: 4.6 mEq/L (ref 3.5–5.1)
Sodium: 136 mEq/L (ref 135–145)

## 2022-10-06 LAB — CBC
HCT: 32.3 % — ABNORMAL LOW (ref 36.0–46.0)
Hemoglobin: 11 g/dL — ABNORMAL LOW (ref 12.0–15.0)
MCHC: 33.9 g/dL (ref 30.0–36.0)
MCV: 89.9 fl (ref 78.0–100.0)
Platelets: 284 10*3/uL (ref 150.0–400.0)
RBC: 3.59 Mil/uL — ABNORMAL LOW (ref 3.87–5.11)
RDW: 13.6 % (ref 11.5–15.5)
WBC: 7.8 10*3/uL (ref 4.0–10.5)

## 2022-10-06 LAB — POCT GLYCOSYLATED HEMOGLOBIN (HGB A1C): Hemoglobin A1C: 7.1 % — AB (ref 4.0–5.6)

## 2022-10-06 LAB — T4, FREE: Free T4: 1.33 ng/dL (ref 0.60–1.60)

## 2022-10-06 LAB — TSH: TSH: 1.62 u[IU]/mL (ref 0.35–5.50)

## 2022-10-06 NOTE — Progress Notes (Signed)
Patient ID: Jennifer Bailey, female   DOB: 24-Mar-1935, 88 y.o.   MRN: [id number]    Chief complaint: follow up    History of Present Illness     Type 2 DIABETES MELITUS, date of diagnosis: 2003  She has been on insulin for several years but continues to have labile blood sugars despite using basal bolus regimen  She is very compliant with her insulin at mealtimes  Is taking small amounts of insulin and is not on any oral hypoglycemics She was changed from Levemir to [place] insulin in 1/17 because of relatively inconsistent control   RECENT history:   Insulin regimen: Tresiba 11 units in a.m.;  mealtime NovoLog coverage: At breakfast: 3-4 units , 3-5 before lunch and 3-5 at supper                                  Current blood sugar patterns from freestyle libre, diabetes management and problems identified:  Daily management, problems identified and abnormal patterns are discussed in detail in the CGM analysis   Her [name] is 7.1  She appears to have highly variable blood sugars especially postprandially This may be related to variable diet Usually in the morning she is using some kind of meat biscuit  However blood sugars are not as significantly higher after breakfast recently  Last night blood sugar went up to over 350 but may be because she forgot her mealtime insulin  Basal insulin was not changed on the last visit Overall hypoglycemia has been minimal She is doing home video exercises in the morning usually Her weight is about the same  CGM use % of time 86  2-week average/GV 167/44  Time in range        61%, was 75  % Time Above 180 21  % Time above 250 16  % Time Below 70 2     PRE-MEAL Fasting Lunch Dinner Bedtime Overall  Glucose range:       Averages: 141   164    POST-MEAL PC Breakfast PC Lunch PC Dinner  Glucose range:     Averages: 195 176 209    Interpretation of her CGM from the freestyle libre 2 for the last 2 weeks Blood sugars are  much more variable compared to previous visit Also time in range is less at 61 compared to 75 Overnight blood sugars are somewhat variable although generally within the target range and once below 70 HYPERGLYCEMIA has been seen at all different times Highest blood sugars are generally after breakfast and dinner with averages nearly 200 after breakfast and around 210 after dinner However postprandial readings in the morning are not rising as much in the last week  Hypoglycemia after lunch is also quite variable As before blood sugars tend to be overall higher before dinner also   Prior:  CGM use % of time 98  2-week average/GV 149/38  Time in range    75    %  % Time Above 180 18  % Time above 250 6  % Time Below 70 1     PRE-MEAL Fasting Lunch Dinner Bedtime Overall  Glucose range:       Averages: 101   117    POST-MEAL PC Breakfast PC Lunch PC Dinner  Glucose range:     Averages: 189 180 173    Symptoms with hypoglycemia: feels weak and shaky Low sugars are  treated with Soft drink or glucose tablets      Meals: 3 meals per day.  Usually has small portions. Eggs/bread or one pancake with protein at 6:30 AM-7 am; has mid morning snack.  Has hs snack if blood sugar not high Evening meal usually balanced at 5 pm with 1-2 carbohydrates  Physical activity: exercise: Some walking and yardwork         Dietician visit: Most recent: 2004            Wt Readings from Last 3 Encounters:  10/06/22 128 lb 6.4 oz (58.2 kg)  05/18/22 129 lb 12.8 oz (58.9 kg)  02/09/22 129 lb (94.1 kg)   Complications: are: Mild neuropathy controlled symptomatically  Lab Results  Component Value Date   HGBA1C 7.1 (A) 10/06/2022   HGBA1C 7.4 (A) 05/18/2022   HGBA1C 6.7 (A) 12/05/2021   Lab Results  Component Value Date   MICROALBUR 1.8 05/18/2022   LDLCALC 38 05/18/2022   CREATININE 0.85 05/18/2022    HYPERTENSION:  this has been mild and treated with 50 mg losartan.   Periodically checks at  home also  BP Readings from Last 3 Encounters:  10/06/22 136/68  05/18/22 110/64  02/09/22 (!) 136/56    OSTEOPENIA:  She has had osteopenia with T score -2.3 at the hip in 3/09 and was started on Fosamax at that time. This was stopped in 02/2014.   Fosamax was restarted in 08/2021 since bone density was slightly lower  Last bone density in 11/22 compared to 2016: DualFemur Neck Right 10/03/2015    T score -2.1 compared to   -1.9 T score is -2.4 at the radius  She had a traumatic fracture of her tibial plateau on 12/02/2020  History of vitamin D Deficiency, she is taking 50,000 units monthly with adequate levels  Lab Results  Component Value Date   VD25OH 60.18 12/05/2021   VD25OH 53.02 08/25/2021    OTHER active problems: See review of systems   LABS:    Office Visit on 10/06/2022  Component Date Value Ref Range Status   Hemoglobin [name] 10/06/2022 7.1 (A)  4.0 - 5.6 % Final    Allergies as of 10/06/2022       Reactions   Macrodantin [nitrofurantoin Macrocrystal] Swelling   Prednisone    Make real "shaky-like, nervious"   Penicillins Rash   Has patient had a PCN reaction causing immediate rash, facial/tongue/throat swelling, SOB or lightheadedness with hypotension:Yes Has patient had a PCN reaction causing severe rash involving mucus membranes or skin necrosis: No Has patient had a PCN reaction that required hospitalization: No Has patient had a PCN reaction occurring within the last 10 years:No If all of the above answers are "NO", then may proceed with Cephalosporin use.        Medication List        Accurate as of October 06, 2022  2:49 PM. If you have any questions, ask your nurse or doctor.          Alcohol Pads 70 % Pads USE 4 PER DAY   alendronate 70 MG tablet Commonly known as: FOSAMAX TAKE 1 TABLET BY MOUTH EVERY 7 DAYS. TAKE WITH A FULL GLASS OF WATER ON AN EMPTY STOMACH.   Alphagan P 0.1 % Soln Generic drug: brimonidine Place 1 drop into  the left eye 2 (two) times daily.   ammonium lactate 12 % cream Commonly known as: AMLACTIN Apply 1 Application topically as needed for dry skin.  aspirin 81 MG tablet Take 1 tablet (81 mg total) by mouth daily.   BD Pen Needle Nano U/F 32G X 4 MM Misc Generic drug: Insulin Pen Needle USE AS DIRECTED FIVE TIMES A DAY.   cycloSPORINE 0.05 % ophthalmic emulsion Commonly known as: RESTASIS 1 drop 2 (two) times daily.   dorzolamidel-timolol 22.3-6.8 MG/ML Soln ophthalmic solution Commonly known as: COSOPT Place 1 drop into the left eye 2 (two) times daily.   FreeStyle Libre 14 Day Reader Kerrin Mo USE DAILY TO CHECK BLOOD SUGAR   FreeStyle Libre 2 Sensor Misc APPLY TO ARM EVERY 14 DAYS TO MONITOR BLOOD SUGAR.   furosemide 20 MG tablet Commonly known as: LASIX Take 1 tablet by mouth every other day.   gabapentin 100 MG capsule Commonly known as: NEURONTIN TAKE 3 TO 4 CAPSULES BY MOUTH AT BEDTIME.   levothyroxine 75 MCG tablet Commonly known as: SYNTHROID tablet   levothyroxine 88 MCG tablet Commonly known as: SYNTHROID TAKE 1 TABLET (88 MCG TOTAL) BY MOUTH DAILY.   losartan 50 MG tablet Commonly known as: COZAAR TAKE 1 TABLET BY MOUTH DAILY.   lovastatin 40 MG tablet Commonly known as: MEVACOR TAKE 1 TABLET BY MOUTH ONCE DAILY WITH A MEAL.   NovoLOG FlexPen 100 UNIT/ML FlexPen Generic drug: insulin aspart INJECT 3 TO 5 UNITS SUBCUTANEOUSLY BEFORE MEALS AS DIRECTED.   Omega-3 1000 MG Caps Take 1 capsule by mouth 4 (four) times daily.   OneTouch Verio test strip Generic drug: glucose blood USE TO TEST BLOOD SUGAR 4 TIMES DAILY   OneTouch Ultra test strip Generic drug: glucose blood Use to check blood sugar once a day if needed   pantoprazole 40 MG tablet Commonly known as: PROTONIX Take 40 mg by mouth 2 (two) times daily as needed (indigestion).   potassium chloride SA 20 MEQ tablet Commonly known as: KLOR-CON M TAKE 1 TABLET BY MOUTH DAILY AS NEEDED. TAKE  WITH WATER PILL- LASIX.   PreserVision AREDS 2 Caps Take 1 capsule by mouth 2 (two) times daily.   senna-docusate 8.6-50 MG tablet Commonly known as: Senokot-S Take 1 tablet by mouth 2 (two) times daily. While on narcotics   Tresiba FlexTouch 100 UNIT/ML FlexTouch Pen Generic drug: insulin degludec INJECT 0.11 MLS (11 UNITS TOTAL) INTO THE SKIN DAILY. What changed: See the new instructions.   vitamin C 100 MG tablet Take 500 mg by mouth daily.   Vitamin D (Ergocalciferol) 1.25 MG (50000 UNIT) Caps capsule Commonly known as: DRISDOL TAKE 1 CAPSULE BY MOUTH ONCE A MONTH.        Allergies:  Allergies  Allergen Reactions   Macrodantin [Nitrofurantoin Macrocrystal] Swelling   Prednisone     Make real "shaky-like, nervious"   Penicillins Rash    Has patient had a PCN reaction causing immediate rash, facial/tongue/throat swelling, SOB or lightheadedness with hypotension:Yes Has patient had a PCN reaction causing severe rash involving mucus membranes or skin necrosis: No Has patient had a PCN reaction that required hospitalization: No Has patient had a PCN reaction occurring within the last 10 years:No If all of the above answers are "NO", then may proceed with Cephalosporin use.     Past Medical History:  Diagnosis Date   Diabetes mellitus without complication ([place])    Thyroid disease     Past Surgical History:  Procedure Laterality Date   ABDOMINAL HYSTERECTOMY     CHOLECYSTECTOMY     COLON SURGERY     ORIF TIBIA PLATEAU Right 12/04/2020   Procedure:  OPEN REDUCTION INTERNAL FIXATION (ORIF) TIBIAL PLATEAU;  Surgeon: Shona Needles, MD;  Location: [place];  Service: Orthopedics;  Laterality: Right;    Family History  Problem Relation Age of Onset   Cancer Mother    Diabetes Brother     Social History:  reports that she has never smoked. She has never used smokeless tobacco. She reports that she does not drink alcohol and does not use drugs.   Review of Systems    Peripheral NEUROPATHY with sensory symptoms: Treated with 300-400 mg gabapentin taking at bedtime with relief  HYPOTHYROIDISM: She has had primary hypothyroidism, long-standing  No symptoms of fatigue She is again stating that if she is sitting down she will fall asleep easily No change in her chronic cold intolerance  Her TSH has been consistently normal, taking 88 micrograms levothyroxine daily  Lab Results  Component Value Date   TSH 0.86 05/18/2022   TSH 1.83 02/09/2022   TSH 4.26 12/05/2021   FREET4 1.08 02/09/2022   FREET4 0.90 12/05/2021   FREET4 1.01 08/25/2021       HYPERLIPIDEMIA: The lipid abnormality consists of elevated LDL,  taking lovastatin with consistently good control, last level below 70.  Lab Results  Component Value Date   CHOL 121 05/18/2022   HDL 45.90 05/18/2022   LDLCALC 38 05/18/2022   TRIG 187.0 (H) 05/18/2022   CHOLHDL 3 05/18/2022    She is afraid of side effects from the RSV shot and has not taken it   Examination:   BP 136/68 (BP Location: Left Arm, Patient Position: Sitting, Cuff Size: Normal)   Pulse 82   Ht '4\' 9"'$  (1.448 m)   Wt 128 lb 6.4 oz (58.2 kg)   SpO2 99%   BMI 27.79 kg/m   Body mass index is 27.79 kg/m.       ASSESSMENT/ PLAN:    Diabetes type 2 on insulin   See history of present illness for detailed discussion of current management, blood sugar patterns and problems identified  Her [name] is 7.1  She is on basal bolus insulin with relatively low insulin doses  Her blood sugars are generally more variable and may be related to inconsistent diet Also possibly may not be remembering to take her insulin at meals especially dinnertime Difficult to adjust her insulin because of inconsistent response to the same foods  She may also not be taking her insulin ahead of time before eating consistently also  Discussed her day-to-day management, factors affecting glucose and insulin adjustment  Recommendations:  She  will try to avoid high-fat foods especially at breakfast Tresiba to be reduced to 10 units Make sure she takes her insulin before starting to eat and at least 5 or 10 minutes before breakfast Call if having unusually high or low readings  Hypertension:  Her blood pressure is very well controlled on losartan 50 mg  Hypothyroidism : Adequately controlled but because of somnolence will need to be rechecked  History of anemia: Needs follow-up  Discussed importance of having the RSV injection especially with her risk factors and reassured her that it is safe  Total visit time including counseling = 30 minutes  Patient Instructions  Check blood sugars on waking up   Also check blood sugars about 2 hours after meals and do this after different meals by rotation  Recommended blood sugar levels on waking up are 90-130 and about 2 hours after meal is 130-160   Novolog 5-10 min before eating  Tresiba  10 units  Get RSV shot   Elayne Snare 10/06/2022, 2:49 PM   Addendum: Labs stable

## 2022-10-06 NOTE — Patient Instructions (Addendum)
Check blood sugars on waking up   Also check blood sugars about 2 hours after meals and do this after different meals by rotation  Recommended blood sugar levels on waking up are 90-130 and about 2 hours after meal is 130-160   Novolog 5-10 min before eating  Tresiba 10 units  Get RSV shot

## 2022-10-15 ENCOUNTER — Telehealth: Payer: Self-pay

## 2022-10-15 NOTE — Telephone Encounter (Signed)
Daughter called states mother is having a lot of cognitive issues. She is not able to remember things. States that they read up on gabapentin and says it can cause some memory issues. Not sure if is that or dementia.

## 2022-10-20 ENCOUNTER — Encounter: Payer: Self-pay | Admitting: Endocrinology

## 2022-10-27 ENCOUNTER — Encounter: Payer: Self-pay | Admitting: Podiatry

## 2022-10-27 ENCOUNTER — Ambulatory Visit (INDEPENDENT_AMBULATORY_CARE_PROVIDER_SITE_OTHER): Payer: HMO | Admitting: Podiatry

## 2022-10-27 DIAGNOSIS — B351 Tinea unguium: Secondary | ICD-10-CM | POA: Diagnosis not present

## 2022-10-27 DIAGNOSIS — M79675 Pain in left toe(s): Secondary | ICD-10-CM | POA: Diagnosis not present

## 2022-10-27 DIAGNOSIS — M79674 Pain in right toe(s): Secondary | ICD-10-CM

## 2022-10-27 NOTE — Progress Notes (Signed)
  Subjective:  Patient ID: Jennifer Bailey, female    DOB: Oct 04, 1935,  MRN: [id number]  Chief Complaint  Patient presents with   Nail Problem    [facility]- Nail trim.  Blood sugar 86 this morning.    88 y.o. female presents with the above complaint. History confirmed with patient.  Says her blood sugar remains well controlled.  The nails are thickened elongated and causing pain she is unable to reach them or cut them.  Debridement helpful in reducing pain and improve pain function.   Objective:  Physical Exam: warm, good capillary refill, no trophic changes or ulcerative lesions, normal DP and PT pulses, normal monofilament exam, and normal sensory exam.  Diffuse xerosis cutis on both feet Left Foot: dystrophic yellowed discolored nail plates with subungual debris Right Foot: dystrophic yellowed discolored nail plates with subungual debris Assessment:   1. Pain due to onychomycosis of toenails of both feet   2. Xerosis cutis      Plan:  Patient was evaluated and treated and all questions answered.  Patient educated on diabetes. Discussed proper diabetic foot care and discussed risks and complications of disease. Educated patient in depth on reasons to return to the office immediately should he/she discover anything concerning or new on the feet. All questions answered. Discussed proper shoes as well.    Discussed the etiology and treatment options for the condition in detail with the patient. Educated patient on the topical and oral treatment options for mycotic nails. Recommended debridement of the nails today. Sharp and mechanical debridement performed of all painful and mycotic nails today. Nails debrided in length and thickness using a nail nipper to level of comfort. Discussed treatment options including appropriate shoe gear. Follow up as needed for painful nails.    Return in about 3 months (around 10/23/2022) for at risk diabetic foot care.

## 2022-11-11 ENCOUNTER — Other Ambulatory Visit: Payer: Self-pay | Admitting: Endocrinology

## 2022-11-19 ENCOUNTER — Other Ambulatory Visit: Payer: Self-pay | Admitting: Endocrinology

## 2022-11-19 DIAGNOSIS — Z1231 Encounter for screening mammogram for malignant neoplasm of breast: Secondary | ICD-10-CM

## 2022-11-23 ENCOUNTER — Ambulatory Visit (INDEPENDENT_AMBULATORY_CARE_PROVIDER_SITE_OTHER): Payer: PPO | Admitting: Nurse Practitioner

## 2022-11-23 ENCOUNTER — Encounter: Payer: Self-pay | Admitting: Nurse Practitioner

## 2022-11-23 VITALS — BP 127/69 | HR 69 | Ht <= 58 in | Wt 126.0 lb

## 2022-11-23 DIAGNOSIS — E785 Hyperlipidemia, unspecified: Secondary | ICD-10-CM

## 2022-11-23 DIAGNOSIS — F4321 Adjustment disorder with depressed mood: Secondary | ICD-10-CM | POA: Diagnosis not present

## 2022-11-23 DIAGNOSIS — I152 Hypertension secondary to endocrine disorders: Secondary | ICD-10-CM

## 2022-11-23 DIAGNOSIS — E1169 Type 2 diabetes mellitus with other specified complication: Secondary | ICD-10-CM | POA: Diagnosis not present

## 2022-11-23 DIAGNOSIS — E1165 Type 2 diabetes mellitus with hyperglycemia: Secondary | ICD-10-CM

## 2022-11-23 DIAGNOSIS — E1159 Type 2 diabetes mellitus with other circulatory complications: Secondary | ICD-10-CM | POA: Diagnosis not present

## 2022-11-23 NOTE — Progress Notes (Signed)
Established patient visit   Patient: Jennifer Bailey   DOB: 1935/02/17   88 y.o. Female  MRN: [id number] Visit Date: 11/23/2022  Chief Complaint  Patient presents with   Memory Loss   Subjective    HPI  Concerns about memory  -living by herself for the first time ever. Daughter, who lived with her, passed away, a year ago in 08/09/2023.  -forgetting dates of appointments or days of the week the appointments are on.  -the sunny days, symptoms are much better than nights and rainy days.  -daughter here with the patient.  -states that she worries about her mom. This is the first time she has ever lived alone.  -gets very upset that her sister (patient's daughter) passed away due to cancer last 2023-08-09.  -the patient does walk with a shuffling gait which started after she broke her leg a few years ago. Patient feels as though her leg never quite healed back to normal.  -she denies physical concerns or complaints.  -She denies chest pain, chest pressure, or shortness of breath. She denies headaches or visual disturbances. She denies abdominal pain, nausea, vomiting, or changes in bowel or bladder habits.      Medications: Outpatient Medications Prior to Visit  Medication Sig   Alcohol Swabs (ALCOHOL PADS) 70 % PADS USE 4 PER DAY   alendronate (FOSAMAX) 70 MG tablet TAKE 1 TABLET BY MOUTH EVERY 7 DAYS. TAKE WITH A FULL GLASS OF WATER ON AN EMPTY STOMACH.   ammonium lactate (AMLACTIN) 12 % cream Apply 1 Application topically as needed for dry skin.   Ascorbic Acid (VITAMIN C) 100 MG tablet Take 500 mg by mouth daily.    aspirin 81 MG tablet Take 1 tablet (81 mg total) by mouth daily.   BD PEN NEEDLE NANO U/F 32G X 4 MM MISC USE AS DIRECTED FIVE TIMES A DAY.   brimonidine (ALPHAGAN P) 0.1 % SOLN Place 1 drop into the left eye 2 (two) times daily.   Continuous Blood Gluc Receiver (FREESTYLE LIBRE 14 DAY READER) DEVI USE DAILY TO CHECK BLOOD SUGAR   cycloSPORINE (RESTASIS) 0.05 % ophthalmic  emulsion 1 drop 2 (two) times daily.   dorzolamidel-timolol (COSOPT) 22.3-6.8 MG/ML SOLN ophthalmic solution Place 1 drop into the left eye 2 (two) times daily.   furosemide (LASIX) 20 MG tablet Take 1 tablet by mouth every other day.   gabapentin (NEURONTIN) 100 MG capsule TAKE 3 TO 4 CAPSULES BY MOUTH AT BEDTIME.   glucose blood (ONETOUCH ULTRA) test strip Use to check blood sugar once a day if needed   levothyroxine (SYNTHROID) 75 MCG tablet tablet   levothyroxine (SYNTHROID) 88 MCG tablet TAKE 1 TABLET (88 MCG TOTAL) BY MOUTH DAILY.   losartan (COZAAR) 50 MG tablet TAKE 1 TABLET BY MOUTH DAILY.   lovastatin (MEVACOR) 40 MG tablet TAKE 1 TABLET BY MOUTH ONCE DAILY WITH A MEAL.   Multiple Vitamins-Minerals (PRESERVISION AREDS 2) CAPS Take 1 capsule by mouth 2 (two) times daily.   NOVOLOG FLEXPEN 100 UNIT/ML FlexPen INJECT 3 TO 5 UNITS SUBCUTANEOUSLY BEFORE MEALS AS DIRECTED.   Omega-3 1000 MG CAPS Take 1 capsule by mouth 4 (four) times daily.   ONETOUCH VERIO test strip USE TO TEST BLOOD SUGAR 4 TIMES DAILY   pantoprazole (PROTONIX) 40 MG tablet Take 40 mg by mouth 2 (two) times daily as needed (indigestion).   potassium chloride SA (KLOR-CON M) 20 MEQ tablet TAKE 1 TABLET BY MOUTH DAILY AS NEEDED. TAKE WITH  WATER PILL- LASIX.   senna-docusate (SENOKOT-S) 8.6-50 MG tablet Take 1 tablet by mouth 2 (two) times daily. While on narcotics   TRESIBA FLEXTOUCH 100 UNIT/ML FlexTouch Pen INJECT 0.11 MLS (11 UNITS TOTAL) INTO THE SKIN DAILY. (Patient taking differently: 10 Units.)   Vitamin D, Ergocalciferol, (DRISDOL) 1.25 MG (50000 UNIT) CAPS capsule TAKE 1 CAPSULE BY MOUTH ONCE A MONTH.   [DISCONTINUED] Continuous Blood Gluc Sensor (FREESTYLE LIBRE 2 SENSOR) MISC APPLY TO ARM EVERY 14 DAYS TO MONITOR BLOOD SUGAR.   No facility-administered medications prior to visit.    Review of Systems  Constitutional:  Positive for fatigue. Negative for activity change, appetite change, chills and fever.   HENT:  Negative for congestion, postnasal drip, rhinorrhea, sinus pressure, sinus pain, sneezing and sore throat.   Eyes: Negative.   Respiratory:  Negative for cough, chest tightness, shortness of breath and wheezing.   Cardiovascular:  Negative for chest pain and palpitations.  Gastrointestinal:  Negative for abdominal pain, constipation, diarrhea, nausea and vomiting.  Endocrine: Negative for cold intolerance, heat intolerance, polydipsia and polyuria.       Insulin dependant diabetes   Genitourinary:  Negative for dyspareunia, dysuria, flank pain, frequency and urgency.  Musculoskeletal:  Negative for arthralgias, back pain and myalgias.  Skin:  Negative for rash.  Allergic/Immunologic: Negative for environmental allergies.  Neurological:  Negative for dizziness, weakness and headaches.  Hematological:  Negative for adenopathy.  Psychiatric/Behavioral:  The patient is not nervous/anxious.      Objective     Today's Vitals   11/23/22 0959  BP: 127/69  Pulse: 69  SpO2: 100%  Weight: 126 lb (57.2 kg)  Height: 4\' 9"  (1.448 m)   Body mass index is 27.27 kg/m.   Physical Exam Vitals and nursing note reviewed.  Constitutional:      Appearance: Normal appearance. She is well-developed.  HENT:     Head: Normocephalic and atraumatic.     Nose: Nose normal.     Mouth/Throat:     Mouth: Mucous membranes are moist.     Pharynx: Oropharynx is clear.  Eyes:     Extraocular Movements: Extraocular movements intact.     Conjunctiva/sclera: Conjunctivae normal.     Pupils: Pupils are equal, round, and reactive to light.  Cardiovascular:     Rate and Rhythm: Normal rate and regular rhythm.     Pulses: Normal pulses.     Heart sounds: Normal heart sounds.  Pulmonary:     Effort: Pulmonary effort is normal.     Breath sounds: Normal breath sounds.  Abdominal:     Palpations: Abdomen is soft.  Musculoskeletal:        General: Normal range of motion.     Cervical back: Normal  range of motion and neck supple.  Lymphadenopathy:     Cervical: No cervical adenopathy.  Skin:    General: Skin is warm and dry.     Capillary Refill: Capillary refill takes less than 2 seconds.  Neurological:     General: No focal deficit present.     Mental Status: She is alert and oriented to person, place, and time.     Cranial Nerves: No cranial nerve deficit.     Sensory: No sensory deficit.     Motor: No weakness.     Coordination: Coordination normal.     Gait: Gait normal.     Deep Tendon Reflexes: Reflexes normal.  Psychiatric:        Mood and Affect: Mood normal.  Behavior: Behavior normal.        Thought Content: Thought content normal.        Judgment: Judgment normal.       Assessment & Plan     1. Uncontrolled type 2 diabetes mellitus with hyperglycemia [facility]) Patient seeing endocrinology as prescribed.   2. Hypertension associated with diabetes ([place])-  on ACE/ARB due to diabetes Stable. Continue bp medication as prescribed.   3. Hyperlipidemia associated with type 2 diabetes mellitus ([place])-  on statin due to diabetes dx Stable. Continue lovastatin as prescribed   4. Grief Prolonged grief likely causing the small memory concerns. Will have patient and her daughter monitor for worsening symptoms. Will see her back for more concerning symptoms.   Return in about 6 months (around 05/24/2023) for medicare wellness, FBW at time of visit.        Ronnell Freshwater, NP  [facility] at [facility] [phone] (phone) [phone] (fax)  [place]

## 2022-12-02 ENCOUNTER — Ambulatory Visit: Payer: PPO

## 2022-12-18 ENCOUNTER — Other Ambulatory Visit: Payer: Self-pay | Admitting: Endocrinology

## 2022-12-24 DIAGNOSIS — F4321 Adjustment disorder with depressed mood: Secondary | ICD-10-CM

## 2022-12-24 HISTORY — DX: Adjustment disorder with depressed mood: F43.21

## 2022-12-25 ENCOUNTER — Ambulatory Visit (INDEPENDENT_AMBULATORY_CARE_PROVIDER_SITE_OTHER): Payer: HMO | Admitting: Nurse Practitioner

## 2022-12-25 ENCOUNTER — Encounter: Payer: Self-pay | Admitting: Nurse Practitioner

## 2022-12-25 VITALS — BP 126/64 | HR 78 | Ht <= 58 in | Wt 127.2 lb

## 2022-12-25 DIAGNOSIS — E11649 Type 2 diabetes mellitus with hypoglycemia without coma: Secondary | ICD-10-CM

## 2022-12-25 DIAGNOSIS — R2681 Unsteadiness on feet: Secondary | ICD-10-CM | POA: Diagnosis not present

## 2022-12-25 DIAGNOSIS — E063 Autoimmune thyroiditis: Secondary | ICD-10-CM

## 2022-12-25 DIAGNOSIS — Z794 Long term (current) use of insulin: Secondary | ICD-10-CM | POA: Diagnosis not present

## 2022-12-25 DIAGNOSIS — E1169 Type 2 diabetes mellitus with other specified complication: Secondary | ICD-10-CM

## 2022-12-25 DIAGNOSIS — E1142 Type 2 diabetes mellitus with diabetic polyneuropathy: Secondary | ICD-10-CM

## 2022-12-25 DIAGNOSIS — R413 Other amnesia: Secondary | ICD-10-CM | POA: Diagnosis not present

## 2022-12-25 DIAGNOSIS — E1159 Type 2 diabetes mellitus with other circulatory complications: Secondary | ICD-10-CM

## 2022-12-25 DIAGNOSIS — F320 Major depressive disorder, single episode, mild: Secondary | ICD-10-CM

## 2022-12-25 DIAGNOSIS — E785 Hyperlipidemia, unspecified: Secondary | ICD-10-CM

## 2022-12-25 DIAGNOSIS — F4321 Adjustment disorder with depressed mood: Secondary | ICD-10-CM | POA: Diagnosis not present

## 2022-12-25 DIAGNOSIS — M810 Age-related osteoporosis without current pathological fracture: Secondary | ICD-10-CM | POA: Diagnosis not present

## 2022-12-25 DIAGNOSIS — I152 Hypertension secondary to endocrine disorders: Secondary | ICD-10-CM | POA: Diagnosis not present

## 2022-12-25 NOTE — Patient Instructions (Signed)
It was a pleasure to meet you today. Thank you for entrusting me with your care.   I have included information on the back of this packet that may be helpful with your concerns today. Please take the time to look over these and let us know if you have any questions or concerns.

## 2022-12-25 NOTE — Progress Notes (Signed)
Orma Render, DNP, AGNP-c [facility] [address] [place], [place] [address] [place] [phone]   New patient visit   Patient: Jennifer Bailey   DOB: Sep 08, 1935   88 y.o. Female  MRN: [id number] Visit Date: 12/25/2022  Patient Care Team: Cincere Zorn, Coralee Pesa, NP as PCP - General (Nurse Practitioner) Elayne Snare, MD as Consulting Physician (Endocrinology)  Today's Vitals   12/25/22 1019  BP: 126/64  Pulse: 78  SpO2: 98%  Weight: 127 lb 3.2 oz (57.7 kg)  Height: 4\' 10"  (1.473 m)   Body mass index is 26.58 kg/m.   Today's healthcare provider: Orma Render, NP   Chief Complaint  Patient presents with   other    New pt. Est. Cognitive issues/depression, broke leg two years ago  pt. Daughter with her and brought in a paper with some concerns on it for you to look at, they are worried about her memory and depression, living alone    Subjective    DEAH OTSUKA is a 88 y.o. female who presents today as a new patient to establish care.   Concerns to address include: Depression Memory Changes  Jennifer Bailey is accompanied by her daughter today who helps provide history and details about the patients condition. Macayla's daughter, Jennifer Bailey, expresses concerns about potential memory changes and symptoms indicative of sadness or depression. Notable life events include the loss of her husband eight years ago, her sister a year and a half ago, and a close neighbor in December, which have contributed to these concerns.  Jennifer Bailey currently lives alone and wishes to remain in her home at this time. She acknowledges feeling lonely and sad at times, particularly recently with the loss of her loved ones. She expresses that living alone has intensified these emotions. She is actively involved with her family, particularly her two daughters, Jennifer Bailey and Jennifer Bailey. She reports she tries to remain active with exercise and social situations including religious gatherings.  She is involved in a program called "[facility]" which provides additional support for a limited number of hours annually. The patient's daughters are contemplating further in-home support to aid with daily tasks and offer companionship. The patient feels secure in her home environment and takes measures to ensure her safety.  Jennifer Bailey has previously stopped taking her gabapentin due to concerns that this was exacerbating her memory or depressive issues. She has since restarted this due to the significant pain that she experienced after stopping. She is on a very low dose only once a day at this time, which is not concerning.   Jennifer Bailey has concerns about blood sugar control, primarily fluctuations and hypoglycemic episodes. She is currently using a CGM for monitoring and has considered adding an insulin pump to her regimen for improved management. She is seeing endocrinology for help with management. I do feel that continuation of the CGM is vital given the changes she has experienced. I also feel that use of an insulin pump may be beneficial to help better manage to input of insulin and also help prevent possible hypoglycemic episodes.  The patient denies any significant appetite or meal habit changes, maintaining three meals a day and self-managing her medications.   Jennifer Bailey expresses concerns about her mothers gait and posture. She reports observing a worsening shuffling gait and downward gaze with hunched posture while ambulating. She is concerned about the risk of falls. There is an interest in physical therapy to improve her gait and reduce the risk of falls.  The patient, along with her daughters, is considering various options to address her sadness and memory concerns. They are open to evaluation with neuropsychology to help monitor her memory changes and the impact her mood has on this. While the patient is reluctant to use medication for depression, she is open to other interventions that may  enhance her overall well-being.  History reviewed and reveals the following: Past Medical History:  Diagnosis Date   Diabetes mellitus without complication ([place])    Thyroid disease    Past Surgical History:  Procedure Laterality Date   ABDOMINAL HYSTERECTOMY     CHOLECYSTECTOMY     COLON SURGERY     ORIF TIBIA PLATEAU Right 12/04/2020   Procedure: OPEN REDUCTION INTERNAL FIXATION (ORIF) TIBIAL PLATEAU;  Surgeon: Shona Needles, MD;  Location: [facility];  Service: Orthopedics;  Laterality: Right;   Family Status  Relation Name Status   Mother  (Not Specified)   Brother  (Not Specified)   Family History  Problem Relation Age of Onset   Cancer Mother    Diabetes Brother    Social History   Socioeconomic History   Marital status: Widowed    Spouse name: Not on file   Number of children: Not on file   Years of education: Not on file   Highest education level: Not on file  Occupational History   Not on file  Tobacco Use   Smoking status: Never   Smokeless tobacco: Never  Vaping Use   Vaping Use: Never used  Substance and Sexual Activity   Alcohol use: Never   Drug use: Never   Sexual activity: Not on file  Other Topics Concern   Not on file  Social History Narrative   Not on file   Social Determinants of Health   Financial Resource Strain: Low Risk  (10/01/2021)   Overall Financial Resource Strain (CARDIA)    Difficulty of Paying Living Expenses: Not hard at all  Food Insecurity: No Food Insecurity (10/01/2021)   Hunger Vital Sign    Worried About Running Out of Food in the Last Year: Never true    [place] in the Last Year: Never true  Transportation Needs: No Transportation Needs (10/01/2021)   PRAPARE - Hydrologist (Medical): No    Lack of Transportation (Non-Medical): No  Physical Activity: Not on file  Stress: Stress Concern Present (10/01/2021)   [place]     Feeling of Stress : To some extent  Social Connections: Not on file   Outpatient Medications Prior to Visit  Medication Sig Note   Alcohol Swabs (ALCOHOL PADS) 70 % PADS USE 4 PER DAY    alendronate (FOSAMAX) 70 MG tablet TAKE 1 TABLET BY MOUTH EVERY 7 DAYS. TAKE WITH A FULL GLASS OF WATER ON AN EMPTY STOMACH.    ammonium lactate (AMLACTIN) 12 % cream Apply 1 Application topically as needed for dry skin.    Ascorbic Acid (VITAMIN C) 100 MG tablet Take 500 mg by mouth daily.     aspirin 81 MG tablet Take 1 tablet (81 mg total) by mouth daily.    BD PEN NEEDLE NANO U/F 32G X 4 MM MISC USE AS DIRECTED FIVE TIMES A DAY.    brimonidine (ALPHAGAN P) 0.1 % SOLN Place 1 drop into the left eye 2 (two) times daily.    Continuous Blood Gluc Receiver (FREESTYLE LIBRE 14 DAY READER) DEVI USE DAILY  TO CHECK BLOOD SUGAR    Continuous Blood Gluc Sensor (FREESTYLE LIBRE 2 SENSOR) MISC APPLY TO ARM EVERY 14 DAYS TO MONITOR BLOOD SUGAR.    cycloSPORINE (RESTASIS) 0.05 % ophthalmic emulsion 1 drop 2 (two) times daily.    dorzolamidel-timolol (COSOPT) 22.3-6.8 MG/ML SOLN ophthalmic solution Place 1 drop into the left eye 2 (two) times daily.    gabapentin (NEURONTIN) 100 MG capsule TAKE 3 TO 4 CAPSULES BY MOUTH AT BEDTIME.    glucose blood (ONETOUCH ULTRA) test strip Use to check blood sugar once a day if needed    levothyroxine (SYNTHROID) 88 MCG tablet TAKE 1 TABLET (88 MCG TOTAL) BY MOUTH DAILY.    losartan (COZAAR) 50 MG tablet TAKE 1 TABLET BY MOUTH DAILY.    lovastatin (MEVACOR) 40 MG tablet TAKE 1 TABLET BY MOUTH ONCE DAILY WITH A MEAL.    Multiple Vitamins-Minerals (PRESERVISION AREDS 2) CAPS Take 1 capsule by mouth 2 (two) times daily.    NOVOLOG FLEXPEN 100 UNIT/ML FlexPen INJECT 3 TO 5 UNITS SUBCUTANEOUSLY BEFORE MEALS AS DIRECTED.    Omega-3 1000 MG CAPS Take 1 capsule by mouth 4 (four) times daily.    ONETOUCH VERIO test strip USE TO TEST BLOOD SUGAR 4 TIMES DAILY    pantoprazole (PROTONIX) 40  MG tablet Take 40 mg by mouth 2 (two) times daily as needed (indigestion).    potassium chloride SA (KLOR-CON M) 20 MEQ tablet TAKE 1 TABLET BY MOUTH DAILY AS NEEDED. TAKE WITH WATER PILL- LASIX.    senna-docusate (SENOKOT-S) 8.6-50 MG tablet Take 1 tablet by mouth 2 (two) times daily. While on narcotics    TRESIBA FLEXTOUCH 100 UNIT/ML FlexTouch Pen INJECT 0.11 MLS (11 UNITS TOTAL) INTO THE SKIN DAILY. (Patient taking differently: 10 Units.)    Vitamin D, Ergocalciferol, (DRISDOL) 1.25 MG (50000 UNIT) CAPS capsule TAKE 1 CAPSULE BY MOUTH ONCE A MONTH.    furosemide (LASIX) 20 MG tablet Take 1 tablet by mouth every other day. (Patient not taking: Reported on 12/25/2022) 12/25/2022: prn   [DISCONTINUED] levothyroxine (SYNTHROID) 75 MCG tablet tablet (Patient not taking: Reported on 12/25/2022)    No facility-administered medications prior to visit.   Allergies  Allergen Reactions   Macrodantin [Nitrofurantoin Macrocrystal] Swelling   Prednisone     Make real "shaky-like, nervious"   Penicillins Rash    Has patient had a PCN reaction causing immediate rash, facial/tongue/throat swelling, SOB or lightheadedness with hypotension:Yes Has patient had a PCN reaction causing severe rash involving mucus membranes or skin necrosis: No Has patient had a PCN reaction that required hospitalization: No Has patient had a PCN reaction occurring within the last 10 years:No If all of the above answers are "NO", then may proceed with Cephalosporin use.    Immunization History  Administered Date(s) Administered   Fluad Quad(high Dose 65+) 07/06/2019   Influenza, High Dose Seasonal PF 06/29/2016, 07/26/2017, 07/18/2018   Influenza,inj,Quad PF,6+ Mos 07/11/2015   Influenza-Unspecified 06/19/2013, 07/13/2014, 07/25/2020, 07/05/2022   PFIZER(Purple Top)SARS-COV-2 Vaccination 11/17/2019, 12/10/2019, 07/25/2020   Pneumococcal Conjugate-13 02/14/2014   Pneumococcal Polysaccharide-23 09/08/2004, 10/25/2017   Zoster  Recombinat (Shingrix) 04/22/2020, 07/08/2020    Health Maintenance Due Health Maintenance Topics with due status: Overdue     Topic Date Due   DTaP/Tdap/Td Never done   OPHTHALMOLOGY EXAM 06/30/2022   Medicare Annual Wellness (AWV) 10/01/2022    Review of Systems All review of systems negative except what is listed in the HPI   Objective    BP 126/64  Pulse 78   Ht 4\' 10"  (1.473 m)   Wt 127 lb 3.2 oz (57.7 kg)   SpO2 98%   BMI 26.58 kg/m  Physical Exam Vitals and nursing note reviewed.  Constitutional:      General: She is not in acute distress.    Appearance: Normal appearance. She is not ill-appearing.  HENT:     Head: Normocephalic.  Eyes:     Pupils: Pupils are equal, round, and reactive to light.  Neck:     Vascular: No carotid bruit.  Cardiovascular:     Rate and Rhythm: Normal rate and regular rhythm.     Pulses: Normal pulses.     Heart sounds: Normal heart sounds. No murmur heard. Pulmonary:     Effort: Pulmonary effort is normal.     Breath sounds: Normal breath sounds.  Abdominal:     Palpations: Abdomen is soft.  Musculoskeletal:     Right lower leg: No edema.     Left lower leg: No edema.  Lymphadenopathy:     Cervical: No cervical adenopathy.  Skin:    General: Skin is warm and dry.     Capillary Refill: Capillary refill takes less than 2 seconds.  Neurological:     Mental Status: She is alert and oriented to person, place, and time.     Sensory: No sensory deficit.     Motor: Weakness present.     Gait: Gait abnormal.  Psychiatric:        Attention and Perception: Attention normal.        Mood and Affect: Mood and affect normal.        Speech: Speech is delayed.        Behavior: Behavior normal. Behavior is cooperative.        Thought Content: Thought content normal.        Cognition and Memory: She exhibits impaired recent memory.     No results found for any visits on 12/25/22.  Assessment & Plan      Problem List Items  Addressed This Visit     Hyperlipidemia associated with type 2 diabetes mellitus ([place]) (Chronic)    At this time there are no concerns present. We will not obtain labs today as these have been recently collected.  Plan: - follow a diet that is low in fat to help prevent worsening cholesterol levels.  - I have sent a referral for home health physical therapy, which can help keep you active to allow for exercise, which is beneficial to help lower your cholesterol and reduce your heart risks.       Type 2 diabetes mellitus with hypoglycemia without coma, with long-term current use of insulin ([facility])    Chronic diabetes not well controlled at this time due to significant hypoglycemic episodes. Fortunately, she is utilizing a CGM which will alert her of lows, however, improved control is recommended to ensure her safety and health. She is followed with endocrinology. We discussed the option of an insulin pump today. I do feel this would be helpful for her management to allow for the most appropriate dosing based on her blood sugar levels.  Plan: - I would like you to speak with endocrinology about the option of an insulin pump to see if this is something that is an option for you. In theory, this may help reduce the episodes of low blood sugar.  - Continue to monitor your blood sugars closely and be sure to treat any low levels quickly  to prevent them from worsening.  - Let me or endocrinology know if you are continuously getting low levels, we may need to make changes to your medications.       Relevant Orders   Ambulatory referral to Home Health   Hypertension associated with diabetes ([place])    Blood pressure is well controlled today with no alarm symptoms. Given the patients age and co-morbidities, recommended blood pressure goals elevated to <150/90 to prevent hypotensive episodes. No concerns are present today.  Plan: - I would like you to continue your current medications. - Monitor your blood  pressure at home- your blood pressure goal is less than 150/90, if you find that your blood pressure is staying higher than this on average, please let me know. I would like your blood pressure to stay higher than 110/60, if it starts to fall lower than that, please let me know, as well.        Diabetic polyneuropathy associated with type 2 diabetes mellitus ([place])    She had concerns that gabapentin was causing memory issues. We reviewed her dosage and how she is taking the medication. At this time, I do not feel that the current dose and timing of medication is contributing to her memory or mood changes. I do feel that the benefits of the medication on her pain control is significantly beneficial to her quality of life and overrides the concerns.  Plan: - I would like you to continue with the gabapentin at the current dose. I do not feel that this is contributing to your memory changes or your mood, actually, this is likely helping to prevent worsening of both by helping to control your pain and allow you to have quality of life.       Relevant Orders   Ambulatory referral to Home Health   Osteoporosis    Chronic osteoporosis with a history of fall and fracture. Given her gait and stance, I am concerned that she is at increased risk of fall, which could result in significant injury. At her age, a hip fracture could be very detrimental.  Plan: - I have ordered home health to come and start PT to help with your gait to help decrease the risk of falls. They will contact you to schedule this.  - We will plan to continue your fosamax at this time.       Relevant Orders   Ambulatory referral to [facility]   Acquired autoimmune hypothyroidism    She is currently followed by endocrinology with close monitoring and evaluation. Given this specialized following, we can plan to alternate our visits to help reduce the burden of medical visits and overlap in care while continuing to monitor closely for  safety and efficacy of medication.  Plan: - No changes to your medication at this time.  - Your recent labs look good. We do not need to recheck these at this time.  - Continue to follow closely with endocrinology. We can plan to schedule your visits with me in between your visits with endocrinology so you have close follow-up but we are not overalpping care at the same times.       Grief - Primary    Please see depression.       Relevant Orders   Ambulatory referral to Neuropsychology   Ambulatory referral to Home Health   Depression, major, single episode, mild ([place])    The patient exhibits symptoms of depression, most likely related to recent losses. At this  time there are no alarm symptoms present, but I do not want her symptoms to worsen. We discussed treatment options today. Se does not wish to start on medication at this time. I do believe that her memory is a component of her depressive symptoms, which is understandable.  Plan: - I have sent a referral for neurophsychological testing to help identify memory concerns. This may also help identify ways to help with your sadness. They will contact you to schedule this.  - If you decide that you would like to start medication, please reach out to me at any time and we can address this. There are many medications that can be very helpful in low doses without side effects to help you feel better.  - We can also consider counseling. I will be happy to send a referral if you decide this is something you would like to do.  - Please let me know if you feel like your mood is worsening or if you have a hard time getting motivated to do things you normally like to do.  - Continue to stay connected to family, friends, and your church.      Relevant Orders   Ambulatory referral to Neuropsychology   Ambulatory referral to [place] gait when walking    Gait and imbalance issues are present with increased risk of falls. I do feel that  physical therapy and gait training would be beneficial to help reduce the risk of falls, specifically with the presence of osteoporosis and the risks associated with that.  Plan: - I have sent a referral for home health evaluation and physical therapy to help with walking and moving to ensure that you are safe. They will contact you to schedule.  - Please continue to use safety when walking and moving around the house and when you are out.       Relevant Orders   Ambulatory referral to [facility]   Memory changes    There is evidence of memory changes present. She has recently gone through significant loss and changes in her life, which have caused some depression which could be contributing. I feel that neuropsychological testing is most appropriate at this time to monitor for memory changes to determine if these symptoms are based on mood, memory loss, or both.  Plan: - I have sent a referral for evaluation for memory. They will contact you to schedule an evaluation. If you feel that your memory is worsening, please let me know.  - We may need to consider changes in the future to help with independence safely if your memory continues to decline, we will do all we can to allow you to stay as independent as possible while ensuring you are safe.       Relevant Orders   Ambulatory referral to Neuropsychology   Ambulatory referral to Home Health     No follow-ups on file.      Daeton Kluth, Coralee Pesa, NP, DNP, AGNP-C [facility]

## 2023-01-02 DIAGNOSIS — F0390 Unspecified dementia without behavioral disturbance: Secondary | ICD-10-CM | POA: Insufficient documentation

## 2023-01-02 DIAGNOSIS — R2681 Unsteadiness on feet: Secondary | ICD-10-CM | POA: Insufficient documentation

## 2023-01-02 DIAGNOSIS — F320 Major depressive disorder, single episode, mild: Secondary | ICD-10-CM | POA: Insufficient documentation

## 2023-01-02 DIAGNOSIS — R413 Other amnesia: Secondary | ICD-10-CM | POA: Insufficient documentation

## 2023-01-02 NOTE — Assessment & Plan Note (Signed)
At this time there are no concerns present. We will not obtain labs today as these have been recently collected.  Plan: - follow a diet that is low in fat to help prevent worsening cholesterol levels.  - I have sent a referral for home health physical therapy, which can help keep you active to allow for exercise, which is beneficial to help lower your cholesterol and reduce your heart risks.

## 2023-01-02 NOTE — Assessment & Plan Note (Signed)
Please see depression. 

## 2023-01-02 NOTE — Assessment & Plan Note (Signed)
There is evidence of memory changes present. She has recently gone through significant loss and changes in her life, which have caused some depression which could be contributing. I feel that neuropsychological testing is most appropriate at this time to monitor for memory changes to determine if these symptoms are based on mood, memory loss, or both.  Plan: - I have sent a referral for evaluation for memory. They will contact you to schedule an evaluation. If you feel that your memory is worsening, please let me know.  - We may need to consider changes in the future to help with independence safely if your memory continues to decline, we will do all we can to allow you to stay as independent as possible while ensuring you are safe.

## 2023-01-02 NOTE — Assessment & Plan Note (Signed)
The patient exhibits symptoms of depression, most likely related to recent losses. At this time there are no alarm symptoms present, but I do not want her symptoms to worsen. We discussed treatment options today. Se does not wish to start on medication at this time. I do believe that her memory is a component of her depressive symptoms, which is understandable.  Plan: - I have sent a referral for neurophsychological testing to help identify memory concerns. This may also help identify ways to help with your sadness. They will contact you to schedule this.  - If you decide that you would like to start medication, please reach out to me at any time and we can address this. There are many medications that can be very helpful in low doses without side effects to help you feel better.  - We can also consider counseling. I will be happy to send a referral if you decide this is something you would like to do.  - Please let me know if you feel like your mood is worsening or if you have a hard time getting motivated to do things you normally like to do.  - Continue to stay connected to family, friends, and your church.

## 2023-01-02 NOTE — Assessment & Plan Note (Signed)
Chronic diabetes not well controlled at this time due to significant hypoglycemic episodes. Fortunately, she is utilizing a CGM which will alert her of lows, however, improved control is recommended to ensure her safety and health. She is followed with endocrinology. We discussed the option of an insulin pump today. I do feel this would be helpful for her management to allow for the most appropriate dosing based on her blood sugar levels.  Plan: - I would like you to speak with endocrinology about the option of an insulin pump to see if this is something that is an option for you. In theory, this may help reduce the episodes of low blood sugar.  - Continue to monitor your blood sugars closely and be sure to treat any low levels quickly to prevent them from worsening.  - Let me or endocrinology know if you are continuously getting low levels, we may need to make changes to your medications.

## 2023-01-02 NOTE — Assessment & Plan Note (Addendum)
Chronic osteoporosis with a history of fall and fracture. Given her gait and stance, I am concerned that she is at increased risk of fall, which could result in significant injury. At her age, a hip fracture could be very detrimental.  Plan: - I have ordered home health to come and start PT to help with your gait to help decrease the risk of falls. They will contact you to schedule this.  - We will plan to continue your fosamax at this time.

## 2023-01-02 NOTE — Assessment & Plan Note (Signed)
She had concerns that gabapentin was causing memory issues. We reviewed her dosage and how she is taking the medication. At this time, I do not feel that the current dose and timing of medication is contributing to her memory or mood changes. I do feel that the benefits of the medication on her pain control is significantly beneficial to her quality of life and overrides the concerns.  Plan: - I would like you to continue with the gabapentin at the current dose. I do not feel that this is contributing to your memory changes or your mood, actually, this is likely helping to prevent worsening of both by helping to control your pain and allow you to have quality of life.

## 2023-01-02 NOTE — Assessment & Plan Note (Signed)
She is currently followed by endocrinology with close monitoring and evaluation. Given this specialized following, we can plan to alternate our visits to help reduce the burden of medical visits and overlap in care while continuing to monitor closely for safety and efficacy of medication.  Plan: - No changes to your medication at this time.  - Your recent labs look good. We do not need to recheck these at this time.  - Continue to follow closely with endocrinology. We can plan to schedule your visits with me in between your visits with endocrinology so you have close follow-up but we are not overalpping care at the same times.

## 2023-01-02 NOTE — Assessment & Plan Note (Signed)
Gait and imbalance issues are present with increased risk of falls. I do feel that physical therapy and gait training would be beneficial to help reduce the risk of falls, specifically with the presence of osteoporosis and the risks associated with that.  Plan: - I have sent a referral for home health evaluation and physical therapy to help with walking and moving to ensure that you are safe. They will contact you to schedule.  - Please continue to use safety when walking and moving around the house and when you are out.

## 2023-01-02 NOTE — Assessment & Plan Note (Signed)
Blood pressure is well controlled today with no alarm symptoms. Given the patients age and co-morbidities, recommended blood pressure goals elevated to <150/90 to prevent hypotensive episodes. No concerns are present today.  Plan: - I would like you to continue your current medications. - Monitor your blood pressure at home- your blood pressure goal is less than 150/90, if you find that your blood pressure is staying higher than this on average, please let me know. I would like your blood pressure to stay higher than 110/60, if it starts to fall lower than that, please let me know, as well.

## 2023-01-04 DIAGNOSIS — H401221 Low-tension glaucoma, left eye, mild stage: Secondary | ICD-10-CM | POA: Diagnosis not present

## 2023-01-04 DIAGNOSIS — Z794 Long term (current) use of insulin: Secondary | ICD-10-CM | POA: Diagnosis not present

## 2023-01-04 DIAGNOSIS — E119 Type 2 diabetes mellitus without complications: Secondary | ICD-10-CM | POA: Diagnosis not present

## 2023-01-04 DIAGNOSIS — H40011 Open angle with borderline findings, low risk, right eye: Secondary | ICD-10-CM | POA: Diagnosis not present

## 2023-01-04 DIAGNOSIS — H04123 Dry eye syndrome of bilateral lacrimal glands: Secondary | ICD-10-CM | POA: Diagnosis not present

## 2023-01-04 DIAGNOSIS — H353131 Nonexudative age-related macular degeneration, bilateral, early dry stage: Secondary | ICD-10-CM | POA: Diagnosis not present

## 2023-01-07 DIAGNOSIS — I152 Hypertension secondary to endocrine disorders: Secondary | ICD-10-CM | POA: Diagnosis not present

## 2023-01-07 DIAGNOSIS — F320 Major depressive disorder, single episode, mild: Secondary | ICD-10-CM | POA: Diagnosis not present

## 2023-01-07 DIAGNOSIS — K219 Gastro-esophageal reflux disease without esophagitis: Secondary | ICD-10-CM | POA: Diagnosis not present

## 2023-01-07 DIAGNOSIS — Z9181 History of falling: Secondary | ICD-10-CM | POA: Diagnosis not present

## 2023-01-07 DIAGNOSIS — E1159 Type 2 diabetes mellitus with other circulatory complications: Secondary | ICD-10-CM | POA: Diagnosis not present

## 2023-01-07 DIAGNOSIS — H409 Unspecified glaucoma: Secondary | ICD-10-CM | POA: Diagnosis not present

## 2023-01-07 DIAGNOSIS — Z7982 Long term (current) use of aspirin: Secondary | ICD-10-CM | POA: Diagnosis not present

## 2023-01-07 DIAGNOSIS — F419 Anxiety disorder, unspecified: Secondary | ICD-10-CM | POA: Diagnosis not present

## 2023-01-07 DIAGNOSIS — E785 Hyperlipidemia, unspecified: Secondary | ICD-10-CM | POA: Diagnosis not present

## 2023-01-07 DIAGNOSIS — E1142 Type 2 diabetes mellitus with diabetic polyneuropathy: Secondary | ICD-10-CM | POA: Diagnosis not present

## 2023-01-07 DIAGNOSIS — D649 Anemia, unspecified: Secondary | ICD-10-CM | POA: Diagnosis not present

## 2023-01-07 DIAGNOSIS — E063 Autoimmune thyroiditis: Secondary | ICD-10-CM | POA: Diagnosis not present

## 2023-01-07 DIAGNOSIS — E11649 Type 2 diabetes mellitus with hypoglycemia without coma: Secondary | ICD-10-CM | POA: Diagnosis not present

## 2023-01-07 DIAGNOSIS — M810 Age-related osteoporosis without current pathological fracture: Secondary | ICD-10-CM | POA: Diagnosis not present

## 2023-01-07 DIAGNOSIS — E1169 Type 2 diabetes mellitus with other specified complication: Secondary | ICD-10-CM | POA: Diagnosis not present

## 2023-01-07 DIAGNOSIS — Z794 Long term (current) use of insulin: Secondary | ICD-10-CM | POA: Diagnosis not present

## 2023-01-11 ENCOUNTER — Ambulatory Visit
Admission: RE | Admit: 2023-01-11 | Discharge: 2023-01-11 | Disposition: A | Discharge status: Home / Self Care | Payer: PPO | Source: Ambulatory Visit | Attending: Nurse Practitioner | Admitting: Nurse Practitioner

## 2023-01-11 DIAGNOSIS — Z1231 Encounter for screening mammogram for malignant neoplasm of breast: Secondary | ICD-10-CM | POA: Diagnosis not present

## 2023-01-12 ENCOUNTER — Encounter: Payer: Self-pay | Admitting: Podiatry

## 2023-01-13 DIAGNOSIS — H409 Unspecified glaucoma: Secondary | ICD-10-CM | POA: Diagnosis not present

## 2023-01-13 DIAGNOSIS — E785 Hyperlipidemia, unspecified: Secondary | ICD-10-CM | POA: Diagnosis not present

## 2023-01-13 DIAGNOSIS — E1142 Type 2 diabetes mellitus with diabetic polyneuropathy: Secondary | ICD-10-CM | POA: Diagnosis not present

## 2023-01-13 DIAGNOSIS — Z7982 Long term (current) use of aspirin: Secondary | ICD-10-CM | POA: Diagnosis not present

## 2023-01-13 DIAGNOSIS — E11649 Type 2 diabetes mellitus with hypoglycemia without coma: Secondary | ICD-10-CM | POA: Diagnosis not present

## 2023-01-13 DIAGNOSIS — K219 Gastro-esophageal reflux disease without esophagitis: Secondary | ICD-10-CM | POA: Diagnosis not present

## 2023-01-13 DIAGNOSIS — E063 Autoimmune thyroiditis: Secondary | ICD-10-CM | POA: Diagnosis not present

## 2023-01-13 DIAGNOSIS — I152 Hypertension secondary to endocrine disorders: Secondary | ICD-10-CM | POA: Diagnosis not present

## 2023-01-13 DIAGNOSIS — D649 Anemia, unspecified: Secondary | ICD-10-CM | POA: Diagnosis not present

## 2023-01-13 DIAGNOSIS — E1159 Type 2 diabetes mellitus with other circulatory complications: Secondary | ICD-10-CM | POA: Diagnosis not present

## 2023-01-13 DIAGNOSIS — Z794 Long term (current) use of insulin: Secondary | ICD-10-CM | POA: Diagnosis not present

## 2023-01-13 DIAGNOSIS — Z9181 History of falling: Secondary | ICD-10-CM | POA: Diagnosis not present

## 2023-01-13 DIAGNOSIS — F419 Anxiety disorder, unspecified: Secondary | ICD-10-CM | POA: Diagnosis not present

## 2023-01-13 DIAGNOSIS — M810 Age-related osteoporosis without current pathological fracture: Secondary | ICD-10-CM | POA: Diagnosis not present

## 2023-01-13 DIAGNOSIS — E1169 Type 2 diabetes mellitus with other specified complication: Secondary | ICD-10-CM | POA: Diagnosis not present

## 2023-01-13 DIAGNOSIS — F320 Major depressive disorder, single episode, mild: Secondary | ICD-10-CM | POA: Diagnosis not present

## 2023-01-20 DIAGNOSIS — M810 Age-related osteoporosis without current pathological fracture: Secondary | ICD-10-CM | POA: Diagnosis not present

## 2023-01-20 DIAGNOSIS — H401221 Low-tension glaucoma, left eye, mild stage: Secondary | ICD-10-CM | POA: Diagnosis not present

## 2023-01-20 DIAGNOSIS — I10 Essential (primary) hypertension: Secondary | ICD-10-CM | POA: Diagnosis not present

## 2023-01-20 DIAGNOSIS — E039 Hypothyroidism, unspecified: Secondary | ICD-10-CM | POA: Diagnosis not present

## 2023-01-20 DIAGNOSIS — Z794 Long term (current) use of insulin: Secondary | ICD-10-CM | POA: Diagnosis not present

## 2023-01-20 DIAGNOSIS — E1142 Type 2 diabetes mellitus with diabetic polyneuropathy: Secondary | ICD-10-CM | POA: Diagnosis not present

## 2023-01-20 DIAGNOSIS — H409 Unspecified glaucoma: Secondary | ICD-10-CM | POA: Diagnosis not present

## 2023-01-20 DIAGNOSIS — E785 Hyperlipidemia, unspecified: Secondary | ICD-10-CM | POA: Diagnosis not present

## 2023-01-20 DIAGNOSIS — E1169 Type 2 diabetes mellitus with other specified complication: Secondary | ICD-10-CM | POA: Diagnosis not present

## 2023-01-20 DIAGNOSIS — E663 Overweight: Secondary | ICD-10-CM | POA: Diagnosis not present

## 2023-01-20 DIAGNOSIS — E1165 Type 2 diabetes mellitus with hyperglycemia: Secondary | ICD-10-CM | POA: Diagnosis not present

## 2023-01-20 DIAGNOSIS — E11649 Type 2 diabetes mellitus with hypoglycemia without coma: Secondary | ICD-10-CM | POA: Diagnosis not present

## 2023-01-26 ENCOUNTER — Telehealth: Payer: Self-pay

## 2023-01-26 NOTE — Telephone Encounter (Signed)
Patient daughter would like to know if an insulin pump would be an option for the patient. Patient is getting very confused with current medication regiment.

## 2023-01-26 NOTE — Telephone Encounter (Signed)
Patient daughter advised and will discuss other options at appointment

## 2023-01-27 ENCOUNTER — Telehealth: Payer: Self-pay | Admitting: Nurse Practitioner

## 2023-01-27 NOTE — Telephone Encounter (Signed)
Contacted Jennifer Bailey to schedule their annual wellness visit. Appointment made for 02/16/23.  Jennifer Bailey AWV direct phone # [phone]

## 2023-01-28 ENCOUNTER — Ambulatory Visit: Payer: HMO | Admitting: Podiatry

## 2023-02-02 ENCOUNTER — Telehealth: Payer: Self-pay

## 2023-02-02 DIAGNOSIS — E1165 Type 2 diabetes mellitus with hyperglycemia: Secondary | ICD-10-CM

## 2023-02-02 MED ORDER — INSULIN LISPRO (1 UNIT DIAL) 100 UNIT/ML (KWIKPEN): PEN_INJECTOR | SUBCUTANEOUS | 11 refills | Status: DC

## 2023-02-02 NOTE — Telephone Encounter (Signed)
Fax received from [employer] indicating Novolog is a Tier 3 med, while Insulin Lispro is a Tier 2. They are asking to change patient's medication.  Per Dr. Lucianne Muss this change request is approved.   Rx sent to pharmacy.  LVM for patient to return call to advise of change.

## 2023-02-03 NOTE — Telephone Encounter (Signed)
Spoke with patient and advised. She had no questions or concerns.

## 2023-02-04 ENCOUNTER — Other Ambulatory Visit: Payer: Self-pay

## 2023-02-04 DIAGNOSIS — E063 Autoimmune thyroiditis: Secondary | ICD-10-CM | POA: Diagnosis not present

## 2023-02-04 DIAGNOSIS — D649 Anemia, unspecified: Secondary | ICD-10-CM | POA: Diagnosis not present

## 2023-02-04 DIAGNOSIS — E785 Hyperlipidemia, unspecified: Secondary | ICD-10-CM | POA: Diagnosis not present

## 2023-02-04 DIAGNOSIS — E11649 Type 2 diabetes mellitus with hypoglycemia without coma: Secondary | ICD-10-CM | POA: Diagnosis not present

## 2023-02-04 DIAGNOSIS — H409 Unspecified glaucoma: Secondary | ICD-10-CM | POA: Diagnosis not present

## 2023-02-04 DIAGNOSIS — K219 Gastro-esophageal reflux disease without esophagitis: Secondary | ICD-10-CM | POA: Diagnosis not present

## 2023-02-04 DIAGNOSIS — I152 Hypertension secondary to endocrine disorders: Secondary | ICD-10-CM | POA: Diagnosis not present

## 2023-02-04 DIAGNOSIS — Z794 Long term (current) use of insulin: Secondary | ICD-10-CM | POA: Diagnosis not present

## 2023-02-04 DIAGNOSIS — F419 Anxiety disorder, unspecified: Secondary | ICD-10-CM | POA: Diagnosis not present

## 2023-02-04 DIAGNOSIS — E1169 Type 2 diabetes mellitus with other specified complication: Secondary | ICD-10-CM | POA: Diagnosis not present

## 2023-02-04 DIAGNOSIS — F320 Major depressive disorder, single episode, mild: Secondary | ICD-10-CM | POA: Diagnosis not present

## 2023-02-04 DIAGNOSIS — Z9181 History of falling: Secondary | ICD-10-CM | POA: Diagnosis not present

## 2023-02-04 DIAGNOSIS — E1142 Type 2 diabetes mellitus with diabetic polyneuropathy: Secondary | ICD-10-CM | POA: Diagnosis not present

## 2023-02-04 DIAGNOSIS — Z7982 Long term (current) use of aspirin: Secondary | ICD-10-CM | POA: Diagnosis not present

## 2023-02-04 DIAGNOSIS — M810 Age-related osteoporosis without current pathological fracture: Secondary | ICD-10-CM | POA: Diagnosis not present

## 2023-02-04 DIAGNOSIS — E1159 Type 2 diabetes mellitus with other circulatory complications: Secondary | ICD-10-CM | POA: Diagnosis not present

## 2023-02-04 MED ORDER — GABAPENTIN 100 MG PO CAPS: 400.0000 mg | ORAL_CAPSULE | Freq: Every day | ORAL | 3 refills | Status: DC

## 2023-02-08 ENCOUNTER — Encounter: Payer: Self-pay | Admitting: Endocrinology

## 2023-02-08 ENCOUNTER — Ambulatory Visit (INDEPENDENT_AMBULATORY_CARE_PROVIDER_SITE_OTHER): Payer: PPO | Admitting: Endocrinology

## 2023-02-08 VITALS — BP 120/80 | HR 75 | Ht <= 58 in | Wt 130.0 lb

## 2023-02-08 DIAGNOSIS — E1165 Type 2 diabetes mellitus with hyperglycemia: Secondary | ICD-10-CM

## 2023-02-08 DIAGNOSIS — Z794 Long term (current) use of insulin: Secondary | ICD-10-CM | POA: Diagnosis not present

## 2023-02-08 DIAGNOSIS — R600 Localized edema: Secondary | ICD-10-CM | POA: Diagnosis not present

## 2023-02-08 DIAGNOSIS — I10 Essential (primary) hypertension: Secondary | ICD-10-CM

## 2023-02-08 LAB — POCT GLYCOSYLATED HEMOGLOBIN (HGB A1C): Hemoglobin A1C: 7.6 % — AB (ref 4.0–5.6)

## 2023-02-08 NOTE — Patient Instructions (Addendum)
Tresiba 10 units  Supper dose 2-4 units based on carbs and sugars  Take Humalog 5-10 min before meals

## 2023-02-08 NOTE — Progress Notes (Signed)
Patient ID: Jennifer Bailey, female   DOB: 28-Jun-1935, 88 y.o.   MRN: [id number]    Chief complaint: follow up    History of Present Illness     Type 2 DIABETES MELITUS, date of diagnosis: 2003  She has been on insulin for several years but continues to have labile blood sugars despite using basal bolus regimen  She is very compliant with her insulin at mealtimes  Is taking small amounts of insulin and is not on any oral hypoglycemics She was changed from Levemir to [place] insulin in 1/17 because of relatively inconsistent control   RECENT history:   Insulin regimen: Tresiba 11 units in a.m.;  mealtime NovoLog coverage: At breakfast: 3-4 units , 3-5 before lunch and 3-5 at supper                                  Current diabetes management and problems identified:  Daily management, problems identified and abnormal patterns are discussed in detail in the CGM analysis   Her [name] is 7.6  Although her [name] is higher than before recently her time in range is somewhat better However she has more blood sugars below 70 now  Her daughter came in today and is concerned that she is not able to keep up with her insulin well and may forget at times Sometimes she will take her mealtime insulin 15 minutes after eating when she remembers Also she forgot to change her Tresiba to 10 units instead of 11 As before blood sugars are quite labile with intermittent periods of hyperglycemia along with hypoglycemia Hypoglycemia is mostly occurring during the night but some of this may be related to overcorrection of high readings late in the evenings or early morning Occasionally blood sugars are low right before dinner also She has hyperglycemic episodes with her meals at times either from late or inadequate insulin However sometimes her insulin mealtime dose may be appearing to be excessive Her portions are relatively small If her sugars are high in between meals she is still will take 3  to 4 units of correction doses which may be excessive Treatment of low sugars is with glucose tablets or gel She is doing home video exercises in the morning  Her weight is slightly higher  Download of the libre 2 sensor as follows  Blood sugars show significant variability with GV 48 In the last week hyperglycemia is seen periodically after breakfast and rarely after dinner or late evening but in the first week her blood sugars are sporadically higher all day or in the afternoons HYPOGLYCEMIA has been seen inconsistently overnight but occasionally transiently mildly decreased below 70 around mid afternoon or evenings Overnight blood sugars are quite variable with periods of hypoglycemia and hyperglycemia but averaging about 140+ No consistent postprandial higher sugars but as above she will sometimes have higher readings after one of her meals especially breakfast or dinner Postprandial readings are generally lower after dinner compared to Premeal readings  CGM use % of time 97  2-week average/GV   Time in range  67      %  % Time Above 180 18+10  % Time above 250   % Time Below 70 5     PRE-MEAL Fasting Lunch Dinner Bedtime Overall  Glucose range:       Averages: 149 129 183 138    POST-MEAL PC Breakfast PC Lunch PC Dinner  Glucose range:     Averages: 181 142 140   Previously:  CGM use % of time 86  2-week average/GV 167/44  Time in range        61%, was 75  % Time Above 180 21  % Time above 250 16  % Time Below 70 2     PRE-MEAL Fasting Lunch Dinner Bedtime Overall  Glucose range:       Averages: 141   164    POST-MEAL PC Breakfast PC Lunch PC Dinner  Glucose range:     Averages: 195 176 209    Symptoms with hypoglycemia: feels weak and shaky Low sugars are treated with Soft drink or glucose tablets      Meals: 3 meals per day.  Usually has small portions. Eggs/bread or one pancake with protein at -7 am; has mid morning snack.  Has hs snack if blood sugar not  high Evening meal usually balanced at 5 pm with 1-2 carbohydrates  Physical activity: exercise: Some walking and yardwork         Dietician visit: Most recent: 2004            Wt Readings from Last 3 Encounters:  02/08/23 130 lb (59 kg)  12/25/22 127 lb 3.2 oz (57.7 kg)  11/23/22 126 lb (57.2 kg)   Complications: are: Mild neuropathy controlled symptomatically  Lab Results  Component Value Date   HGBA1C 7.6 (A) 02/08/2023   HGBA1C 7.1 (A) 10/06/2022   HGBA1C 7.4 (A) 05/18/2022   Lab Results  Component Value Date   MICROALBUR 1.8 05/18/2022   LDLCALC 38 05/18/2022   CREATININE 0.76 10/06/2022    HYPERTENSION:  this has been mild and treated with 50 mg losartan.   Periodically checks at home also  BP Readings from Last 3 Encounters:  02/08/23 120/80  12/25/22 126/64  11/23/22 127/69    OSTEOPENIA:  She has had osteopenia with T score -2.3 at the hip in 3/09 and was started on Fosamax at that time. This was stopped in 02/2014.   Alendronate weekly was restarted in 08/2021 since bone density was slightly lower  Last bone density in 11/22 compared to 2016: DualFemur Neck Right 10/03/2015    T score -2.1 compared to   -1.9 T score is -2.4 at the radius  She had a traumatic fracture of her tibial plateau on 12/02/2020  History of vitamin D Deficiency, she is taking 50,000 units monthly with adequate levels  Lab Results  Component Value Date   VD25OH 60.18 12/05/2021   VD25OH 53.02 08/25/2021    OTHER active problems: See review of systems   LABS:    Office Visit on 02/08/2023  Component Date Value Ref Range Status   Hemoglobin [name] 02/08/2023 7.6 (A)  4.0 - 5.6 % Final    Allergies as of 02/08/2023       Reactions   Macrodantin [nitrofurantoin Macrocrystal] Swelling   Prednisone    Make real "shaky-like, nervious"   Penicillins Rash   Has patient had a PCN reaction causing immediate rash, facial/tongue/throat swelling, SOB or lightheadedness with  hypotension:Yes Has patient had a PCN reaction causing severe rash involving mucus membranes or skin necrosis: No Has patient had a PCN reaction that required hospitalization: No Has patient had a PCN reaction occurring within the last 10 years:No If all of the above answers are "NO", then may proceed with Cephalosporin use.        Medication List  Accurate as of Feb 08, 2023  1:02 PM. If you have any questions, ask your nurse or doctor.          Alcohol Pads 70 % Pads USE 4 PER DAY   alendronate 70 MG tablet Commonly known as: FOSAMAX TAKE 1 TABLET BY MOUTH EVERY 7 DAYS. TAKE WITH A FULL GLASS OF WATER ON AN EMPTY STOMACH.   Alphagan P 0.1 % Soln Generic drug: brimonidine Place 1 drop into the left eye 2 (two) times daily.   ammonium lactate 12 % cream Commonly known as: AMLACTIN Apply 1 Application topically as needed for dry skin.   aspirin 81 MG tablet Take 1 tablet (81 mg total) by mouth daily.   BD Pen Needle Nano U/F 32G X 4 MM Misc Generic drug: Insulin Pen Needle USE AS DIRECTED FIVE TIMES A DAY.   cycloSPORINE 0.05 % ophthalmic emulsion Commonly known as: RESTASIS 1 drop 2 (two) times daily.   dorzolamidel-timolol 22.3-6.8 MG/ML Soln ophthalmic solution Commonly known as: COSOPT Place 1 drop into the left eye 2 (two) times daily.   FreeStyle Libre 14 Day Reader Hardie Pulley USE DAILY TO CHECK BLOOD SUGAR   FreeStyle Libre 2 Sensor Misc APPLY TO ARM EVERY 14 DAYS TO MONITOR BLOOD SUGAR.   furosemide 20 MG tablet Commonly known as: LASIX Take 1 tablet by mouth every other day.   gabapentin 100 MG capsule Commonly known as: NEURONTIN Take 4 capsules (400 mg total) by mouth at bedtime.   insulin lispro 100 UNIT/ML KwikPen Commonly known as: HUMALOG INJECT 3 TO 5 UNITS SUBCUTANEOUSLY BEFORE MEALS AS DIRECTED.   levothyroxine 88 MCG tablet Commonly known as: SYNTHROID TAKE 1 TABLET (88 MCG TOTAL) BY MOUTH DAILY.   losartan 50 MG tablet Commonly  known as: COZAAR TAKE 1 TABLET BY MOUTH DAILY.   lovastatin 40 MG tablet Commonly known as: MEVACOR TAKE 1 TABLET BY MOUTH ONCE DAILY WITH A MEAL.   Omega-3 1000 MG Caps Take 1 capsule by mouth 4 (four) times daily.   OneTouch Verio test strip Generic drug: glucose blood USE TO TEST BLOOD SUGAR 4 TIMES DAILY   OneTouch Ultra test strip Generic drug: glucose blood Use to check blood sugar once a day if needed   pantoprazole 40 MG tablet Commonly known as: PROTONIX Take 40 mg by mouth 2 (two) times daily as needed (indigestion).   potassium chloride SA 20 MEQ tablet Commonly known as: KLOR-CON M TAKE 1 TABLET BY MOUTH DAILY AS NEEDED. TAKE WITH WATER PILL- LASIX.   PreserVision AREDS 2 Caps Take 1 capsule by mouth 2 (two) times daily.   senna-docusate 8.6-50 MG tablet Commonly known as: Senokot-S Take 1 tablet by mouth 2 (two) times daily. While on narcotics   Tresiba FlexTouch 100 UNIT/ML FlexTouch Pen Generic drug: insulin degludec INJECT 0.11 MLS (11 UNITS TOTAL) INTO THE SKIN DAILY. What changed: See the new instructions.   vitamin C 100 MG tablet Take 500 mg by mouth daily.   Vitamin D (Ergocalciferol) 1.25 MG (50000 UNIT) Caps capsule Commonly known as: DRISDOL TAKE 1 CAPSULE BY MOUTH ONCE A MONTH.        Allergies:  Allergies  Allergen Reactions   Macrodantin [Nitrofurantoin Macrocrystal] Swelling   Prednisone     Make real "shaky-like, nervious"   Penicillins Rash    Has patient had a PCN reaction causing immediate rash, facial/tongue/throat swelling, SOB or lightheadedness with hypotension:Yes Has patient had a PCN reaction causing severe rash involving mucus membranes or  skin necrosis: No Has patient had a PCN reaction that required hospitalization: No Has patient had a PCN reaction occurring within the last 10 years:No If all of the above answers are "NO", then may proceed with Cephalosporin use.     Past Medical History:  Diagnosis Date    Diabetes mellitus without complication (HCC)    Thyroid disease     Past Surgical History:  Procedure Laterality Date   ABDOMINAL HYSTERECTOMY     CHOLECYSTECTOMY     COLON SURGERY     ORIF TIBIA PLATEAU Right 12/04/2020   Procedure: OPEN REDUCTION INTERNAL FIXATION (ORIF) TIBIAL PLATEAU;  Surgeon: Roby Lofts, MD;  Location: [facility] OR;  Service: Orthopedics;  Laterality: Right;    Family History  Problem Relation Age of Onset   Cancer Mother    Diabetes Brother     Social History:  reports that she has never smoked. She has never used smokeless tobacco. She reports that she does not drink alcohol and does not use drugs.   Review of Systems   Peripheral NEUROPATHY with sensory symptoms: Treated with 300-400 mg gabapentin long-term, takes this at bedtime with relief  HYPOTHYROIDISM: She has had primary hypothyroidism, long-standing  No symptoms of fatigue No change in her chronic cold intolerance  Her TSH has been consistently normal, taking 88 micrograms levothyroxine daily  Lab Results  Component Value Date   TSH 1.62 10/06/2022   TSH 0.86 05/18/2022   TSH 1.83 02/09/2022   FREET4 1.33 10/06/2022   FREET4 1.08 02/09/2022   FREET4 0.90 12/05/2021       HYPERLIPIDEMIA: The lipid abnormality consists of elevated LDL,  taking lovastatin with consistently good control, last level below 70.  Lab Results  Component Value Date   CHOL 121 05/18/2022   HDL 45.90 05/18/2022   LDLCALC 38 05/18/2022   TRIG 187.0 (H) 05/18/2022   CHOLHDL 3 05/18/2022   She is asking about her leg edema, more on the right side after her leg surgery and some on the left Has not taking Lasix prescribed by PCP and has difficulty putting on any support stockings   Examination:   BP 120/80 (BP Location: Left Arm, Patient Position: Sitting, Cuff Size: Large)   Pulse 75   Ht 4\' 10"  (1.473 m)   Wt 130 lb (59 kg)   SpO2 96%   BMI 27.17 kg/m   Body mass index is 27.17 kg/m.     2+ edema on  the left lower leg and 3+ on the right   ASSESSMENT/ PLAN:    Diabetes type 2 on insulin   See history of present illness for detailed discussion of current management, blood sugar patterns and problems identified  Her [name] is 7.6  She is on basal bolus insulin with relatively low insulin doses  Her blood sugars are still quite labile although considering her age is reasonably good with time in range 67% Currently getting 5% of readings below 70 which is excessive Some of her high sugars may be related to timing of her insulin being late or missed insulin doses and occasionally late administration Low sugars are partly related to overcorrection, latest insulin and some because of excessive basal She did not reduce Tresiba by directed on the last visit Also not able to adjust her insulin based on what she is eating   Discussed her day-to-day management, factors causing high and low glucose and insulin adjustment, her daughter was also present in the discussion  Recommendations:  Low-fat  meals at breakfast Only take 1 to 2 units of extra insulin for high sugars even when she is not eating Evaristo Bury to be reduced to 10 units Make sure she takes her insulin before starting to eat and at least 5 or 10 minutes before, if forgetting to take it after 15 minutes she will skip the dose Call if having unusually high or low readings  Hypertension:  Her blood pressure is consistently well controlled on losartan 50 mg  Hypothyroidism : Consistently replaced and will check again on the next visit  For her edema she will take Lasix as prescribed by PCP and follow-up with her, also encouraged propping her feet up when she is sitting and if possible use elastic stockings  Total visit time including counseling = 30 minutes  There are no Patient Instructions on file for this visit.   Reather Littler 02/08/2023, 1:02 PM   Addendum: Labs stable

## 2023-02-09 ENCOUNTER — Encounter: Payer: Self-pay | Admitting: Endocrinology

## 2023-02-16 ENCOUNTER — Ambulatory Visit: Payer: HMO | Admitting: Podiatry

## 2023-02-16 ENCOUNTER — Ambulatory Visit (INDEPENDENT_AMBULATORY_CARE_PROVIDER_SITE_OTHER): Payer: HMO

## 2023-02-16 VITALS — Ht 59.0 in | Wt 120.0 lb

## 2023-02-16 DIAGNOSIS — Z0000 Encounter for general adult medical examination without abnormal findings: Secondary | ICD-10-CM

## 2023-02-16 NOTE — Patient Instructions (Signed)
Jennifer Bailey , Thank you for taking time to come for your Medicare Wellness Visit. I appreciate your ongoing commitment to your health goals. Please review the following plan we discussed and let me know if I can assist you in the future.   These are the goals we discussed:  Goals      Patient Stated     02/16/2023, no goals        This is a list of the screening recommended for you and due dates:  Health Maintenance  Topic Date Due   DTaP/Tdap/Td vaccine (1 - Tdap) Never done   COVID-19 Vaccine (4 - 2023-24 season) 06/05/2022   Eye exam for diabetics  06/30/2022   Flu Shot  05/06/2023   Yearly kidney health urinalysis for diabetes  05/19/2023   Hemoglobin [name]  08/11/2023   Yearly kidney function blood test for diabetes  10/07/2023   Complete foot exam   12/25/2023   Medicare Annual Wellness Visit  02/16/2024   Pneumonia Vaccine  Completed   DEXA scan (bone density measurement)  Completed   Zoster (Shingles) Vaccine  Completed   HPV Vaccine  Aged Out    Advanced directives: copy in chart  Conditions/risks identified: none  Next appointment: Follow up in one year for your annual wellness visit    Preventive Care 65 Years and Older, Female Preventive care refers to lifestyle choices and visits with your health care provider that can promote health and wellness. What does preventive care include? A yearly physical exam. This is also called an annual well check. Dental exams once or twice a year. Routine eye exams. Ask your health care provider how often you should have your eyes checked. Personal lifestyle choices, including: Daily care of your teeth and gums. Regular physical activity. Eating a healthy diet. Avoiding tobacco and drug use. Limiting alcohol use. Practicing safe sex. Taking low-dose aspirin every day. Taking vitamin and mineral supplements as recommended by your health care provider. What happens during an annual well check? The services and screenings  done by your health care provider during your annual well check will depend on your age, overall health, lifestyle risk factors, and family history of disease. Counseling  Your health care provider may ask you questions about your: Alcohol use. Tobacco use. Drug use. Emotional well-being. Home and relationship well-being. Sexual activity. Eating habits. History of falls. Memory and ability to understand (cognition). Work and work Astronomer. Reproductive health. Screening  You may have the following tests or measurements: Height, weight, and BMI. Blood pressure. Lipid and cholesterol levels. These may be checked every 5 years, or more frequently if you are over 17 years old. Skin check. Lung cancer screening. You may have this screening every year starting at age 94 if you have a 30-pack-year history of smoking and currently smoke or have quit within the past 15 years. Fecal occult blood test (FOBT) of the stool. You may have this test every year starting at age 40. Flexible sigmoidoscopy or colonoscopy. You may have a sigmoidoscopy every 5 years or a colonoscopy every 10 years starting at age 10. Hepatitis C blood test. Hepatitis B blood test. Sexually transmitted disease (STD) testing. Diabetes screening. This is done by checking your blood sugar (glucose) after you have not eaten for a while (fasting). You may have this done every 1-3 years. Bone density scan. This is done to screen for osteoporosis. You may have this done starting at age 50. Mammogram. This may be done every 1-2 years.  Talk to your health care provider about how often you should have regular mammograms. Talk with your health care provider about your test results, treatment options, and if necessary, the need for more tests. Vaccines  Your health care provider may recommend certain vaccines, such as: Influenza vaccine. This is recommended every year. Tetanus, diphtheria, and acellular pertussis (Tdap, Td)  vaccine. You may need a Td booster every 10 years. Zoster vaccine. You may need this after age 19. Pneumococcal 13-valent conjugate (PCV13) vaccine. One dose is recommended after age 48. Pneumococcal polysaccharide (PPSV23) vaccine. One dose is recommended after age 13. Talk to your health care provider about which screenings and vaccines you need and how often you need them. This information is not intended to replace advice given to you by your health care provider. Make sure you discuss any questions you have with your health care provider. Document Released: 10/18/2015 Document Revised: 06/10/2016 Document Reviewed: 07/23/2015 Elsevier Interactive Patient Education  2017 [place] Prevention in the Home Falls can cause injuries. They can happen to people of all ages. There are many things you can do to make your home safe and to help prevent falls. What can I do on the outside of my home? Regularly fix the edges of walkways and driveways and fix any cracks. Remove anything that might make you trip as you walk through a door, such as a raised step or threshold. Trim any bushes or trees on the path to your home. Use bright outdoor lighting. Clear any walking paths of anything that might make someone trip, such as rocks or tools. Regularly check to see if handrails are loose or broken. Make sure that both sides of any steps have handrails. Any raised decks and porches should have guardrails on the edges. Have any leaves, snow, or ice cleared regularly. Use sand or salt on walking paths during winter. Clean up any spills in your garage right away. This includes oil or grease spills. What can I do in the bathroom? Use night lights. Install grab bars by the toilet and in the tub and shower. Do not use towel bars as grab bars. Use non-skid mats or decals in the tub or shower. If you need to sit down in the shower, use a plastic, non-slip stool. Keep the floor dry. Clean up any  water that spills on the floor as soon as it happens. Remove soap buildup in the tub or shower regularly. Attach bath mats securely with double-sided non-slip rug tape. Do not have throw rugs and other things on the floor that can make you trip. What can I do in the bedroom? Use night lights. Make sure that you have a light by your bed that is easy to reach. Do not use any sheets or blankets that are too big for your bed. They should not hang down onto the floor. Have a firm chair that has side arms. You can use this for support while you get dressed. Do not have throw rugs and other things on the floor that can make you trip. What can I do in the kitchen? Clean up any spills right away. Avoid walking on wet floors. Keep items that you use a lot in easy-to-reach places. If you need to reach something above you, use a strong step stool that has a grab bar. Keep electrical cords out of the way. Do not use floor polish or wax that makes floors slippery. If you must use wax, use non-skid floor wax.  Do not have throw rugs and other things on the floor that can make you trip. What can I do with my stairs? Do not leave any items on the stairs. Make sure that there are handrails on both sides of the stairs and use them. Fix handrails that are broken or loose. Make sure that handrails are as long as the stairways. Check any carpeting to make sure that it is firmly attached to the stairs. Fix any carpet that is loose or worn. Avoid having throw rugs at the top or bottom of the stairs. If you do have throw rugs, attach them to the floor with carpet tape. Make sure that you have a light switch at the top of the stairs and the bottom of the stairs. If you do not have them, ask someone to add them for you. What else can I do to help prevent falls? Wear shoes that: Do not have high heels. Have rubber bottoms. Are comfortable and fit you well. Are closed at the toe. Do not wear sandals. If you use a  stepladder: Make sure that it is fully opened. Do not climb a closed stepladder. Make sure that both sides of the stepladder are locked into place. Ask someone to hold it for you, if possible. Clearly mark and make sure that you can see: Any grab bars or handrails. First and last steps. Where the edge of each step is. Use tools that help you move around (mobility aids) if they are needed. These include: Canes. Walkers. Scooters. Crutches. Turn on the lights when you go into a dark area. Replace any light bulbs as soon as they burn out. Set up your furniture so you have a clear path. Avoid moving your furniture around. If any of your floors are uneven, fix them. If there are any pets around you, be aware of where they are. Review your medicines with your doctor. Some medicines can make you feel dizzy. This can increase your chance of falling. Ask your doctor what other things that you can do to help prevent falls. This information is not intended to replace advice given to you by your health care provider. Make sure you discuss any questions you have with your health care provider. Document Released: 07/18/2009 Document Revised: 02/27/2016 Document Reviewed: 10/26/2014 Elsevier Interactive Patient Education  2017 [employer].

## 2023-02-16 NOTE — Progress Notes (Signed)
I connected with  Emilie Rutter on 02/16/23 by a audio enabled telemedicine application and verified that I am speaking with the correct person using two identifiers.  Patient Location: Home  Provider Location: Office/Clinic  I discussed the limitations of evaluation and management by telemedicine. The patient expressed understanding and agreed to proceed.  Subjective:   Jennifer Bailey is a 88 y.o. female who presents for Medicare Annual (Subsequent) preventive examination.  Review of Systems     Cardiac Risk Factors include: advanced age (>49men, >76 women);diabetes mellitus;hypertension     Objective:    Today's Vitals   02/16/23 0912  Weight: 120 lb (54.4 kg)  Height: 4\' 11"  (1.499 m)   Body mass index is 24.24 kg/m.     02/16/2023    9:18 AM 12/02/2020   12:40 PM 12/02/2020   10:05 AM 07/09/2020   10:30 AM 10/30/2019    2:34 PM 07/06/2019    2:30 PM 01/27/2019    3:08 PM  Advanced Directives  Does Patient Have a Medical Advance Directive? Yes No No Yes Yes Yes Yes  Type of Estate agent of [place];Living will   Healthcare Power of [place];Living will Healthcare Power of [place];Living will  Healthcare Power of Attorney  Does patient want to make changes to medical advance directive?    No - Patient declined No - Patient declined  No - Patient declined  Copy of Healthcare Power of Attorney in Chart? Yes - validated most recent copy scanned in chart (See row information)    No - copy requested    Would patient like information on creating a medical advance directive?  No - Patient declined         Current Medications (verified) Outpatient Encounter Medications as of 02/16/2023  Medication Sig   Alcohol Swabs (ALCOHOL PADS) 70 % PADS USE 4 PER DAY   alendronate (FOSAMAX) 70 MG tablet TAKE 1 TABLET BY MOUTH EVERY 7 DAYS. TAKE WITH A FULL GLASS OF WATER ON AN EMPTY STOMACH.   ammonium lactate (AMLACTIN) 12 % cream Apply 1 Application topically  as needed for dry skin.   Ascorbic Acid (VITAMIN C) 100 MG tablet Take 500 mg by mouth daily.    aspirin 81 MG tablet Take 1 tablet (81 mg total) by mouth daily.   BD PEN NEEDLE NANO U/F 32G X 4 MM MISC USE AS DIRECTED FIVE TIMES A DAY.   brimonidine (ALPHAGAN P) 0.1 % SOLN Place 1 drop into the left eye 2 (two) times daily.   Continuous Blood Gluc Receiver (FREESTYLE LIBRE 14 DAY READER) DEVI USE DAILY TO CHECK BLOOD SUGAR   Continuous Blood Gluc Sensor (FREESTYLE LIBRE 2 SENSOR) MISC APPLY TO ARM EVERY 14 DAYS TO MONITOR BLOOD SUGAR.   cycloSPORINE (RESTASIS) 0.05 % ophthalmic emulsion 1 drop 2 (two) times daily.   dorzolamidel-timolol (COSOPT) 22.3-6.8 MG/ML SOLN ophthalmic solution Place 1 drop into the left eye 2 (two) times daily.   furosemide (LASIX) 20 MG tablet Take 1 tablet by mouth every other day.   gabapentin (NEURONTIN) 100 MG capsule Take 4 capsules (400 mg total) by mouth at bedtime.   glucose blood (ONETOUCH ULTRA) test strip Use to check blood sugar once a day if needed   insulin lispro (HUMALOG) 100 UNIT/ML KwikPen INJECT 3 TO 5 UNITS SUBCUTANEOUSLY BEFORE MEALS AS DIRECTED.   levothyroxine (SYNTHROID) 88 MCG tablet TAKE 1 TABLET (88 MCG TOTAL) BY MOUTH DAILY.   losartan (COZAAR) 50 MG tablet TAKE 1  TABLET BY MOUTH DAILY.   lovastatin (MEVACOR) 40 MG tablet TAKE 1 TABLET BY MOUTH ONCE DAILY WITH A MEAL.   Multiple Vitamins-Minerals (PRESERVISION AREDS 2) CAPS Take 1 capsule by mouth 2 (two) times daily.   Omega-3 1000 MG CAPS Take 1 capsule by mouth 4 (four) times daily.   ONETOUCH VERIO test strip USE TO TEST BLOOD SUGAR 4 TIMES DAILY   pantoprazole (PROTONIX) 40 MG tablet Take 40 mg by mouth 2 (two) times daily as needed (indigestion).   potassium chloride SA (KLOR-CON M) 20 MEQ tablet TAKE 1 TABLET BY MOUTH DAILY AS NEEDED. TAKE WITH WATER PILL- LASIX.   senna-docusate (SENOKOT-S) 8.6-50 MG tablet Take 1 tablet by mouth 2 (two) times daily. While on narcotics   TRESIBA  FLEXTOUCH 100 UNIT/ML FlexTouch Pen INJECT 0.11 MLS (11 UNITS TOTAL) INTO THE SKIN DAILY. (Patient taking differently: 10 Units.)   Vitamin D, Ergocalciferol, (DRISDOL) 1.25 MG (50000 UNIT) CAPS capsule TAKE 1 CAPSULE BY MOUTH ONCE A MONTH.   No facility-administered encounter medications on file as of 02/16/2023.    Allergies (verified) Macrodantin [nitrofurantoin macrocrystal], Prednisone, and Penicillins   History: Past Medical History:  Diagnosis Date   Diabetes mellitus without complication (HCC)    Thyroid disease    Past Surgical History:  Procedure Laterality Date   ABDOMINAL HYSTERECTOMY     CHOLECYSTECTOMY     COLON SURGERY     ORIF TIBIA PLATEAU Right 12/04/2020   Procedure: OPEN REDUCTION INTERNAL FIXATION (ORIF) TIBIAL PLATEAU;  Surgeon: Roby Lofts, MD;  Location: [facility] OR;  Service: Orthopedics;  Laterality: Right;   Family History  Problem Relation Age of Onset   Cancer Mother    Diabetes Brother    Social History   Socioeconomic History   Marital status: Widowed    Spouse name: Not on file   Number of children: Not on file   Years of education: Not on file   Highest education level: Not on file  Occupational History   Not on file  Tobacco Use   Smoking status: Never   Smokeless tobacco: Never  Vaping Use   Vaping Use: Never used  Substance and Sexual Activity   Alcohol use: Never   Drug use: Never   Sexual activity: Not on file  Other Topics Concern   Not on file  Social History Narrative   Not on file   Social Determinants of Health   Financial Resource Strain: Low Risk  (02/16/2023)   Overall Financial Resource Strain (CARDIA)    Difficulty of Paying Living Expenses: Not hard at all  Food Insecurity: No Food Insecurity (02/16/2023)   Hunger Vital Sign    Worried About Running Out of Food in the Last Year: Never true    Ran Out of Food in the Last Year: Never true  Transportation Needs: No Transportation Needs (02/16/2023)   PRAPARE -  Administrator, Civil Service (Medical): No    Lack of Transportation (Non-Medical): No  Physical Activity: Sufficiently Active (02/16/2023)   Exercise Vital Sign    Days of Exercise per Week: 5 days    Minutes of Exercise per Session: 30 min  Stress: No Stress Concern Present (02/16/2023)   [employer] of Occupational Health - Occupational Stress Questionnaire    Feeling of Stress : Not at all  Social Connections: Not on file    Tobacco Counseling Counseling given: Not Answered   Clinical Intake:  Pre-visit preparation completed: Yes  Pain : No/denies  pain     Nutritional Status: BMI of 19-24  Normal Nutritional Risks: None Diabetes: Yes  How often do you need to have someone help you when you read instructions, pamphlets, or other written materials from your doctor or pharmacy?: 1 - Never  Diabetic? Yes Nutrition Risk Assessment:  Has the patient had any N/V/D within the last 2 months?  No  Does the patient have any non-healing wounds?  No  Has the patient had any unintentional weight loss or weight gain?  No   Diabetes:  Is the patient diabetic?  Yes  If diabetic, was a CBG obtained today?  No  Did the patient bring in their glucometer from home?  No  How often do you monitor your CBG's? Has a freestyle.   Financial Strains and Diabetes Management:  Are you having any financial strains with the device, your supplies or your medication? No .  Does the patient want to be seen by Chronic Care Management for management of their diabetes?  No  Would the patient like to be referred to a Nutritionist or for Diabetic Management?  No   Diabetic Exams:  Diabetic Eye Exam: Overdue for diabetic eye exam. Pt has been advised about the importance in completing this exam. Patient advised to call and schedule an eye exam. Diabetic Foot Exam: Completed 12/25/2022   Interpreter Needed?: No  Information entered by :: NAllen LPN   Activities of Daily  Living    02/16/2023    9:20 AM 11/23/2022   10:03 AM  In your present state of health, do you have any difficulty performing the following activities:  Hearing? 0 1  Vision? 0 0  Difficulty concentrating or making decisions? 1 1  Walking or climbing stairs? 1 1  Dressing or bathing? 0 0  Doing errands, shopping? 1 1  Comment does not drive   Preparing Food and eating ? N   Using the Toilet? N   In the past six months, have you accidently leaked urine? N   Do you have problems with loss of bowel control? N   Managing your Medications? Y   Comment children sets up   Managing your Finances? N   Housekeeping or managing your Housekeeping? N     Patient Care Team: Early, Sung Amabile, NP as PCP - General (Nurse Practitioner) Reather Littler, MD as Consulting Physician (Endocrinology)  Indicate any recent Medical Services you may have received from other than [facility] providers in the past year (date may be approximate).     Assessment:   This is a routine wellness examination for [place].  Hearing/Vision screen Vision Screening - Comments:: Regular eye exams, [facility]  Dietary issues and exercise activities discussed: Current Exercise Habits: Home exercise routine, Type of exercise: calisthenics, Time (Minutes): 30, Frequency (Times/Week): 5, Weekly Exercise (Minutes/Week): 150   Goals Addressed             This Visit's Progress    Patient Stated       02/16/2023, no goals       Depression Screen    02/16/2023    9:19 AM 12/25/2022   10:17 AM 11/23/2022   10:01 AM 01/27/2022    3:23 PM 10/01/2021    2:11 PM 05/12/2021    9:43 AM 04/22/2020    3:08 PM  PHQ 2/9 Scores  PHQ - 2 Score 0 1 2 1  0 3 3  PHQ- 9 Score 0 4 6 4 2 5  5  Fall Risk    02/16/2023    9:19 AM 12/25/2022   10:16 AM 11/23/2022   10:03 AM 01/27/2022    2:51 PM 10/01/2021    2:10 PM  Fall Risk   Falls in the past year? 0 0 1 1 1   Number falls in past yr: 0 0 0 0 0  Injury with Fall? 0 0 0 1 1  Risk  for fall due to : Medication side effect No Fall Risks  History of fall(s) History of fall(s);Impaired balance/gait;Impaired mobility  Follow up Falls prevention discussed;Education provided;Falls evaluation completed Falls evaluation completed Falls evaluation completed Falls evaluation completed Falls evaluation completed    FALL RISK PREVENTION PERTAINING TO THE HOME:  Any stairs in or around the home? Yes  If so, are there any without handrails? No  Home free of loose throw rugs in walkways, pet beds, electrical cords, etc? Yes  Adequate lighting in your home to reduce risk of falls? Yes   ASSISTIVE DEVICES UTILIZED TO PREVENT FALLS:  Life alert? No  Use of a cane, walker or w/c? No  Grab bars in the bathroom? Yes  Shower chair or bench in shower? Yes  Elevated toilet seat or a handicapped toilet? Yes   TIMED UP AND GO:  Was the test performed? No .      Cognitive Function:        02/16/2023    9:22 AM 10/01/2021    1:54 PM  6CIT Screen  What Year? 4 points 0 points  What month? 0 points 0 points  What time? 3 points 0 points  Count back from 20 2 points 0 points  Months in reverse 0 points 4 points  Repeat phrase 6 points 2 points  Total Score 15 points 6 points    Immunizations Immunization History  Administered Date(s) Administered   Fluad Quad(high Dose 65+) 07/06/2019   Influenza, High Dose Seasonal PF 06/29/2016, 07/26/2017, 07/18/2018   Influenza,inj,Quad PF,6+ Mos 07/11/2015   Influenza-Unspecified 06/19/2013, 07/13/2014, 07/25/2020, 07/05/2022   PFIZER(Purple Top)SARS-COV-2 Vaccination 11/17/2019, 12/10/2019, 07/25/2020   Pneumococcal Conjugate-13 02/14/2014   Pneumococcal Polysaccharide-23 09/08/2004, 10/25/2017   Zoster Recombinat (Shingrix) 04/22/2020, 07/08/2020    TDAP status: Due, Education has been provided regarding the importance of this vaccine. Advised may receive this vaccine at local pharmacy or Health Dept. Aware to provide a copy of  the vaccination record if obtained from local pharmacy or Health Dept. Verbalized acceptance and understanding.  Flu Vaccine status: Up to date  Pneumococcal vaccine status: Up to date  Covid-19 vaccine status: Completed vaccines  Qualifies for Shingles Vaccine? Yes   Zostavax completed Yes   Shingrix Completed?: Yes  Screening Tests Health Maintenance  Topic Date Due   DTaP/Tdap/Td (1 - Tdap) Never done   COVID-19 Vaccine (4 - 2023-24 season) 06/05/2022   OPHTHALMOLOGY EXAM  06/30/2022   Medicare Annual Wellness (AWV)  10/01/2022   INFLUENZA VACCINE  05/06/2023   Diabetic kidney evaluation - Urine ACR  05/19/2023   HEMOGLOBIN [name]  08/11/2023   Diabetic kidney evaluation - eGFR measurement  10/07/2023   FOOT EXAM  12/25/2023   Pneumonia Vaccine 65+ Years old  Completed   DEXA SCAN  Completed   Zoster Vaccines- Shingrix  Completed   HPV VACCINES  Aged Out    Health Maintenance  Health Maintenance Due  Topic Date Due   DTaP/Tdap/Td (1 - Tdap) Never done   COVID-19 Vaccine (4 - 2023-24 season) 06/05/2022   OPHTHALMOLOGY EXAM  06/30/2022  Medicare Annual Wellness (AWV)  10/01/2022    Colorectal cancer screening: No longer required.   Mammogram status: Completed 01/11/2023. Repeat every year  Bone Density status: Completed 08/22/2021.  Lung Cancer Screening: (Low Dose CT Chest recommended if Age 86-80 years, 30 pack-year currently smoking OR have quit w/in 15years.) does not qualify.   Lung Cancer Screening Referral: no  Additional Screening:  Hepatitis C Screening: does not qualify;   Vision Screening: Recommended annual ophthalmology exams for early detection of glaucoma and other disorders of the eye. Is the patient up to date with their annual eye exam?  Yes  Who is the provider or what is the name of the office in which the patient attends annual eye exams? Dr. Dione Booze If pt is not established with a provider, would they like to be referred to a provider to  establish care? No .   Dental Screening: Recommended annual dental exams for proper oral hygiene  Community Resource Referral / Chronic Care Management: CRR required this visit?  No   CCM required this visit?  No      Plan:     I have personally reviewed and noted the following in the patient's chart:   Medical and social history Use of alcohol, tobacco or illicit drugs  Current medications and supplements including opioid prescriptions. Patient is not currently taking opioid prescriptions. Functional ability and status Nutritional status Physical activity Advanced directives List of other physicians Hospitalizations, surgeries, and ER visits in previous 12 months Vitals Screenings to include cognitive, depression, and falls Referrals and appointments  In addition, I have reviewed and discussed with patient certain preventive protocols, quality metrics, and best practice recommendations. A written personalized care plan for preventive services as well as general preventive health recommendations were provided to patient.     Barb Merino, LPN   [id number]/54/[id number]   Nurse Notes: none  Due to this being a virtual visit, the after visit summary with patients personalized plan was offered to patient via mail or my-chart.  Patient would like to access on my-chart

## 2023-03-31 ENCOUNTER — Telehealth: Payer: Self-pay

## 2023-03-31 NOTE — Telephone Encounter (Signed)
Lanora Manis from [facility] called wanting to get verbal orders for PT for the pt. She said there was a glitch in there system and she never got scheduled for this. She can be reached at [phone] let me know if ok and I can call back with the verbal orders.

## 2023-03-31 NOTE — Telephone Encounter (Signed)
Thank you. It is ok to send the orders for PT.

## 2023-04-01 ENCOUNTER — Other Ambulatory Visit: Payer: Self-pay | Admitting: Endocrinology

## 2023-04-05 DIAGNOSIS — G8929 Other chronic pain: Secondary | ICD-10-CM | POA: Diagnosis not present

## 2023-04-05 DIAGNOSIS — M810 Age-related osteoporosis without current pathological fracture: Secondary | ICD-10-CM | POA: Diagnosis not present

## 2023-04-05 DIAGNOSIS — H409 Unspecified glaucoma: Secondary | ICD-10-CM | POA: Diagnosis not present

## 2023-04-05 DIAGNOSIS — H9193 Unspecified hearing loss, bilateral: Secondary | ICD-10-CM | POA: Diagnosis not present

## 2023-04-05 DIAGNOSIS — Z794 Long term (current) use of insulin: Secondary | ICD-10-CM | POA: Diagnosis not present

## 2023-04-05 DIAGNOSIS — E039 Hypothyroidism, unspecified: Secondary | ICD-10-CM | POA: Diagnosis not present

## 2023-04-05 DIAGNOSIS — E1165 Type 2 diabetes mellitus with hyperglycemia: Secondary | ICD-10-CM | POA: Diagnosis not present

## 2023-04-05 DIAGNOSIS — Z9181 History of falling: Secondary | ICD-10-CM | POA: Diagnosis not present

## 2023-04-05 DIAGNOSIS — E079 Disorder of thyroid, unspecified: Secondary | ICD-10-CM | POA: Diagnosis not present

## 2023-04-05 DIAGNOSIS — Z7982 Long term (current) use of aspirin: Secondary | ICD-10-CM | POA: Diagnosis not present

## 2023-04-05 DIAGNOSIS — I10 Essential (primary) hypertension: Secondary | ICD-10-CM | POA: Diagnosis not present

## 2023-04-05 DIAGNOSIS — M545 Low back pain, unspecified: Secondary | ICD-10-CM | POA: Diagnosis not present

## 2023-04-05 DIAGNOSIS — Z602 Problems related to living alone: Secondary | ICD-10-CM | POA: Diagnosis not present

## 2023-04-05 DIAGNOSIS — K219 Gastro-esophageal reflux disease without esophagitis: Secondary | ICD-10-CM | POA: Diagnosis not present

## 2023-04-05 DIAGNOSIS — Z9049 Acquired absence of other specified parts of digestive tract: Secondary | ICD-10-CM | POA: Diagnosis not present

## 2023-04-05 DIAGNOSIS — M1990 Unspecified osteoarthritis, unspecified site: Secondary | ICD-10-CM | POA: Diagnosis not present

## 2023-04-05 DIAGNOSIS — E1140 Type 2 diabetes mellitus with diabetic neuropathy, unspecified: Secondary | ICD-10-CM | POA: Diagnosis not present

## 2023-04-05 DIAGNOSIS — M8580 Other specified disorders of bone density and structure, unspecified site: Secondary | ICD-10-CM | POA: Diagnosis not present

## 2023-04-05 DIAGNOSIS — Z556 Problems related to health literacy: Secondary | ICD-10-CM | POA: Diagnosis not present

## 2023-04-05 DIAGNOSIS — E785 Hyperlipidemia, unspecified: Secondary | ICD-10-CM | POA: Diagnosis not present

## 2023-04-05 DIAGNOSIS — Z90710 Acquired absence of both cervix and uterus: Secondary | ICD-10-CM | POA: Diagnosis not present

## 2023-04-06 ENCOUNTER — Other Ambulatory Visit: Payer: Self-pay | Admitting: Endocrinology

## 2023-05-10 ENCOUNTER — Ambulatory Visit (INDEPENDENT_AMBULATORY_CARE_PROVIDER_SITE_OTHER): Payer: PPO | Admitting: Endocrinology

## 2023-05-10 ENCOUNTER — Encounter: Payer: Self-pay | Admitting: Endocrinology

## 2023-05-10 VITALS — BP 122/80 | HR 74 | Ht 59.0 in | Wt 132.0 lb

## 2023-05-10 DIAGNOSIS — Z794 Long term (current) use of insulin: Secondary | ICD-10-CM

## 2023-05-10 DIAGNOSIS — E063 Autoimmune thyroiditis: Secondary | ICD-10-CM

## 2023-05-10 DIAGNOSIS — E7800 Pure hypercholesterolemia, unspecified: Secondary | ICD-10-CM

## 2023-05-10 DIAGNOSIS — I10 Essential (primary) hypertension: Secondary | ICD-10-CM

## 2023-05-10 DIAGNOSIS — E1165 Type 2 diabetes mellitus with hyperglycemia: Secondary | ICD-10-CM

## 2023-05-10 LAB — MICROALBUMIN / CREATININE URINE RATIO
Creatinine,U: 44.2 mg/dL
Microalb Creat Ratio: 3.4 mg/g (ref 0.0–30.0)
Microalb, Ur: 1.5 mg/dL (ref 0.0–1.9)

## 2023-05-10 LAB — POCT GLYCOSYLATED HEMOGLOBIN (HGB A1C): Hemoglobin A1C: 7.8 % — AB (ref 4.0–5.6)

## 2023-05-10 NOTE — Patient Instructions (Signed)
Go up to 11 units on the [place] and make sure you take it every morning with your breakfast Humalog If your blood sugar is below 100 before given meal you can wait till you finish the meal before taking the insulin Drink more water when your blood sugars are over 250  Use at least support hose on the right leg to keep the swelling down  Let us know when you receive the pump supplies and we can set up the training

## 2023-05-10 NOTE — Progress Notes (Signed)
Patient ID: Jennifer Bailey, female   DOB: Aug 27, 1935, 88 y.o.   MRN: [id number]    Chief complaint: follow up    History of Present Illness     Type 2 DIABETES MELITUS, date of diagnosis: 2003  She has been on insulin for several years but continues to have labile blood sugars despite using basal bolus regimen  She is very compliant with her insulin at mealtimes  Is taking small amounts of insulin and is not on any oral hypoglycemics She was changed from Levemir to [place] insulin in 1/17 because of relatively inconsistent control   RECENT history:   Insulin regimen: Tresiba 10 units in a.m.;  mealtime NovoLog coverage: At breakfast: 3-4 units , 3-5 before lunch and 3-5 at supper                                  Current diabetes management and problems identified:  Daily management, problems identified and abnormal patterns are discussed in detail in the CGM analysis   Her [name] is 7.8 compared to 7.6  Although her [name] is about the same as before her time in range is significantly worse than before She has high variability in her blood sugars as before Difficult to explain periods of hyperglycemia including overnight  However no hypoglycemia compared to the last visit Her daughter says that they tried to remind her to take her insulin on time but not clear if she forgets sometimes She usually takes a set dose of 3 to 4 units for every meal If the blood sugar is low normal she will skip the dose She does take 10 units of Tresiba in the morning but even though she thinks she is taking regularly appears to be possibly missing some doses because of persistently high readings overnight on some nights She is afraid of blood sugars getting low overnight even though she has her sensor and will eat 2 or 3 peanut butter crackers at bedtime She is usually getting a balanced meal at most meals including at breakfast Blood sugars tend to be higher after lunch than any other  meals Her weight is slightly higher  Download of the libre 2 sensor as follows  Blood sugars show significant variability with GV 28, previously 48 Overnight blood sugars are mostly high although somewhat variable and occasionally in the target range but no hypoglycemia, hyperglycemic episodes occur at different times of the night  During the midday the blood sugars showed high variability with periods of hyperglycemia at all different times and occasionally low normal readings  Postprandial readings after breakfast are usually not increasing and usually improve by midmorning  Blood sugars after lunch are sporadically higher but also vary with the Premeal readings Hyperglycemic episodes after dinner are infrequent  No hypoglycemia seen except low normal readings sporadically midmorning or midafternoon  CGM use % of time   2-week average/GV 197/28  Time in range        % 47  % Time Above 180 30  % Time above 250 23  % Time Below 70      PRE-MEAL Fasting Lunch Dinner Bedtime Overall  Glucose range:       Averages: 219  237 191    POST-MEAL PC Breakfast PC Lunch PC Dinner  Glucose range:     Averages: 225 169 208  Previously:   CGM use % of time 97  2-week average/GV  Time in range  67      %  % Time Above 180 18+10  % Time above 250   % Time Below 70 5     PRE-MEAL Fasting Lunch Dinner Bedtime Overall  Glucose range:       Averages: 149 129 183 138    POST-MEAL PC Breakfast PC Lunch PC Dinner  Glucose range:     Averages: 181 142 140    Symptoms with hypoglycemia: feels weak and shaky Low sugars are treated with Soft drink or glucose tablets      Meals: 3 meals per day.  Usually has small portions. Eggs/bread or one pancake with protein at -7 am; has mid morning snack.  Has hs snack if blood sugar not high Evening meal usually balanced at 5 pm with 1-2 carbohydrates  Physical activity: exercise: Some walking and yardwork         Dietician visit: Most recent: 2004             Wt Readings from Last 3 Encounters:  05/10/23 132 lb (59.9 kg)  02/16/23 120 lb (54.4 kg)  02/08/23 130 lb (59 kg)   Complications: are: Mild neuropathy controlled symptomatically  Lab Results  Component Value Date   HGBA1C 7.6 (A) 02/08/2023   HGBA1C 7.1 (A) 10/06/2022   HGBA1C 7.4 (A) 05/18/2022   Lab Results  Component Value Date   MICROALBUR 1.8 05/18/2022   LDLCALC 38 05/18/2022   CREATININE 0.76 10/06/2022    HYPERTENSION:  this has been mild and treated with 50 mg losartan.   Periodically checks at home also  BP Readings from Last 3 Encounters:  05/10/23 122/80  02/08/23 120/80  12/25/22 126/64    OSTEOPENIA:  She has had osteopenia with T score -2.3 at the hip in 3/09 and was started on Fosamax at that time. This was stopped in 02/2014.   Alendronate weekly was restarted in 08/2021 since bone density was slightly lower  Last bone density in 11/22 compared to 2016: DualFemur Neck Right 10/03/2015    T score -2.1 compared to   -1.9 T score is -2.4 at the radius  She had a traumatic fracture of her tibial plateau on 12/02/2020  History of vitamin D Deficiency, she is taking 50,000 units monthly with adequate levels  Lab Results  Component Value Date   VD25OH 60.18 12/05/2021   VD25OH 53.02 08/25/2021    OTHER active problems: See review of systems   LABS:    No visits with results within 1 Week(s) from this visit.  Latest known visit with results is:  Office Visit on 02/08/2023  Component Date Value Ref Range Status   Hemoglobin [name] 02/08/2023 7.6 (A)  4.0 - 5.6 % Final    Allergies as of 05/10/2023       Reactions   Macrodantin [nitrofurantoin Macrocrystal] Swelling   Prednisone    Make real "shaky-like, nervious"   Penicillins Rash   Has patient had a PCN reaction causing immediate rash, facial/tongue/throat swelling, SOB or lightheadedness with hypotension:Yes Has patient had a PCN reaction causing severe rash involving mucus  membranes or skin necrosis: No Has patient had a PCN reaction that required hospitalization: No Has patient had a PCN reaction occurring within the last 10 years:No If all of the above answers are "NO", then may proceed with Cephalosporin use.        Medication List        Accurate as of May 10, 2023  1:05 PM.  If you have any questions, ask your nurse or doctor.          Alcohol Pads 70 % Pads USE 4 PER DAY   alendronate 70 MG tablet Commonly known as: FOSAMAX TAKE 1 TABLET BY MOUTH EVERY 7 DAYS. TAKE WITH A FULL GLASS OF WATER ON AN EMPTY STOMACH.   Alphagan P 0.1 % Soln Generic drug: brimonidine Place 1 drop into the left eye 2 (two) times daily.   ammonium lactate 12 % cream Commonly known as: AMLACTIN Apply 1 Application topically as needed for dry skin.   aspirin 81 MG tablet Take 1 tablet (81 mg total) by mouth daily.   BD Pen Needle Nano U/F 32G X 4 MM Misc Generic drug: Insulin Pen Needle USE AS DIRECTED FIVE TIMES A DAY.   cycloSPORINE 0.05 % ophthalmic emulsion Commonly known as: RESTASIS 1 drop 2 (two) times daily.   dorzolamidel-timolol 22.3-6.8 MG/ML Soln ophthalmic solution Commonly known as: COSOPT Place 1 drop into the left eye 2 (two) times daily.   dorzolamide-timolol 2-0.5 % ophthalmic solution Commonly known as: COSOPT Place 1 drop into both eyes 2 (two) times daily.   FreeStyle Libre 14 Day Reader Hardie Pulley USE DAILY TO CHECK BLOOD SUGAR   FreeStyle Libre 2 Sensor Misc APPLY TO ARM EVERY 14 DAYS TO MONITOR BLOOD SUGAR.   furosemide 20 MG tablet Commonly known as: LASIX Take 1 tablet by mouth every other day.   gabapentin 100 MG capsule Commonly known as: NEURONTIN Take 4 capsules (400 mg total) by mouth at bedtime.   insulin lispro 100 UNIT/ML KwikPen Commonly known as: HUMALOG INJECT 3 TO 5 UNITS SUBCUTANEOUSLY BEFORE MEALS AS DIRECTED.   levothyroxine 88 MCG tablet Commonly known as: SYNTHROID TAKE 1 TABLET (88 MCG TOTAL) BY  MOUTH DAILY.   losartan 50 MG tablet Commonly known as: COZAAR TAKE 1 TABLET BY MOUTH DAILY.   lovastatin 40 MG tablet Commonly known as: MEVACOR TAKE 1 TABLET BY MOUTH ONCE DAILY WITH A MEAL.   Omega-3 1000 MG Caps Take 1 capsule by mouth 4 (four) times daily.   OneTouch Verio test strip Generic drug: glucose blood USE TO TEST BLOOD SUGAR 4 TIMES DAILY   OneTouch Ultra test strip Generic drug: glucose blood Use to check blood sugar once a day if needed   pantoprazole 40 MG tablet Commonly known as: PROTONIX Take 40 mg by mouth 2 (two) times daily as needed (indigestion).   potassium chloride SA 20 MEQ tablet Commonly known as: KLOR-CON M TAKE 1 TABLET BY MOUTH DAILY AS NEEDED. TAKE WITH WATER PILL- LASIX.   PreserVision AREDS 2 Caps Take 1 capsule by mouth 2 (two) times daily.   senna-docusate 8.6-50 MG tablet Commonly known as: Senokot-S Take 1 tablet by mouth 2 (two) times daily. While on narcotics   Tresiba FlexTouch 100 UNIT/ML FlexTouch Pen Generic drug: insulin degludec INJECT 0.11 MLS (11 UNITS TOTAL) INTO THE SKIN DAILY. What changed: See the new instructions.   vitamin C 100 MG tablet Take 500 mg by mouth daily.   Vitamin D (Ergocalciferol) 1.25 MG (50000 UNIT) Caps capsule Commonly known as: DRISDOL TAKE 1 CAPSULE BY MOUTH ONCE A MONTH.        Allergies:  Allergies  Allergen Reactions   Macrodantin [Nitrofurantoin Macrocrystal] Swelling   Prednisone     Make real "shaky-like, nervious"   Penicillins Rash    Has patient had a PCN reaction causing immediate rash, facial/tongue/throat swelling, SOB or lightheadedness with hypotension:Yes Has  patient had a PCN reaction causing severe rash involving mucus membranes or skin necrosis: No Has patient had a PCN reaction that required hospitalization: No Has patient had a PCN reaction occurring within the last 10 years:No If all of the above answers are "NO", then may proceed with Cephalosporin use.      Past Medical History:  Diagnosis Date   Diabetes mellitus without complication (HCC)    Thyroid disease     Past Surgical History:  Procedure Laterality Date   ABDOMINAL HYSTERECTOMY     CHOLECYSTECTOMY     COLON SURGERY     ORIF TIBIA PLATEAU Right 12/04/2020   Procedure: OPEN REDUCTION INTERNAL FIXATION (ORIF) TIBIAL PLATEAU;  Surgeon: Roby Lofts, MD;  Location: [facility] OR;  Service: Orthopedics;  Laterality: Right;    Family History  Problem Relation Age of Onset   Cancer Mother    Diabetes Brother     Social History:  reports that she has never smoked. She has never used smokeless tobacco. She reports that she does not drink alcohol and does not use drugs.   Review of Systems   Peripheral NEUROPATHY with sensory symptoms: Treated with 300 mg gabapentin long-term, takes the whole dose at bedtime with relief  HYPOTHYROIDISM: She has had primary hypothyroidism, long-standing  No symptoms of fatigue No change in her chronic cold intolerance  Her TSH has been consistently normal, taking 88 micrograms levothyroxine daily  Lab Results  Component Value Date   TSH 1.62 10/06/2022   TSH 0.86 05/18/2022   TSH 1.83 02/09/2022   FREET4 1.33 10/06/2022   FREET4 1.08 02/09/2022   FREET4 0.90 12/05/2021       HYPERLIPIDEMIA: The lipid abnormality consists of elevated LDL,  taking lovastatin with consistently good control, last level below 70.  Lab Results  Component Value Date   CHOL 121 05/18/2022   HDL 45.90 05/18/2022   LDLCALC 38 05/18/2022   TRIG 187.0 (H) 05/18/2022   CHOLHDL 3 05/18/2022   She is still having some leg edema especially on the right Has not been taking Lasix prescribed by PCP and has difficulty putting on any support stockings   Examination:   BP 122/80 (BP Location: Left Arm, Patient Position: Sitting, Cuff Size: Large)   Pulse 74   Ht 4\' 11"  (1.499 m)   Wt 132 lb (59.9 kg)   SpO2 96%   BMI 26.66 kg/m   Body mass index is 26.66 kg/m.       ASSESSMENT/ PLAN:    Diabetes type 2 on insulin   See history of present illness for detailed discussion of current management, blood sugar patterns and problems identified  Her [name] is 7.8  She is on basal bolus insulin with relatively low insulin doses  Her blood sugars are still quite labile although considering her age is reasonably good with [name] below 8  She has less hypoglycemia than before but is tending to get spells of hyperglycemia that are not usually easily explained She could be missing some insulin doses causing the labile sugars and her daughter is concerned about her taking the proper doses   Discussed her day-to-day insulin management, factors causing high and low glucose and insulin adjustment, her daughter was advised to change Discussed that she needs to take her mealtime insulin consistently when she is eating and if the blood sugars are low normal she can take the injection right after eating Likely needs to have a snack if she is more active such as  going shopping Make sure she takes her Evaristo Bury consistently with her breakfast Humalog and go up to 11 units for now Showed her the Ilet pump which likely would be the simplest pump that she can use If this is approved she can have her daughter and son-in-law to help her with the cartridge and infusion changes as well as link of the sensor, she should be able to simply indicate to the pump when she needs a bolus for meals Likely will need a C-peptide test for this and she will come in fasting   Hypertension:  Her blood pressure is consistently well controlled on losartan 50 mg  Hypothyroidism : Needs follow-up TSH, subjectively doing well  For her edema she will take Lasix if the edema is significant otherwise try to use at least support hose or elastic stocking  Follow-up when she starts the new pump or in 6 weeks  Total visit time including counseling = 30 minutes  There are no Patient Instructions on  file for this visit.   Reather Littler 05/10/2023, 1:05 PM   Addendum: Labs stable

## 2023-05-12 ENCOUNTER — Encounter: Payer: Self-pay | Admitting: Endocrinology

## 2023-05-17 ENCOUNTER — Other Ambulatory Visit: Payer: PPO

## 2023-05-20 ENCOUNTER — Other Ambulatory Visit: Payer: Self-pay | Admitting: Endocrinology

## 2023-05-24 ENCOUNTER — Encounter: Payer: PPO | Admitting: Nurse Practitioner

## 2023-05-26 ENCOUNTER — Other Ambulatory Visit (INDEPENDENT_AMBULATORY_CARE_PROVIDER_SITE_OTHER): Payer: PPO

## 2023-05-26 DIAGNOSIS — E063 Autoimmune thyroiditis: Secondary | ICD-10-CM

## 2023-05-26 DIAGNOSIS — Z794 Long term (current) use of insulin: Secondary | ICD-10-CM | POA: Diagnosis not present

## 2023-05-26 DIAGNOSIS — E7800 Pure hypercholesterolemia, unspecified: Secondary | ICD-10-CM

## 2023-05-26 DIAGNOSIS — E1165 Type 2 diabetes mellitus with hyperglycemia: Secondary | ICD-10-CM | POA: Diagnosis not present

## 2023-05-26 LAB — LIPID PANEL
Cholesterol: 140 mg/dL (ref 0–200)
HDL: 48.3 mg/dL (ref >39.00)
LDL Cholesterol: 75 mg/dL (ref 0–99)
NonHDL: 91.4
Total CHOL/HDL Ratio: 3
Triglycerides: 84 mg/dL (ref 0.0–149.0)
VLDL: 16.8 mg/dL (ref 0.0–40.0)

## 2023-05-26 LAB — COMPREHENSIVE METABOLIC PANEL
ALT: 11 U/L (ref 0–35)
AST: 18 U/L (ref 0–37)
Albumin: 4.1 g/dL (ref 3.5–5.2)
Alkaline Phosphatase: 70 U/L (ref 39–117)
BUN: 16 mg/dL (ref 6–23)
CO2: 29 meq/L (ref 19–32)
Calcium: 9.1 mg/dL (ref 8.4–10.5)
Chloride: 106 meq/L (ref 96–112)
Creatinine, Ser: 0.8 mg/dL (ref 0.40–1.20)
GFR: 66.94 mL/min (ref >60.00)
Glucose, Bld: 141 mg/dL — ABNORMAL HIGH (ref 70–99)
Potassium: 4.1 meq/L (ref 3.5–5.1)
Sodium: 141 mEq/L (ref 135–145)
Total Bilirubin: 0.5 mg/dL (ref 0.2–1.2)
Total Protein: 6.9 g/dL (ref 6.0–8.3)

## 2023-05-27 ENCOUNTER — Other Ambulatory Visit: Payer: Self-pay | Admitting: Endocrinology

## 2023-05-27 DIAGNOSIS — E1165 Type 2 diabetes mellitus with hyperglycemia: Secondary | ICD-10-CM

## 2023-05-27 LAB — C-PEPTIDE: C-Peptide: <0.1 ng/mL — ABNORMAL LOW (ref 0.80–3.85)

## 2023-05-28 ENCOUNTER — Other Ambulatory Visit: Payer: Self-pay | Admitting: Endocrinology

## 2023-05-28 LAB — TSH: TSH: 6.01 u[IU]/mL — ABNORMAL HIGH (ref 0.35–5.50)

## 2023-05-31 ENCOUNTER — Telehealth: Payer: Self-pay | Admitting: Endocrinology

## 2023-05-31 NOTE — Telephone Encounter (Signed)
Please order Ilet pump on [employer] for her.  If the insurance wants a glucose and C-peptide level that is available on her last lab work.  Once available she and her daughter will be trained by Bonita Quin

## 2023-06-01 ENCOUNTER — Other Ambulatory Visit: Payer: Self-pay

## 2023-06-01 MED ORDER — LEVOTHYROXINE SODIUM 100 MCG PO TABS: 100.0000 ug | ORAL_TABLET | Freq: Every day | ORAL | 3 refills | Status: DC

## 2023-06-09 ENCOUNTER — Telehealth: Payer: Self-pay | Admitting: Family Medicine

## 2023-06-09 DIAGNOSIS — F320 Major depressive disorder, single episode, mild: Secondary | ICD-10-CM

## 2023-06-09 DIAGNOSIS — E1142 Type 2 diabetes mellitus with diabetic polyneuropathy: Secondary | ICD-10-CM

## 2023-06-09 DIAGNOSIS — Z794 Long term (current) use of insulin: Secondary | ICD-10-CM

## 2023-06-09 DIAGNOSIS — R413 Other amnesia: Secondary | ICD-10-CM

## 2023-06-09 DIAGNOSIS — E063 Autoimmune thyroiditis: Secondary | ICD-10-CM

## 2023-06-09 DIAGNOSIS — E1169 Type 2 diabetes mellitus with other specified complication: Secondary | ICD-10-CM

## 2023-06-09 NOTE — Telephone Encounter (Signed)
Spoke with Jennifer Bailey.  Mom is getting more confused.  Not sure if she is eating or taking meds.  Can we order paliative care for this patient?

## 2023-06-09 NOTE — Telephone Encounter (Signed)
Discussed with Minna Merritts and she will order.  Discussed with dtr [name] and she says please order.

## 2023-06-14 ENCOUNTER — Ambulatory Visit (INDEPENDENT_AMBULATORY_CARE_PROVIDER_SITE_OTHER): Payer: HMO | Admitting: Podiatry

## 2023-06-14 DIAGNOSIS — M79675 Pain in left toe(s): Secondary | ICD-10-CM

## 2023-06-14 DIAGNOSIS — M79674 Pain in right toe(s): Secondary | ICD-10-CM | POA: Diagnosis not present

## 2023-06-14 DIAGNOSIS — B351 Tinea unguium: Secondary | ICD-10-CM

## 2023-06-14 NOTE — Progress Notes (Signed)
  Subjective:  Patient ID: Jennifer Bailey, female    DOB: 11-11-1934,  MRN: [id number]  Chief Complaint  Patient presents with   Nail Problem    DFC    88 y.o. female presents with the above complaint. History confirmed with patient.  Says her blood sugar remains well controlled.  The nails are thickened elongated and causing pain she is unable to reach them or cut them.  Debridement helpful in reducing pain and improve pain function.   Objective:  Physical Exam: warm, good capillary refill, no trophic changes or ulcerative lesions, normal DP and PT pulses, normal monofilament exam, and normal sensory exam.  Left Foot: dystrophic yellowed discolored nail plates with subungual debris Right Foot: dystrophic yellowed discolored nail plates with subungual debris Assessment:   1. Pain due to onychomycosis of toenails of both feet      Plan:  Patient was evaluated and treated and all questions answered.    Discussed the etiology and treatment options for the condition in detail with the patient. Educated patient on the topical and oral treatment options for mycotic nails. Recommended debridement of the nails today. Sharp and mechanical debridement performed of all painful and mycotic nails today. Nails debrided in length and thickness using a nail nipper to level of comfort. Discussed treatment options including appropriate shoe gear. Follow up as needed for painful nails.    Return in about 3 months (around 09/13/2023) for at risk diabetic foot care.

## 2023-06-21 ENCOUNTER — Ambulatory Visit (INDEPENDENT_AMBULATORY_CARE_PROVIDER_SITE_OTHER): Payer: PPO | Admitting: Endocrinology

## 2023-06-21 ENCOUNTER — Encounter: Payer: Self-pay | Admitting: Endocrinology

## 2023-06-21 VITALS — BP 150/60 | HR 74 | Ht 59.0 in | Wt 135.2 lb

## 2023-06-21 DIAGNOSIS — E063 Autoimmune thyroiditis: Secondary | ICD-10-CM | POA: Diagnosis not present

## 2023-06-21 DIAGNOSIS — Z794 Long term (current) use of insulin: Secondary | ICD-10-CM | POA: Diagnosis not present

## 2023-06-21 DIAGNOSIS — E1165 Type 2 diabetes mellitus with hyperglycemia: Secondary | ICD-10-CM

## 2023-06-21 MED ORDER — GVOKE HYPOPEN 1-PACK 1 MG/0.2ML ~~LOC~~ SOAJ: 1.0000 mg | SUBCUTANEOUS | 2 refills | Status: DC | PRN

## 2023-06-21 NOTE — Patient Instructions (Addendum)
Diabetes regimen Decrease Tresiba to 8 units daily. Humalog 4 to 5 units with meals 3 times a day and take 1 unit additional for high sugar meal/dessert / ice cream total up to 6 units with meals.  Okay to keep bedtime sugar in 200s range. If glucose at bedtime < 150, eat bedtime snack.  Once you receive insulin pump call our clinic, will set pump training.

## 2023-06-21 NOTE — Progress Notes (Unsigned)
Outpatient Endocrinology Note [name], MD  06/22/23  Patient's Name: Jennifer Bailey    DOB: 12-17-1934    MRN: [id number]                                                    REASON OF VISIT: Follow up for type 2 diabetes mellitus /hypothyroidism  PCP: Early, Sung Amabile, NP  HISTORY OF PRESENT ILLNESS:   Jennifer Bailey is a 88 y.o. old female with past medical history listed below, is here for follow up of type 2 diabetes mellitus / hypothyroidism.  Patient was last seen by Dr. Lucianne Muss in August 2024.  Pertinent Diabetes History: Patient was diagnosed with type 2 diabetes mellitus in 2003.  Patient has labile blood sugar despite being on basal bolus insulin regimen. In the last visit in August, 2024, plan was made for insulin pump iLet picked betabionic , orders initiated.  Patient has not received the pump yet.  Chronic Diabetes Complications : Retinopathy: no. Last ophthalmology exam was done on annually, reportedly. Nephropathy: CKD, on losartan. Peripheral neuropathy: Yes, mild, on gabapentin. Coronary artery disease: no Stroke: no  Relevant comorbidities and cardiovascular risk factors: Obesity: no Body mass index is 27.31 kg/m.  Hypertension: yes Hyperlipidemia. Yes, on statin.   Current / Home Diabetic regimen includes: Tresiba 11 units in the morning. Humalog 4-5 units with meals three times a day.   Prior diabetic medications:  Glycemic data:    CONTINUOUS GLUCOSE MONITORING SYSTEM (CGMS) INTERPRETATION: At today's visit, we reviewed CGM downloads. The full report is scanned in the media. Reviewing the CGM trends, blood glucose are as follows:  FreeStyle Libre 2 CGM-  Sensor Download (Sensor download was reviewed and summarized below.) Dates: September 3 to June 21, 2023 for 14 days Sensor Average: 205  Glucose Management Indicator: 8.2% Glucose Variability: 42.8%  % data captured: 93%  Glycemic Trends:  <54: 0% 54-70: 1% 71-180: 45% 181-250:  27% 251-400: 27%   Interpretation: -Patient has variable blood sugars.  Patient is having mostly hyperglycemia related with meals especially with lunch, supper and occasionally with breakfast with blood sugar up to 300 range and rarely up to 400, some of the hyperglycemia related with sugary snacks for example ice cream.  Few of the days she has acceptable blood sugar with mild hyperglycemia up to 200s.  She has occasional hypoglycemia in the early morning up to 50 - 60s range.  She tends to have hypoglycemia overnight or early morning when her bedtime blood sugar are in the normal or lower normal range.  Hypoglycemia: Patient has hypoglycemic episodes. Patient has hypoglycemia awareness.  Factors modifying glucose control: 1.  Diabetic diet assessment: 3 meals a day.  Usually small portion.  2.  Staying active or exercising: Some walking and yard work.  3.  Medication compliance: compliant all of the time.  # Osteopenia -She has osteopenia T-score -2.3 at the hip and 2009 was started on Fosamax at that time and is stopped in 2015.  Fosamax/alendronate weekly was restarted in November 2022 when the bone mineral density was slightly lower.  DEXA scan in November 2022 consistent with osteopenia with T-score of -2.18 right femoral neck and -1.9 at total hip, T-score of -2.4 at left radius. She has history of traumatic fracture of the tibial plateau in 2022.  She  has vitamin D deficiency currently taking 50,000 units monthly with adequate level.  # Hypothyroidism has primary hypothyroidism for several years.  She has been on thyroid hormone replacement, currently levothyroxine 100 mcg daily.   Interval history 06/22/23 In the last visit with Dr. Lucianne Muss in August iLet insulin pump was ordered and he started process through [employer], waiting to get final approval and delivered to patient's home.  Patient is accompanied by daughter, both patient and daughter does not know detail about the pump.   Patient will need pump training after receiving insulin pump.  CGM data as reviewed above.  She is still having significant hyperglycemia with meals and snack however also having hypoglycemia especially overnight and early morning.  She has been taking increased dose of levothyroxine 100 mg daily from end of August.  She has been taking alendronate weekly and has been on vitamin D and calcium supplement.  She generally eats a bedtime snack crackers, peanut butter with the fear of having hypoglycemia overnight.  No other complaints today.  Patient lives by herself however one of her daughters lives close by her and is helping her medications.  REVIEW OF SYSTEMS As per history of present illness.   PAST MEDICAL HISTORY: Past Medical History:  Diagnosis Date   Diabetes mellitus without complication (HCC)    Thyroid disease     PAST SURGICAL HISTORY: Past Surgical History:  Procedure Laterality Date   ABDOMINAL HYSTERECTOMY     CHOLECYSTECTOMY     COLON SURGERY     ORIF TIBIA PLATEAU Right 12/04/2020   Procedure: OPEN REDUCTION INTERNAL FIXATION (ORIF) TIBIAL PLATEAU;  Surgeon: Roby Lofts, MD;  Location: [facility] OR;  Service: Orthopedics;  Laterality: Right;    ALLERGIES: Allergies  Allergen Reactions   Macrodantin [Nitrofurantoin Macrocrystal] Swelling   Prednisone     Make real "shaky-like, nervious"   Penicillins Rash    Has patient had a PCN reaction causing immediate rash, facial/tongue/throat swelling, SOB or lightheadedness with hypotension:Yes Has patient had a PCN reaction causing severe rash involving mucus membranes or skin necrosis: No Has patient had a PCN reaction that required hospitalization: No Has patient had a PCN reaction occurring within the last 10 years:No If all of the above answers are "NO", then may proceed with Cephalosporin use.     FAMILY HISTORY:  Family History  Problem Relation Age of Onset   Cancer Mother    Diabetes Brother      SOCIAL HISTORY: Social History   Socioeconomic History   Marital status: Widowed    Spouse name: Not on file   Number of children: Not on file   Years of education: Not on file   Highest education level: Not on file  Occupational History   Not on file  Tobacco Use   Smoking status: Never   Smokeless tobacco: Never  Vaping Use   Vaping status: Never Used  Substance and Sexual Activity   Alcohol use: Never   Drug use: Never   Sexual activity: Not on file  Other Topics Concern   Not on file  Social History Narrative   Not on file   Social Determinants of Health   Financial Resource Strain: Low Risk  (02/16/2023)   Overall Financial Resource Strain (CARDIA)    Difficulty of Paying Living Expenses: Not hard at all  Food Insecurity: No Food Insecurity (02/16/2023)   Hunger Vital Sign    Worried About Running Out of Food in the Last Year: Never true  Ran Out of Food in the Last Year: Never true  Transportation Needs: No Transportation Needs (02/16/2023)   PRAPARE - Administrator, Civil Service (Medical): No    Lack of Transportation (Non-Medical): No  Physical Activity: Sufficiently Active (02/16/2023)   Exercise Vital Sign    Days of Exercise per Week: 5 days    Minutes of Exercise per Session: 30 min  Stress: No Stress Concern Present (02/16/2023)   [employer] of Occupational Health - Occupational Stress Questionnaire    Feeling of Stress : Not at all  Social Connections: Not on file    MEDICATIONS:  Current Outpatient Medications  Medication Sig Dispense Refill   Alcohol Swabs (ALCOHOL PADS) 70 % PADS USE 4 PER DAY 400 each 3   alendronate (FOSAMAX) 70 MG tablet TAKE 1 TABLET BY MOUTH EVERY 7 DAYS. TAKE WITH A FULL GLASS OF WATER ON AN EMPTY STOMACH. 4 tablet 11   ammonium lactate (AMLACTIN) 12 % cream Apply 1 Application topically as needed for dry skin. 385 g 0   aspirin 81 MG tablet Take 1 tablet (81 mg total) by mouth daily.     BD PEN  NEEDLE NANO U/F 32G X 4 MM MISC USE AS DIRECTED FIVE TIMES A DAY. 200 each 5   brimonidine (ALPHAGAN P) 0.1 % SOLN Place 1 drop into the left eye 2 (two) times daily.     Continuous Blood Gluc Receiver (FREESTYLE LIBRE 14 DAY READER) DEVI USE DAILY TO CHECK BLOOD SUGAR 1 each 0   Continuous Glucose Sensor (FREESTYLE LIBRE 2 SENSOR) MISC APPLY TO ARM EVERY 14 DAYS TO MONITOR BLOOD SUGAR. 2 each 3   cycloSPORINE (RESTASIS) 0.05 % ophthalmic emulsion 1 drop 2 (two) times daily.     dorzolamide-timolol (COSOPT) 2-0.5 % ophthalmic solution Place 1 drop into both eyes 2 (two) times daily.     dorzolamidel-timolol (COSOPT) 22.3-6.8 MG/ML SOLN ophthalmic solution Place 1 drop into the left eye 2 (two) times daily.     furosemide (LASIX) 20 MG tablet Take 1 tablet by mouth every other day. 45 tablet 1   gabapentin (NEURONTIN) 100 MG capsule TAKE 4 CAPSULES (400 MG TOTAL) BY MOUTH AT BEDTIME. 120 capsule 3   Glucagon (GVOKE HYPOPEN 1-PACK) 1 MG/0.2ML SOAJ Inject 1 mg into the skin as needed (low blood sugar with impaired consciousness). 0.4 mL 2   glucose blood (ONETOUCH ULTRA) test strip Use to check blood sugar once a day if needed 50 each 2   insulin lispro (HUMALOG) 100 UNIT/ML KwikPen INJECT 3 TO 5 UNITS SUBCUTANEOUSLY BEFORE MEALS AS DIRECTED. 15 mL 11   levothyroxine (SYNTHROID) 100 MCG tablet Take 1 tablet (100 mcg total) by mouth daily before breakfast. 30 tablet 3   losartan (COZAAR) 50 MG tablet TAKE 1 TABLET BY MOUTH DAILY. 90 tablet 1   lovastatin (MEVACOR) 40 MG tablet TAKE 1 TABLET BY MOUTH ONCE DAILY WITH A MEAL. 90 tablet 4   Multiple Vitamins-Minerals (PRESERVISION AREDS 2) CAPS Take 1 capsule by mouth 2 (two) times daily.     Omega-3 1000 MG CAPS Take 1 capsule by mouth 4 (four) times daily.     ONETOUCH VERIO test strip USE TO TEST BLOOD SUGAR 4 TIMES DAILY 150 strip 2   TRESIBA FLEXTOUCH 100 UNIT/ML FlexTouch Pen INJECT 0.11 MLS (11 UNITS TOTAL) INTO THE SKIN DAILY. (Patient taking  differently: Inject 10 Units into the skin daily.) 15 mL 3   Ascorbic Acid (VITAMIN C) 100 MG  tablet Take 500 mg by mouth daily.  (Patient not taking: Reported on 06/21/2023)     pantoprazole (PROTONIX) 40 MG tablet Take 40 mg by mouth 2 (two) times daily as needed (indigestion). (Patient not taking: Reported on 06/21/2023)     potassium chloride SA (KLOR-CON M) 20 MEQ tablet TAKE 1 TABLET BY MOUTH DAILY AS NEEDED. TAKE WITH WATER PILL- LASIX. (Patient not taking: Reported on 06/21/2023) 30 tablet 1   senna-docusate (SENOKOT-S) 8.6-50 MG tablet Take 1 tablet by mouth 2 (two) times daily. While on narcotics (Patient not taking: Reported on 06/21/2023)     Vitamin D, Ergocalciferol, (DRISDOL) 1.25 MG (50000 UNIT) CAPS capsule TAKE 1 CAPSULE BY MOUTH ONCE A MONTH. 12 capsule 0   No current facility-administered medications for this visit.    PHYSICAL EXAM: Vitals:   06/21/23 1347  BP: (!) 150/60  Pulse: 74  SpO2: 99%  Weight: 135 lb 3.2 oz (61.3 kg)  Height: 4\' 11"  (1.499 m)   Body mass index is 27.31 kg/m.  Wt Readings from Last 3 Encounters:  06/21/23 135 lb 3.2 oz (61.3 kg)  05/10/23 132 lb (59.9 kg)  02/16/23 120 lb (54.4 kg)    General: Well developed, well nourished female in no apparent distress.  HEENT: AT/[place], no external lesions.  Eyes: Conjunctiva clear and no icterus. Neck: Neck supple  Lungs: Respirations not labored Neurologic: Alert, oriented, normal speech Extremities / Skin: Dry. No sores or rashes noted.  Psychiatric: Does not appear depressed or anxious  Diabetic Foot Exam - Simple   No data filed     LABS Reviewed Lab Results  Component Value Date   HGBA1C 7.8 (A) 05/10/2023   HGBA1C 7.6 (A) 02/08/2023   HGBA1C 7.1 (A) 10/06/2022   No results found for: "FRUCTOSAMINE" Lab Results  Component Value Date   CHOL 140 05/26/2023   HDL 48.30 05/26/2023   LDLCALC 75 05/26/2023   TRIG 84.0 05/26/2023   CHOLHDL 3 05/26/2023   Lab Results  Component Value  Date   MICRALBCREAT 3.4 05/10/2023   MICRALBCREAT 4.4 05/18/2022   Lab Results  Component Value Date   CREATININE 0.80 05/26/2023   Lab Results  Component Value Date   GFR 66.94 05/26/2023    ASSESSMENT / PLAN  1. Uncontrolled type 2 diabetes mellitus with hyperglycemia, with long-term current use of insulin (HCC)   2. Acquired autoimmune hypothyroidism     Diabetes Mellitus type 2, complicated by peripheral neuropathy. - Diabetic status / severity: Fair control however with hypo and hyperglycemia.  Labile blood sugars.  Lab Results  Component Value Date   HGBA1C 7.8 (A) 05/10/2023    - Hemoglobin [name] goal : <7.5%  - Medications: see below.  Diabetes regimen Decrease Tresiba to 8 units daily.  Due to risks and fear of hypoglycemia. Humalog 4 to 5 units with meals 3 times a day and take 1 unit additional for high sugar meal/dessert / ice cream take up to 6 units with meals.  Okay to keep bedtime sugar in 200s range. If glucose at bedtime < 150, eat bedtime snack.  Process for iLet insulin pump started.  She will benefit from insulin pump variable and labile blood sugar control with basal and bolus regimen.  She however will require help, her daughter and son-in-law will be helping out with change infusion set, cartridge and sensor.  She had undetectable C-peptide last month as follows.  Once you receive insulin pump call our clinic, will set pump training.  Latest Reference Range & Units 05/26/23 08:37  Glucose 70 - 99 mg/dL 540 (H)  C-Peptide 0.86 - 3.85 ng/mL <0.10 (L)  (H): Data is abnormally high (L): Data is abnormally low  - Home glucose testing: Continue freestyle libre 2 and check as needed. - Discussed/ Gave Hypoglycemia treatment plan.  # Consult : not required at this time.   # Annual urine for microalbuminuria/ creatinine ratio, no microalbuminuria currently, continue ACE/ARB /losartan. Last  Lab Results  Component Value Date   MICRALBCREAT 3.4  05/10/2023    # Foot check nightly / neuropathy, continue gabapentin.  # Annual dilated diabetic eye exams.   - Diet: Make healthy diabetic food choices - Life style / activity / exercise: Discussed.  2. Blood pressure  -  BP Readings from Last 1 Encounters:  06/21/23 (!) 150/60    - Control is in target.  - No change in current plans.  3. Lipid status / Hyperlipidemia - Last  Lab Results  Component Value Date   LDLCALC 75 05/26/2023   - Continue lovastatin 40 mg daily.  # Primary hypothyroidism. -Currently taking levothyroxine 100 mcg daily.  Dose was increased about 3 weeks ago. -Will check thyroid function test in 1 month, we will check in next follow-up visit.  Jennifer Bailey was seen today for hypertension.  Diagnoses and all orders for this visit:  Uncontrolled type 2 diabetes mellitus with hyperglycemia, with long-term current use of insulin (HCC)  Acquired autoimmune hypothyroidism  Other orders -     Glucagon (GVOKE HYPOPEN 1-PACK) 1 MG/0.2ML SOAJ; Inject 1 mg into the skin as needed (low blood sugar with impaired consciousness).    DISPOSITION Follow up in clinic in 1 month suggested.  Patient and daughter want to make follow-up in coordination with other doctors appointment.   All questions answered and patient verbalized understanding of the plan.  [place] Demont Linford, MD [facility] [address] [place], [address] [place], [place] [address] Phone # [phone]  At least part of this note was generated using voice recognition software. Inadvertent word errors may have occurred, which were not recognized during the proofreading process.

## 2023-06-22 ENCOUNTER — Ambulatory Visit: Payer: HMO | Admitting: Podiatry

## 2023-06-22 ENCOUNTER — Encounter: Payer: Self-pay | Admitting: Endocrinology

## 2023-06-23 ENCOUNTER — Telehealth: Payer: Self-pay | Admitting: Family Medicine

## 2023-06-23 NOTE — Addendum Note (Signed)
Addended by: Tobenna Needs, [name] E on: 06/23/2023 12:23 PM   Modules accepted: Orders

## 2023-06-23 NOTE — Telephone Encounter (Signed)
[facility] called to confirm that they have received the referral for Paliative care for patient and would schedule a visit in the next few days.

## 2023-06-29 ENCOUNTER — Telehealth: Payer: Self-pay | Admitting: Family Medicine

## 2023-06-29 NOTE — Telephone Encounter (Signed)
[name] (daughter) called for status of Paliative care.    Called Smita at [employer] [employer], she will call me back this afternoon with status.  Judy Pimple of same.  [name] and [name] on [employer]

## 2023-06-30 ENCOUNTER — Telehealth: Payer: Self-pay | Admitting: Family Medicine

## 2023-06-30 NOTE — Telephone Encounter (Signed)
[name] heard from Palliative care which told her they could not help her.  That her mom needed to see a neurologist for diagnosis.  If she is diagnosed with Alzheimers or dementia there is a program out there to help.  The Guide program, possibly.  Please refer her to neuro as her decline has gotten worse.

## 2023-07-07 ENCOUNTER — Encounter: Payer: Self-pay | Admitting: Nurse Practitioner

## 2023-07-07 ENCOUNTER — Other Ambulatory Visit: Payer: Self-pay | Admitting: Nurse Practitioner

## 2023-07-07 ENCOUNTER — Telehealth: Payer: Self-pay | Admitting: Family Medicine

## 2023-07-07 DIAGNOSIS — H04123 Dry eye syndrome of bilateral lacrimal glands: Secondary | ICD-10-CM | POA: Diagnosis not present

## 2023-07-07 DIAGNOSIS — Z794 Long term (current) use of insulin: Secondary | ICD-10-CM | POA: Diagnosis not present

## 2023-07-07 DIAGNOSIS — H401221 Low-tension glaucoma, left eye, mild stage: Secondary | ICD-10-CM | POA: Diagnosis not present

## 2023-07-07 DIAGNOSIS — H353131 Nonexudative age-related macular degeneration, bilateral, early dry stage: Secondary | ICD-10-CM | POA: Diagnosis not present

## 2023-07-07 DIAGNOSIS — E119 Type 2 diabetes mellitus without complications: Secondary | ICD-10-CM | POA: Diagnosis not present

## 2023-07-07 DIAGNOSIS — H43812 Vitreous degeneration, left eye: Secondary | ICD-10-CM | POA: Diagnosis not present

## 2023-07-07 DIAGNOSIS — Z961 Presence of intraocular lens: Secondary | ICD-10-CM | POA: Diagnosis not present

## 2023-07-07 DIAGNOSIS — H40011 Open angle with borderline findings, low risk, right eye: Secondary | ICD-10-CM | POA: Diagnosis not present

## 2023-07-07 LAB — HM DIABETES EYE EXAM

## 2023-07-07 NOTE — Telephone Encounter (Signed)
Called [name] advised [name] is reaching out to [employer] in [place] NP for services. Possibly Child psychotherapist or home health or pill packs, etc.  Also requested Virtual visit with pt.  [name] will talk with [name] and call me back.

## 2023-07-08 NOTE — Telephone Encounter (Signed)
Reached out to [facility] for guidance and information provided. Will bring patient in this week for mental status evaluation for diagnosis.

## 2023-07-12 ENCOUNTER — Encounter: Payer: Self-pay | Admitting: Nurse Practitioner

## 2023-07-12 ENCOUNTER — Telehealth (INDEPENDENT_AMBULATORY_CARE_PROVIDER_SITE_OTHER): Payer: PPO | Admitting: Nurse Practitioner

## 2023-07-12 VITALS — Wt 135.0 lb

## 2023-07-12 DIAGNOSIS — F0390 Unspecified dementia without behavioral disturbance: Secondary | ICD-10-CM

## 2023-07-12 NOTE — Progress Notes (Signed)
Virtual Visit Encounter mychart visit.   I connected with  Jennifer Bailey on 07/27/23 at  4:00 PM EDT by secure video and audio telemedicine application. I verified that I am speaking with the correct person using two identifiers.   I introduced myself as a Publishing rights manager with the practice. The limitations of evaluation and management by telemedicine discussed with the patient and the availability of in person appointments. The patient expressed verbal understanding and consent to proceed.  Participating parties in this visit include: Myself and patient and daughter  The patient is: Patient Location: Home I am: Provider Location: Office/Clinic Subjective:    CC and HPI: Jennifer Bailey is a 88 y.o. year old female presenting for follow-up of memory concerns.   History of Present Illness The visit is with both Jennifer Bailey and her daughter, Jennifer Bailey. Jennifer Bailey has a history of diabetes and visual impairment. She presents with concerns of memory issues and loneliness. Her daughter expresses significant concern over mental function decline. Over the past year, she has had one or two falls, one of which was associated with a hypoglycemic episode while vacuuming. She did not sustain any injuries from these falls. Jennifer Bailey has been experiencing difficulties with short-term memory, demonstrated by her inability to recall the current year and an address provided to her during the consultation. However, she is able to perform daily activities independently, including dressing, preparing breakfast, and taking her medications with the aid of a timer. She prefers to have someone present when showering but is able to do so independently. Currently, Jennifer Bailey has no issues with mobility, including climbing stairs, and her vision is satisfactory with glasses. She sleeps approximately eight hours per night, from around 8 PM to 4 AM. She occasionally misplaces items but generally follows a daily routine. Despite  these memory issues, Jennifer Bailey's long-term memory appears to be intact. The family is concerned that the decline that have seen indicates progression of her memory changes, and they request evaluation of her current memory status.   Past medical history, Surgical history, Family history not pertinant except as noted below, Social history, Allergies, and medications have been entered into the medical record, reviewed, and corrections made.   Review of Systems:  All review of systems negative except what is listed in the HPI  Objective:    Patient is alert and oriented at this time.  Speaking in clear sentences with no shortness of breath. No distress. She shows difficulty with recent recall.     01/27/2022    2:51 PM 11/23/2022   10:03 AM 12/25/2022   10:16 AM 02/16/2023    9:19 AM 07/12/2023    4:25 PM  Fall Risk  Falls in the past year? 1 1 0 0 1  Was there an injury with Fall? 1 0 0 0 0  Fall Risk Category Calculator 2 1 0 0 1  Fall Risk Category (Retired) Moderate      (RETIRED) Patient Fall Risk Level Moderate fall risk      Patient at Risk for Falls Due to History of fall(s)  No Fall Risks Medication side effect History of fall(s);Impaired balance/gait;Mental status change  Fall risk Follow up Falls evaluation completed Falls evaluation completed Falls evaluation completed Falls prevention discussed;Education provided;Falls evaluation completed Falls evaluation completed;Education provided;Falls prevention discussed      07/12/2023    4:26 PM 02/16/2023    9:22 AM 10/01/2021    1:54 PM  6CIT Screen  What Year? 4 points 4 points 0 points  What month? 3 points 0 points 0 points  What time? 0 points 3 points 0 points  Count back from 20 0 points 2 points 0 points  Months in reverse 4 points 0 points 4 points  Repeat phrase 10 points 6 points 2 points  Total Score 21 points 15 points 6 points    Functional Status Survey: Is the patient deaf or have difficulty hearing?: Yes Does  the patient have difficulty seeing, even when wearing glasses/contacts?: No Does the patient have difficulty concentrating, remembering, or making decisions?: Yes Does the patient have difficulty walking or climbing stairs?: Yes Does the patient have difficulty dressing or bathing?: No Does the patient have difficulty doing errands alone such as visiting a doctor's office or shopping?: Yes  Flowsheet Row Video Visit from 07/12/2023 in [facility]  PHQ-2 Total Score 0       Impression and Recommendations:    Problem List Items Addressed This Visit     Dementia without behavioral disturbance, psychotic disturbance, mood disturbance, or anxiety (HCC) - Primary    Difficulty with short-term memory, orientation to time, and recall of recent information. Able to perform daily activities independently with the aid of a medication timer. Screening evaluations today show decline in memory function consistent with dementia. The patient also expresses feelings of loneliness. Risks with the patient remaining in the home alone on routine basis include increased risk of falls with history of falls and history of hypoglycemic episodes.  -Monitor cognitive function and social situation.  -Address any emerging concerns promptly. -Compile and submit information to [facility] for service provision. -Referral to neuropsychology placed on separate encounter       orders and follow up as documented in EMR I discussed the assessment and treatment plan with the patient. The patient was provided an opportunity to ask questions and all were answered. The patient agreed with the plan and demonstrated an understanding of the instructions.   The patient was advised to call back or seek an in-person evaluation if the symptoms worsen or if the condition fails to improve as anticipated.  Follow-Up: in 3 months  I provided 25 minutes of non-face-to-face interaction with this non face-to-face encounter  including intake, same-day documentation, and chart review.   Tollie Eth, NP , DNP, AGNP-c [facility]

## 2023-07-20 NOTE — Telephone Encounter (Signed)
Patient's C-peptide and FBS was faxed to the pump company at their request on 07/20/23

## 2023-07-27 ENCOUNTER — Encounter: Payer: Self-pay | Admitting: Nurse Practitioner

## 2023-07-27 DIAGNOSIS — F0390 Unspecified dementia without behavioral disturbance: Secondary | ICD-10-CM | POA: Insufficient documentation

## 2023-07-27 NOTE — Assessment & Plan Note (Addendum)
Difficulty with short-term memory, orientation to time, and recall of recent information. Able to perform daily activities independently with the aid of a medication timer. Screening evaluations today show decline in memory function consistent with dementia. The patient also expresses feelings of loneliness. Risks with the patient remaining in the home alone on routine basis include increased risk of falls with history of falls and history of hypoglycemic episodes.  -Monitor cognitive function and social situation.  -Address any emerging concerns promptly. -Compile and submit information to [facility] for service provision. -Referral to neuropsychology placed on separate encounter

## 2023-08-18 ENCOUNTER — Ambulatory Visit: Payer: PPO | Admitting: Endocrinology

## 2023-08-18 ENCOUNTER — Encounter: Payer: Self-pay | Admitting: Endocrinology

## 2023-08-18 VITALS — BP 130/58 | HR 68 | Resp 20 | Ht 59.0 in | Wt 133.2 lb

## 2023-08-18 DIAGNOSIS — E1165 Type 2 diabetes mellitus with hyperglycemia: Secondary | ICD-10-CM | POA: Diagnosis not present

## 2023-08-18 DIAGNOSIS — M85859 Other specified disorders of bone density and structure, unspecified thigh: Secondary | ICD-10-CM

## 2023-08-18 DIAGNOSIS — Z794 Long term (current) use of insulin: Secondary | ICD-10-CM | POA: Diagnosis not present

## 2023-08-18 DIAGNOSIS — E063 Autoimmune thyroiditis: Secondary | ICD-10-CM | POA: Diagnosis not present

## 2023-08-18 LAB — POCT GLYCOSYLATED HEMOGLOBIN (HGB A1C): Hemoglobin A1C: 8.2 % — AB (ref 4.0–5.6)

## 2023-08-18 NOTE — Progress Notes (Addendum)
Outpatient Endocrinology Note [place] Barrero, MD  08/18/23  Patient's Name: Jennifer Bailey    DOB: 30-Jun-1935    MRN: [id number]                                                    REASON OF VISIT: Follow up for type 2 diabetes mellitus /hypothyroidism  PCP: Early, Sung Amabile, NP  HISTORY OF PRESENT ILLNESS:   Jennifer Bailey is a 88 y.o. old female with past medical history listed below, is here for follow up of type 2 diabetes mellitus / hypothyroidism.    Pertinent Diabetes History: Patient was diagnosed with type 2 diabetes mellitus in 2003.  Patient has labile blood sugar despite being on basal bolus insulin regimen.  In August, 2024, plan was made for insulin pump [employer] [facility] [employer], patient has the pump has not restarted it..  Chronic Diabetes Complications : Retinopathy: no. Last ophthalmology exam was done on annually 07/2023, reportedly. Nephropathy: CKD, on losartan. Peripheral neuropathy: Yes, mild, on gabapentin. Coronary artery disease: no Stroke: no  Relevant comorbidities and cardiovascular risk factors: Obesity: no Body mass index is 26.9 kg/m.  Hypertension: yes Hyperlipidemia. Yes, on statin.   Current / Home Diabetic regimen includes: Tresiba 10 units in the morning. Humalog 2-6 units with meals three times a day.   Prior diabetic medications:  Glycemic data:    CONTINUOUS GLUCOSE MONITORING SYSTEM (CGMS) INTERPRETATION: At today's visit, we reviewed CGM downloads. The full report is scanned in the media. Reviewing the CGM trends, blood glucose are as follows:  FreeStyle Libre 2 CGM with reader-  Sensor Download (Sensor download was reviewed and summarized below.) Dates: In last 14 days.  However on the pump date was wrong it was downloaded as November 19 to September 05, 2026. Sensor Average: 137 Glucose Management Indicator: 6.6% Glucose Variability: 33.5%  % data captured: 97%  Glycemic Trends:  <54: 0% 54-70: 4% 71-180: 77% 181-250:  18% 251-400: 1%  Interpretation: Mostly acceptable blood sugar with hypoglycemia infrequently overnight, early morning and sometime in the afternoon with blood sugar up to upper 50s to 60s range.  No significant hyperglycemia.  Hypoglycemia seems to be mostly related with higher basal insulin and some time related with mealtime insulin.  She has improvement on blood sugar in normal range with less of the hyperglycemia.  GMI 6.6%.  Hypoglycemia: Patient has hypoglycemic episodes. Patient has hypoglycemia awareness.  Factors modifying glucose control: 1.  Diabetic diet assessment: 3 meals a day.  Usually small portion.  2.  Staying active or exercising: Some walking and yard work.  3.  Medication compliance: compliant all of the time.  # Osteopenia -She has osteopenia T-score -2.3 at the hip and 2009 was started on Fosamax at that time and is stopped in 2015.  Fosamax/alendronate weekly was restarted in November 2022 when the bone mineral density was slightly lower.  DEXA scan in November 2022 consistent with osteopenia with T-score of -2.18 right femoral neck and -1.9 at total hip, T-score of -2.4 at left radius. She has history of traumatic fracture of the tibial plateau in 2022.  She has vitamin D deficiency currently taking 50,000 units monthly with adequate level.  # Hypothyroidism has primary hypothyroidism for several years.  She has been on thyroid hormone replacement, currently levothyroxine 100 mcg daily.  Interval  history 08/18/23 CGM data as reviewed above.  She is still having hypoglycemia.  Diabetes regimen as reviewed above.  She has still been taking [place] 10 units despite the recommendation to decrease to 8 units in last visit.  She has been adjusting the dose of NovoLog with the help of our daughter and son-in-law usually 2 to 6 units based on the meal size and blood sugar at that time.  Patient has received iLet [employer] pump however patient and family is hesitant to  use it at this time.  She has been taking increased dose of levothyroxine 100 mg daily from end of August.  Denies palpitation or heat intolerance.  She has been taking alendronate weekly and has been on vitamin D and calcium supplement.  Patient lives by herself however one of her daughters lives close by her and is helping her medications.  REVIEW OF SYSTEMS As per history of present illness.   PAST MEDICAL HISTORY: Past Medical History:  Diagnosis Date   Depression    Diabetes mellitus without complication (HCC)    Hyperlipidemia associated with type 2 diabetes mellitus (HCC)    Hypertension associated with type 2 diabetes mellitus (HCC)    Memory changes    Osteoporosis    Polyneuropathy due to type 2 diabetes mellitus (HCC)    Thyroid disease    Unsteady gait     PAST SURGICAL HISTORY: Past Surgical History:  Procedure Laterality Date   ABDOMINAL HYSTERECTOMY     CHOLECYSTECTOMY     COLON SURGERY     ORIF TIBIA PLATEAU Right 12/04/2020   Procedure: OPEN REDUCTION INTERNAL FIXATION (ORIF) TIBIAL PLATEAU;  Surgeon: Roby Lofts, MD;  Location: [facility] OR;  Service: Orthopedics;  Laterality: Right;    ALLERGIES: Allergies  Allergen Reactions   Macrodantin [Nitrofurantoin Macrocrystal] Swelling   Prednisone     Make real "shaky-like, nervious"   Penicillins Rash    Has patient had a PCN reaction causing immediate rash, facial/tongue/throat swelling, SOB or lightheadedness with hypotension:Yes Has patient had a PCN reaction causing severe rash involving mucus membranes or skin necrosis: No Has patient had a PCN reaction that required hospitalization: No Has patient had a PCN reaction occurring within the last 10 years:No If all of the above answers are "NO", then may proceed with Cephalosporin use.     FAMILY HISTORY:  Family History  Problem Relation Age of Onset   Cancer Mother    Diabetes Brother     SOCIAL HISTORY: Social History   Socioeconomic History    Marital status: Widowed    Spouse name: Not on file   Number of children: Not on file   Years of education: Not on file   Highest education level: Not on file  Occupational History   Not on file  Tobacco Use   Smoking status: Never   Smokeless tobacco: Never  Vaping Use   Vaping status: Never Used  Substance and Sexual Activity   Alcohol use: Never   Drug use: Never   Sexual activity: Not on file  Other Topics Concern   Not on file  Social History Narrative   Not on file   Social Determinants of Health   Financial Resource Strain: Low Risk  (02/16/2023)   Overall Financial Resource Strain (CARDIA)    Difficulty of Paying Living Expenses: Not hard at all  Food Insecurity: No Food Insecurity (02/16/2023)   Hunger Vital Sign    Worried About Running Out of Food in the Last  Year: Never true    Ran Out of Food in the Last Year: Never true  Transportation Needs: No Transportation Needs (02/16/2023)   PRAPARE - Administrator, Civil Service (Medical): No    Lack of Transportation (Non-Medical): No  Physical Activity: Sufficiently Active (02/16/2023)   Exercise Vital Sign    Days of Exercise per Week: 5 days    Minutes of Exercise per Session: 30 min  Stress: No Stress Concern Present (02/16/2023)   [employer] of Occupational Health - Occupational Stress Questionnaire    Feeling of Stress : Not at all  Social Connections: Not on file    MEDICATIONS:  Current Outpatient Medications  Medication Sig Dispense Refill   Alcohol Swabs (ALCOHOL PADS) 70 % PADS USE 4 PER DAY 400 each 3   alendronate (FOSAMAX) 70 MG tablet TAKE 1 TABLET BY MOUTH EVERY 7 DAYS. TAKE WITH A FULL GLASS OF WATER ON AN EMPTY STOMACH. 4 tablet 11   ammonium lactate (AMLACTIN) 12 % cream Apply 1 Application topically as needed for dry skin. 385 g 0   Ascorbic Acid (VITAMIN C) 100 MG tablet Take 500 mg by mouth daily.     aspirin 81 MG tablet Take 1 tablet (81 mg total) by mouth daily.     BD  PEN NEEDLE NANO U/F 32G X 4 MM MISC USE AS DIRECTED FIVE TIMES A DAY. 200 each 5   brimonidine (ALPHAGAN P) 0.1 % SOLN Place 1 drop into the left eye 2 (two) times daily.     Continuous Blood Gluc Receiver (FREESTYLE LIBRE 14 DAY READER) DEVI USE DAILY TO CHECK BLOOD SUGAR 1 each 0   Continuous Glucose Sensor (FREESTYLE LIBRE 2 SENSOR) MISC APPLY TO ARM EVERY 14 DAYS TO MONITOR BLOOD SUGAR. 2 each 3   cycloSPORINE (RESTASIS) 0.05 % ophthalmic emulsion 1 drop 2 (two) times daily.     dorzolamide-timolol (COSOPT) 2-0.5 % ophthalmic solution Place 1 drop into both eyes 2 (two) times daily.     dorzolamidel-timolol (COSOPT) 22.3-6.8 MG/ML SOLN ophthalmic solution Place 1 drop into the left eye 2 (two) times daily.     furosemide (LASIX) 20 MG tablet Take 1 tablet by mouth every other day. 45 tablet 1   gabapentin (NEURONTIN) 100 MG capsule TAKE 4 CAPSULES (400 MG TOTAL) BY MOUTH AT BEDTIME. 120 capsule 3   Glucagon (GVOKE HYPOPEN 1-PACK) 1 MG/0.2ML SOAJ Inject 1 mg into the skin as needed (low blood sugar with impaired consciousness). 0.4 mL 2   glucose blood (ONETOUCH ULTRA) test strip Use to check blood sugar once a day if needed 50 each 2   insulin lispro (HUMALOG) 100 UNIT/ML KwikPen INJECT 3 TO 5 UNITS SUBCUTANEOUSLY BEFORE MEALS AS DIRECTED. 15 mL 11   losartan (COZAAR) 50 MG tablet TAKE 1 TABLET BY MOUTH DAILY. 90 tablet 1   lovastatin (MEVACOR) 40 MG tablet TAKE 1 TABLET BY MOUTH ONCE DAILY WITH A MEAL. 90 tablet 4   Multiple Vitamins-Minerals (PRESERVISION AREDS 2) CAPS Take 1 capsule by mouth 2 (two) times daily.     Omega-3 1000 MG CAPS Take 1 capsule by mouth 4 (four) times daily.     ONETOUCH VERIO test strip USE TO TEST BLOOD SUGAR 4 TIMES DAILY 150 strip 2   pantoprazole (PROTONIX) 40 MG tablet Take 40 mg by mouth 2 (two) times daily as needed (indigestion).     potassium chloride SA (KLOR-CON M) 20 MEQ tablet TAKE 1 TABLET BY MOUTH DAILY AS  NEEDED. TAKE WITH WATER PILL- LASIX. 30 tablet  1   senna-docusate (SENOKOT-S) 8.6-50 MG tablet Take 1 tablet by mouth 2 (two) times daily. While on narcotics     TRESIBA FLEXTOUCH 100 UNIT/ML FlexTouch Pen INJECT 0.11 MLS (11 UNITS TOTAL) INTO THE SKIN DAILY. (Patient taking differently: Inject 10 Units into the skin daily.) 15 mL 3   Vitamin D, Ergocalciferol, (DRISDOL) 1.25 MG (50000 UNIT) CAPS capsule TAKE 1 CAPSULE BY MOUTH ONCE A MONTH. 12 capsule 0   levothyroxine (SYNTHROID) 100 MCG tablet Take 1 tablet (100 mcg total) by mouth daily before breakfast. 30 tablet 3   No current facility-administered medications for this visit.    PHYSICAL EXAM: Vitals:   08/18/23 1506  BP: (!) 130/58  Pulse: 68  Resp: 20  SpO2: 99%  Weight: 133 lb 3.2 oz (60.4 kg)  Height: 4\' 11"  (1.499 m)    Body mass index is 26.9 kg/m.  Wt Readings from Last 3 Encounters:  08/18/23 133 lb 3.2 oz (60.4 kg)  07/12/23 135 lb (61.2 kg)  06/21/23 135 lb 3.2 oz (61.3 kg)    General: Well developed, well nourished female in no apparent distress.  HEENT: AT/[place], no external lesions.  Eyes: Conjunctiva clear and no icterus. Neck: Neck supple  Lungs: Respirations not labored Neurologic: Alert, oriented, normal speech Extremities / Skin: Dry.   Psychiatric: Does not appear depressed or anxious  Diabetic Foot Exam - Simple   No data filed     LABS Reviewed Lab Results  Component Value Date   HGBA1C 8.2 (A) 08/18/2023   HGBA1C 7.8 (A) 05/10/2023   HGBA1C 7.6 (A) 02/08/2023   No results found for: "FRUCTOSAMINE" Lab Results  Component Value Date   CHOL 140 05/26/2023   HDL 48.30 05/26/2023   LDLCALC 75 05/26/2023   TRIG 84.0 05/26/2023   CHOLHDL 3 05/26/2023   Lab Results  Component Value Date   MICRALBCREAT 3.4 05/10/2023   MICRALBCREAT 4.4 05/18/2022   Lab Results  Component Value Date   CREATININE 0.80 05/26/2023   Lab Results  Component Value Date   GFR 66.94 05/26/2023    ASSESSMENT / PLAN  1. Uncontrolled type 2 diabetes  mellitus with hyperglycemia, with long-term current use of insulin (HCC)   2. Acquired autoimmune hypothyroidism   3. Osteopenia of neck of femur, unspecified laterality      Diabetes Mellitus type 2, complicated by peripheral neuropathy. - Diabetic status / severity: Fair control however with hypo and hyperglycemia.  Labile blood sugars.  Lab Results  Component Value Date   HGBA1C 8.2 (A) 08/18/2023    - Hemoglobin [name] goal : <7.5%  Will try to keep permissive hyperglycemia/mild hyperglycemia to avoid hypoglycemia.  - Medications: see below.  Diabetes regimen Decrease Tresiba to 8 units daily.  Due to risks and fear of hypoglycemia. Humalog 2-4 units with meals 3 times a day.  Discussed that okay to have blood sugar up to 200s after meals or sugary snacks.  Okay to keep bedtime sugar in 200s range. If glucose at bedtime < 150, eat bedtime snack.  Patient has iLet [employer], however hesitant to use it, as patient is not able to handle.  Discussed that this is a problem that require minimal patient's input.  Discussed that due to the variability of blood sugar with the risks of and frequent hypoglycemia I would recommend [employer]Let [employer] insulin pump.  In any case patient and family are not ready to start insulin pump until  holidays which means until the new year/Christmas time.  Patient will continue on multidose regimen as mentioned above.  Will revisit on restarting insulin pump in the follow-up visit.  Patient is asked to call our clinic when dilated for insulin pump and will set up diabetic educator training.   - Home glucose testing: Continue freestyle libre 2 and check as needed. - Discussed/ Gave Hypoglycemia treatment plan.  Advised to use glucose tablet to correct hypoglycemia.  # Consult : not required at this time.   # Annual urine for microalbuminuria/ creatinine ratio, no microalbuminuria currently, continue ACE/ARB /losartan. Last  Lab Results  Component  Value Date   MICRALBCREAT 3.4 05/10/2023    # Foot check nightly / neuropathy, continue gabapentin.  # Annual dilated diabetic eye exams.   - Diet: Make healthy diabetic food choices - Life style / activity / exercise: Discussed.  2. Blood pressure  -  BP Readings from Last 1 Encounters:  08/18/23 (!) 130/58    - Control is in target.  - No change in current plans.  3. Lipid status / Hyperlipidemia - Last  Lab Results  Component Value Date   LDLCALC 75 05/26/2023   - Continue lovastatin 40 mg daily.  # Primary hypothyroidism. -Currently taking levothyroxine 100 mcg daily.   -Will check thyroid function test today.   # Osteopenia -Continue Fosamax 70 mg weekly. -Continue vitamin D and calcium supplement. -Discussed about fall precautions.   Diagnoses and all orders for this visit:  Uncontrolled type 2 diabetes mellitus with hyperglycemia, with long-term current use of insulin (HCC) -     POCT glycosylated hemoglobin (Hb [name])  Acquired autoimmune hypothyroidism -     T4, free; Future -     TSH; Future -     TSH -     T4, free  Osteopenia of neck of femur, unspecified laterality     DISPOSITION Follow up in clinic in 3 month suggested.  Patient and daughter want to make follow-up in coordination with other doctors appointment.   All questions answered and patient verbalized understanding of the plan.  [place] Lynne Takemoto, MD [facility] [address] [place], [address] [place], [place] [address] Phone # [phone]  At least part of this note was generated using voice recognition software. Inadvertent word errors may have occurred, which were not recognized during the proofreading process.

## 2023-08-18 NOTE — Patient Instructions (Signed)
Diabetes regimen Decrease Tresiba to 8 units daily. Humalog 2 to 4 units with meals 3 times a day.  Okay to keep bedtime sugar in 200s range. If glucose at bedtime < 150, eat bedtime snack.  When you are ready for insulin pump call our clinic, will set pump training.

## 2023-08-19 ENCOUNTER — Encounter: Payer: Self-pay | Admitting: Endocrinology

## 2023-08-19 LAB — TSH: TSH: 5.21 u[IU]/mL (ref 0.35–5.50)

## 2023-08-19 LAB — T4, FREE: Free T4: 0.96 ng/dL (ref 0.60–1.60)

## 2023-08-20 ENCOUNTER — Encounter: Payer: Self-pay | Admitting: Endocrinology

## 2023-08-26 ENCOUNTER — Telehealth: Payer: Self-pay | Admitting: Dietician

## 2023-08-26 ENCOUNTER — Other Ambulatory Visit: Payer: Self-pay

## 2023-08-26 DIAGNOSIS — E1165 Type 2 diabetes mellitus with hyperglycemia: Secondary | ICD-10-CM

## 2023-08-26 MED ORDER — FREESTYLE LIBRE 2 SENSOR MISC: 3 refills | Status: DC

## 2023-08-26 NOTE — Telephone Encounter (Signed)
Required infor (2 chart notes), c-peptide and concurrent glucose faxed to [facility] re:  iLet Pancrease insulin pump.  Oran Rein, RD, LDN, CDCES

## 2023-09-24 ENCOUNTER — Other Ambulatory Visit: Payer: Self-pay

## 2023-09-24 DIAGNOSIS — M85859 Other specified disorders of bone density and structure, unspecified thigh: Secondary | ICD-10-CM

## 2023-09-24 DIAGNOSIS — E1165 Type 2 diabetes mellitus with hyperglycemia: Secondary | ICD-10-CM

## 2023-09-24 MED ORDER — ALENDRONATE SODIUM 70 MG PO TABS: ORAL_TABLET | ORAL | 11 refills | Status: DC

## 2023-09-24 MED ORDER — GABAPENTIN 100 MG PO CAPS: 400.0000 mg | ORAL_CAPSULE | Freq: Every day | ORAL | 3 refills | Status: DC

## 2023-09-24 MED ORDER — LEVOTHYROXINE SODIUM 100 MCG PO TABS: 100.0000 ug | ORAL_TABLET | Freq: Every day | ORAL | 3 refills | Status: DC

## 2023-09-24 NOTE — Telephone Encounter (Signed)
FOSAMAX AND SYNTHROID REFILL REQUEST COMP LETE.

## 2023-10-06 ENCOUNTER — Emergency Department (HOSPITAL_COMMUNITY): Payer: No Typology Code available for payment source

## 2023-10-06 ENCOUNTER — Emergency Department (HOSPITAL_COMMUNITY)
Admission: EM | Admit: 2023-10-06 | Discharge: 2023-10-06 | Disposition: A | Discharge status: Home / Self Care | Payer: No Typology Code available for payment source | Attending: Emergency Medicine | Admitting: Emergency Medicine

## 2023-10-06 ENCOUNTER — Encounter (HOSPITAL_COMMUNITY): Payer: Self-pay

## 2023-10-06 ENCOUNTER — Other Ambulatory Visit: Payer: Self-pay

## 2023-10-06 DIAGNOSIS — M79671 Pain in right foot: Secondary | ICD-10-CM | POA: Diagnosis present

## 2023-10-06 DIAGNOSIS — R7309 Other abnormal glucose: Secondary | ICD-10-CM | POA: Insufficient documentation

## 2023-10-06 DIAGNOSIS — W19XXXA Unspecified fall, initial encounter: Secondary | ICD-10-CM | POA: Insufficient documentation

## 2023-10-06 DIAGNOSIS — Z7982 Long term (current) use of aspirin: Secondary | ICD-10-CM | POA: Insufficient documentation

## 2023-10-06 LAB — CBG MONITORING, ED: Glucose-Capillary: 294 mg/dL — ABNORMAL HIGH (ref 70–99)

## 2023-10-06 MED ORDER — ACETAMINOPHEN 500 MG PO TABS
1000.0000 mg | ORAL_TABLET | Freq: Once | ORAL | Status: DC
  Filled 2023-10-06: qty 2

## 2023-10-06 NOTE — ED Notes (Signed)
 Patient discharged by this RN. Patient verbalizes understanding and going home with daughters at bedside.

## 2023-10-06 NOTE — Progress Notes (Signed)
 Orthopedic Tech Progress Note Patient Details:  Jennifer Bailey [birth year] [id number]  Ortho Devices Type of Ortho Device: CAM walker Ortho Device/Splint Location: RLE Ortho Device/Splint Interventions: Ordered, Application, Adjustment   Post Interventions Patient Tolerated: Well Instructions Provided: Adjustment of device, Care of device  Jennifer Bailey 10/06/2023, 11:10 AM

## 2023-10-06 NOTE — ED Triage Notes (Addendum)
 Patient had unwitnessed fall at home, lives alone, unknown LOC and downtime. Patient endorses sitting at dining room table when she fell into the floor but doesn't remember after that. No head pain, c-collar in place, injury to right leg below knee, no other injuries or deformities noted. No thinners. Family and neighbors found patient. Hx of dementia, alert and oriented x3.  VSS BP 151/58 HR 78 O2 100 room air CBG 288

## 2023-10-06 NOTE — ED Notes (Signed)
 Patient transported to X-ray

## 2023-10-06 NOTE — ED Provider Notes (Signed)
 [place] EMERGENCY DEPARTMENT AT [facility] Provider Note   CSN: [id number] Arrival date & time: 10/06/23  0844     History  Chief Complaint  Patient presents with   [place]    Jennifer Bailey is a 89 y.o. female.  89 yo F with a chief complaints of fall.  The patient states that she was sitting at her table this morning and she went to get up and she lost her balance and fell.  She thinks she hurt her right lower leg.  She denies head injury denies loss of consciousness denies chest pain back pain abdominal pain.   Fall       Home Medications Prior to Admission medications   Medication Sig Start Date End Date Taking? Authorizing Provider  Alcohol  Swabs  (ALCOHOL  PADS) 70 % PADS USE 4 PER DAY 11/14/14   Von Pacific, MD  alendronate  (FOSAMAX ) 70 MG tablet Take with a full glass of water on an empty stomach. 09/24/23   Thapa, Sudan, MD  ammonium lactate  (AMLACTIN) 12 % cream Apply 1 Application topically as needed for dry skin. 07/23/22   McDonald, Juliene SAUNDERS, DPM  Ascorbic Acid (VITAMIN C) 100 MG tablet Take 500 mg by mouth daily.    [provider]  aspirin  81 MG tablet Take 1 tablet (81 mg total) by mouth daily. 01/05/21   Pokhrel, Laxman, MD  BD PEN NEEDLE NANO U/F 32G X 4 MM MISC USE AS DIRECTED FIVE TIMES A DAY. 03/03/16   Von Pacific, MD  brimonidine  (ALPHAGAN  P) 0.1 % SOLN Place 1 drop into the left eye 2 (two) times daily.    [provider]  Continuous Blood Gluc Receiver (FREESTYLE LIBRE 14 DAY READER) DEVI USE DAILY TO CHECK BLOOD SUGAR 09/24/20   Von Pacific, MD  Continuous Glucose Sensor (FREESTYLE LIBRE 2 SENSOR) MISC APPLY TO ARM EVERY 14 DAYS TO MONITOR BLOOD SUGAR. 08/26/23   Thapa, Sudan, MD  cycloSPORINE  (RESTASIS ) 0.05 % ophthalmic emulsion 1 drop 2 (two) times daily.    [provider]  dorzolamide -timolol  (COSOPT ) 2-0.5 % ophthalmic solution Place 1 drop into both eyes 2 (two) times daily. 04/28/23   [provider]   dorzolamidel-timolol  (COSOPT ) 22.3-6.8 MG/ML SOLN ophthalmic solution Place 1 drop into the left eye 2 (two) times daily.    [provider]  furosemide  (LASIX ) 20 MG tablet Take 1 tablet by mouth every other day. 01/27/22   Abonza, Maritza, PA-C  gabapentin  (NEURONTIN ) 100 MG capsule Take 4 capsules (400 mg total) by mouth at bedtime. 09/24/23   Thapa, Sudan, MD  Glucagon  (GVOKE HYPOPEN  1-PACK) 1 MG/0.2ML SOAJ Inject 1 mg into the skin as needed (low blood sugar with impaired consciousness). 06/21/23   Thapa, Sudan, MD  glucose blood (ONETOUCH ULTRA) test strip Use to check blood sugar once a day if needed 03/04/22   Von Pacific, MD  insulin  lispro (HUMALOG ) 100 UNIT/ML KwikPen INJECT 3 TO 5 UNITS SUBCUTANEOUSLY BEFORE MEALS AS DIRECTED. 02/02/23   Von Pacific, MD  levothyroxine  (SYNTHROID ) 100 MCG tablet Take 1 tablet (100 mcg total) by mouth daily before breakfast. 09/24/23 10/24/23  Thapa, Sudan, MD  losartan  (COZAAR ) 50 MG tablet TAKE 1 TABLET BY MOUTH DAILY. 05/21/23   Von Pacific, MD  lovastatin  (MEVACOR ) 40 MG tablet TAKE 1 TABLET BY MOUTH ONCE DAILY WITH A MEAL. 09/21/22   Von Pacific, MD  Multiple Vitamins-Minerals (PRESERVISION AREDS 2) CAPS Take 1 capsule by mouth 2 (two) times daily.  [provider]  Omega-3 1000 MG CAPS Take 1 capsule by mouth 4 (four) times daily.    [provider]  [facility] VERIO test strip USE TO TEST BLOOD SUGAR 4 TIMES DAILY 09/25/20   Von Pacific, MD  pantoprazole (PROTONIX) 40 MG tablet Take 40 mg by mouth 2 (two) times daily as needed (indigestion).    [provider]  potassium chloride  SA (KLOR-CON  M) 20 MEQ tablet TAKE 1 TABLET BY MOUTH DAILY AS NEEDED. TAKE WITH WATER PILL- LASIX . 04/13/22   Abonza, Maritza, PA-C  senna-docusate (SENOKOT-S) 8.6-50 MG tablet Take 1 tablet by mouth 2 (two) times daily. While on narcotics 12/10/20   Pokhrel, Laxman, MD  TRESIBA  FLEXTOUCH 100 UNIT/ML FlexTouch Pen INJECT 0.11 MLS (11 UNITS TOTAL)  INTO THE SKIN DAILY. Patient taking differently: Inject 10 Units into the skin daily. 04/02/23   Von Pacific, MD  Vitamin D , Ergocalciferol , (DRISDOL) 1.25 MG (50000 UNIT) CAPS capsule TAKE 1 CAPSULE BY MOUTH ONCE A MONTH. 10/23/21   Shamleffer, Ibtehal Jaralla, MD      Allergies    Macrodantin [nitrofurantoin macrocrystal], Prednisone, and Penicillins    Review of Systems   Review of Systems  Physical Exam Updated Vital Signs BP (!) 142/65   Pulse 77   Temp 97.6 F (36.4 C) (Oral)   Resp 16   Ht 4' 10 (1.473 m)   Wt 61.2 kg   SpO2 100%   BMI 28.22 kg/m  Physical Exam Vitals and nursing note reviewed.  Constitutional:      General: She is not in acute distress.    Appearance: She is well-developed. She is not diaphoretic.  HENT:     Head: Normocephalic and atraumatic.  Eyes:     Pupils: Pupils are equal, round, and reactive to light.  Cardiovascular:     Rate and Rhythm: Normal rate and regular rhythm.     Heart sounds: No murmur heard.    No friction rub. No gallop.  Pulmonary:     Effort: Pulmonary effort is normal.     Breath sounds: No wheezing or rales.  Abdominal:     General: There is no distension.     Palpations: Abdomen is soft.     Tenderness: There is no abdominal tenderness.  Musculoskeletal:        General: Swelling and tenderness present.     Cervical back: Normal range of motion and neck supple.     Comments: Pain and swelling to the right lower leg.  Some pain and swelling to the ankle.  No obvious pain in the foot.  Intact pulse motor and sensation.  No obvious pain at the right hip.  Palpated from head to toe without other obvious noted areas of bony tenderness.  Skin:    General: Skin is warm and dry.  Neurological:     Mental Status: She is alert and oriented to person, place, and time.  Psychiatric:        Behavior: Behavior normal.     ED Results / Procedures / Treatments   Labs (all labs ordered are listed, but only abnormal results  are displayed) Labs Reviewed  CBG MONITORING, ED - Abnormal; Notable for the following components:      Result Value   Glucose-Capillary 294 (*)    All other components within normal limits    EKG None  Radiology DG Tibia/Fibula Right Result Date: 10/06/2023 CLINICAL DATA:  Right lower leg post fall from sitting position. EXAM: RIGHT TIBIA AND  FIBULA - 2 VIEW COMPARISON:  12/04/2020 and 12/02/2020 FINDINGS: Examination demonstrates anterolateral fixation plate with associated screws over the proximal tibia intact and unchanged. Evidence of old proximal tibial and fibular fractures. No acute fracture or dislocation. Remainder of the exam is unremarkable. IMPRESSION: 1. No acute findings. 2. Evidence of old proximal tibial fracture post fixation with hardware intact. Old proximal fibular fracture. Electronically Signed   By: Toribio Agreste M.D.   On: 10/06/2023 10:18   DG Foot Complete Right Result Date: 10/06/2023 CLINICAL DATA:  Foot pain after a fall. EXAM: RIGHT FOOT COMPLETE - 3+ VIEW COMPARISON:  Same day ankle radiographs. FINDINGS: There is no evidence of fracture or dislocation. There is no evidence of fracture, dislocation, or joint effusion. A plantar calcaneal enthesophyte is noted. There is soft tissue swelling around the ankle. IMPRESSION: No acute fracture or dislocation. Electronically Signed   By: Norman Hopper M.D.   On: 10/06/2023 10:17   DG Ankle Complete Right Result Date: 10/06/2023 CLINICAL DATA:  Ankle pain after a fall. EXAM: RIGHT ANKLE - COMPLETE 3+ VIEW COMPARISON:  Same day foot radiographs. FINDINGS: There is no evidence of fracture, dislocation, or joint effusion. A plantar calcaneal enthesophyte is noted. There is soft tissue swelling around the ankle. IMPRESSION: No acute fracture or dislocation. Soft tissue swelling around the ankle. Electronically Signed   By: Norman Hopper M.D.   On: 10/06/2023 10:17    Procedures Procedures    Medications Ordered in  ED Medications  acetaminophen  (TYLENOL ) tablet 1,000 mg (1,000 mg Oral Patient Refused/Not Given 10/06/23 9043)    ED Course/ Medical Decision Making/ A&P                                 Medical Decision Making Amount and/or Complexity of Data Reviewed Radiology: ordered.  Risk OTC drugs.   30 yoF with a chief complaint of a fall.  Nonsyncopal by history complaining mostly of right lower leg pain.  Will obtain plain films.  Plain film of the right tib-fib right ankle and right foot independently interpreted by me without fracture.  She does have hardware from a prior tibial plateau fracture.  I do not see any hardware malfunction.  She was unable to ambulate initially.  [place] it was too painful to stand on her foot.  She was placed in a cam boot and is able to ambulate here.  She does have a cane and walker at home.  Will discharge the patient home.  PCP follow-up.  12:07 PM:  I have discussed the diagnosis/risks/treatment options with the patient.  Evaluation and diagnostic testing in the emergency department does not suggest an emergent condition requiring admission or immediate intervention beyond what has been performed at this time.  They will follow up with PCP. We also discussed returning to the ED immediately if new or worsening sx occur. We discussed the sx which are most concerning (e.g., sudden worsening pain, fever, inability to tolerate by mouth) that necessitate immediate return. Medications administered to the patient during their visit and any new prescriptions provided to the patient are listed below.  Medications given during this visit Medications  acetaminophen  (TYLENOL ) tablet 1,000 mg (1,000 mg Oral Patient Refused/Not Given 10/06/23 0956)     The patient appears reasonably screen and/or stabilized for discharge and I doubt any other medical condition or other [facility] requiring further screening, evaluation, or treatment in the ED at this  time prior to discharge.           Final Clinical Impression(s) / ED Diagnoses Final diagnoses:  Right foot pain    Rx / DC Orders ED Discharge Orders     None         Emil Share, DO 10/06/23 1207

## 2023-10-06 NOTE — ED Notes (Addendum)
 Patient ambulated by this RN with one person assist. Patient in CAM boot stating it is much easier to walk with boot on. Patient endorses having a cane and walker at home that she can use. MD notified.

## 2023-10-06 NOTE — Discharge Instructions (Signed)
 Your x-rays look good.  Perhaps you sprained your ankle or your foot.  Wear the boot as needed to help you at home.  Please follow-up with your family doctor in the office.

## 2023-10-11 ENCOUNTER — Ambulatory Visit (INDEPENDENT_AMBULATORY_CARE_PROVIDER_SITE_OTHER): Payer: Medicare PPO | Admitting: Nurse Practitioner

## 2023-10-11 ENCOUNTER — Encounter: Payer: Self-pay | Admitting: Nurse Practitioner

## 2023-10-11 VITALS — BP 120/70 | HR 76

## 2023-10-11 DIAGNOSIS — I152 Hypertension secondary to endocrine disorders: Secondary | ICD-10-CM

## 2023-10-11 DIAGNOSIS — E11649 Type 2 diabetes mellitus with hypoglycemia without coma: Secondary | ICD-10-CM | POA: Diagnosis not present

## 2023-10-11 DIAGNOSIS — W19XXXA Unspecified fall, initial encounter: Secondary | ICD-10-CM

## 2023-10-11 DIAGNOSIS — F03A0 Unspecified dementia, mild, without behavioral disturbance, psychotic disturbance, mood disturbance, and anxiety: Secondary | ICD-10-CM

## 2023-10-11 DIAGNOSIS — Z794 Long term (current) use of insulin: Secondary | ICD-10-CM | POA: Diagnosis not present

## 2023-10-11 DIAGNOSIS — E1159 Type 2 diabetes mellitus with other circulatory complications: Secondary | ICD-10-CM

## 2023-10-11 DIAGNOSIS — Y92009 Unspecified place in unspecified non-institutional (private) residence as the place of occurrence of the external cause: Secondary | ICD-10-CM

## 2023-10-11 DIAGNOSIS — M7731 Calcaneal spur, right foot: Secondary | ICD-10-CM

## 2023-10-11 DIAGNOSIS — F320 Major depressive disorder, single episode, mild: Secondary | ICD-10-CM

## 2023-10-11 DIAGNOSIS — L821 Other seborrheic keratosis: Secondary | ICD-10-CM

## 2023-10-11 DIAGNOSIS — E1142 Type 2 diabetes mellitus with diabetic polyneuropathy: Secondary | ICD-10-CM

## 2023-10-11 DIAGNOSIS — S80819A Abrasion, unspecified lower leg, initial encounter: Secondary | ICD-10-CM | POA: Diagnosis not present

## 2023-10-11 LAB — GLUCOSE, POCT (MANUAL RESULT ENTRY): POC Glucose: 359 mg/dL — AB (ref 70–99)

## 2023-10-11 NOTE — Patient Instructions (Addendum)
 I would like you to increase the Tresiba  to 11 units daily. In 3 days if the blood sugars are staying higher than 200 on average, I would like you to increase up to 12 units.    Continue to give the insulin  before meals as you have been- up to 6 units as directed. I will send you a sliding scale to give specific instructions.   You do not need to wear the boot anymore, but ice the bottom of the foot with a frozen water bottle to help with swelling and pain. If this continues to bother you, please let me know and we can send a referral to the foot doctor.   I will see if your insurance coverage will cover the Nowthen 3 plus, which is a better, for the continuous blood sugar reading.    SLIDING SCALE INSULIN    Blood Sugar   Breakfast and Lunch  Dinner Less than 150   No Insulin    No Insulin  150-199   2 Units    1 Unit 200-249   4 Units    2 Units 250-299   6 Units    4 Units 300-349   8 Units    6 Units 350-399   10 Units   8 Units More than 400   Give 10 units then recheck in 2 hours, if still greater than 200, give additional 2 units and notify office

## 2023-10-11 NOTE — Progress Notes (Signed)
 Jennifer Doing, DNP, AGNP-c [facility] [address] [place], [place] [address] Main Office [phone]  [facility]  Blood pressure 120/70, pulse 76.  HPI  Jennifer Bailey  is a 89 y.o. year old female presenting today for evaluation and management following hospitalization.   Date of Hospitalization: 10/06/2023 Admitted: No Primary Diagnosis: Fall, Foot injury (right) New Medications: No D/C'd Chronic Medications: No New Providers: No Speciality F/U Scheduled: No Lab/Imaging Concerns that need further evaluation: No  Xian tells me today: She reported persistent swelling in the leg since a previous fracture, making it difficult to discern if the recent fall exacerbated the swelling. She also reports heel pain, which was attributed to a bone spur identified on imaging. She reported difficulty in wearing the supportive boot provided due to inability to put it on independently and skin abrasions it has caused on the shin. At this time she does not feel that the boot is necessary for comfort as she was without it all day yesterday and had no additional pain ambulating.   Her diabetes management was also discussed. Her blood sugar levels have been consistently high, with readings often exceeding 350. Her insulin  dosage was recently reduced by endocrinology,  but due to persistently high blood sugar levels, the dosage was increased again independently. She uses a continuous glucose monitor, but there were concerns about the accuracy of the readings. However, we found that the levels were accurate with a spot check finger stick in the office today.   She also reported having multiple seborrheic keratoses on her back, which were confirmed to be benign. She lives alone and has been managing her diabetes with the help of family members. Her diabetes management has been challenging due to the complexity of insulin  dosing and the need for  frequent blood sugar monitoring. They have asked today that I take over management as they have had difficulty understanding directions from endocrinology and they are concerned this has led to worsening control.  ROS All ROS negative with exception of what is listed in HPI  PHYSICAL EXAM Physical Exam Vitals and nursing note reviewed.  Constitutional:      General: She is not in acute distress.    Appearance: Normal appearance.  HENT:     Head: Normocephalic.  Eyes:     Conjunctiva/sclera: Conjunctivae normal.  Cardiovascular:     Rate and Rhythm: Normal rate and regular rhythm.     Pulses: Normal pulses.     Heart sounds: Normal heart sounds.  Pulmonary:     Effort: Pulmonary effort is normal.     Breath sounds: Normal breath sounds.  Musculoskeletal:        General: Swelling present.     Right lower leg: Edema present.     Left lower leg: Edema present.  Skin:    General: Skin is warm and dry.     Capillary Refill: Capillary refill takes 2 to 3 seconds.       Neurological:     Mental Status: She is alert and oriented to person, place, and time.     Sensory: No sensory deficit.     Motor: Weakness present.  Psychiatric:        Mood and Affect: Mood normal.        Behavior: Behavior normal.      ASSESSMENT & PLAN Problem List Items Addressed This Visit     Type 2 diabetes mellitus with hypoglycemia without coma, with long-term current use of  insulin  (HCC)   Poorly controlled diabetes with recent high blood glucose readings. [name] in November was 8.2. Concerns about managing an insulin  pump due to living alone and difficulty with electronic devices. Discussed risks of hypoglycemia with insulin  pump use in elderly patients and preference for maintaining higher glucose levels to avoid severe hypoglycemia. Recommended against insulin  pump use and suggested optimizing current insulin  regimen. - Increase Tresiba  to 11 units daily for 3 days, then to 12 units if blood glucose  remains >200 - Continue meal insulin  dosing with adjustments as needed - Send sliding scale instructions via email - Consider newer continuous glucose monitor model if insurance covers it - Discontinue plans for insulin  pump - Follow up in February for [name] and further adjustments       Relevant Orders   Glucose (CBG) (Completed)   Hypertension associated with diabetes (HCC)   BP stable.       Diabetic polyneuropathy associated with type 2 diabetes mellitus (HCC)   Close monitoring of BG discussed to prevent worsening. Sensation intact at area of abrasion.       Fall at home, initial encounter - Primary   Fall resulting in injury and swelling to the right ankle. Able to bear weight with use of cane. No further injury or debility noted. OK to stop using the boot.       Depression, major, single episode, mild (HCC)   Mood appears stable at this time with no alarm symptoms present. Continue to monitor.       Dementia without behavioral disturbance, psychotic disturbance, mood disturbance, or anxiety (HCC)   Currently able to live at home with assistance from daughters who live close by. No concerns at this time.       Abrasion of anterior lower leg, initial encounter   A light wrap with gauze was applied to the foot to protect the area from rubbing against the boot. Antibiotic ointment was applied to the affected area. - Monitor closely for signs of infection      Seborrheic keratoses   Multiple seborrheic keratoses on the back. Benign and not causing significant issues. Explained these are normal age-related changes and not harmful. - Reassure these are normal age-related changes - Advise to monitor for any changes or irritation      Calcaneal spur of right foot   Chronic heel spur exacerbated by recent fall. Imaging shows no fracture but presence of a heel spur. The boot is causing discomfort and skin irritation. Discussed that the boot is unnecessary for the heel spur and  that surgical removal is not preferred at this time. Recommended conservative management. - Discontinue boot - Apply light wrap with gauze to protect skin - Recommend icing with a frozen water bottle for 20 minutes twice daily - Consider podiatrist referral if symptoms persist       FOLLOW-UP Already scheduled   Jennifer Niurka Benecke, DNP, AGNP-c   History, Medications, Surgery, SDOH, and Family History reviewed and updated as appropriate.

## 2023-10-13 ENCOUNTER — Other Ambulatory Visit: Payer: Self-pay | Admitting: Nurse Practitioner

## 2023-10-15 ENCOUNTER — Encounter: Payer: Self-pay | Admitting: Nurse Practitioner

## 2023-10-15 DIAGNOSIS — L821 Other seborrheic keratosis: Secondary | ICD-10-CM | POA: Insufficient documentation

## 2023-10-15 DIAGNOSIS — M7731 Calcaneal spur, right foot: Secondary | ICD-10-CM | POA: Insufficient documentation

## 2023-10-15 DIAGNOSIS — S80819A Abrasion, unspecified lower leg, initial encounter: Secondary | ICD-10-CM | POA: Insufficient documentation

## 2023-10-15 NOTE — Assessment & Plan Note (Signed)
 A light wrap with gauze was applied to the foot to protect the area from rubbing against the boot. Antibiotic ointment was applied to the affected area. - Monitor closely for signs of infection

## 2023-10-15 NOTE — Assessment & Plan Note (Signed)
 Multiple seborrheic keratoses on the back. Benign and not causing significant issues. Explained these are normal age-related changes and not harmful. - Reassure these are normal age-related changes - Advise to monitor for any changes or irritation

## 2023-10-15 NOTE — Assessment & Plan Note (Signed)
 Mood appears stable at this time with no alarm symptoms present. Continue to monitor.

## 2023-10-15 NOTE — Assessment & Plan Note (Signed)
 Close monitoring of BG discussed to prevent worsening. Sensation intact at area of abrasion.

## 2023-10-15 NOTE — Assessment & Plan Note (Signed)
 Poorly controlled diabetes with recent high blood glucose readings. [name] in November was 8.2. Concerns about managing an insulin  pump due to living alone and difficulty with electronic devices. Discussed risks of hypoglycemia with insulin  pump use in elderly patients and preference for maintaining higher glucose levels to avoid severe hypoglycemia. Recommended against insulin  pump use and suggested optimizing current insulin  regimen. - Increase Tresiba  to 11 units daily for 3 days, then to 12 units if blood glucose remains >200 - Continue meal insulin  dosing with adjustments as needed - Send sliding scale instructions via email - Consider newer continuous glucose monitor model if insurance covers it - Discontinue plans for insulin  pump - Follow up in February for [name] and further adjustments

## 2023-10-15 NOTE — Assessment & Plan Note (Signed)
 BP stable.

## 2023-10-15 NOTE — Assessment & Plan Note (Signed)
 Currently able to live at home with assistance from daughters who live close by. No concerns at this time.

## 2023-10-15 NOTE — Assessment & Plan Note (Signed)
 Fall resulting in injury and swelling to the right ankle. Able to bear weight with use of cane. No further injury or debility noted. OK to stop using the boot.

## 2023-10-15 NOTE — Assessment & Plan Note (Signed)
 Chronic heel spur exacerbated by recent fall. Imaging shows no fracture but presence of a heel spur. The boot is causing discomfort and skin irritation. Discussed that the boot is unnecessary for the heel spur and that surgical removal is not preferred at this time. Recommended conservative management. - Discontinue boot - Apply light wrap with gauze to protect skin - Recommend icing with a frozen water bottle for 20 minutes twice daily - Consider podiatrist referral if symptoms persist

## 2023-10-20 ENCOUNTER — Telehealth: Payer: Self-pay | Admitting: Family Medicine

## 2023-10-20 DIAGNOSIS — E1165 Type 2 diabetes mellitus with hyperglycemia: Secondary | ICD-10-CM

## 2023-10-20 MED ORDER — FREESTYLE LIBRE 2 SENSOR MISC: 3 refills | Status: DC

## 2023-10-20 NOTE — Telephone Encounter (Signed)
 Sent prescription for freestyle libre 2.

## 2023-10-20 NOTE — Telephone Encounter (Signed)
 Marily Shows (dtr) is requesting refill of Libre sensors to [facility].  She has a new Psychologist, sport and exercise which not sure is covering.

## 2023-10-25 ENCOUNTER — Other Ambulatory Visit (HOSPITAL_COMMUNITY): Payer: Self-pay

## 2023-11-05 NOTE — Progress Notes (Unsigned)
Ask about covid/flu/tdap vaccines

## 2023-11-08 ENCOUNTER — Encounter: Payer: Self-pay | Admitting: Nurse Practitioner

## 2023-11-08 ENCOUNTER — Ambulatory Visit: Payer: Medicare PPO | Admitting: Nurse Practitioner

## 2023-11-08 VITALS — BP 144/70 | HR 55 | Wt 135.4 lb

## 2023-11-08 DIAGNOSIS — E11649 Type 2 diabetes mellitus with hypoglycemia without coma: Secondary | ICD-10-CM

## 2023-11-08 DIAGNOSIS — Z794 Long term (current) use of insulin: Secondary | ICD-10-CM | POA: Diagnosis not present

## 2023-11-08 DIAGNOSIS — Z23 Encounter for immunization: Secondary | ICD-10-CM | POA: Diagnosis not present

## 2023-11-08 MED ORDER — JANUMET 50-500 MG PO TABS: 1.0000 | ORAL_TABLET | Freq: Two times a day (BID) | ORAL | Status: DC

## 2023-11-08 NOTE — Progress Notes (Signed)
Jennifer Clamp, DNP, AGNP-c [facility]  [address] [place], [place] [address] [phone]  ESTABLISHED PATIENT- Chronic Health and/or Follow-Up Visit  Blood pressure (!) 144/70, pulse (!) 55, weight 135 lb 6.4 oz (61.4 kg).    Jennifer Bailey is a 89 y.o. year old female presenting today for evaluation and management of chronic conditions.   Highs and lows with blood sugars continue.  History of Present Illness Jennifer Bailey presents with her daughter, Jennifer Bailey, today for evaluation of fluctuating blood sugar levels.  She is experiencing fluctuating blood sugar levels, with recent episodes of both hyperglycemia and hypoglycemia.  Her blood sugar levels have been inconsistent, sometimes running in the 300s, but have been lower in the past couple of days. She typically eats breakfast around 6:30-7:00 AM and lunch around noon. She is using [place] 11 units once a day and Humalog SS 3-5u three times a day before meals. She uses a continuous glucose monitor but does not always pay attention to it.    Today, her blood sugar dropped to 70 mg/dL.  After having breakfast consisting of a waffle, strawberries, and a fruit cup, she noted a drop in her blood sugar levels. She reports she feels it 'a little bit' when it starts to drop. The noted drop was close to a mealtime, but she had not had additional insulin.     On a recent Saturday, her blood sugar was over 600 mg/dL, which was managed at home with increased insulin, water intake, and physical activity. Her daughter, Jennifer Bailey, played a significant role in managing this episode by encouraging movement and hydration and supplying her with SS insulin (humalog).  Review of her FS libre reader shows wide fluctuations in blood sugars with little consistency noted on AM, noon, PM, or overnight levels.  It does appear her PM levels are higher with higher AM levels most consistently. Hypoglycemic episodes were not present (less than 70),  which is reassuring. Spikes following AM meal were fairly common, however, there were also days with wide spike immediately post meal and then drastic drop by the 11am time frame. She does not eat any consistent meals and does not count carbohydrates.   She experienced a brief illness a few days ago with symptoms of diarrhea, sniffles, cough, and watery eyes, which resolved by the following Saturday. She drinks plenty of water and has no current issues with diarrhea.  All ROS negative with exception of what is listed above.   PHYSICAL EXAM Physical Exam Vitals and nursing note reviewed.  Constitutional:      Appearance: Normal appearance.  HENT:     Head: Normocephalic.  Eyes:     Conjunctiva/sclera: Conjunctivae normal.     Pupils: Pupils are equal, round, and reactive to light.  Cardiovascular:     Rate and Rhythm: Normal rate and regular rhythm.     Pulses: Normal pulses.     Heart sounds: Normal heart sounds.  Pulmonary:     Effort: Pulmonary effort is normal.     Breath sounds: Normal breath sounds.  Musculoskeletal:        General: Normal range of motion.     Cervical back: Normal range of motion.  Skin:    General: Skin is warm.  Neurological:     General: No focal deficit present.     Mental Status: She is alert and oriented to person, place, and time.  Psychiatric:        Mood and Affect: Mood normal.  PLAN Problem List Items Addressed This Visit     Type 2 diabetes mellitus with hypoglycemia without coma, with long-term current use of insulin (HCC) - Primary   Presents with fluctuating blood glucose levels, including recent hyperglycemia (>600 mg/dL) and hypoglycemia (70 mg/dL). Blood glucose often in the 300s. Currently on Tresiba (11 units daily) and Humalog (3-5 units PRN). Suspected missed insulin doses and/or dietary inconsistencies contributing to the high's. CGM monitoring does not show any real patterns. Discussed consistent carbohydrate intake and  protein with each meal to help reduce the wide variations. We also discussed adding metformin to stabilize glucose levels without causing hypoglycemia. I would rather have her on a regimen to help consistently keep the levels stable, particularly if her meal habits are so varied. I am afraid we will not have any luck with the insulin at stability and high risk hypoglycemia when heavy carbohydrates are consumed. Informed consent provided regarding metformin's benefits and goal (reducing/eliminating mealtime insulin). Ideally we can eliminate the mealtime SS with more stable findings. - Initiate metformin with Januvia, one dose with breakfast and one with dinner. 50-500mg . Samples provided for 2 months.  - Monitor blood glucose levels closely after starting metformin - Adjust Tresiba dosage if fasting blood sugars consistently <120 mg/dL. Drop by 1 unit daily.  - Aim to reduce/eliminate mealtime insulin if glucose levels stabilize - Provide sliding scale insulin instructions in after-visit summary- goal to eliminate the humalog - Check [name] levels - Ensure access to CGM data      Relevant Medications   sitaGLIPtin-metformin (JANUMET) 50-500 MG tablet   Other Relevant Orders   Hemoglobin [name]   CBC with Differential/Platelet   Comprehensive metabolic panel   Other Visit Diagnoses       Need for influenza vaccination       Relevant Orders   Flu Vaccine Trivalent High Dose (Fluad) (Completed)       Return in about 3 months (around 02/05/2024) for Med Management 30. Time: 45 minutes, >50% spent counseling, care coordination, chart review, and documentation.   Jennifer Clamp, DNP, AGNP-c

## 2023-11-08 NOTE — Assessment & Plan Note (Signed)
Presents with fluctuating blood glucose levels, including recent hyperglycemia (>600 mg/dL) and hypoglycemia (70 mg/dL). Blood glucose often in the 300s. Currently on Tresiba (11 units daily) and Humalog (3-5 units PRN). Suspected missed insulin doses and/or dietary inconsistencies contributing to the high's. CGM monitoring does not show any real patterns. Discussed consistent carbohydrate intake and protein with each meal to help reduce the wide variations. We also discussed adding metformin to stabilize glucose levels without causing hypoglycemia. I would rather have her on a regimen to help consistently keep the levels stable, particularly if her meal habits are so varied. I am afraid we will not have any luck with the insulin at stability and high risk hypoglycemia when heavy carbohydrates are consumed. Informed consent provided regarding metformin's benefits and goal (reducing/eliminating mealtime insulin). Ideally we can eliminate the mealtime SS with more stable findings. - Initiate metformin with Januvia, one dose with breakfast and one with dinner. 50-500mg . Samples provided for 2 months.  - Monitor blood glucose levels closely after starting metformin - Adjust Tresiba dosage if fasting blood sugars consistently <120 mg/dL. Drop by 1 unit daily.  - Aim to reduce/eliminate mealtime insulin if glucose levels stabilize - Provide sliding scale insulin instructions in after-visit summary- goal to eliminate the humalog - Check [name] levels - Ensure access to CGM data

## 2023-11-08 NOTE — Patient Instructions (Addendum)
Start the Janumet two times a day. Once with breakfast and once with Supper.   Keep a close eye on your average blood sugars when starting this. You may need to decrease the once a day insulin. You may be able to stop the meal time insulin, but maybe not.  What we want to see is lower numbers on average.   Titrate    Sliding Scale Insulin Less than 70- Eat/drink sugar then protein 70-120  NO INSULIN 120-150   1 UNIT 151-200   2 UNIT 201-250   3 UNIT 251-350   4 UNIT

## 2023-11-09 LAB — COMPREHENSIVE METABOLIC PANEL
ALT: 10 IU/L (ref 0–32)
AST: 13 IU/L (ref 0–40)
Albumin: 4 g/dL (ref 3.7–4.7)
Alkaline Phosphatase: 95 IU/L (ref 44–121)
BUN/Creatinine Ratio: 18 (ref 12–28)
BUN: 14 mg/dL (ref 8–27)
Bilirubin Total: 0.2 mg/dL (ref 0.0–1.2)
CO2: 24 mmol/L (ref 20–29)
Calcium: 9.2 mg/dL (ref 8.7–10.3)
Chloride: 107 mmol/L — ABNORMAL HIGH (ref 96–106)
Creatinine, Ser: 0.8 mg/dL (ref 0.57–1.00)
Globulin, Total: 2.2 g/dL (ref 1.5–4.5)
Glucose: 58 mg/dL — ABNORMAL LOW (ref 70–99)
Potassium: 4.3 mmol/L (ref 3.5–5.2)
Sodium: 142 mmol/L (ref 134–144)
Total Protein: 6.2 g/dL (ref 6.0–8.5)
eGFR: 72 mL/min/{1.73_m2} (ref >59)

## 2023-11-09 LAB — CBC WITH DIFFERENTIAL/PLATELET
Basophils Absolute: 0.1 10*3/uL (ref 0.0–0.2)
Basos: 1 %
EOS (ABSOLUTE): 0.6 10*3/uL — ABNORMAL HIGH (ref 0.0–0.4)
Eos: 7 %
Hematocrit: 34.7 % (ref 34.0–46.6)
Hemoglobin: 11 g/dL — ABNORMAL LOW (ref 11.1–15.9)
Immature Grans (Abs): 0 10*3/uL (ref 0.0–0.1)
Immature Granulocytes: 1 %
Lymphocytes Absolute: 2.5 10*3/uL (ref 0.7–3.1)
Lymphs: 29 %
MCH: 29.4 pg (ref 26.6–33.0)
MCHC: 31.7 g/dL (ref 31.5–35.7)
MCV: 93 fL (ref 79–97)
Monocytes Absolute: 0.6 10*3/uL (ref 0.1–0.9)
Monocytes: 7 %
Neutrophils Absolute: 4.9 10*3/uL (ref 1.4–7.0)
Neutrophils: 55 %
Platelets: 294 10*3/uL (ref 150–450)
RBC: 3.74 x10E6/uL — ABNORMAL LOW (ref 3.77–5.28)
RDW: 13 % (ref 11.7–15.4)
WBC: 8.7 10*3/uL (ref 3.4–10.8)

## 2023-11-09 LAB — HEMOGLOBIN A1C
Est. average glucose Bld gHb Est-mCnc: 258 mg/dL
Hgb A1c MFr Bld: 10.6 % — ABNORMAL HIGH (ref 4.8–5.6)

## 2023-11-16 ENCOUNTER — Encounter: Payer: Self-pay | Admitting: Nurse Practitioner

## 2023-11-17 ENCOUNTER — Ambulatory Visit: Payer: PPO | Admitting: Endocrinology

## 2023-11-26 ENCOUNTER — Telehealth: Payer: Self-pay | Admitting: Nurse Practitioner

## 2023-11-26 NOTE — Telephone Encounter (Signed)
Daughter states they are still paying out of pocket for pt's freestyle libre, can you check on a PA for this Thanks

## 2023-11-29 ENCOUNTER — Other Ambulatory Visit (HOSPITAL_COMMUNITY): Payer: Self-pay

## 2023-11-29 ENCOUNTER — Telehealth: Payer: Self-pay

## 2023-11-29 DIAGNOSIS — Z794 Long term (current) use of insulin: Secondary | ICD-10-CM

## 2023-11-29 NOTE — Telephone Encounter (Signed)
 Pharmacy Patient Advocate Encounter   Received notification from Physician's Office that prior authorization for FreeStyle Libre 2 Sensor is required/requested.   Insurance verification completed.   The patient is insured through [employer] .   Per test claim: PA required; PA submitted to above mentioned insurance via CoverMyMeds Key/confirmation #/EOC (Key: [facility])    Status is pending

## 2023-11-30 ENCOUNTER — Other Ambulatory Visit (HOSPITAL_COMMUNITY): Payer: Self-pay

## 2023-11-30 NOTE — Telephone Encounter (Signed)
 Pharmacy Patient Advocate Encounter  Received notification from [facility] that Prior Authorization for [facility] 2 Sensor has been APPROVED from 1.1.25 to 12.31.25. Ran test claim, Copay is $0.00. This test claim was processed through [facility]- copay amounts may vary at other pharmacies due to pharmacy/plan contracts, or as the patient moves through the different stages of their insurance plan.   PA #/Case ID/Reference #: (Key: [facility])

## 2023-11-30 NOTE — Telephone Encounter (Signed)
 Called pt left message,  called Daughter [name] & informed of Free Style Josephine Igo approval, she wanted me to let Les Pou know that the Thera Flake is working really well, sugar doing much better,  said thus far she is having to use less insulin & doing well.

## 2023-11-30 NOTE — Telephone Encounter (Signed)
 Wonderful news!!! We will plan to continue on Janumet 50-500mg  and can increase, if needed to reduce insulin dependence. Next appt 02/07/2024 we can discuss this.

## 2023-12-03 ENCOUNTER — Telehealth: Payer: Self-pay | Admitting: Nurse Practitioner

## 2023-12-03 ENCOUNTER — Other Ambulatory Visit: Payer: Self-pay

## 2023-12-03 MED ORDER — LOSARTAN POTASSIUM 50 MG PO TABS: 50.0000 mg | ORAL_TABLET | Freq: Every day | ORAL | 1 refills | Status: DC

## 2023-12-03 NOTE — Telephone Encounter (Signed)
 Pt is needing a refill on losartan sent to [facility] - [place], [place] - [address]

## 2023-12-15 ENCOUNTER — Telehealth: Payer: Self-pay | Admitting: Nurse Practitioner

## 2023-12-15 NOTE — Telephone Encounter (Signed)
 Pt daughter called and states that she was informed to call and speak to you  [phone]

## 2023-12-16 NOTE — Telephone Encounter (Signed)
 [name] states the Thera Flake has been working really well, they would like a Rx sent to [facility] states it will require a P.A. also

## 2023-12-17 ENCOUNTER — Other Ambulatory Visit: Payer: Self-pay

## 2023-12-17 ENCOUNTER — Telehealth: Payer: Self-pay

## 2023-12-17 ENCOUNTER — Other Ambulatory Visit (HOSPITAL_COMMUNITY): Payer: Self-pay

## 2023-12-17 DIAGNOSIS — E11649 Type 2 diabetes mellitus with hypoglycemia without coma: Secondary | ICD-10-CM

## 2023-12-17 MED ORDER — JANUMET 50-500 MG PO TABS: 1.0000 | ORAL_TABLET | Freq: Two times a day (BID) | ORAL | 3 refills | Status: DC

## 2023-12-17 NOTE — Telephone Encounter (Signed)
     No p/a is required. I have ran a test claim to confirm as well as spoke with the pts pref'd pharmacy and the cost is 40.00. However the pharmacy wont have the medication in stock untl Monday after 2pm.

## 2024-01-05 ENCOUNTER — Ambulatory Visit (INDEPENDENT_AMBULATORY_CARE_PROVIDER_SITE_OTHER): Admitting: Podiatry

## 2024-01-05 DIAGNOSIS — E11649 Type 2 diabetes mellitus with hypoglycemia without coma: Secondary | ICD-10-CM

## 2024-01-05 DIAGNOSIS — Z794 Long term (current) use of insulin: Secondary | ICD-10-CM

## 2024-01-05 DIAGNOSIS — M79674 Pain in right toe(s): Secondary | ICD-10-CM | POA: Diagnosis not present

## 2024-01-05 DIAGNOSIS — B351 Tinea unguium: Secondary | ICD-10-CM

## 2024-01-05 DIAGNOSIS — H43812 Vitreous degeneration, left eye: Secondary | ICD-10-CM | POA: Diagnosis not present

## 2024-01-05 DIAGNOSIS — H401221 Low-tension glaucoma, left eye, mild stage: Secondary | ICD-10-CM | POA: Diagnosis not present

## 2024-01-05 DIAGNOSIS — M79675 Pain in left toe(s): Secondary | ICD-10-CM | POA: Diagnosis not present

## 2024-01-05 DIAGNOSIS — H04123 Dry eye syndrome of bilateral lacrimal glands: Secondary | ICD-10-CM | POA: Diagnosis not present

## 2024-01-05 DIAGNOSIS — L03032 Cellulitis of left toe: Secondary | ICD-10-CM | POA: Diagnosis not present

## 2024-01-05 DIAGNOSIS — H40011 Open angle with borderline findings, low risk, right eye: Secondary | ICD-10-CM | POA: Diagnosis not present

## 2024-01-05 NOTE — Progress Notes (Signed)
 This patient returns to my office for at risk foot care.  This patient requires this care by a professional since this patient will be at risk due to having diabetic neuropathy. This patient is unable to cut nails herself since the patient cannot reach her nails.These nails are painful walking and wearing shoes.  This patient presents for at risk foot care today.  General Appearance  Alert, conversant and in no acute stress.  Vascular  Dorsalis pedis and posterior tibial  pulses are  weakly palpable  bilaterally.  Capillary return is within normal limits  bilaterally. Temperature is within normal limits  bilaterally.  Neurologic  Senn-Weinstein monofilament wire test within normal limits  bilaterally. Muscle power within normal limits bilaterally. Dried pus noted subungually left hallux.  Nails Thick disfigured discolored nails with subungual debris  from hallux to fifth toes bilaterally. No evidence of bacterial infection or drainage bilaterally.  Orthopedic  No limitations of motion  feet .  No crepitus or effusions noted.  No bony pathology or digital deformities noted.  Skin  normotropic skin with no porokeratosis noted bilaterally.  No signs of infections or ulcers noted.     Onychomycosis  Pain in right toes  Pain in left toes  Paronychia Left hallux.  Consent was obtained for treatment procedures.   Mechanical debridement of nails 1-5  bilaterally performed with a nail nipper.  Filed with dremel without incident.  Upon treatment she was found to have dried pus noted under left hallux.  Nail was debrided.  Neosporin/DSD  Peroxide at home.  Return to office sooner than 7 months.   Return office visit      3-4 months                Told patient to return for periodic foot care and evaluation due to potential at risk complications.   Helane Gunther DPM

## 2024-01-10 ENCOUNTER — Other Ambulatory Visit: Payer: Self-pay | Admitting: Nurse Practitioner

## 2024-01-12 ENCOUNTER — Other Ambulatory Visit: Payer: Self-pay | Admitting: Nurse Practitioner

## 2024-01-12 DIAGNOSIS — Z1231 Encounter for screening mammogram for malignant neoplasm of breast: Secondary | ICD-10-CM

## 2024-01-13 ENCOUNTER — Other Ambulatory Visit: Payer: Self-pay | Admitting: Nurse Practitioner

## 2024-01-13 NOTE — Telephone Encounter (Signed)
 Copied from CRM [id number]. Topic: Clinical - Medication Refill >> Jan 13, 2024  4:28 PM Alessandra Bevels wrote: Most Recent Primary Care Visit:  Provider: EARLY, SARA E  Department: [facility]  Visit Type: MED MGMT 30  Date: 11/08/2023  Medication: lovastatin (MEVACOR) 40 MG tablet [[id number]]  Has the patient contacted their pharmacy? Yes (Agent: If no, request that the patient contact the pharmacy for the refill. If patient does not wish to contact the pharmacy document the reason why and proceed with request.) (Agent: If yes, when and what did the pharmacy advise?)  Is this the correct pharmacy for this prescription? Yes If no, delete pharmacy and type the correct one.  This is the patient's preferred pharmacy:  [facility] - [place], [place] - [address] [place] [address] Phone: [phone] Fax: [phone]  [email or web address] [place], [place] - [address] [place] [address] Phone: [phone] Fax: [phone]  [facility] [address] [place]), [place] - [address] [place] [address] Phone: [phone] Fax: [phone]   Has the prescription been filled recently? Yes  Is the patient out of the medication? Yes  Has the patient been seen for an appointment in the last year OR does the patient have an upcoming appointment? Yes  Can we respond through MyChart? Yes  Agent: Please be advised that Rx refills may take up to 3 business days. We ask that you follow-up with your pharmacy.

## 2024-01-14 ENCOUNTER — Other Ambulatory Visit: Payer: Self-pay | Admitting: Endocrinology

## 2024-01-14 DIAGNOSIS — E1165 Type 2 diabetes mellitus with hyperglycemia: Secondary | ICD-10-CM

## 2024-01-14 DIAGNOSIS — M85859 Other specified disorders of bone density and structure, unspecified thigh: Secondary | ICD-10-CM

## 2024-01-14 MED ORDER — LOVASTATIN 40 MG PO TABS: ORAL_TABLET | ORAL | 1 refills | Status: DC

## 2024-01-19 ENCOUNTER — Telehealth: Payer: Self-pay

## 2024-01-19 NOTE — Telephone Encounter (Signed)
 Patient called and Mychart message sent to patient with instructions: Per MD no action needed

## 2024-02-07 ENCOUNTER — Ambulatory Visit: Payer: Medicare PPO | Admitting: Nurse Practitioner

## 2024-02-07 ENCOUNTER — Encounter: Payer: Self-pay | Admitting: Nurse Practitioner

## 2024-02-07 ENCOUNTER — Ambulatory Visit
Admission: RE | Admit: 2024-02-07 | Discharge: 2024-02-07 | Disposition: A | Discharge status: Home / Self Care | Source: Ambulatory Visit | Attending: Nurse Practitioner

## 2024-02-07 VITALS — BP 128/72 | HR 72 | Wt 129.2 lb

## 2024-02-07 DIAGNOSIS — E1159 Type 2 diabetes mellitus with other circulatory complications: Secondary | ICD-10-CM

## 2024-02-07 DIAGNOSIS — Z794 Long term (current) use of insulin: Secondary | ICD-10-CM | POA: Diagnosis not present

## 2024-02-07 DIAGNOSIS — Z1231 Encounter for screening mammogram for malignant neoplasm of breast: Secondary | ICD-10-CM

## 2024-02-07 DIAGNOSIS — E1142 Type 2 diabetes mellitus with diabetic polyneuropathy: Secondary | ICD-10-CM

## 2024-02-07 DIAGNOSIS — E1169 Type 2 diabetes mellitus with other specified complication: Secondary | ICD-10-CM

## 2024-02-07 DIAGNOSIS — E063 Autoimmune thyroiditis: Secondary | ICD-10-CM | POA: Diagnosis not present

## 2024-02-07 DIAGNOSIS — I152 Hypertension secondary to endocrine disorders: Secondary | ICD-10-CM

## 2024-02-07 DIAGNOSIS — E1165 Type 2 diabetes mellitus with hyperglycemia: Secondary | ICD-10-CM | POA: Diagnosis not present

## 2024-02-07 DIAGNOSIS — E11649 Type 2 diabetes mellitus with hypoglycemia without coma: Secondary | ICD-10-CM | POA: Diagnosis not present

## 2024-02-07 DIAGNOSIS — E785 Hyperlipidemia, unspecified: Secondary | ICD-10-CM

## 2024-02-07 DIAGNOSIS — F5101 Primary insomnia: Secondary | ICD-10-CM

## 2024-02-07 DIAGNOSIS — F03A0 Unspecified dementia, mild, without behavioral disturbance, psychotic disturbance, mood disturbance, and anxiety: Secondary | ICD-10-CM | POA: Diagnosis not present

## 2024-02-07 MED ORDER — TRAZODONE HCL 50 MG PO TABS: 25.0000 mg | ORAL_TABLET | Freq: Every evening | ORAL | 3 refills | Status: DC | PRN

## 2024-02-07 MED ORDER — INSULIN LISPRO (1 UNIT DIAL) 100 UNIT/ML (KWIKPEN): PEN_INJECTOR | SUBCUTANEOUS | 11 refills | Status: DC

## 2024-02-07 NOTE — Progress Notes (Unsigned)
 Dell Fennel, DNP, AGNP-c [facility]  [address] [place], [place] [address] [phone]  ESTABLISHED PATIENT- Chronic Health and/or Follow-Up Visit  Blood pressure 128/72, pulse 72, weight 129 lb 3.2 oz (58.6 kg).    Jennifer Bailey is a 89 y.o. year old female presenting today for evaluation and management of chronic conditions.   History of Present Illness Jennifer Bailey is an 89 year old female with diabetes who presents with uncontrolled blood sugar levels. She is here with her daughter.   She has been experiencing fluctuating blood sugar levels, which were previously well-controlled but have recently started to rise again. This morning, she did not take insulin  with her breakfast, resulting in her blood sugar reaching over 350, possibly over 400. She took three units of Humalog  insulin  about an hour and a half ago, but it has not significantly reduced her levels.  Her current regimen includes 11 units of Tresiba  in the morning and Humalog  as needed. Her blood sugar levels have been erratic, with episodes of hypoglycemia, such as a drop to 65, followed by rapid increases to over 400. This pattern has been observed multiple times, including a notable instance on April 23rd when her blood sugar dropped to 65 and then rose above 400 within an hour.  Her diet includes a variety of foods, often high in carbohydrates, such as cereal bars, fruit, and occasionally waffles with sugar-free syrup. She typically eats breakfast after taking her long-acting insulin  shot, which her daughter helps her administer over the phone. She often eats out with friends and brings home leftovers, which she consumes later.  She has a history of difficulty sleeping, which she attributes partly to emotional distress following the loss of her daughter, who was also her primary caregiver. She takes gabapentin  at night, which makes her sleepy, but she still experiences nights where she walks  the floor due to insomnia.  No chest pain, shortness of breath, or dizziness. She does not frequently take Lasix  due to its diuretic effects, which she finds inconvenient.  All ROS negative with exception of what is listed above.   PHYSICAL EXAM Physical Exam Vitals and nursing note reviewed.  Constitutional:      General: She is not in acute distress.    Appearance: Normal appearance.  HENT:     Head: Normocephalic.  Eyes:     Conjunctiva/sclera: Conjunctivae normal.  Cardiovascular:     Rate and Rhythm: Normal rate.     Pulses: Normal pulses.     Heart sounds: Normal heart sounds.  Pulmonary:     Effort: Pulmonary effort is normal.     Breath sounds: Normal breath sounds.  Abdominal:     General: Bowel sounds are normal.  Musculoskeletal:     Cervical back: No tenderness.     Right lower leg: Edema present.     Left lower leg: Edema present.  Lymphadenopathy:     Cervical: No cervical adenopathy.  Skin:    General: Skin is warm and dry.     Capillary Refill: Capillary refill takes less than 2 seconds.  Neurological:     Mental Status: She is alert and oriented to person, place, and time.     Sensory: No sensory deficit.     Motor: Weakness present.     Gait: Gait abnormal.  Psychiatric:        Mood and Affect: Mood normal.        Behavior: Behavior normal.      PLAN  Problem List Items Addressed This Visit     Hyperlipidemia associated with type 2 diabetes mellitus (HCC) (Chronic)   Currently managed with lovastatin  with no alarm symptoms. Discussed that fasting labs are not as critical as ensuring she receives her medications and eats appropriately. Will monitor.       Relevant Medications   insulin  lispro (HUMALOG ) 100 UNIT/ML KwikPen   Type 2 diabetes mellitus with hypoglycemia without coma, with long-term current use of insulin  (HCC)   Blood glucose levels fluctuate significantly, with morning readings often exceeding 350 mg/dL ad daytime readings in the  400's on regular occassions. She has no symptoms. Review of FreeStyle libre printout with patient and daughter today. . Recent changes in insulin  regimen and dietary habits may contribute to these fluctuations. Legitimate concerns about hypoglycemia have led to conservative insulin  dosing, particularly in the morning. Current regimen includes Tresiba  11 units in the morning and Humalog  as needed. Significant postprandial spikes occur after breakfast on a very consistent based, likely due to high carbohydrate intake and insufficient insulin  coverage. Insulin  dosing needs adjustment to better manage postprandial hyperglycemia, especially after breakfast. We discussed the importance of covering meals, particularly Jennifer Bailey in the day to reduce the risk of high levels all day. We also discussed that evening meal coverage can be less if her numbers are not significantly elevated.  - Increase insulin  dosing at breakfast to manage postprandial hyperglycemia. - Administer insulin  after breakfast based on blood glucose levels to avoid hypoglycemia in the event she is not eating enough. - Follow sliding scale for insulin  dosing at lunch and dinner with more conservative dosing at dinner to reduce overnight lows. . - Monitor blood glucose levels closely, especially postprandially. - Refill Humalog  and Tresiba  prescriptions. - Avoid fasting for future lab tests.      Relevant Medications   insulin  lispro (HUMALOG ) 100 UNIT/ML KwikPen   Hypertension associated with diabetes (HCC)   BP stable today. Managed well with losartan . Continue to monitor.       Relevant Medications   insulin  lispro (HUMALOG ) 100 UNIT/ML KwikPen   Diabetic polyneuropathy associated with type 2 diabetes mellitus (HCC)   Chronic. Managed with gabapentin . No new concerns.       Relevant Medications   traZODone (DESYREL) 50 MG tablet   insulin  lispro (HUMALOG ) 100 UNIT/ML KwikPen   Acquired autoimmune hypothyroidism   Managed with  levothyroxine . No acute or new symptoms. Monitor today.       Dementia without behavioral disturbance, psychotic disturbance, mood disturbance, or anxiety (HCC)   Currently able to live at home with assistance from daughters who live close by. No concerns at this time. Will continue to monitor blood sugar trends. If these continue to remain elevated, changes may be needed to ensure that she is safely monitoring her blood sugars and eating appropriately to reduce the risk of hypoglycemia or DKA due to severe hyperglycemia.       Relevant Medications   traZODone (DESYREL) 50 MG tablet   Primary insomnia - Primary   She experiences difficulty falling and staying asleep, potentially exacerbated by emotional distress and recent bereavement. Current medication includes gabapentin , which may not fully address sleep issues. She reports walking the floor at night due to inability to sleep, impacting overall health and blood glucose control. - Prescribe trazodone for insomnia, starting with half a tablet and increasing to a full tablet if needed. Use as needed for sleep.      Relevant Medications   traZODone (DESYREL) 50  MG tablet   Other Visit Diagnoses       Uncontrolled type 2 diabetes mellitus with hyperglycemia, with long-term current use of insulin  (HCC)       Relevant Medications   insulin  lispro (HUMALOG ) 100 UNIT/ML KwikPen       Return in about 3 months (around 05/09/2024) for Med Management 45.  SaraBeth Damyiah Moxley, DNP, AGNP-c Time: 46 minutes, >50% spent counseling, care coordination, chart review, and documentation.

## 2024-02-09 NOTE — Assessment & Plan Note (Signed)
 She experiences difficulty falling and staying asleep, potentially exacerbated by emotional distress and recent bereavement. Current medication includes gabapentin , which may not fully address sleep issues. She reports walking the floor at night due to inability to sleep, impacting overall health and blood glucose control. - Prescribe trazodone for insomnia, starting with half a tablet and increasing to a full tablet if needed. Use as needed for sleep.

## 2024-02-09 NOTE — Assessment & Plan Note (Signed)
 Currently managed with lovastatin  with no alarm symptoms. Discussed that fasting labs are not as critical as ensuring she receives her medications and eats appropriately. Will monitor.

## 2024-02-09 NOTE — Assessment & Plan Note (Signed)
 Managed with levothyroxine . No acute or new symptoms. Monitor today.

## 2024-02-09 NOTE — Assessment & Plan Note (Signed)
 Chronic. Managed with gabapentin . No new concerns.

## 2024-02-09 NOTE — Assessment & Plan Note (Signed)
 BP stable today. Managed well with losartan . Continue to monitor.

## 2024-02-09 NOTE — Assessment & Plan Note (Signed)
 Currently able to live at home with assistance from daughters who live close by. No concerns at this time. Will continue to monitor blood sugar trends. If these continue to remain elevated, changes may be needed to ensure that she is safely monitoring her blood sugars and eating appropriately to reduce the risk of hypoglycemia or DKA due to severe hyperglycemia.

## 2024-02-09 NOTE — Assessment & Plan Note (Signed)
 Blood glucose levels fluctuate significantly, with morning readings often exceeding 350 mg/dL ad daytime readings in the 400's on regular occassions. She has no symptoms. Review of FreeStyle libre printout with patient and daughter today. . Recent changes in insulin  regimen and dietary habits may contribute to these fluctuations. Legitimate concerns about hypoglycemia have led to conservative insulin  dosing, particularly in the morning. Current regimen includes Tresiba  11 units in the morning and Humalog  as needed. Significant postprandial spikes occur after breakfast on a very consistent based, likely due to high carbohydrate intake and insufficient insulin  coverage. Insulin  dosing needs adjustment to better manage postprandial hyperglycemia, especially after breakfast. We discussed the importance of covering meals, particularly Reese Stockman in the day to reduce the risk of high levels all day. We also discussed that evening meal coverage can be less if her numbers are not significantly elevated.  - Increase insulin  dosing at breakfast to manage postprandial hyperglycemia. - Administer insulin  after breakfast based on blood glucose levels to avoid hypoglycemia in the event she is not eating enough. - Follow sliding scale for insulin  dosing at lunch and dinner with more conservative dosing at dinner to reduce overnight lows. . - Monitor blood glucose levels closely, especially postprandially. - Refill Humalog  and Tresiba  prescriptions. - Avoid fasting for future lab tests.

## 2024-02-10 ENCOUNTER — Encounter: Payer: Self-pay | Admitting: Nurse Practitioner

## 2024-03-30 ENCOUNTER — Encounter (HOSPITAL_COMMUNITY): Payer: Self-pay

## 2024-03-30 ENCOUNTER — Emergency Department (HOSPITAL_COMMUNITY)

## 2024-03-30 ENCOUNTER — Inpatient Hospital Stay (HOSPITAL_COMMUNITY)
Admission: EM | Admit: 2024-03-30 | Discharge: 2024-04-13 | DRG: 480 | Disposition: A | Discharge status: Skilled Nursing Facility | Attending: Internal Medicine | Admitting: Internal Medicine

## 2024-03-30 DIAGNOSIS — R1311 Dysphagia, oral phase: Secondary | ICD-10-CM | POA: Diagnosis not present

## 2024-03-30 DIAGNOSIS — Z4789 Encounter for other orthopedic aftercare: Secondary | ICD-10-CM | POA: Diagnosis not present

## 2024-03-30 DIAGNOSIS — I152 Hypertension secondary to endocrine disorders: Secondary | ICD-10-CM | POA: Diagnosis present

## 2024-03-30 DIAGNOSIS — E785 Hyperlipidemia, unspecified: Secondary | ICD-10-CM | POA: Diagnosis present

## 2024-03-30 DIAGNOSIS — Z800 Family history of malignant neoplasm of digestive organs: Secondary | ICD-10-CM

## 2024-03-30 DIAGNOSIS — M810 Age-related osteoporosis without current pathological fracture: Secondary | ICD-10-CM | POA: Diagnosis present

## 2024-03-30 DIAGNOSIS — E440 Moderate protein-calorie malnutrition: Secondary | ICD-10-CM | POA: Diagnosis not present

## 2024-03-30 DIAGNOSIS — S72001A Fracture of unspecified part of neck of right femur, initial encounter for closed fracture: Secondary | ICD-10-CM

## 2024-03-30 DIAGNOSIS — D62 Acute posthemorrhagic anemia: Secondary | ICD-10-CM | POA: Diagnosis not present

## 2024-03-30 DIAGNOSIS — E871 Hypo-osmolality and hyponatremia: Secondary | ICD-10-CM | POA: Diagnosis present

## 2024-03-30 DIAGNOSIS — M47812 Spondylosis without myelopathy or radiculopathy, cervical region: Secondary | ICD-10-CM | POA: Diagnosis not present

## 2024-03-30 DIAGNOSIS — E119 Type 2 diabetes mellitus without complications: Secondary | ICD-10-CM | POA: Diagnosis not present

## 2024-03-30 DIAGNOSIS — D638 Anemia in other chronic diseases classified elsewhere: Secondary | ICD-10-CM | POA: Diagnosis not present

## 2024-03-30 DIAGNOSIS — F320 Major depressive disorder, single episode, mild: Secondary | ICD-10-CM | POA: Diagnosis present

## 2024-03-30 DIAGNOSIS — M6281 Muscle weakness (generalized): Secondary | ICD-10-CM | POA: Diagnosis not present

## 2024-03-30 DIAGNOSIS — Z7983 Long term (current) use of bisphosphonates: Secondary | ICD-10-CM

## 2024-03-30 DIAGNOSIS — M5031 Other cervical disc degeneration,  high cervical region: Secondary | ICD-10-CM | POA: Diagnosis not present

## 2024-03-30 DIAGNOSIS — S72142A Displaced intertrochanteric fracture of left femur, initial encounter for closed fracture: Secondary | ICD-10-CM | POA: Diagnosis not present

## 2024-03-30 DIAGNOSIS — D72828 Other elevated white blood cell count: Secondary | ICD-10-CM | POA: Diagnosis present

## 2024-03-30 DIAGNOSIS — S72001S Fracture of unspecified part of neck of right femur, sequela: Secondary | ICD-10-CM | POA: Diagnosis not present

## 2024-03-30 DIAGNOSIS — S0990XA Unspecified injury of head, initial encounter: Secondary | ICD-10-CM | POA: Diagnosis not present

## 2024-03-30 DIAGNOSIS — M25512 Pain in left shoulder: Secondary | ICD-10-CM | POA: Diagnosis not present

## 2024-03-30 DIAGNOSIS — Z7982 Long term (current) use of aspirin: Secondary | ICD-10-CM | POA: Diagnosis not present

## 2024-03-30 DIAGNOSIS — S72141A Displaced intertrochanteric fracture of right femur, initial encounter for closed fracture: Principal | ICD-10-CM | POA: Diagnosis present

## 2024-03-30 DIAGNOSIS — Z79890 Hormone replacement therapy (postmenopausal): Secondary | ICD-10-CM

## 2024-03-30 DIAGNOSIS — E1169 Type 2 diabetes mellitus with other specified complication: Secondary | ICD-10-CM | POA: Diagnosis present

## 2024-03-30 DIAGNOSIS — Z743 Need for continuous supervision: Secondary | ICD-10-CM | POA: Diagnosis not present

## 2024-03-30 DIAGNOSIS — E063 Autoimmune thyroiditis: Secondary | ICD-10-CM | POA: Diagnosis not present

## 2024-03-30 DIAGNOSIS — S199XXA Unspecified injury of neck, initial encounter: Secondary | ICD-10-CM | POA: Diagnosis not present

## 2024-03-30 DIAGNOSIS — Y92481 Parking lot as the place of occurrence of the external cause: Secondary | ICD-10-CM

## 2024-03-30 DIAGNOSIS — W19XXXA Unspecified fall, initial encounter: Principal | ICD-10-CM

## 2024-03-30 DIAGNOSIS — E860 Dehydration: Secondary | ICD-10-CM | POA: Diagnosis present

## 2024-03-30 DIAGNOSIS — W1782XA Fall from (out of) grocery cart, initial encounter: Secondary | ICD-10-CM | POA: Diagnosis not present

## 2024-03-30 DIAGNOSIS — E1165 Type 2 diabetes mellitus with hyperglycemia: Secondary | ICD-10-CM | POA: Diagnosis present

## 2024-03-30 DIAGNOSIS — R41841 Cognitive communication deficit: Secondary | ICD-10-CM | POA: Diagnosis not present

## 2024-03-30 DIAGNOSIS — N179 Acute kidney failure, unspecified: Secondary | ICD-10-CM | POA: Diagnosis not present

## 2024-03-30 DIAGNOSIS — Z043 Encounter for examination and observation following other accident: Secondary | ICD-10-CM | POA: Diagnosis not present

## 2024-03-30 DIAGNOSIS — N1330 Unspecified hydronephrosis: Secondary | ICD-10-CM | POA: Diagnosis not present

## 2024-03-30 DIAGNOSIS — Z79899 Other long term (current) drug therapy: Secondary | ICD-10-CM | POA: Diagnosis not present

## 2024-03-30 DIAGNOSIS — E11649 Type 2 diabetes mellitus with hypoglycemia without coma: Secondary | ICD-10-CM | POA: Diagnosis not present

## 2024-03-30 DIAGNOSIS — S72009A Fracture of unspecified part of neck of unspecified femur, initial encounter for closed fracture: Principal | ICD-10-CM | POA: Diagnosis present

## 2024-03-30 DIAGNOSIS — R1310 Dysphagia, unspecified: Secondary | ICD-10-CM | POA: Diagnosis not present

## 2024-03-30 DIAGNOSIS — E875 Hyperkalemia: Secondary | ICD-10-CM | POA: Diagnosis not present

## 2024-03-30 DIAGNOSIS — Z6826 Body mass index (BMI) 26.0-26.9, adult: Secondary | ICD-10-CM

## 2024-03-30 DIAGNOSIS — T40605A Adverse effect of unspecified narcotics, initial encounter: Secondary | ICD-10-CM | POA: Diagnosis not present

## 2024-03-30 DIAGNOSIS — I959 Hypotension, unspecified: Secondary | ICD-10-CM | POA: Diagnosis not present

## 2024-03-30 DIAGNOSIS — K8689 Other specified diseases of pancreas: Secondary | ICD-10-CM | POA: Diagnosis not present

## 2024-03-30 DIAGNOSIS — Z833 Family history of diabetes mellitus: Secondary | ICD-10-CM

## 2024-03-30 DIAGNOSIS — N3289 Other specified disorders of bladder: Secondary | ICD-10-CM | POA: Diagnosis not present

## 2024-03-30 DIAGNOSIS — R2689 Other abnormalities of gait and mobility: Secondary | ICD-10-CM | POA: Diagnosis not present

## 2024-03-30 DIAGNOSIS — F0393 Unspecified dementia, unspecified severity, with mood disturbance: Secondary | ICD-10-CM | POA: Diagnosis present

## 2024-03-30 DIAGNOSIS — M4802 Spinal stenosis, cervical region: Secondary | ICD-10-CM | POA: Diagnosis not present

## 2024-03-30 DIAGNOSIS — K5730 Diverticulosis of large intestine without perforation or abscess without bleeding: Secondary | ICD-10-CM | POA: Diagnosis not present

## 2024-03-30 DIAGNOSIS — Z880 Allergy status to penicillin: Secondary | ICD-10-CM

## 2024-03-30 DIAGNOSIS — G928 Other toxic encephalopathy: Secondary | ICD-10-CM | POA: Diagnosis not present

## 2024-03-30 DIAGNOSIS — Z90710 Acquired absence of both cervix and uterus: Secondary | ICD-10-CM

## 2024-03-30 DIAGNOSIS — Z794 Long term (current) use of insulin: Secondary | ICD-10-CM

## 2024-03-30 DIAGNOSIS — E1159 Type 2 diabetes mellitus with other circulatory complications: Secondary | ICD-10-CM | POA: Diagnosis not present

## 2024-03-30 DIAGNOSIS — M6259 Muscle wasting and atrophy, not elsewhere classified, multiple sites: Secondary | ICD-10-CM | POA: Diagnosis not present

## 2024-03-30 DIAGNOSIS — D649 Anemia, unspecified: Secondary | ICD-10-CM | POA: Diagnosis not present

## 2024-03-30 DIAGNOSIS — I10 Essential (primary) hypertension: Secondary | ICD-10-CM | POA: Diagnosis not present

## 2024-03-30 DIAGNOSIS — R2681 Unsteadiness on feet: Secondary | ICD-10-CM | POA: Diagnosis not present

## 2024-03-30 DIAGNOSIS — E1142 Type 2 diabetes mellitus with diabetic polyneuropathy: Secondary | ICD-10-CM | POA: Diagnosis not present

## 2024-03-30 DIAGNOSIS — M25551 Pain in right hip: Secondary | ICD-10-CM | POA: Diagnosis not present

## 2024-03-30 DIAGNOSIS — R9431 Abnormal electrocardiogram [ECG] [EKG]: Secondary | ICD-10-CM | POA: Diagnosis not present

## 2024-03-30 DIAGNOSIS — K5900 Constipation, unspecified: Secondary | ICD-10-CM | POA: Diagnosis not present

## 2024-03-30 DIAGNOSIS — Z888 Allergy status to other drugs, medicaments and biological substances status: Secondary | ICD-10-CM

## 2024-03-30 LAB — BASIC METABOLIC PANEL WITH GFR
Anion gap: 11 (ref 5–15)
BUN: 16 mg/dL (ref 8–23)
CO2: 22 mmol/L (ref 22–32)
Calcium: 9.2 mg/dL (ref 8.9–10.3)
Chloride: 99 mmol/L (ref 98–111)
Creatinine, Ser: 1.05 mg/dL — ABNORMAL HIGH (ref 0.44–1.00)
GFR, Estimated: 52 mL/min — ABNORMAL LOW (ref >60)
Glucose, Bld: 257 mg/dL — ABNORMAL HIGH (ref 70–99)
Potassium: 3.8 mmol/L (ref 3.5–5.1)
Sodium: 132 mmol/L — ABNORMAL LOW (ref 135–145)

## 2024-03-30 LAB — CBC WITH DIFFERENTIAL/PLATELET
Abs Immature Granulocytes: 0.09 10*3/uL — ABNORMAL HIGH (ref 0.00–0.07)
Basophils Absolute: 0.1 10*3/uL (ref 0.0–0.1)
Basophils Relative: 0 %
Eosinophils Absolute: 0.2 10*3/uL (ref 0.0–0.5)
Eosinophils Relative: 2 %
HCT: 31.5 % — ABNORMAL LOW (ref 36.0–46.0)
Hemoglobin: 10.9 g/dL — ABNORMAL LOW (ref 12.0–15.0)
Immature Granulocytes: 1 %
Lymphocytes Relative: 9 %
Lymphs Abs: 1.4 10*3/uL (ref 0.7–4.0)
MCH: 30.6 pg (ref 26.0–34.0)
MCHC: 34.6 g/dL (ref 30.0–36.0)
MCV: 88.5 fL (ref 80.0–100.0)
Monocytes Absolute: 1 10*3/uL (ref 0.1–1.0)
Monocytes Relative: 7 %
Neutro Abs: 12 10*3/uL — ABNORMAL HIGH (ref 1.7–7.7)
Neutrophils Relative %: 81 %
Platelets: 266 10*3/uL (ref 150–400)
RBC: 3.56 MIL/uL — ABNORMAL LOW (ref 3.87–5.11)
RDW: 13.3 % (ref 11.5–15.5)
WBC: 14.8 10*3/uL — ABNORMAL HIGH (ref 4.0–10.5)
nRBC: 0 % (ref 0.0–0.2)

## 2024-03-30 LAB — CBG MONITORING, ED: Glucose-Capillary: 349 mg/dL — ABNORMAL HIGH (ref 70–99)

## 2024-03-30 MED ORDER — INSULIN ASPART 100 UNIT/ML IJ SOLN
0.0000 [IU] | INTRAMUSCULAR | Status: DC
  Administered 2024-03-30: 7 [IU] via SUBCUTANEOUS
  Administered 2024-03-31: 5 [IU] via SUBCUTANEOUS
  Administered 2024-03-31: 3 [IU] via SUBCUTANEOUS
  Administered 2024-03-31 – 2024-04-01 (×2): 2 [IU] via SUBCUTANEOUS
  Administered 2024-04-01: 3 [IU] via SUBCUTANEOUS
  Administered 2024-04-01: 2 [IU] via SUBCUTANEOUS
  Administered 2024-04-01 (×2): 3 [IU] via SUBCUTANEOUS
  Administered 2024-04-01: 5 [IU] via SUBCUTANEOUS
  Administered 2024-04-02 (×3): 3 [IU] via SUBCUTANEOUS
  Administered 2024-04-02: 1 [IU] via SUBCUTANEOUS
  Administered 2024-04-02 – 2024-04-03 (×2): 3 [IU] via SUBCUTANEOUS
  Administered 2024-04-03: 5 [IU] via SUBCUTANEOUS
  Administered 2024-04-03: 3 [IU] via SUBCUTANEOUS
  Administered 2024-04-03: 1 [IU] via SUBCUTANEOUS
  Administered 2024-04-03 – 2024-04-04 (×2): 5 [IU] via SUBCUTANEOUS

## 2024-03-30 MED ORDER — DORZOLAMIDE HCL-TIMOLOL MAL 2-0.5 % OP SOLN
1.0000 [drp] | Freq: Two times a day (BID) | OPHTHALMIC | Status: DC
  Filled 2024-03-30: qty 10

## 2024-03-30 MED ORDER — PRAVASTATIN SODIUM 40 MG PO TABS
40.0000 mg | ORAL_TABLET | Freq: Every day | ORAL | Status: DC
  Administered 2024-03-31 – 2024-04-13 (×14): 40 mg via ORAL
  Filled 2024-03-30 (×14): qty 1

## 2024-03-30 MED ORDER — ONDANSETRON HCL 4 MG/2ML IJ SOLN
4.0000 mg | Freq: Once | INTRAMUSCULAR | Status: AC
  Administered 2024-03-30: 4 mg via INTRAVENOUS
  Filled 2024-03-30: qty 2

## 2024-03-30 MED ORDER — TRAZODONE HCL 50 MG PO TABS
25.0000 mg | ORAL_TABLET | Freq: Every evening | ORAL | Status: DC | PRN
  Administered 2024-04-02 – 2024-04-13 (×6): 25 mg via ORAL
  Filled 2024-03-30 (×9): qty 1

## 2024-03-30 MED ORDER — DORZOLAMIDE HCL-TIMOLOL MAL 2-0.5 % OP SOLN
1.0000 [drp] | Freq: Two times a day (BID) | OPHTHALMIC | Status: DC
  Administered 2024-03-31 – 2024-04-13 (×26): 1 [drp] via OPHTHALMIC
  Filled 2024-03-30: qty 10

## 2024-03-30 MED ORDER — MORPHINE SULFATE (PF) 2 MG/ML IV SOLN
0.5000 mg | INTRAVENOUS | Status: DC | PRN
  Administered 2024-04-03 (×2): 0.5 mg via INTRAVENOUS
  Filled 2024-03-30 (×2): qty 1

## 2024-03-30 MED ORDER — DORZOLAMIDE HCL-TIMOLOL MAL PF 22.3-6.8 MG/ML OP SOLN: 1.0000 [drp] | Freq: Two times a day (BID) | OPHTHALMIC | Status: DC

## 2024-03-30 MED ORDER — BRIMONIDINE TARTRATE 0.2 % OP SOLN
1.0000 [drp] | Freq: Two times a day (BID) | OPHTHALMIC | Status: DC
  Administered 2024-03-31 – 2024-04-13 (×26): 1 [drp] via OPHTHALMIC
  Filled 2024-03-30 (×2): qty 5

## 2024-03-30 MED ORDER — HYDRALAZINE HCL 20 MG/ML IJ SOLN: 5.0000 mg | INTRAMUSCULAR | Status: DC | PRN

## 2024-03-30 MED ORDER — FENTANYL CITRATE PF 50 MCG/ML IJ SOSY
50.0000 ug | PREFILLED_SYRINGE | Freq: Once | INTRAMUSCULAR | Status: AC
  Administered 2024-03-30: 50 ug via INTRAVENOUS
  Filled 2024-03-30: qty 1

## 2024-03-30 MED ORDER — LEVOTHYROXINE SODIUM 100 MCG PO TABS
100.0000 ug | ORAL_TABLET | Freq: Every day | ORAL | Status: DC
  Administered 2024-04-01 – 2024-04-13 (×13): 100 ug via ORAL
  Filled 2024-03-30 (×13): qty 1

## 2024-03-30 MED ORDER — HYDROCODONE-ACETAMINOPHEN 5-325 MG PO TABS
1.0000 | ORAL_TABLET | ORAL | Status: DC | PRN
  Administered 2024-03-30: 2 via ORAL
  Administered 2024-03-31: 1 via ORAL
  Administered 2024-04-01 (×2): 2 via ORAL
  Administered 2024-04-01 – 2024-04-02 (×3): 1 via ORAL
  Administered 2024-04-03: 2 via ORAL
  Filled 2024-03-30 (×2): qty 2
  Filled 2024-03-30 (×2): qty 1
  Filled 2024-03-30 (×2): qty 2
  Filled 2024-03-30: qty 1
  Filled 2024-03-30: qty 2

## 2024-03-30 NOTE — ED Notes (Signed)
 Received report from [place], Charity fundraiser.  Moved to 41. Placed on the monitor. Call bell within reach.

## 2024-03-30 NOTE — ED Notes (Signed)
 Family at bedside.
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
## 2024-03-30 NOTE — ED Notes (Signed)
 Pt placed on purewick

## 2024-03-30 NOTE — ED Notes (Signed)
 Patient transported to X-ray
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
## 2024-03-30 NOTE — ED Notes (Signed)
 Contacted pharmacy regarding eye drops. To be tubed.

## 2024-03-31 ENCOUNTER — Inpatient Hospital Stay (HOSPITAL_COMMUNITY): Admitting: Anesthesiology

## 2024-03-31 ENCOUNTER — Encounter (HOSPITAL_COMMUNITY): Payer: Self-pay | Admitting: Internal Medicine

## 2024-03-31 ENCOUNTER — Other Ambulatory Visit: Payer: Self-pay

## 2024-03-31 ENCOUNTER — Inpatient Hospital Stay (HOSPITAL_COMMUNITY)

## 2024-03-31 ENCOUNTER — Encounter (HOSPITAL_COMMUNITY)
Admission: EM | Disposition: A | Discharge status: Skilled Nursing Facility | Payer: Self-pay | Source: Home / Self Care | Attending: Internal Medicine

## 2024-03-31 DIAGNOSIS — Z794 Long term (current) use of insulin: Secondary | ICD-10-CM

## 2024-03-31 DIAGNOSIS — D649 Anemia, unspecified: Secondary | ICD-10-CM

## 2024-03-31 DIAGNOSIS — I10 Essential (primary) hypertension: Secondary | ICD-10-CM | POA: Diagnosis not present

## 2024-03-31 DIAGNOSIS — E1159 Type 2 diabetes mellitus with other circulatory complications: Secondary | ICD-10-CM | POA: Diagnosis not present

## 2024-03-31 DIAGNOSIS — I152 Hypertension secondary to endocrine disorders: Secondary | ICD-10-CM

## 2024-03-31 DIAGNOSIS — N179 Acute kidney failure, unspecified: Secondary | ICD-10-CM | POA: Diagnosis not present

## 2024-03-31 DIAGNOSIS — S72141A Displaced intertrochanteric fracture of right femur, initial encounter for closed fracture: Secondary | ICD-10-CM

## 2024-03-31 DIAGNOSIS — E119 Type 2 diabetes mellitus without complications: Secondary | ICD-10-CM

## 2024-03-31 DIAGNOSIS — S72001A Fracture of unspecified part of neck of right femur, initial encounter for closed fracture: Secondary | ICD-10-CM | POA: Diagnosis not present

## 2024-03-31 HISTORY — PX: INTRAMEDULLARY (IM) NAIL INTERTROCHANTERIC: SHX5875

## 2024-03-31 LAB — BASIC METABOLIC PANEL WITH GFR
Anion gap: 12 (ref 5–15)
BUN: 16 mg/dL (ref 8–23)
CO2: 22 mmol/L (ref 22–32)
Calcium: 8.9 mg/dL (ref 8.9–10.3)
Chloride: 99 mmol/L (ref 98–111)
Creatinine, Ser: 0.98 mg/dL (ref 0.44–1.00)
GFR, Estimated: 56 mL/min — ABNORMAL LOW
Glucose, Bld: 190 mg/dL — ABNORMAL HIGH (ref 70–99)
Potassium: 3.7 mmol/L (ref 3.5–5.1)
Sodium: 133 mmol/L — ABNORMAL LOW (ref 135–145)

## 2024-03-31 LAB — SURGICAL PCR SCREEN
MRSA, PCR: NEGATIVE
Staphylococcus aureus: NEGATIVE

## 2024-03-31 LAB — CBC
HCT: 30.5 % — ABNORMAL LOW (ref 36.0–46.0)
Hemoglobin: 10.4 g/dL — ABNORMAL LOW (ref 12.0–15.0)
MCH: 31 pg (ref 26.0–34.0)
MCHC: 34.1 g/dL (ref 30.0–36.0)
MCV: 90.8 fL (ref 80.0–100.0)
Platelets: 240 10*3/uL (ref 150–400)
RBC: 3.36 MIL/uL — ABNORMAL LOW (ref 3.87–5.11)
RDW: 13.5 % (ref 11.5–15.5)
WBC: 11.3 10*3/uL — ABNORMAL HIGH (ref 4.0–10.5)
nRBC: 0 % (ref 0.0–0.2)

## 2024-03-31 LAB — HEMOGLOBIN A1C
Hgb A1c MFr Bld: 8.9 % — ABNORMAL HIGH (ref 4.8–5.6)
Mean Plasma Glucose: 208.73 mg/dL

## 2024-03-31 LAB — GLUCOSE, CAPILLARY
Glucose-Capillary: 246 mg/dL — ABNORMAL HIGH (ref 70–99)
Glucose-Capillary: 246 mg/dL — ABNORMAL HIGH (ref 70–99)
Glucose-Capillary: 262 mg/dL — ABNORMAL HIGH (ref 70–99)
Glucose-Capillary: 413 mg/dL — ABNORMAL HIGH (ref 70–99)
Glucose-Capillary: 329 mg/dL — ABNORMAL HIGH (ref 70–99)

## 2024-03-31 LAB — CBG MONITORING, ED
Glucose-Capillary: 226 mg/dL — ABNORMAL HIGH (ref 70–99)
Glucose-Capillary: 175 mg/dL — ABNORMAL HIGH (ref 70–99)
Glucose-Capillary: 159 mg/dL — ABNORMAL HIGH (ref 70–99)
Glucose-Capillary: 159 mg/dL — ABNORMAL HIGH (ref 70–99)

## 2024-03-31 SURGERY — FIXATION, FRACTURE, INTERTROCHANTERIC, WITH INTRAMEDULLARY ROD
Anesthesia: General | Laterality: Right

## 2024-03-31 MED ORDER — CEFAZOLIN SODIUM-DEXTROSE 2-4 GM/100ML-% IV SOLN
2.0000 g | Freq: Three times a day (TID) | INTRAVENOUS | Status: AC
  Administered 2024-03-31 – 2024-04-01 (×3): 2 g via INTRAVENOUS
  Filled 2024-03-31 (×3): qty 100

## 2024-03-31 MED ORDER — FENTANYL CITRATE (PF) 250 MCG/5ML IJ SOLN
INTRAMUSCULAR | Status: DC | PRN
  Administered 2024-03-31: 50 ug via INTRAVENOUS

## 2024-03-31 MED ORDER — ONDANSETRON HCL 4 MG PO TABS
4.0000 mg | ORAL_TABLET | Freq: Three times a day (TID) | ORAL | Status: DC | PRN
  Administered 2024-03-31 (×2): 4 mg via ORAL
  Filled 2024-03-31 (×2): qty 1

## 2024-03-31 MED ORDER — INSULIN ASPART 100 UNIT/ML IJ SOLN
10.0000 [IU] | Freq: Once | INTRAMUSCULAR | Status: AC
  Administered 2024-03-31: 10 [IU] via SUBCUTANEOUS

## 2024-03-31 MED ORDER — ROCURONIUM BROMIDE 10 MG/ML (PF) SYRINGE
PREFILLED_SYRINGE | INTRAVENOUS | Status: DC | PRN
  Administered 2024-03-31: 30 mg via INTRAVENOUS
  Administered 2024-03-31: 10 mg via INTRAVENOUS

## 2024-03-31 MED ORDER — AMISULPRIDE (ANTIEMETIC) 5 MG/2ML IV SOLN
10.0000 mg | Freq: Once | INTRAVENOUS | Status: AC | PRN
Start: 2024-03-31 — End: 2024-03-31
  Administered 2024-03-31: 10 mg via INTRAVENOUS

## 2024-03-31 MED ORDER — ORAL CARE MOUTH RINSE: 15.0000 mL | Freq: Once | OROMUCOSAL | Status: AC

## 2024-03-31 MED ORDER — CEFAZOLIN SODIUM-DEXTROSE 2-4 GM/100ML-% IV SOLN
INTRAVENOUS | Status: AC
  Filled 2024-03-31: qty 100

## 2024-03-31 MED ORDER — ONDANSETRON HCL 4 MG/2ML IJ SOLN
INTRAMUSCULAR | Status: DC | PRN
  Administered 2024-03-31: 4 mg via INTRAVENOUS

## 2024-03-31 MED ORDER — LACTATED RINGERS IV SOLN: INTRAVENOUS | Status: DC

## 2024-03-31 MED ORDER — FENTANYL CITRATE (PF) 250 MCG/5ML IJ SOLN
INTRAMUSCULAR | Status: AC
  Filled 2024-03-31: qty 5

## 2024-03-31 MED ORDER — PHENYLEPHRINE 80 MCG/ML (10ML) SYRINGE FOR IV PUSH (FOR BLOOD PRESSURE SUPPORT)
PREFILLED_SYRINGE | INTRAVENOUS | Status: AC
Start: 2024-03-31 — End: 2024-03-31
  Filled 2024-03-31: qty 10

## 2024-03-31 MED ORDER — SUGAMMADEX SODIUM 200 MG/2ML IV SOLN
INTRAVENOUS | Status: DC | PRN
  Administered 2024-03-31: 100 mg via INTRAVENOUS

## 2024-03-31 MED ORDER — FENTANYL CITRATE (PF) 100 MCG/2ML IJ SOLN
25.0000 ug | INTRAMUSCULAR | Status: DC | PRN
  Administered 2024-03-31: 25 ug via INTRAVENOUS

## 2024-03-31 MED ORDER — PHENYLEPHRINE 80 MCG/ML (10ML) SYRINGE FOR IV PUSH (FOR BLOOD PRESSURE SUPPORT)
PREFILLED_SYRINGE | INTRAVENOUS | Status: DC | PRN
  Administered 2024-03-31: 80 ug via INTRAVENOUS
  Administered 2024-03-31: 160 ug via INTRAVENOUS

## 2024-03-31 MED ORDER — ACETAMINOPHEN 10 MG/ML IV SOLN
INTRAVENOUS | Status: DC | PRN
  Administered 2024-03-31: 870 mg via INTRAVENOUS

## 2024-03-31 MED ORDER — CHLORHEXIDINE GLUCONATE 0.12 % MT SOLN
OROMUCOSAL | Status: AC
  Filled 2024-03-31: qty 15

## 2024-03-31 MED ORDER — DEXAMETHASONE SODIUM PHOSPHATE 10 MG/ML IJ SOLN
INTRAMUSCULAR | Status: DC | PRN
  Administered 2024-03-31: 10 mg via INTRAVENOUS

## 2024-03-31 MED ORDER — ACETAMINOPHEN 10 MG/ML IV SOLN
INTRAVENOUS | Status: AC
  Filled 2024-03-31: qty 100

## 2024-03-31 MED ORDER — LACTATED RINGERS IV BOLUS: 500.0000 mL | Freq: Once | INTRAVENOUS | Status: DC

## 2024-03-31 MED ORDER — INSULIN ASPART 100 UNIT/ML IJ SOLN
2.0000 [IU] | Freq: Once | INTRAMUSCULAR | Status: AC
  Administered 2024-03-31: 2 [IU] via SUBCUTANEOUS

## 2024-03-31 MED ORDER — ROCURONIUM BROMIDE 10 MG/ML (PF) SYRINGE
PREFILLED_SYRINGE | INTRAVENOUS | Status: AC
  Filled 2024-03-31: qty 10

## 2024-03-31 MED ORDER — ALBUMIN HUMAN 5 % IV SOLN: INTRAVENOUS | Status: DC | PRN

## 2024-03-31 MED ORDER — ONDANSETRON HCL 4 MG/2ML IJ SOLN
INTRAMUSCULAR | Status: AC
  Filled 2024-03-31: qty 2

## 2024-03-31 MED ORDER — PROPOFOL 10 MG/ML IV BOLUS
INTRAVENOUS | Status: DC | PRN
  Administered 2024-03-31: 80 mg via INTRAVENOUS

## 2024-03-31 MED ORDER — ENOXAPARIN SODIUM 40 MG/0.4ML IJ SOSY
40.0000 mg | PREFILLED_SYRINGE | INTRAMUSCULAR | Status: DC
  Administered 2024-04-01: 40 mg via SUBCUTANEOUS
  Filled 2024-03-31: qty 0.4

## 2024-03-31 MED ORDER — POVIDONE-IODINE 10 % EX SWAB
2.0000 | Freq: Once | CUTANEOUS | Status: AC
  Administered 2024-03-31: 2 via TOPICAL

## 2024-03-31 MED ORDER — CEFAZOLIN SODIUM-DEXTROSE 2-4 GM/100ML-% IV SOLN
2.0000 g | INTRAVENOUS | Status: AC
  Administered 2024-03-31: 2 g via INTRAVENOUS

## 2024-03-31 MED ORDER — TRANEXAMIC ACID-NACL 1000-0.7 MG/100ML-% IV SOLN
INTRAVENOUS | Status: AC
  Filled 2024-03-31: qty 100

## 2024-03-31 MED ORDER — CHLORHEXIDINE GLUCONATE 4 % EX SOLN: 60.0000 mL | Freq: Once | CUTANEOUS | Status: DC

## 2024-03-31 MED ORDER — INSULIN GLARGINE-YFGN 100 UNIT/ML ~~LOC~~ SOLN
10.0000 [IU] | Freq: Every day | SUBCUTANEOUS | Status: DC
  Administered 2024-03-31 – 2024-04-03 (×4): 10 [IU] via SUBCUTANEOUS
  Filled 2024-03-31 (×5): qty 0.1

## 2024-03-31 MED ORDER — AMISULPRIDE (ANTIEMETIC) 5 MG/2ML IV SOLN
INTRAVENOUS | Status: AC
Start: 2024-03-31 — End: 2024-03-31
  Filled 2024-03-31: qty 4

## 2024-03-31 MED ORDER — 0.9 % SODIUM CHLORIDE (POUR BTL) OPTIME
TOPICAL | Status: DC | PRN
  Administered 2024-03-31: 1000 mL

## 2024-03-31 MED ORDER — DEXAMETHASONE SODIUM PHOSPHATE 10 MG/ML IJ SOLN
INTRAMUSCULAR | Status: AC
  Filled 2024-03-31: qty 1

## 2024-03-31 MED ORDER — LIDOCAINE 2% (20 MG/ML) 5 ML SYRINGE
INTRAMUSCULAR | Status: AC
  Filled 2024-03-31: qty 5

## 2024-03-31 MED ORDER — FENTANYL CITRATE (PF) 100 MCG/2ML IJ SOLN
INTRAMUSCULAR | Status: AC
  Filled 2024-03-31: qty 2

## 2024-03-31 MED ORDER — CHLORHEXIDINE GLUCONATE 0.12 % MT SOLN
15.0000 mL | Freq: Once | OROMUCOSAL | Status: AC
  Administered 2024-03-31: 15 mL via OROMUCOSAL

## 2024-03-31 MED ORDER — TRANEXAMIC ACID-NACL 1000-0.7 MG/100ML-% IV SOLN
1000.0000 mg | INTRAVENOUS | Status: AC
  Administered 2024-03-31: 1000 mg via INTRAVENOUS

## 2024-03-31 MED ORDER — PHENYLEPHRINE HCL-NACL 20-0.9 MG/250ML-% IV SOLN
INTRAVENOUS | Status: DC | PRN
  Administered 2024-03-31: 40 ug/min via INTRAVENOUS

## 2024-03-31 MED ORDER — PROPOFOL 10 MG/ML IV BOLUS
INTRAVENOUS | Status: AC
  Filled 2024-03-31: qty 20

## 2024-03-31 MED ORDER — LIDOCAINE 2% (20 MG/ML) 5 ML SYRINGE
INTRAMUSCULAR | Status: DC | PRN
  Administered 2024-03-31: 40 mg via INTRAVENOUS

## 2024-03-31 SURGICAL SUPPLY — 38 items
BAG COUNTER SPONGE SURGICOUNT (BAG) IMPLANT
BIT DRILL INTERTAN LAG SCREW (BIT) IMPLANT
BIT DRILL LONG 4.0 (BIT) IMPLANT
BRUSH SCRUB EZ PLAIN DRY (MISCELLANEOUS) ×2 IMPLANT
CHLORAPREP W/TINT 26 (MISCELLANEOUS) ×1 IMPLANT
COVER PERINEAL POST (MISCELLANEOUS) ×1 IMPLANT
COVER SURGICAL LIGHT HANDLE (MISCELLANEOUS) ×1 IMPLANT
DERMABOND ADVANCED .7 DNX12 (GAUZE/BANDAGES/DRESSINGS) ×1 IMPLANT
DRAPE C-ARM 35X43 STRL (DRAPES) ×1 IMPLANT
DRAPE IMP U-DRAPE 54X76 (DRAPES) ×2 IMPLANT
DRAPE INCISE IOBAN 66X45 STRL (DRAPES) ×1 IMPLANT
DRAPE STERI IOBAN 125X83 (DRAPES) ×1 IMPLANT
DRAPE SURG 17X23 STRL (DRAPES) ×2 IMPLANT
DRAPE U-SHAPE 47X51 STRL (DRAPES) ×1 IMPLANT
DRESSING MEPILEX FLEX 4X4 (GAUZE/BANDAGES/DRESSINGS) ×1 IMPLANT
DRSG MEPILEX POST OP 4X8 (GAUZE/BANDAGES/DRESSINGS) ×1 IMPLANT
ELECTRODE REM PT RTRN 9FT ADLT (ELECTROSURGICAL) ×1 IMPLANT
GLOVE BIO SURGEON STRL SZ 6.5 (GLOVE) ×3 IMPLANT
GLOVE BIO SURGEON STRL SZ7.5 (GLOVE) ×4 IMPLANT
GLOVE BIOGEL PI IND STRL 6.5 (GLOVE) ×1 IMPLANT
GLOVE BIOGEL PI IND STRL 7.5 (GLOVE) ×1 IMPLANT
GOWN STRL REUS W/ TWL LRG LVL3 (GOWN DISPOSABLE) ×1 IMPLANT
GUIDEROD BALL TIP 3.0X800 (ORTHOPEDIC DISPOSABLE SUPPLIES) IMPLANT
KIT BASIN OR (CUSTOM PROCEDURE TRAY) ×1 IMPLANT
KIT TURNOVER KIT B (KITS) ×1 IMPLANT
MANIFOLD NEPTUNE II (INSTRUMENTS) ×1 IMPLANT
NAIL INTERTAN 10X18 130D 10S (Nail) IMPLANT
NS IRRIG 1000ML POUR BTL (IV SOLUTION) ×1 IMPLANT
PACK GENERAL/GYN (CUSTOM PROCEDURE TRAY) ×1 IMPLANT
PAD ARMBOARD POSITIONER FOAM (MISCELLANEOUS) ×2 IMPLANT
PIN GUIDE 3.2X343MM (PIN) IMPLANT
SCREW LAG COMPR KIT 95/90 (Screw) IMPLANT
SCREW TRIGEN LOW PROF 5.0X35 (Screw) IMPLANT
SUT MNCRL AB 3-0 PS2 18 (SUTURE) ×1 IMPLANT
SUT VIC AB 0 CT1 27XBRD ANBCTR (SUTURE) IMPLANT
SUT VIC AB 2-0 CT1 TAPERPNT 27 (SUTURE) ×2 IMPLANT
TOWEL GREEN STERILE (TOWEL DISPOSABLE) ×2 IMPLANT
WATER STERILE IRR 1000ML POUR (IV SOLUTION) ×1 IMPLANT

## 2024-03-31 NOTE — H&P (View-Only) (Signed)
 Orthopaedic Trauma Service (OTS) Consult   Patient ID: Jennifer Bailey MRN: [id number] DOB/AGE: May 25, 1935 88 y.o.  Reason for Consult:Right intertrochanteric femur fracture Referring Physician: Dr. Josefa Herring, MD Jennifer Bailey  HPI: Jennifer Bailey is an 89 y.o. female who is being seen in consultation at the request of Dr. Herring for evaluation of right intertrochanteric femur fracture.  Patient sustained a ground-level fall.  She was brought to the emergency room where x-rays showed a right intertrochanteric femur fracture.  Due to the OR availability I was asked to manage her primarily.  She is known to me from a previous ORIF of her tibial plateau fracture in 2022.  Patient currently comfortable.  No family at bedside.  She was seen in the preoperative holding area.  She is ambulatory with assist device.  Past Medical History:  Diagnosis Date   Counseling regarding goals of care 12/09/2020   Depression    Diabetes mellitus without complication (HCC)    Fall at home, initial encounter 12/02/2020   Family history of colon cancer- in her brother 07/09/2019   Family history of diabetes mellitus in brother 07/09/2019   Grief 12/24/2022   Hyperlipidemia associated with type 2 diabetes mellitus (HCC)    Hypertension associated with type 2 diabetes mellitus (HCC)    Memory changes    Osteoporosis    Polyneuropathy due to type 2 diabetes mellitus (HCC)    Pressure injury of skin of heel 12/09/2020   Thyroid  disease    Tibia/fibula fracture 12/02/2020   Tremulousness of voice 07/09/2019   Unsteady gait     Past Surgical History:  Procedure Laterality Date   ABDOMINAL HYSTERECTOMY     CHOLECYSTECTOMY     COLON SURGERY     ORIF TIBIA PLATEAU Right 12/04/2020   Procedure: OPEN REDUCTION INTERNAL FIXATION (ORIF) TIBIAL PLATEAU;  Surgeon: Jennifer Franky SQUIBB, MD;  Location: [facility] OR;  Service: Orthopedics;  Laterality: Right;    Family History  Problem Relation Age of Onset   Cancer  Mother    Diabetes Brother     Social History:  reports that she has never smoked. She has never used smokeless tobacco. She reports that she does not drink alcohol  and does not use drugs.  Allergies:  Allergies  Allergen Reactions   Macrodantin [Nitrofurantoin Macrocrystal] Swelling   Prednisone     Make real shaky-like, nervious   Penicillins Rash    Medications:  No current facility-administered medications on file prior to encounter.   Current Outpatient Medications on File Prior to Encounter  Medication Sig Dispense Refill   alendronate  (FOSAMAX ) 70 MG tablet Take with a full glass of water on an empty stomach. (Patient taking differently: Take 70 mg by mouth once a week. Take with a full glass of water on an empty stomach.) 4 tablet 11   aspirin  81 MG tablet Take 1 tablet (81 mg total) by mouth daily.     brimonidine  (ALPHAGAN ) 0.2 % ophthalmic solution Place 1 drop into the left eye 2 (two) times daily.     dorzolamidel-timolol  (COSOPT ) 22.3-6.8 MG/ML SOLN ophthalmic solution Place 1 drop into the left eye 2 (two) times daily.     furosemide  (LASIX ) 20 MG tablet Take 1 tablet by mouth every other day. (Patient taking differently: Take 20 mg by mouth daily as needed for fluid.) 45 tablet 1   gabapentin  (NEURONTIN ) 100 MG capsule TAKE 4 CAPSULES (400 MG TOTAL) BY MOUTH AT BEDTIME. (Patient taking differently: Take 300 mg by mouth  at bedtime.) 120 capsule 4   Glucagon  (GVOKE HYPOPEN  1-PACK) 1 MG/0.2ML SOAJ Inject 1 mg into the skin as needed (low blood sugar with impaired consciousness). 0.4 mL 2   insulin  lispro (HUMALOG ) 100 UNIT/ML KwikPen INJECT 3 TO 5 UNITS SUBCUTANEOUSLY BEFORE MEALS AS DIRECTED. (Patient taking differently: Inject 2-3 Units into the skin 3 (three) times daily. INJECT 3 TO 5 UNITS SUBCUTANEOUSLY BEFORE MEALS AS DIRECTED.) 15 mL 11   levothyroxine  (SYNTHROID ) 100 MCG tablet TAKE 1 TABLET (100 MCG TOTAL) BY MOUTH DAILY BEFORE BREAKFAST. 30 tablet 4   losartan   (COZAAR ) 50 MG tablet Take 1 tablet (50 mg total) by mouth daily. 90 tablet 1   lovastatin  (MEVACOR ) 40 MG tablet TAKE 1 TABLET BY MOUTH ONCE DAILY WITH A MEAL. 90 tablet 1   Multiple Vitamins-Minerals (PRESERVISION AREDS 2) CAPS Take 1 capsule by mouth 2 (two) times daily.     Omega-3 1000 MG CAPS Take 2 capsules by mouth 2 (two) times daily.     sitaGLIPtin-metformin (JANUMET ) 50-500 MG tablet Take 1 tablet by mouth 2 (two) times daily with a meal. 60 tablet 3   traZODone  (DESYREL ) 50 MG tablet Take 0.5-1 tablets (25-50 mg total) by mouth at bedtime as needed for sleep. 30 tablet 3   TRESIBA  FLEXTOUCH 100 UNIT/ML FlexTouch Pen INJECT 0.11 MLS (11 UNITS TOTAL) INTO THE SKIN DAILY. 15 mL 3   Alcohol  Swabs (ALCOHOL  PADS) 70 % PADS USE 4 PER DAY 400 each 3   BD PEN NEEDLE NANO U/F 32G X 4 MM MISC USE AS DIRECTED FIVE TIMES A DAY. 200 each 5   Continuous Blood Gluc Receiver (FREESTYLE LIBRE 14 DAY READER) DEVI USE DAILY TO CHECK BLOOD SUGAR 1 each 0   Continuous Glucose Sensor (FREESTYLE LIBRE 2 SENSOR) MISC APPLY TO ARM EVERY 14 DAYS TO MONITOR BLOOD SUGAR. 6 each 3   glucose blood (ONETOUCH ULTRA) test strip Use to check blood sugar once a day if needed 50 each 2   ONETOUCH VERIO test strip USE TO TEST BLOOD SUGAR 4 TIMES DAILY 150 strip 2     ROS: Constitutional: No fever or chills Vision: No changes in vision ENT: No difficulty swallowing CV: No chest pain Pulm: No SOB or wheezing GI: No nausea or vomiting GU: No urgency or inability to hold urine Skin: No poor wound healing Neurologic: No numbness or tingling Psychiatric: No depression or anxiety Heme: No bruising Allergic: No reaction to medications or food   Exam: Blood pressure (!) 130/58, pulse 73, temperature 98 F (36.7 C), temperature source Oral, resp. rate 17, height 4' 10 (1.473 m), weight 58.5 kg, SpO2 100%. General: No acute distress Orientation: Awake alert and oriented x 3 Mood and Affect: Cooperative and  pleasant Gait: Unable to assess due to her fracture Coordination and balance: Within normal limits  Right lower extremity: Leg is shortened externally rotated.  Pain with any attempted movement.  Intact dorsiflex and plantarflex her foot and ankle.  Warm well-perfused foot.  Neuro intact distally.  Previous surgical incision of her right knee is healed well.  Left lower extremity: Skin without lesions. No tenderness to palpation. Full painless ROM, full strength in each muscle groups without evidence of instability.   Medical Decision Making: Data: Imaging: X-rays of the right hip are reviewed which shows a displaced intertrochanteric femur fracture with significant callus angulation.  Labs:  Results for orders placed or performed during the hospital encounter of 03/30/24 (from the past 24 hours)  CBC with Differential     Status: Abnormal   Collection Time: 03/30/24  5:53 PM  Result Value Ref Range   WBC 14.8 (H) 4.0 - 10.5 K/uL   RBC 3.56 (L) 3.87 - 5.11 MIL/uL   Hemoglobin 10.9 (L) 12.0 - 15.0 g/dL   HCT 68.4 (L) 63.9 - 53.9 %   MCV 88.5 80.0 - 100.0 fL   MCH 30.6 26.0 - 34.0 pg   MCHC 34.6 30.0 - 36.0 g/dL   RDW 86.6 88.4 - 84.4 %   Platelets 266 150 - 400 K/uL   nRBC 0.0 0.0 - 0.2 %   Neutrophils Relative % 81 %   Neutro Abs 12.0 (H) 1.7 - 7.7 K/uL   Lymphocytes Relative 9 %   Lymphs Abs 1.4 0.7 - 4.0 K/uL   Monocytes Relative 7 %   Monocytes Absolute 1.0 0.1 - 1.0 K/uL   Eosinophils Relative 2 %   Eosinophils Absolute 0.2 0.0 - 0.5 K/uL   Basophils Relative 0 %   Basophils Absolute 0.1 0.0 - 0.1 K/uL   Immature Granulocytes 1 %   Abs Immature Granulocytes 0.09 (H) 0.00 - 0.07 K/uL  Basic metabolic panel     Status: Abnormal   Collection Time: 03/30/24  5:53 PM  Result Value Ref Range   Sodium 132 (L) 135 - 145 mmol/L   Potassium 3.8 3.5 - 5.1 mmol/L   Chloride 99 98 - 111 mmol/L   CO2 22 22 - 32 mmol/L   Glucose, Bld 257 (H) 70 - 99 mg/dL   BUN 16 8 - 23 mg/dL    Creatinine, Ser 8.94 (H) 0.44 - 1.00 mg/dL   Calcium 9.2 8.9 - 89.6 mg/dL   GFR, Estimated 52 (L) >60 mL/min   Anion gap 11 5 - 15  CBG monitoring, ED     Status: Abnormal   Collection Time: 03/30/24 11:17 PM  Result Value Ref Range   Glucose-Capillary 349 (H) 70 - 99 mg/dL  CBG monitoring, ED     Status: Abnormal   Collection Time: 03/31/24  2:57 AM  Result Value Ref Range   Glucose-Capillary 226 (H) 70 - 99 mg/dL  Basic metabolic panel     Status: Abnormal   Collection Time: 03/31/24  5:00 AM  Result Value Ref Range   Sodium 133 (L) 135 - 145 mmol/L   Potassium 3.7 3.5 - 5.1 mmol/L   Chloride 99 98 - 111 mmol/L   CO2 22 22 - 32 mmol/L   Glucose, Bld 190 (H) 70 - 99 mg/dL   BUN 16 8 - 23 mg/dL   Creatinine, Ser 9.01 0.44 - 1.00 mg/dL   Calcium 8.9 8.9 - 89.6 mg/dL   GFR, Estimated 56 (L) >60 mL/min   Anion gap 12 5 - 15  CBC     Status: Abnormal   Collection Time: 03/31/24  5:00 AM  Result Value Ref Range   WBC 11.3 (H) 4.0 - 10.5 K/uL   RBC 3.36 (L) 3.87 - 5.11 MIL/uL   Hemoglobin 10.4 (L) 12.0 - 15.0 g/dL   HCT 69.4 (L) 63.9 - 53.9 %   MCV 90.8 80.0 - 100.0 fL   MCH 31.0 26.0 - 34.0 pg   MCHC 34.1 30.0 - 36.0 g/dL   RDW 86.4 88.4 - 84.4 %   Platelets 240 150 - 400 K/uL   nRBC 0.0 0.0 - 0.2 %  CBG monitoring, ED     Status: Abnormal   Collection Time: 03/31/24  5:24 AM  Result Value Ref Range   Glucose-Capillary 175 (H) 70 - 99 mg/dL  CBG monitoring, ED     Status: Abnormal   Collection Time: 03/31/24  6:30 AM  Result Value Ref Range   Glucose-Capillary 159 (H) 70 - 99 mg/dL  CBG monitoring, ED     Status: Abnormal   Collection Time: 03/31/24  8:02 AM  Result Value Ref Range   Glucose-Capillary 159 (H) 70 - 99 mg/dL     Imaging or Labs ordered: None  Medical history and chart was reviewed and case discussed with medical provider.  Assessment/Plan: 89 year old female status post ground-level fall with right intertrochanteric femur fracture.  Due to the  unstable nature of her injury I recommend proceeding with cephalomedullary nailing of the right hip.  Risks and benefits were discussed with the patient.  Risks include but not limited to bleeding, infection, malunion, nonunion, hardware failure, heart rotation, nerve and blood vessel injury, DVT, even possible anesthetic complications.  She agreed to proceed with surgery and consent was obtained.  Franky MYRTIS Light, MD [facility] [phone] (office) [email or web address]

## 2024-03-31 NOTE — TOC CAGE-AID Note (Signed)
 Transition of Care [facility]) - CAGE-AID Screening   Patient Details  Name: DEANE MELICK MRN: [id number] Date of Birth: [birth year]  Transition of Care [facility]) CM/SW Contact:    Johana Hopkinson E Schae Cando, LCSW Phone Number: 03/31/2024, 9:51 AM   Clinical Narrative: Disoriented x 2. No SA noted on chart review.   CAGE-AID Screening: Substance Abuse Screening unable to be completed due to: : Patient unable to participate

## 2024-03-31 NOTE — Consult Note (Signed)
 Orthopaedic Trauma Service (OTS) Consult   Patient ID: Jennifer Bailey MRN: [id number] DOB/AGE: May 25, 1935 89 y.o.  Reason for Consult:Right intertrochanteric femur fracture Referring Physician: Dr. Josefa Herring, MD Lloyd Beers  HPI: [name] is an 89 y.o. female who is being seen in consultation at the request of Dr. Herring for evaluation of right intertrochanteric femur fracture.  Patient sustained a ground-level fall.  She was brought to the emergency room where x-rays showed a right intertrochanteric femur fracture.  Due to the OR availability I was asked to manage her primarily.  She is known to me from a previous ORIF of her tibial plateau fracture in 2022.  Patient currently comfortable.  No family at bedside.  She was seen in the preoperative holding area.  She is ambulatory with assist device.  Past Medical History:  Diagnosis Date   Counseling regarding goals of care 12/09/2020   Depression    Diabetes mellitus without complication (HCC)    Fall at home, initial encounter 12/02/2020   Family history of colon cancer- in her brother 07/09/2019   Family history of diabetes mellitus in brother 07/09/2019   Grief 12/24/2022   Hyperlipidemia associated with type 2 diabetes mellitus (HCC)    Hypertension associated with type 2 diabetes mellitus (HCC)    Memory changes    Osteoporosis    Polyneuropathy due to type 2 diabetes mellitus (HCC)    Pressure injury of skin of heel 12/09/2020   Thyroid  disease    Tibia/fibula fracture 12/02/2020   Tremulousness of voice 07/09/2019   Unsteady gait     Past Surgical History:  Procedure Laterality Date   ABDOMINAL HYSTERECTOMY     CHOLECYSTECTOMY     COLON SURGERY     ORIF TIBIA PLATEAU Right 12/04/2020   Procedure: OPEN REDUCTION INTERNAL FIXATION (ORIF) TIBIAL PLATEAU;  Surgeon: Kendal Franky SQUIBB, MD;  Location: [facility] OR;  Service: Orthopedics;  Laterality: Right;    Family History  Problem Relation Age of Onset   Cancer  Mother    Diabetes Brother     Social History:  reports that she has never smoked. She has never used smokeless tobacco. She reports that she does not drink alcohol  and does not use drugs.  Allergies:  Allergies  Allergen Reactions   Macrodantin [Nitrofurantoin Macrocrystal] Swelling   Prednisone     Make real shaky-like, nervious   Penicillins Rash    Medications:  No current facility-administered medications on file prior to encounter.   Current Outpatient Medications on File Prior to Encounter  Medication Sig Dispense Refill   alendronate  (FOSAMAX ) 70 MG tablet Take with a full glass of water on an empty stomach. (Patient taking differently: Take 70 mg by mouth once a week. Take with a full glass of water on an empty stomach.) 4 tablet 11   aspirin  81 MG tablet Take 1 tablet (81 mg total) by mouth daily.     brimonidine  (ALPHAGAN ) 0.2 % ophthalmic solution Place 1 drop into the left eye 2 (two) times daily.     dorzolamidel-timolol  (COSOPT ) 22.3-6.8 MG/ML SOLN ophthalmic solution Place 1 drop into the left eye 2 (two) times daily.     furosemide  (LASIX ) 20 MG tablet Take 1 tablet by mouth every other day. (Patient taking differently: Take 20 mg by mouth daily as needed for fluid.) 45 tablet 1   gabapentin  (NEURONTIN ) 100 MG capsule TAKE 4 CAPSULES (400 MG TOTAL) BY MOUTH AT BEDTIME. (Patient taking differently: Take 300 mg by mouth  at bedtime.) 120 capsule 4   Glucagon  (GVOKE HYPOPEN  1-PACK) 1 MG/0.2ML SOAJ Inject 1 mg into the skin as needed (low blood sugar with impaired consciousness). 0.4 mL 2   insulin  lispro (HUMALOG ) 100 UNIT/ML KwikPen INJECT 3 TO 5 UNITS SUBCUTANEOUSLY BEFORE MEALS AS DIRECTED. (Patient taking differently: Inject 2-3 Units into the skin 3 (three) times daily. INJECT 3 TO 5 UNITS SUBCUTANEOUSLY BEFORE MEALS AS DIRECTED.) 15 mL 11   levothyroxine  (SYNTHROID ) 100 MCG tablet TAKE 1 TABLET (100 MCG TOTAL) BY MOUTH DAILY BEFORE BREAKFAST. 30 tablet 4   losartan   (COZAAR ) 50 MG tablet Take 1 tablet (50 mg total) by mouth daily. 90 tablet 1   lovastatin  (MEVACOR ) 40 MG tablet TAKE 1 TABLET BY MOUTH ONCE DAILY WITH A MEAL. 90 tablet 1   Multiple Vitamins-Minerals (PRESERVISION AREDS 2) CAPS Take 1 capsule by mouth 2 (two) times daily.     Omega-3 1000 MG CAPS Take 2 capsules by mouth 2 (two) times daily.     sitaGLIPtin-metformin (JANUMET ) 50-500 MG tablet Take 1 tablet by mouth 2 (two) times daily with a meal. 60 tablet 3   traZODone  (DESYREL ) 50 MG tablet Take 0.5-1 tablets (25-50 mg total) by mouth at bedtime as needed for sleep. 30 tablet 3   TRESIBA  FLEXTOUCH 100 UNIT/ML FlexTouch Pen INJECT 0.11 MLS (11 UNITS TOTAL) INTO THE SKIN DAILY. 15 mL 3   Alcohol  Swabs (ALCOHOL  PADS) 70 % PADS USE 4 PER DAY 400 each 3   BD PEN NEEDLE NANO U/F 32G X 4 MM MISC USE AS DIRECTED FIVE TIMES A DAY. 200 each 5   Continuous Blood Gluc Receiver (FREESTYLE LIBRE 14 DAY READER) DEVI USE DAILY TO CHECK BLOOD SUGAR 1 each 0   Continuous Glucose Sensor (FREESTYLE LIBRE 2 SENSOR) MISC APPLY TO ARM EVERY 14 DAYS TO MONITOR BLOOD SUGAR. 6 each 3   glucose blood (ONETOUCH ULTRA) test strip Use to check blood sugar once a day if needed 50 each 2   ONETOUCH VERIO test strip USE TO TEST BLOOD SUGAR 4 TIMES DAILY 150 strip 2     ROS: Constitutional: No fever or chills Vision: No changes in vision ENT: No difficulty swallowing CV: No chest pain Pulm: No SOB or wheezing GI: No nausea or vomiting GU: No urgency or inability to hold urine Skin: No poor wound healing Neurologic: No numbness or tingling Psychiatric: No depression or anxiety Heme: No bruising Allergic: No reaction to medications or food   Exam: Blood pressure (!) 130/58, pulse 73, temperature 98 F (36.7 C), temperature source Oral, resp. rate 17, height 4' 10 (1.473 m), weight 58.5 kg, SpO2 100%. General: No acute distress Orientation: Awake alert and oriented x 3 Mood and Affect: Cooperative and  pleasant Gait: Unable to assess due to her fracture Coordination and balance: Within normal limits  Right lower extremity: Leg is shortened externally rotated.  Pain with any attempted movement.  Intact dorsiflex and plantarflex her foot and ankle.  Warm well-perfused foot.  Neuro intact distally.  Previous surgical incision of her right knee is healed well.  Left lower extremity: Skin without lesions. No tenderness to palpation. Full painless ROM, full strength in each muscle groups without evidence of instability.   Medical Decision Making: Data: Imaging: X-rays of the right hip are reviewed which shows a displaced intertrochanteric femur fracture with significant callus angulation.  Labs:  Results for orders placed or performed during the hospital encounter of 03/30/24 (from the past 24 hours)  CBC with Differential     Status: Abnormal   Collection Time: 03/30/24  5:53 PM  Result Value Ref Range   WBC 14.8 (H) 4.0 - 10.5 K/uL   RBC 3.56 (L) 3.87 - 5.11 MIL/uL   Hemoglobin 10.9 (L) 12.0 - 15.0 g/dL   HCT 68.4 (L) 63.9 - 53.9 %   MCV 88.5 80.0 - 100.0 fL   MCH 30.6 26.0 - 34.0 pg   MCHC 34.6 30.0 - 36.0 g/dL   RDW 86.6 88.4 - 84.4 %   Platelets 266 150 - 400 K/uL   nRBC 0.0 0.0 - 0.2 %   Neutrophils Relative % 81 %   Neutro Abs 12.0 (H) 1.7 - 7.7 K/uL   Lymphocytes Relative 9 %   Lymphs Abs 1.4 0.7 - 4.0 K/uL   Monocytes Relative 7 %   Monocytes Absolute 1.0 0.1 - 1.0 K/uL   Eosinophils Relative 2 %   Eosinophils Absolute 0.2 0.0 - 0.5 K/uL   Basophils Relative 0 %   Basophils Absolute 0.1 0.0 - 0.1 K/uL   Immature Granulocytes 1 %   Abs Immature Granulocytes 0.09 (H) 0.00 - 0.07 K/uL  Basic metabolic panel     Status: Abnormal   Collection Time: 03/30/24  5:53 PM  Result Value Ref Range   Sodium 132 (L) 135 - 145 mmol/L   Potassium 3.8 3.5 - 5.1 mmol/L   Chloride 99 98 - 111 mmol/L   CO2 22 22 - 32 mmol/L   Glucose, Bld 257 (H) 70 - 99 mg/dL   BUN 16 8 - 23 mg/dL    Creatinine, Ser 8.94 (H) 0.44 - 1.00 mg/dL   Calcium 9.2 8.9 - 89.6 mg/dL   GFR, Estimated 52 (L) >60 mL/min   Anion gap 11 5 - 15  CBG monitoring, ED     Status: Abnormal   Collection Time: 03/30/24 11:17 PM  Result Value Ref Range   Glucose-Capillary 349 (H) 70 - 99 mg/dL  CBG monitoring, ED     Status: Abnormal   Collection Time: 03/31/24  2:57 AM  Result Value Ref Range   Glucose-Capillary 226 (H) 70 - 99 mg/dL  Basic metabolic panel     Status: Abnormal   Collection Time: 03/31/24  5:00 AM  Result Value Ref Range   Sodium 133 (L) 135 - 145 mmol/L   Potassium 3.7 3.5 - 5.1 mmol/L   Chloride 99 98 - 111 mmol/L   CO2 22 22 - 32 mmol/L   Glucose, Bld 190 (H) 70 - 99 mg/dL   BUN 16 8 - 23 mg/dL   Creatinine, Ser 9.01 0.44 - 1.00 mg/dL   Calcium 8.9 8.9 - 89.6 mg/dL   GFR, Estimated 56 (L) >60 mL/min   Anion gap 12 5 - 15  CBC     Status: Abnormal   Collection Time: 03/31/24  5:00 AM  Result Value Ref Range   WBC 11.3 (H) 4.0 - 10.5 K/uL   RBC 3.36 (L) 3.87 - 5.11 MIL/uL   Hemoglobin 10.4 (L) 12.0 - 15.0 g/dL   HCT 69.4 (L) 63.9 - 53.9 %   MCV 90.8 80.0 - 100.0 fL   MCH 31.0 26.0 - 34.0 pg   MCHC 34.1 30.0 - 36.0 g/dL   RDW 86.4 88.4 - 84.4 %   Platelets 240 150 - 400 K/uL   nRBC 0.0 0.0 - 0.2 %  CBG monitoring, ED     Status: Abnormal   Collection Time: 03/31/24  5:24 AM  Result Value Ref Range   Glucose-Capillary 175 (H) 70 - 99 mg/dL  CBG monitoring, ED     Status: Abnormal   Collection Time: 03/31/24  6:30 AM  Result Value Ref Range   Glucose-Capillary 159 (H) 70 - 99 mg/dL  CBG monitoring, ED     Status: Abnormal   Collection Time: 03/31/24  8:02 AM  Result Value Ref Range   Glucose-Capillary 159 (H) 70 - 99 mg/dL     Imaging or Labs ordered: None  Medical history and chart was reviewed and case discussed with medical provider.  Assessment/Plan: 89 year old female status post ground-level fall with right intertrochanteric femur fracture.  Due to the  unstable nature of her injury I recommend proceeding with cephalomedullary nailing of the right hip.  Risks and benefits were discussed with the patient.  Risks include but not limited to bleeding, infection, malunion, nonunion, hardware failure, heart rotation, nerve and blood vessel injury, DVT, even possible anesthetic complications.  She agreed to proceed with surgery and consent was obtained.  Franky MYRTIS Light, MD [facility] [phone] (office) [email or web address]

## 2024-03-31 NOTE — Anesthesia Preprocedure Evaluation (Addendum)
 Anesthesia Evaluation  Patient identified by MRN, date of birth, ID band Patient awake    Reviewed: Allergy & Precautions, NPO status , Patient's Chart, lab work & pertinent test results  Airway Mallampati: II  TM Distance: >3 FB Neck ROM: Full    Dental  (+) Dental Advisory Given   Pulmonary neg pulmonary ROS   breath sounds clear to auscultation       Cardiovascular hypertension, Pt. on medications  Rhythm:Regular Rate:Normal     Neuro/Psych  Neuromuscular disease    GI/Hepatic negative GI ROS, Neg liver ROS,,,  Endo/Other  diabetes, Insulin  DependentHypothyroidism    Renal/GU Renal InsufficiencyRenal disease     Musculoskeletal   Abdominal   Peds  Hematology  (+) Blood dyscrasia, anemia   Anesthesia Other Findings   Reproductive/Obstetrics                             Anesthesia Physical Anesthesia Plan  ASA: 3  Anesthesia Plan: General   Post-op Pain Management: Ofirmev  IV (intra-op)*   Induction: Intravenous  PONV Risk Score and Plan: 3 and Dexamethasone, Ondansetron  and Treatment may vary due to age or medical condition  Airway Management Planned: Oral ETT  Additional Equipment:   Intra-op Plan:   Post-operative Plan: Extubation in OR  Informed Consent: I have reviewed the patients History and Physical, chart, labs and discussed the procedure including the risks, benefits and alternatives for the proposed anesthesia with the patient or authorized representative who has indicated his/her understanding and acceptance.     Dental advisory given  Plan Discussed with: CRNA  Anesthesia Plan Comments:        Anesthesia Quick Evaluation

## 2024-03-31 NOTE — Interval H&P Note (Signed)
 History and Physical Interval Note:  03/31/2024 10:28 AM  Alfrieda JULIANNA Kerns  has presented today for surgery, with the diagnosis of right intertrochanteric fracture.  The various methods of treatment have been discussed with the patient and family. After consideration of risks, benefits and other options for treatment, the patient has consented to  Procedure(s): FIXATION, FRACTURE, INTERTROCHANTERIC, WITH INTRAMEDULLARY ROD (Right) as a surgical intervention.  The patient's history has been reviewed, patient examined, no change in status, stable for surgery.  I have reviewed the patient's chart and labs.  Questions were answered to the patient's satisfaction.     Maraki Macquarrie P Dushawn Pusey

## 2024-03-31 NOTE — Transfer of Care (Signed)
 Immediate Anesthesia Transfer of Care Note  Patient: Jennifer Bailey  Procedure(s) Performed: FIXATION, FRACTURE, INTERTROCHANTERIC, WITH INTRAMEDULLARY ROD (Right)  Patient Location: PACU  Anesthesia Type:General  Level of Consciousness: awake, alert , and oriented  Airway & Oxygen Therapy: Patient Spontanous Breathing and Patient connected to nasal cannula oxygen  Post-op Assessment: Report given to RN and Post -op Vital signs reviewed and stable  Post vital signs: Reviewed and stable  Last Vitals:  Vitals Value Taken Time  BP 121/93 03/31/24 12:20  Temp 36.3 C 03/31/24 12:20  Pulse 71 03/31/24 12:22  Resp 16 03/31/24 12:22  SpO2 97 % 03/31/24 12:22  Vitals shown include unfiled device data.  Last Pain:  Vitals:   03/31/24 1043  TempSrc:   PainSc: 3       Patients Stated Pain Goal: 1 (03/31/24 1043)  Complications: No notable events documented.

## 2024-03-31 NOTE — ED Notes (Signed)
 Pt resting, no pain voiced. Pt's dry. No other needs.

## 2024-03-31 NOTE — ED Notes (Signed)
 Pt repositioned in bed comfort.

## 2024-03-31 NOTE — Anesthesia Procedure Notes (Signed)
 Procedure Name: Intubation Date/Time: 03/31/2024 11:12 AM  Performed by: Alen Motto D, CRNAPre-anesthesia Checklist: Patient identified, Emergency Drugs available, Suction available and Patient being monitored Patient Re-evaluated:Patient Re-evaluated prior to induction Oxygen Delivery Method: Circle System Utilized Preoxygenation: Pre-oxygenation with 100% oxygen Induction Type: IV induction Ventilation: Mask ventilation without difficulty Laryngoscope Size: Miller and 3 Grade View: Grade I Tube type: Oral Tube size: 7.0 mm Number of attempts: 1 Airway Equipment and Method: Stylet and Oral airway Placement Confirmation: ETT inserted through vocal cords under direct vision, positive ETCO2 and breath sounds checked- equal and bilateral Secured at: 20 cm Tube secured with: Tape Dental Injury: Teeth and Oropharynx as per pre-operative assessment

## 2024-03-31 NOTE — Progress Notes (Signed)
 Progress Note   Patient: Jennifer Bailey FMW:[id number] DOB: 06-Sep-1935 DOA: 03/30/2024  DOS: the patient was seen and examined on 03/31/2024   Brief hospital course:  89 y.o. female with history of diabetes melitis type II, hypertension, hypothyroidism, hyperlipidemia [place].  Denies hitting head or losing consciousness has been having right hip pain after the fall.   Assessment and Plan:  Acute right intertrochanteric fracture - Secondary to ground-level mechanical fall.  Orthopedic surgery consulted.  Currently n.p.o., likely ORIF later this afternoon.  Acute kidney injury - Likely prerenal etiology given dehydration.  Holding diuretics/ARB.  IV fluid hydration on board.  Monitor urine output recheck kidney function in.  Mild hyponatremia - Likely secondary to above.  IV fluid hydration on board.  Will recheck BMP and magnesium in AM.  Insulin -dependent diabetes mellitus - Tenderness insulin  glargine with insulin  sliding scale on board.  Will order [name].  Last known [name] greater than 10 approximately 4 months ago.  Hypertension - Holding antihypertensives for now.  Will consider restarting after surgery.  Chronic normocytic anemia - Will recheck CBC in a.m. postop.  Hypothyroidism - Continue Synthroid .  Follow-up  Physical debilitation muscle weakness - After surgery, will have PT/OT evaluation.  Likely transition to SNF.    Subjective: Patient resting comfortably this morning.  Still complaining of pain but otherwise in good spirits.  Denies any fever, shortness of breath, chest pain, nausea, vomiting, abdominal pain.  Physical Exam:  Vitals:   03/31/24 0600 03/31/24 0809 03/31/24 0900 03/31/24 1020  BP: 132/70  (!) 130/58 (!) 152/52  Pulse: 80  73 80  Resp: 17  17 18   Temp:  98 F (36.7 C)  98.3 F (36.8 C)  TempSrc:  Oral  Oral  SpO2: 100%  100% 100%  Weight:    58.5 kg  Height:    4' 10 (1.473 m)     GENERAL:  Alert, pleasant, no acute distress, frail HEENT:  EOMI CARDIOVASCULAR:  RRR, no murmurs appreciated RESPIRATORY:  Clear to auscultation, no wheezing, rales, or rhonchi GASTROINTESTINAL:  Soft, nontender, nondistended EXTREMITIES: RLE shortened and externally rotated NEURO:  No new focal deficits appreciated SKIN:  No rashes noted PSYCH:  Appropriate mood and affect     Data Reviewed:  No new imaging to review  Previous records (including but not limited to H&P, progress notes, nursing notes, TOC management) were reviewed in assessment of this patient.  Labs: CBC: Recent Labs  Lab 03/30/24 1753 03/31/24 0500  WBC 14.8* 11.3*  NEUTROABS 12.0*  --   HGB 10.9* 10.4*  HCT 31.5* 30.5*  MCV 88.5 90.8  PLT 266 240   Basic Metabolic Panel: Recent Labs  Lab 03/30/24 1753 03/31/24 0500  NA 132* 133*  K 3.8 3.7  CL 99 99  CO2 22 22  GLUCOSE 257* 190*  BUN 16 16  CREATININE 1.05* 0.98  CALCIUM 9.2 8.9   Liver Function Tests: No results for input(s): AST, ALT, ALKPHOS, BILITOT, PROT, ALBUMIN in the last 168 hours. CBG: Recent Labs  Lab 03/30/24 2317 03/31/24 0257 03/31/24 0524 03/31/24 0630 03/31/24 0802  GLUCAP 349* 226* 175* 159* 159*    Scheduled Meds:  [MAR Hold] brimonidine   1 drop Left Eye BID   chlorhexidine   60 mL Topical Once   chlorhexidine        [MAR Hold] dorzolamide -timolol   1 drop Left Eye BID   [MAR Hold] insulin  aspart  0-9 Units  Subcutaneous Q4H   [MAR Hold] insulin  glargine-yfgn  10 Units Subcutaneous QHS   [MAR Hold] levothyroxine   100 mcg Oral Q0600   [MAR Hold] pravastatin   40 mg Oral q1800   Continuous Infusions:  lactated ringers  10 mL/hr at 03/31/24 1106   PRN Meds:.0.9 % irrigation (POUR BTL), chlorhexidine , [MAR Hold] hydrALAZINE , [MAR Hold] HYDROcodone -acetaminophen , [MAR Hold]  morphine  injection, [MAR Hold] ondansetron , [MAR Hold] traZODone   Family Communication: None at bedside  Disposition: Status  is: Inpatient Remains inpatient appropriate because: Right hip fracture     Time spent: 34 minutes  Length of inpatient stay: 1 days  Author: Carliss LELON Canales, DO 03/31/2024 11:58 AM  For on call review [email or web address].

## 2024-03-31 NOTE — Hospital Course (Signed)
 89 y.o. female with history of diabetes melitis type II, hypertension, hypothyroidism, hyperlipidemia [place] her.  Denies hitting head or losing consciousness has been having right hip pain after the fall.    Assessment and Plan:   Acute right intertrochanteric fracture - Secondary to ground-level mechanical fall.  Orthopedic surgery consulted.  Currently n.p.o., likely ORIF later this afternoon.   Acute kidney injury - Likely prerenal etiology given dehydration.  Holding diuretics/ARB.  IV fluid hydration on board.  Monitor urine output recheck kidney function in.   Mild hyponatremia - Likely secondary to above.  IV fluid hydration on board.  Will recheck BMP and magnesium in AM.   Insulin -dependent diabetes mellitus - Tenderness insulin  glargine with insulin  sliding scale on board.  Will order [name].  Last known [name] greater than 10 approximately 4 months ago.   Hypertension - Holding antihypertensives for now.  Will consider restarting after surgery.   Chronic normocytic anemia - Will recheck CBC in a.m. postop.   Hypothyroidism - Continue Synthroid .  Follow-up   Physical debilitation muscle weakness - After surgery, will have PT/OT evaluation.  Likely transition to SNF.

## 2024-03-31 NOTE — Op Note (Signed)
 Orthopaedic Surgery Operative Note (CSN: [id number] ) Date of Surgery:  03/31/2024  Admit Date: 03/30/2024   Diagnoses: Pre-Op Diagnoses: Right intertrochanteric femur fracture  Post-Op Diagnosis: Same  Procedures: CPT 27245-Cephalomedullary nailing of right intertrochanteric femur fracture  Surgeons : Primary: Kendal Franky SQUIBB, MD  Assistant: Jon Hurst, RNFA  Location: OR 3   Anesthesia: General   Antibiotics: Ancef  2g preop   Tourniquet time: None    Estimated Blood Loss: 75 mL  Complications:* No complications entered in OR log *   Specimens:* No specimens in log *   Implants: Implant Name Type Inv. Item Serial No. Manufacturer Lot No. LRB No. Used Action  NAIL INTERTAN 10X18 130D 10S - [id number] Nail NAIL INTERTAN 10X18 130D 10S  [employer] [id number] Right 1 Implanted  SCREW LAG COMPR KIT 95/90 - [id number] Screw SCREW LAG COMPR KIT 95/90  [employer] [id number] Right 1 Implanted  SCREW TRIGEN LOW PROF 5.0X35 - [id number] Screw SCREW TRIGEN LOW PROF 5.0X35  [employer] [id number] Right 1 Implanted     Indications for Surgery: 89 year old female who sustained a ground-level fall with a right intertrochanteric femur fracture.  Due to the unstable nature of her injury I recommend proceeding with cephalomedullary nailing of the right hip.  Risks and benefits were discussed with the patient and her daughters.  This included but not limited to bleeding, infection, malunion, nonunion, hardware failure, hardware irritation, nerve and blood vessel injury, DVT, and the possibility anesthetic complications.  They agreed to proceed with surgery and consent was obtained.  Operative Findings:   Cephalomedullary nailing of right intertrochanteric femur fracture using [employer] InterTAN 10 x 180 mm nail with a 95 mm lag screw and 90 mm compression screw.  Procedure: The patient was identified in the preoperative holding area.  Consent was confirmed with the patient and their family and all questions were answered. The operative extremity was marked after confirmation with the patient. she was then brought back to the operating room by our anesthesia colleagues.  She was placed under general anesthetic and carefully transferred over to the [facility] table.  All bony prominences were well-padded.  Traction was applied to the right lower extremity and fluoroscopic imaging showed adequate alignment of the fracture.  The right hip and pelvis were then prepped and draped in usual sterile fashion.  A timeout was performed to verify the patient, the procedure, and the extremity.  Preoperative antibiotics were dosed.  Small incision proximal to the greater trochanter was made and carried down through skin and subcutaneous tissue.  I directed the threaded guidewire to tip of the greater trochanter and advanced into the proximal metaphysis.  I then used an entry reamer to enter the medullary canal.  I then passed a 10 x 180 mm nail down the center of the canal.  Unfortunately the canal was very tight and I was not able to advance it appropriately to get the guidewire in the appropriate position.  As result I backed the nail out placed a ball-tipped guidewire and then I reamed up to 11 mm and then passed the nail again.  This time was able to get it in adequate position.  I then directed a threaded guidewire up into the head/neck segment.  I then measured the length and chose to use a 95 mm lag screw.  I drilled the path for the compression screw and placed an antirotation bar.  I then drilled the path for the lag  screw and placed the 95 mm lag screw.  I then compressed approximately 5 mm and was able to statically locked the proximal portion of the nail.  I then used the targeting arm to place a lateral to medial distal interlocking screw.  The targeting arm was removed and final fluoroscopic imaging was obtained.  The incisions were irrigated and  closed with 2-0 Monocryl and Dermabond.  Sterile dressings were applied.  The patient was then awoke from anesthesia and taken to the PACU in stable condition.  Post Op Plan/Instructions: Patient will be weightbearing as tolerated to the right lower extremity.  She will receive postoperative Ancef .  She will receive Lovenox  for DVT prophylaxis and discharged on an oral DOAC.  Will have her mobilize with physical and Occupational Therapy.  I was present and performed the entire surgery.  Franky Light, MD [facility]

## 2024-04-01 DIAGNOSIS — M810 Age-related osteoporosis without current pathological fracture: Secondary | ICD-10-CM | POA: Diagnosis not present

## 2024-04-01 DIAGNOSIS — N179 Acute kidney failure, unspecified: Secondary | ICD-10-CM | POA: Diagnosis not present

## 2024-04-01 DIAGNOSIS — D649 Anemia, unspecified: Secondary | ICD-10-CM | POA: Diagnosis not present

## 2024-04-01 DIAGNOSIS — E11649 Type 2 diabetes mellitus with hypoglycemia without coma: Secondary | ICD-10-CM | POA: Diagnosis not present

## 2024-04-01 LAB — BASIC METABOLIC PANEL WITH GFR
Anion gap: 12 (ref 5–15)
BUN: 13 mg/dL (ref 8–23)
CO2: 22 mmol/L (ref 22–32)
Calcium: 8.8 mg/dL — ABNORMAL LOW (ref 8.9–10.3)
Chloride: 102 mmol/L (ref 98–111)
Creatinine, Ser: 1.32 mg/dL — ABNORMAL HIGH (ref 0.44–1.00)
GFR, Estimated: 39 mL/min — ABNORMAL LOW (ref >60)
Glucose, Bld: 182 mg/dL — ABNORMAL HIGH (ref 70–99)
Potassium: 4.2 mmol/L (ref 3.5–5.1)
Sodium: 136 mmol/L (ref 135–145)

## 2024-04-01 LAB — CBC
HCT: 22.3 % — ABNORMAL LOW (ref 36.0–46.0)
Hemoglobin: 7.5 g/dL — ABNORMAL LOW (ref 12.0–15.0)
MCH: 30.1 pg (ref 26.0–34.0)
MCHC: 33.6 g/dL (ref 30.0–36.0)
MCV: 89.6 fL (ref 80.0–100.0)
Platelets: 209 10*3/uL (ref 150–400)
RBC: 2.49 MIL/uL — ABNORMAL LOW (ref 3.87–5.11)
RDW: 13.5 % (ref 11.5–15.5)
WBC: 11.9 10*3/uL — ABNORMAL HIGH (ref 4.0–10.5)
nRBC: 0 % (ref 0.0–0.2)

## 2024-04-01 LAB — GLUCOSE, CAPILLARY
Glucose-Capillary: 135 mg/dL — ABNORMAL HIGH (ref 70–99)
Glucose-Capillary: 167 mg/dL — ABNORMAL HIGH (ref 70–99)
Glucose-Capillary: 187 mg/dL — ABNORMAL HIGH (ref 70–99)
Glucose-Capillary: 213 mg/dL — ABNORMAL HIGH (ref 70–99)
Glucose-Capillary: 233 mg/dL — ABNORMAL HIGH (ref 70–99)
Glucose-Capillary: 250 mg/dL — ABNORMAL HIGH (ref 70–99)
Glucose-Capillary: 276 mg/dL — ABNORMAL HIGH (ref 70–99)

## 2024-04-01 LAB — MAGNESIUM: Magnesium: 1.9 mg/dL (ref 1.7–2.4)

## 2024-04-01 MED ORDER — ADULT MULTIVITAMIN W/MINERALS CH
1.0000 | ORAL_TABLET | Freq: Every day | ORAL | Status: DC
  Administered 2024-04-01 – 2024-04-13 (×13): 1 via ORAL
  Filled 2024-04-01 (×13): qty 1

## 2024-04-01 MED ORDER — ENOXAPARIN SODIUM 30 MG/0.3ML IJ SOSY
30.0000 mg | PREFILLED_SYRINGE | INTRAMUSCULAR | Status: DC
  Administered 2024-04-02 – 2024-04-04 (×3): 30 mg via SUBCUTANEOUS
  Filled 2024-04-01 (×3): qty 0.3

## 2024-04-01 MED ORDER — GLUCERNA SHAKE PO LIQD
237.0000 mL | Freq: Three times a day (TID) | ORAL | Status: DC
  Administered 2024-04-01 – 2024-04-13 (×29): 237 mL via ORAL

## 2024-04-01 MED ORDER — PROSOURCE PLUS PO LIQD
30.0000 mL | Freq: Two times a day (BID) | ORAL | Status: DC
  Administered 2024-04-01 – 2024-04-13 (×22): 30 mL via ORAL
  Filled 2024-04-01 (×21): qty 30

## 2024-04-01 MED ORDER — VITAMIN D 25 MCG (1000 UNIT) PO TABS
1000.0000 [IU] | ORAL_TABLET | Freq: Every day | ORAL | Status: DC
  Administered 2024-04-01 – 2024-04-13 (×13): 1000 [IU] via ORAL
  Filled 2024-04-01 (×14): qty 1

## 2024-04-01 NOTE — Progress Notes (Signed)
 Initial Nutrition Assessment  DOCUMENTATION CODES:   Not applicable  INTERVENTION:  Encourage PO intake Encourage increased protein intake Room service with assist  Glucerna Shake po TID, each supplement provides 220 kcal and 10 grams of protein Prosource Plus BID, each supplement contains 100 kcal and 15 gm protein  MVI with minerals daily 1,000 units vitamin D  daily  High calorie, high protein handout provided in AVS  NUTRITION DIAGNOSIS:   Increased nutrient needs related to hip fracture as evidenced by estimated needs.  GOAL:   Patient will meet greater than or equal to 90% of their needs   MONITOR:   PO intake, Supplement acceptance  REASON FOR ASSESSMENT:   Consult Assessment of nutrition requirement/status, Hip fracture protocol  ASSESSMENT:  89 y.o. female with PMH of T2DM, HTN, HLD, hypothyroidism, [place] her. Found to have Right hip fracture s/p ORIF 6/27/025.  6/26 - Admitted  6/27 - s/p ORIF of right hip   RD working remotely, attempted to cal pt's room as as well as family with no response.   Pt now POD 1 from ORIF of right hip. RN reports pt seems to have a good appetite had 80% of her dinner last night. Still working on breakfast per Charity fundraiser. Weight seems stable in the last year. Did have a slight decrease in weight since February 2025 going from 135 lbs to currently 129 lbs, a 4% weight loss in 4 months This is not clinically significant but is concerning given that pt now requires increased calorie and protein needs for healing. RD suspects pt will benefit from ONS and protein supplementation.   Of note CBG's not well controlled will supplement using Glucerna however, if intake starts to decline may need to transition to Ensure High protein for added protein.  Admit weight: 58.5 kg Current weight: 58.5 kg    Average Meal Intake: 5/27: 80% intake x 1 recorded meals  Nutritionally Relevant  Medications: Scheduled Meds:  brimonidine   1 drop Left Eye BID   dorzolamide -timolol   1 drop Left Eye BID   enoxaparin  (LOVENOX ) injection  40 mg Subcutaneous Q24H   insulin  aspart  0-9 Units Subcutaneous Q4H   insulin  glargine-yfgn  10 Units Subcutaneous QHS   levothyroxine   100 mcg Oral Q0600   pravastatin   40 mg Oral q1800   Continuous Infusions:   ceFAZolin  (ANCEF ) IV 2 g (04/01/24 0535)   Labs Reviewed: Sodium 133 GFR 56 CBG ranges from 159-413 mg/dL over the last 24 hours HgbA1c 8.9    NUTRITION - FOCUSED PHYSICAL EXAM:  - Deferred to follow up   Diet Order:   Diet Order             Diet regular Room service appropriate? Yes; Fluid consistency: Thin  Diet effective now                   EDUCATION NEEDS:   Not appropriate for education at this time  Skin:  Skin Assessment: Skin Integrity Issues: Skin Integrity Issues:: Incisions, DTI DTI: R heel Incisions: Surgical incsion, right hip  Last BM:  PTA  Height:   Ht Readings from Last 1 Encounters:  03/31/24 4' 10 (1.473 m)    Weight:   Wt Readings from Last 1 Encounters:  03/31/24 58.5 kg    BMI:  Body mass index is 26.96 kg/m.  Estimated Nutritional Needs:   Kcal:  1500-1800 kcal  Protein:  80-100 gm  Fluid:  >  1.5L/day   Olivia Kenning, RD Registered Dietitian  See Amion for more information

## 2024-04-01 NOTE — Evaluation (Signed)
 Occupational Therapy Evaluation Patient Details Name: Jennifer Bailey MRN: [id number] DOB: [birth year] Today's Date: 04/01/2024   History of Present Illness   Pt is an 89 y/o F presenting to ED on 6/26 with R hip pain after fall, found to have R intertrochanteric hip fx s/p IMN on 6/27. PMH includes DM2, hypothyroidism, HTN, HLD     Clinical Impressions Pt reports use of RW occasionally at baseline, ind with ADL, lives alone and does not drive. Pt currently needing up to max A For ADLs, mod +2 for bed mobility and min-mod +2 for step-pivot transfers with RW. Pt transferring from bed  > BSC > chair, needing incr assist from lower (bed) surfaces. Pt presenting with impairments listed below, will follow acutely. Patient will benefit from continued inpatient follow up therapy, <3 hours/day to maximize safety/ind with ADL/functional mobility.      If plan is discharge home, recommend the following:   A lot of help with walking and/or transfers;A lot of help with bathing/dressing/bathroom;Assistance with cooking/housework;Direct supervision/assist for medications management;Direct supervision/assist for financial management;Assist for transportation;Help with stairs or ramp for entrance     Functional Status Assessment   Patient has had a recent decline in their functional status and demonstrates the ability to make significant improvements in function in a reasonable and predictable amount of time.     Equipment Recommendations   Other (comment) (defer)     Recommendations for Other Services   PT consult     Precautions/Restrictions   Precautions Precautions: Fall Restrictions Weight Bearing Restrictions Per Provider Order: Yes RLE Weight Bearing Per Provider Order: Weight bearing as tolerated     Mobility Bed Mobility Overal bed mobility: Needs Assistance Bed Mobility: Supine to Sit     Supine to sit: Mod assist, +2 for physical assistance           Transfers Overall transfer level: Needs assistance Equipment used: Rolling walker (2 wheels) Transfers: Sit to/from Stand, Bed to chair/wheelchair/BSC Sit to Stand: Min assist, Mod assist, +2 physical assistance     Step pivot transfers: Min assist, Mod assist, +2 physical assistance     General transfer comment: min from BSC surface, mod from bed      Balance Overall balance assessment: Needs assistance Sitting-balance support: Feet supported Sitting balance-Leahy Scale: Fair     Standing balance support: During functional activity, Reliant on assistive device for balance Standing balance-Leahy Scale: Poor                             ADL either performed or assessed with clinical judgement   ADL Overall ADL's : Needs assistance/impaired Eating/Feeding: Set up;Sitting   Grooming: Minimal assistance;Sitting   Upper Body Bathing: Moderate assistance;Sitting   Lower Body Bathing: Maximal assistance;Sitting/lateral leans   Upper Body Dressing : Moderate assistance;Sitting   Lower Body Dressing: Maximal assistance;Sitting/lateral leans;Sit to/from stand   Toilet Transfer: Minimal assistance;Moderate assistance;BSC/3in1;Rolling walker (2 wheels);Stand-pivot   Toileting- Clothing Manipulation and Hygiene: Maximal assistance       Functional mobility during ADLs: Maximal assistance;Rolling walker (2 wheels)       Vision   Vision Assessment?: No apparent visual deficits     Perception Perception: Not tested       Praxis Praxis: Not tested       Pertinent Vitals/Pain Pain Assessment Pain Assessment: Faces Pain Score: 5  Faces Pain Scale: Hurts even more Pain Location: RLE Pain Descriptors / Indicators: Discomfort Pain Intervention(s):  Limited activity within patient's tolerance, Monitored during session, Premedicated before session, Repositioned     Extremity/Trunk Assessment Upper Extremity Assessment Upper Extremity Assessment:  Generalized weakness   Lower Extremity Assessment Lower Extremity Assessment: Defer to PT evaluation   Cervical / Trunk Assessment Cervical / Trunk Assessment: Other exceptions (head tremoring noted)   Communication Communication Communication: No apparent difficulties   Cognition Arousal: Alert Behavior During Therapy: WFL for tasks assessed/performed Cognition: No family/caregiver present to determine baseline             OT - Cognition Comments: some decr STM, needs incr time/cues to recall events leading up to hospitalization                 Following commands: Intact       Cueing  General Comments   Cueing Techniques: Verbal cues  VSS   Exercises     Shoulder Instructions      Home Living Family/patient expects to be discharged to:: Private residence Living Arrangements: Alone Available Help at Discharge: Family;Available PRN/intermittently Type of Home: House Home Access: Level entry (level entry at back entrance)     Home Layout: One level     Bathroom Shower/Tub: Walk-in shower         Home Equipment: Shower seat - built in;Grab bars - Patent attorney (2 wheels)          Prior Functioning/Environment Prior Level of Function : Independent/Modified Independent             Mobility Comments: RW sometimes ADLs Comments: ind    OT Problem List: Decreased range of motion;Decreased strength;Decreased activity tolerance;Impaired balance (sitting and/or standing);Decreased cognition;Pain   OT Treatment/Interventions: Self-care/ADL training;Therapeutic exercise;Energy conservation;DME and/or AE instruction;Therapeutic activities;Balance training;Patient/family education;Cognitive remediation/compensation      OT Goals(Current goals can be found in the care plan section)   Acute Rehab OT Goals Patient Stated Goal: none stated OT Goal Formulation: With patient Time For Goal Achievement: 04/15/24 Potential to  Achieve Goals: Good   OT Frequency:  Min 2X/week    Co-evaluation PT/OT/SLP Co-Evaluation/Treatment: Yes Reason for Co-Treatment: Complexity of the patient's impairments (multi-system involvement);Necessary to address cognition/behavior during functional activity;To address functional/ADL transfers   OT goals addressed during session: ADL's and self-care      AM-PAC OT 6 Clicks Daily Activity     Outcome Measure Help from another person eating meals?: A Little Help from another person taking care of personal grooming?: A Little Help from another person toileting, which includes using toliet, bedpan, or urinal?: A Lot Help from another person bathing (including washing, rinsing, drying)?: A Lot Help from another person to put on and taking off regular upper body clothing?: A Lot Help from another person to put on and taking off regular lower body clothing?: A Lot 6 Click Score: 14   End of Session Equipment Utilized During Treatment: Gait belt;Rolling walker (2 wheels) Nurse Communication: Mobility status  Activity Tolerance: Patient tolerated treatment well Patient left: in chair;with call bell/phone within reach;with chair alarm set  OT Visit Diagnosis: Unsteadiness on feet (R26.81);Other abnormalities of gait and mobility (R26.89);Muscle weakness (generalized) (M62.81)                Time: 9050-8984 OT Time Calculation (min): 26 min Charges:  OT General Charges $OT Visit: 1 Visit OT Evaluation $OT Eval Moderate Complexity: 1 Mod  Nahjae Hoeg K, OTD, OTR/L SecureChat Preferred Acute Rehab [phone]   Laneta POUR Koonce 04/01/2024, 12:13 PM

## 2024-04-01 NOTE — Progress Notes (Signed)
 Progress Note   Patient: Jennifer Bailey FMW:[id number] DOB: Feb 16, 1935 DOA: 03/30/2024  DOS: the patient was seen and examined on 04/01/2024   Brief hospital course:  89 y.o. female with history of diabetes melitis type II, hypertension, hypothyroidism, hyperlipidemia [place] her.  Denies hitting head or losing consciousness has been having right hip pain after the fall.    Assessment and Plan:   Acute right intertrochanteric fracture - Secondary to ground-level mechanical fall.  Orthopedic surgery consulted and following closely.  Status post intramedullary nail.  Weightbearing as tolerated RLE.  Continues on Lovenox  for DVT prophylaxis.  Can follow-up with orthopedic surgery Dr. Kendal in the outpatient setting in 2 weeks.  Acute kidney injury - Initial presentation likely prerenal etiology given dehydration.  Holding diuretics/ARB.  IV fluid hydration on board.  Currently worsening this morning likely due to surgery.  Will monitor urine output recheck BMP and magnesium in AM.   Mild hyponatremia - Likely secondary to above.  IV fluid hydration on board.  Showing improvement.  Will recheck BMP and magnesium in AM.   Insulin -dependent diabetes mellitus - Tenderness insulin  glargine with insulin  sliding scale on board.  [name] 8.9 suggesting poor control but improving..  Last known [name] greater than 10 approximately 4 months ago.   Hypertension - Holding antihypertensives for now.  Will consider restarting after surgery.   Chronic normocytic anemia - Will recheck CBC in a.m. postop.   Hypothyroidism - Continue Synthroid .  Follow-up   Physical debilitation muscle weakness - PT/OT on board.  Likely transition to SNF.   Subjective: Patient resting comfortably this morning.  Denied any fever, chills, chest pain, nausea, vomiting, abdominal pain.  Does have pain in her right hip but otherwise well-controlled.  Physical Exam:  Vitals:    03/31/24 1943 04/01/24 0504 04/01/24 0800 04/01/24 0844  BP: (!) 121/49 (!) 127/45 (!) 135/51 92/62  Pulse: 76 73 75 76  Resp: 15 15  15   Temp: 98.2 F (36.8 C) (!) 97.5 F (36.4 C) 98.6 F (37 C) 98.8 F (37.1 C)  TempSrc:    Oral  SpO2: 96% 97% 98% 99%  Weight:      Height:        GENERAL:  Alert, pleasant, no acute distress, frail HEENT:  EOMI CARDIOVASCULAR:  RRR, no murmurs appreciated RESPIRATORY:  Clear to auscultation, no wheezing, rales, or rhonchi GASTROINTESTINAL:  Soft, nontender, nondistended EXTREMITIES: RLE shortened and externally rotated NEURO:  No new focal deficits appreciated SKIN:  No rashes noted PSYCH:  Appropriate mood and affect    Data Reviewed:  No new imaging to review at this time  Previous records (including but not limited to H&P, progress notes, nursing notes, TOC management) were reviewed in assessment of this patient.  Labs: CBC: Recent Labs  Lab 03/30/24 1753 03/31/24 0500 04/01/24 0824  WBC 14.8* 11.3* 11.9*  NEUTROABS 12.0*  --   --   HGB 10.9* 10.4* 7.5*  HCT 31.5* 30.5* 22.3*  MCV 88.5 90.8 89.6  PLT 266 240 209   Basic Metabolic Panel: Recent Labs  Lab 03/30/24 1753 03/31/24 0500 04/01/24 0824  NA 132* 133* 136  K 3.8 3.7 4.2  CL 99 99 102  CO2 22 22 22   GLUCOSE 257* 190* 182*  BUN 16 16 13   CREATININE 1.05* 0.98 1.32*  CALCIUM 9.2 8.9 8.8*  MG  --   --  1.9   Liver Function Tests: No  results for input(s): AST, ALT, ALKPHOS, BILITOT, PROT, ALBUMIN in the last 168 hours. CBG: Recent Labs  Lab 03/31/24 2210 04/01/24 0044 04/01/24 0444 04/01/24 0849 04/01/24 1156  GLUCAP 329* 250* 167* 187* 233*    Scheduled Meds:  (feeding supplement) PROSource Plus  30 mL Oral BID BM   brimonidine   1 drop Left Eye BID   cholecalciferol  1,000 Units Oral Daily   dorzolamide -timolol   1 drop Left Eye BID   [START ON 04/02/2024] enoxaparin  (LOVENOX ) injection  30 mg Subcutaneous Q24H   feeding supplement  (GLUCERNA SHAKE)  237 mL Oral TID BM   insulin  aspart  0-9 Units Subcutaneous Q4H   insulin  glargine-yfgn  10 Units Subcutaneous QHS   levothyroxine   100 mcg Oral Q0600   multivitamin with minerals  1 tablet Oral Daily   pravastatin   40 mg Oral q1800   Continuous Infusions:   ceFAZolin  (ANCEF ) IV 2 g (04/01/24 1333)   PRN Meds:.hydrALAZINE , HYDROcodone -acetaminophen , morphine  injection, ondansetron , traZODone   Family Communication: Daughter via telephone  Disposition: Status is: Inpatient Remains inpatient appropriate because: Right hip fracture     Time spent: 40 minutes  Length of inpatient stay: 2 days  Author: Carliss LELON Canales, DO 04/01/2024 1:40 PM  For on call review [email or web address].

## 2024-04-01 NOTE — Discharge Instructions (Signed)
 Orthopaedic Trauma Service Discharge Instructions   General Discharge Instructions  WEIGHT BEARING STATUS:weightbearing as tolerated  RANGE OF MOTION/ACTIVITY: Unrestricted motion of the hip and knee  Wound Care: You may remove your surgical dressings. Incisions can be left open to air if there is no drainage. Once the incision is completely dry and without drainage, it may be left open to air out.  Showering may begin post op day 3 (Monday 04/03/24).  Clean incision gently with soap and water.  DVT/PE prophylaxis: Eliquis twice daily x 30 days  Diet: as you were eating previously.  Can use over the counter stool softeners and bowel preparations, such as Miralax , to help with bowel movements.  Narcotics can be constipating.  Be sure to drink plenty of fluids  PAIN MEDICATION USE AND EXPECTATIONS  You have likely been given narcotic medications to help control your pain.  After a traumatic event that results in an fracture (broken bone) with or without surgery, it is ok to use narcotic pain medications to help control one's pain.  We understand that everyone responds to pain differently and each individual patient will be evaluated on a regular basis for the continued need for narcotic medications. Ideally, narcotic medication use should last no more than 6-8 weeks (coinciding with fracture healing).   As a patient it is your responsibility as well to monitor narcotic medication use and report the amount and frequency you use these medications when you come to your office visit.   We would also advise that if you are using narcotic medications, you should take a dose prior to therapy to maximize you participation.  IF YOU ARE ON NARCOTIC MEDICATIONS IT IS NOT PERMISSIBLE TO OPERATE A MOTOR VEHICLE (MOTORCYCLE/CAR/TRUCK/MOPED) OR HEAVY MACHINERY DO NOT MIX NARCOTICS WITH OTHER CNS (CENTRAL NERVOUS SYSTEM) DEPRESSANTS SUCH AS ALCOHOL   POST-OPERATIVE OPIOID TAPER INSTRUCTIONS: It is important  to wean off of your opioid medication as soon as possible. If you do not need pain medication after your surgery it is ok to stop day one. Opioids include: Codeine, Hydrocodone (Norco, Vicodin), Oxycodone (Percocet, oxycontin ) and hydromorphone  amongst others.  Long term and even short term use of opiods can cause: Increased pain response Dependence Constipation Depression Respiratory depression And more.  Withdrawal symptoms can include Flu like symptoms Nausea, vomiting And more Techniques to manage these symptoms Hydrate well Eat regular healthy meals Stay active Use relaxation techniques(deep breathing, meditating, yoga) Do Not substitute Alcohol  to help with tapering If you have been on opioids for less than two weeks and do not have pain than it is ok to stop all together.  Plan to wean off of opioids This plan should start within one week post op of your fracture surgery  Maintain the same interval or time between taking each dose and first decrease the dose.  Cut the total daily intake of opioids by one tablet each day Next start to increase the time between doses. The last dose that should be eliminated is the evening dose.    STOP SMOKING OR USING NICOTINE PRODUCTS!!!!  As discussed nicotine severely impairs your body's ability to heal surgical and traumatic wounds but also impairs bone healing.  Wounds and bone heal by forming microscopic blood vessels (angiogenesis) and nicotine is a vasoconstrictor (essentially, shrinks blood vessels).  Therefore, if vasoconstriction occurs to these microscopic blood vessels they essentially disappear and are unable to deliver necessary nutrients to the healing tissue.  This is one modifiable factor that you can do to dramatically  increase your chances of healing your injury.  (This means no smoking, no nicotine gum, patches, etc)  DO NOT USE NONSTEROIDAL ANTI-INFLAMMATORY DRUGS (NSAID'S)  Using products such as Advil (ibuprofen), Aleve  (naproxen), Motrin (ibuprofen) for additional pain control during fracture healing can delay and/or prevent the healing response.  If you would like to take over the counter (OTC) medication, Tylenol  (acetaminophen ) is ok.  However, some narcotic medications that are given for pain control contain acetaminophen  as well. Therefore, you should not exceed more than 4000 mg of tylenol  in a day if you do not have liver disease.  Also note that there are may OTC medicines, such as cold medicines and allergy medicines that my contain tylenol  as well.  If you have any questions about medications and/or interactions please ask your doctor/PA or your pharmacist.      ICE AND ELEVATE INJURED/OPERATIVE EXTREMITY  Using ice and elevating the injured extremity above your heart can help with swelling and pain control.  Icing in a pulsatile fashion, such as 20 minutes on and 20 minutes off, can be followed.    Do not place ice directly on skin. Make sure there is a barrier between to skin and the ice pack.    Using frozen items such as frozen peas works well as the conform nicely to the are that needs to be iced.  USE AN ACE WRAP OR TED HOSE FOR SWELLING CONTROL  In addition to icing and elevation, Ace wraps or TED hose are used to help limit and resolve swelling.  It is recommended to use Ace wraps or TED hose until you are informed to stop.    When using Ace Wraps start the wrapping distally (farthest away from the body) and wrap proximally (closer to the body)   Example: If you had surgery on your leg or thing and you do not have a splint on, start the ace wrap at the toes and work your way up to the thigh        If you had surgery on your upper extremity and do not have a splint on, start the ace wrap at your fingers and work your way up to the upper arm  CALL THE OFFICE FOR MEDICATION REFILLS OR WITH ANY QUESTIONS/CONCERNS: [phone]   VISIT OUR WEBSITE FOR ADDITIONAL INFORMATION:  [email or web address]   Discharge Wound Care Instructions  Do NOT apply any ointments, solutions or lotions to pin sites or surgical wounds.  These prevent needed drainage and even though solutions like hydrogen peroxide kill bacteria, they also damage cells lining the pin sites that help fight infection.  Applying lotions or ointments can keep the wounds moist and can cause them to breakdown and open up as well. This can increase the risk for infection. When in doubt call the office.  Surgical incisions should be dressed daily.  If any drainage is noted, use one layer of adaptic or Mepitel, then gauze, Kerlix, and an ace wrap. - These dressing supplies should be available at local medical supply stores ([facility], [facility], etc) as well as Insurance claims handler ([facility], [facility], [facility], etc)  Once the incision is completely dry and without drainage, it may be left open to air out.  Showering may begin 36-48 hours later.  Cleaning gently with soap and water.   Call office for the following: Temperature greater than 101F Persistent nausea and vomiting Severe uncontrolled pain Redness, tenderness, or signs of infection (pain, swelling, redness, odor or green/yellow discharge around the  site) Difficulty breathing, headache or visual disturbances Hives Persistent dizziness or light-headedness Extreme fatigue Any other questions or concerns you may have after discharge  In an emergency, call 911 or go to an Emergency Department at a nearby hospital  OTHER HELPFUL INFORMATION  If you had a block, it will wear off between 8-24 hrs postop typically.  This is period when your pain may go from nearly zero to the pain you would have had postop without the block.  This is an abrupt transition but nothing dangerous is happening.  You may take an extra dose of narcotic when this happens.  You should wean off your narcotic medicines as soon as you are able.  Most patients will be off or using  minimal narcotics before their first postop appointment.   We suggest you use the pain medication the first night prior to going to bed, in order to ease any pain when the anesthesia wears off. You should avoid taking pain medications on an empty stomach as it will make you nauseous.  Do not drink alcoholic beverages or take illicit drugs when taking pain medications.  In most states it is against the law to drive while you are in a splint or sling.  And certainly against the law to drive while taking narcotics.  You may return to work/school in the next couple of days when you feel up to it.   Pain medication may make you constipated.  Below are a few solutions to try in this order: Decrease the amount of pain medication if you aren't having pain. Drink lots of decaffeinated fluids. Drink prune juice and/or each dried prunes  If the first 3 don't work start with additional solutions Take Colace - an over-the-counter stool softener Take Senokot - an over-the-counter laxative Take Miralax  - a stronger over-the-counter laxative      High-Protein and High-Calorie Diet Eating high-protein and high-calorie foods can help you to gain weight, heal after an injury, and get better after an illness or surgery. The amount of daily protein and calories you need depends on: Your body weight. The reason you were told to follow this diet. Usually, a high-protein, high-calorie diet means that you should: Eat 250-500 extra calories each day. Make sure that you get enough of your daily calories from protein. Ask your health care provider how many of your calories should come from protein and how many calories total you need each day. Follow the diet as told by your provider. What are tips for following this plan? Reading food labels Check the nutrition facts label for calories, and grams of fat and protein. Items with more than 4 grams of protein are high-protein foods. General information  Ask  your provider if you should take a nutritional supplement. Try to eat six small meals each day instead of three large meals. A goal is usually to eat every 2 to 3 hours. Eat a balanced diet. In each meal, include one food that's high in protein and one food with fat in it. Keep nutritious snacks available, such as nuts, trail mixes, dried fruit, and whole-milk yogurt. If you have kidney disease or diabetes, talk with your provider about how much protein is safe for you. Too much protein may put extra stress on your kidneys. Replace zero-calorie drinks with drinks that have calories in them, such as milk and 100% fruit juice. Consider setting a timer to remind you to eat. You'll want to eat even if you do not feel very  hungry. Preparing meals Milk and dairy foods. Add whole milk, half-and-half, or heavy cream to cereal, pudding, soup, or hot cocoa. Add whole milk to instant breakfast drinks. Add powdered milk to baked goods, smoothies, or milkshakes. Add powdered milk, cream, or butter to mashed potatoes. Replace water with milk or heavy cream when making foods such as oatmeal, pudding, or cocoa. Make cream-based pastas and soups. Add cheese to cooked vegetables. Make whole-milk yogurt parfaits. Top them with granola, fruit, or nuts. Add cottage cheese to fruit. Add cream cheese to sandwiches or as a topping on crackers and bread. Eggs. Add hard-boiled eggs to salads. Keep hard-boiled eggs in the fridge to snack on. Add cheese to cooked eggs. Beans, nuts, and seeds. Add peanut butter to oatmeal or smoothies. Use peanut butter as a dip for fruits and vegetables or as a topping for pretzels, celery, or crackers. Add beans to casseroles, dips, and spreads. Add pureed beans to sauces and soups. Salads, soups, and other foods. Add avocado, cheese, or both to sandwiches or salads. Add avocado to smoothies. Add meat, poultry, or seafood to rice, pasta, casseroles, salads, and soups. Use  mayonnaise when making egg salad, chicken salad, or tuna salad. Add oil or butter to cooked vegetables and grains. What high-protein foods should I eat?  Vegetables Soybeans. Peas. Grains Quinoa. Bulgur wheat. Buckwheat. Meats and other proteins Beef, pork, and poultry. Fish and seafood. Eggs. Tofu. Textured vegetable protein (TVP). Peanut butter. Nuts and seeds. Dried beans. Protein powders. Hummus. Jerky. Dairy Whole milk. Whole-milk yogurt. Powdered milk. Cheese. Cottage cheese. Eggnog. Beverages High-protein supplement drinks. Soy milk. Other foods Protein bars. The items listed above may not be all the foods and drinks you can have. Talk with an expert in healthy eating called a dietitian to learn more. What high-calorie foods should I eat? Fruits Dried fruit. Fruit leather. Canned fruit in syrup. Fruit juice. Avocado. Vegetables Vegetables cooked in oil or butter. Fried potatoes. Grains Pasta. Quick breads. Muffins. Pancakes. Granola. Meats and other proteins Peanut butter and other nut butters. Nuts and seeds. Dairy Heavy cream. Whipped cream. Cream cheese. Sour cream. Ice cream. Custard. Pudding. Whole-milk dairy products. Beverages Meal-replacement beverages. Nutrition shakes. Fruit juice. Seasonings and condiments Salad dressing. Mayonnaise. Alfredo sauce. Fruit preserves or jelly. Honey. Syrup. Sweets and desserts Cake. Cookies. Pie. Pastries. Candy bars. Chocolate. Fats and oils Butter or margarine. Oil. Gravy. Other foods Meal-replacement bars. The items listed above may not be all the foods and drinks you can have. Talk with an expert in healthy eating to learn more. This information is not intended to replace advice given to you by your health care provider. Make sure you discuss any questions you have with your health care provider. Document Revised: 02/15/2023 Document Reviewed: 02/15/2023 Elsevier Patient Education  2024 [employer].  Information on my  medicine - ELIQUIS (apixaban)  Why was Eliquis prescribed for you? Eliquis was prescribed for you to reduce the risk of blood clots forming after orthopedic surgery.    What do You need to know about Eliquis? Take your Eliquis TWICE DAILY - one tablet in the morning and one tablet in the evening with or without food.  It would be best to take the dose about the same time each day.  If you have difficulty swallowing the tablet whole please discuss with your pharmacist how to take the medication safely.  Take Eliquis exactly as prescribed by your doctor and DO NOT stop taking Eliquis without talking to the doctor  who prescribed the medication.  Stopping without other medication to take the place of Eliquis may increase your risk of developing a clot.  After discharge, you should have regular check-up appointments with your healthcare provider that is prescribing your Eliquis.  What do you do if you miss a dose? If a dose of ELIQUIS is not taken at the scheduled time, take it as soon as possible on the same day and twice-daily administration should be resumed.  The dose should not be doubled to make up for a missed dose.  Do not take more than one tablet of ELIQUIS at the same time.  Important Safety Information A possible side effect of Eliquis is bleeding. You should call your healthcare provider right away if you experience any of the following: Bleeding from an injury or your nose that does not stop. Unusual colored urine (red or dark brown) or unusual colored stools (red or black). Unusual bruising for unknown reasons. A serious fall or if you hit your head (even if there is no bleeding).  Some medicines may interact with Eliquis and might increase your risk of bleeding or clotting while on Eliquis. To help avoid this, consult your healthcare provider or pharmacist prior to using any new prescription or non-prescription medications, including herbals, vitamins, non-steroidal  anti-inflammatory drugs (NSAIDs) and supplements.  This website has more information on Eliquis (apixaban): http://www.eliquis.com/eliquis/home

## 2024-04-01 NOTE — Progress Notes (Signed)
 ORTHOPAEDIC PROGRESS NOTE  s/p Procedure(s): FIXATION, FRACTURE, INTERTROCHANTERIC, WITH INTRAMEDULLARY ROD  SUBJECTIVE: Reports mild pain about operative site. Asking about her daughter Jennifer Bailey  OBJECTIVE: PE: General: sitting in recliner, confused RLE: incision CDI, leg lengths equal, warm well perfused foot, intact EHL/TA/GSC   Vitals:   04/01/24 0800 04/01/24 0844  BP: (!) 135/51 92/62  Pulse: 75 76  Resp:  15  Temp: 98.6 F (37 C) 98.8 F (37.1 C)  SpO2: 98% 99%     ASSESSMENT: Jennifer Bailey is a 89 y.o. female PoD#1  PLAN: Weightbearing: WBAT RLE Insicional and dressing care: Reinforce dressings as needed Orthopedic device(s): None Showering: Hold for now VTE prophylaxis: Lovenox   Pain control: PRN pain medication, minimize narcotics as able. Decrease usage due to patient's confusion Follow - up plan: 2 weeks in office with Dr. Kendal Dispo: Likely SNF Contact information:   After hours and holidays please check [email or web address] for group call information for [facility]   Aleck Stalling, PA-C 04/01/24

## 2024-04-01 NOTE — Evaluation (Signed)
 Physical Therapy Evaluation Patient Details Name: JASMANE BROCKWAY MRN: [id number] DOB: Mar 21, 1935 Today's Date: 04/01/2024  History of Present Illness  Pt is an 89 y/o F presenting to ED on 6/26 with R hip pain after fall, found to have R intertrochanteric hip fx s/p IMN on 6/27. PMH includes DM2, hypothyroidism, HTN, HLD  Clinical Impression  Pt presents to PT with deficits in functional mobility, gait, balance, strength, power, endurance. Pt is limited by RLE weakness and pain, although able to tolerate multiple transfers with physical assistance from PT/OT and with support of RW. Pt is encouraged to perform quad sets and ankle pumps with RLE to gain further muscle activation after surgery. Patient will benefit from continued inpatient follow up therapy, <3 hours/day.        If plan is discharge home, recommend the following: A lot of help with walking and/or transfers;A lot of help with bathing/dressing/bathroom;Assistance with cooking/housework;Assist for transportation;Help with stairs or ramp for entrance   Can travel by private vehicle   No    Equipment Recommendations Wheelchair (measurements PT);Wheelchair cushion (measurements PT)  Recommendations for Other Services       Functional Status Assessment Patient has had a recent decline in their functional status and demonstrates the ability to make significant improvements in function in a reasonable and predictable amount of time.     Precautions / Restrictions Precautions Precautions: Fall Recall of Precautions/Restrictions: Intact Restrictions Weight Bearing Restrictions Per Provider Order: Yes RLE Weight Bearing Per Provider Order: Weight bearing as tolerated      Mobility  Bed Mobility Overal bed mobility: Needs Assistance Bed Mobility: Supine to Sit     Supine to sit: Mod assist, +2 for physical assistance          Transfers Overall transfer level: Needs assistance Equipment used: Rolling walker (2  wheels) Transfers: Sit to/from Stand, Bed to chair/wheelchair/BSC Sit to Stand: Min assist, Mod assist, +2 physical assistance   Step pivot transfers: Min assist, Mod assist, +2 physical assistance       General transfer comment: minA to rise form BSC ans step-pivot 2nd attempt    Ambulation/Gait                  Stairs            Wheelchair Mobility     Tilt Bed    Modified Rankin (Stroke Patients Only)       Balance Overall balance assessment: Needs assistance Sitting-balance support: No upper extremity supported, Feet supported Sitting balance-Leahy Scale: Good     Standing balance support: Bilateral upper extremity supported, Reliant on assistive device for balance Standing balance-Leahy Scale: Poor                               Pertinent Vitals/Pain Pain Assessment Pain Assessment: Faces Faces Pain Scale: Hurts even more Pain Location: RLE Pain Descriptors / Indicators: Sore Pain Intervention(s): Monitored during session    Home Living Family/patient expects to be discharged to:: Private residence Living Arrangements: Alone Available Help at Discharge: Family;Available PRN/intermittently Type of Home: House Home Access: Level entry       Home Layout: One level Home Equipment: Shower seat - built in;Grab bars - tub/shower;BSC/3in1;Rolling Environmental consultant (2 wheels)      Prior Function Prior Level of Function : Independent/Modified Independent             Mobility Comments: RW sometimes ADLs Comments: ind  Extremity/Trunk Assessment   Upper Extremity Assessment Upper Extremity Assessment: Defer to OT evaluation    Lower Extremity Assessment Lower Extremity Assessment: Generalized weakness;RLE deficits/detail RLE Deficits / Details: pt with flicker of ankle PF/DF along with quad extension noted in supine, active range improves instanding with functional activity although still with significant weakness    Cervical /  Trunk Assessment Cervical / Trunk Assessment: Kyphotic  Communication   Communication Communication: No apparent difficulties    Cognition Arousal: Alert Behavior During Therapy: WFL for tasks assessed/performed   PT - Cognitive impairments: Memory                         Following commands: Intact       Cueing Cueing Techniques: Verbal cues     General Comments General comments (skin integrity, edema, etc.): VSS on RA    Exercises Other Exercises Other Exercises: PT provides education on ankle pumps and quad sets for RLE   Assessment/Plan    PT Assessment Patient needs continued PT services  PT Problem List Decreased strength;Decreased activity tolerance;Decreased range of motion;Decreased balance;Decreased mobility;Decreased knowledge of use of DME;Pain       PT Treatment Interventions DME instruction;Gait training;Functional mobility training;Stair training;Therapeutic activities;Balance training;Therapeutic exercise;Neuromuscular re-education;Patient/family education    PT Goals (Current goals can be found in the Care Plan section)  Acute Rehab PT Goals Patient Stated Goal: to return to independence PT Goal Formulation: With patient Time For Goal Achievement: 04/15/24 Potential to Achieve Goals: Fair    Frequency Min 2X/week     Co-evaluation PT/OT/SLP Co-Evaluation/Treatment: Yes Reason for Co-Treatment: Complexity of the patient's impairments (multi-system involvement);Necessary to address cognition/behavior during functional activity;To address functional/ADL transfers PT goals addressed during session: Mobility/safety with mobility;Balance;Proper use of DME OT goals addressed during session: ADL's and self-care       AM-PAC PT 6 Clicks Mobility  Outcome Measure Help needed turning from your back to your side while in a flat bed without using bedrails?: A Lot Help needed moving from lying on your back to sitting on the side of a flat bed  without using bedrails?: A Lot Help needed moving to and from a bed to a chair (including a wheelchair)?: A Little Help needed standing up from a chair using your arms (e.g., wheelchair or bedside chair)?: A Little Help needed to walk in hospital room?: Total Help needed climbing 3-5 steps with a railing? : Total 6 Click Score: 12    End of Session Equipment Utilized During Treatment: Gait belt Activity Tolerance: Patient tolerated treatment well Patient left: in chair;with call bell/phone within reach;with chair alarm set Nurse Communication: Mobility status PT Visit Diagnosis: Other abnormalities of gait and mobility (R26.89);Muscle weakness (generalized) (M62.81)    Time: 9050-8984 PT Time Calculation (min) (ACUTE ONLY): 26 min   Charges:   PT Evaluation $PT Eval Low Complexity: 1 Low   PT General Charges $$ ACUTE PT VISIT: 1 Visit         Bernardino JINNY Ruth, PT, DPT Acute Rehabilitation Office [phone]   Bernardino JINNY Ruth 04/01/2024, 12:34 PM

## 2024-04-01 NOTE — Plan of Care (Signed)

## 2024-04-02 DIAGNOSIS — D649 Anemia, unspecified: Secondary | ICD-10-CM | POA: Diagnosis not present

## 2024-04-02 DIAGNOSIS — N179 Acute kidney failure, unspecified: Secondary | ICD-10-CM | POA: Diagnosis not present

## 2024-04-02 DIAGNOSIS — E1159 Type 2 diabetes mellitus with other circulatory complications: Secondary | ICD-10-CM | POA: Diagnosis not present

## 2024-04-02 DIAGNOSIS — S72001A Fracture of unspecified part of neck of right femur, initial encounter for closed fracture: Secondary | ICD-10-CM | POA: Diagnosis not present

## 2024-04-02 LAB — GLUCOSE, CAPILLARY
Glucose-Capillary: 206 mg/dL — ABNORMAL HIGH (ref 70–99)
Glucose-Capillary: 206 mg/dL — ABNORMAL HIGH (ref 70–99)
Glucose-Capillary: 240 mg/dL — ABNORMAL HIGH (ref 70–99)
Glucose-Capillary: 242 mg/dL — ABNORMAL HIGH (ref 70–99)
Glucose-Capillary: 284 mg/dL — ABNORMAL HIGH (ref 70–99)
Glucose-Capillary: 52 mg/dL — ABNORMAL LOW (ref 70–99)
Glucose-Capillary: 60 mg/dL — ABNORMAL LOW (ref 70–99)
Glucose-Capillary: 93 mg/dL (ref 70–99)

## 2024-04-02 LAB — BASIC METABOLIC PANEL WITH GFR
Anion gap: 12 (ref 5–15)
BUN: 18 mg/dL (ref 8–23)
CO2: 23 mmol/L (ref 22–32)
Calcium: 8.3 mg/dL — ABNORMAL LOW (ref 8.9–10.3)
Chloride: 97 mmol/L — ABNORMAL LOW (ref 98–111)
Creatinine, Ser: 1.06 mg/dL — ABNORMAL HIGH (ref 0.44–1.00)
GFR, Estimated: 51 mL/min — ABNORMAL LOW (ref >60)
Glucose, Bld: 218 mg/dL — ABNORMAL HIGH (ref 70–99)
Potassium: 4.1 mmol/L (ref 3.5–5.1)
Sodium: 132 mmol/L — ABNORMAL LOW (ref 135–145)

## 2024-04-02 LAB — CBC
HCT: 21.4 % — ABNORMAL LOW (ref 36.0–46.0)
Hemoglobin: 7.1 g/dL — ABNORMAL LOW (ref 12.0–15.0)
MCH: 30.5 pg (ref 26.0–34.0)
MCHC: 33.2 g/dL (ref 30.0–36.0)
MCV: 91.8 fL (ref 80.0–100.0)
Platelets: 191 10*3/uL (ref 150–400)
RBC: 2.33 MIL/uL — ABNORMAL LOW (ref 3.87–5.11)
RDW: 13.8 % (ref 11.5–15.5)
WBC: 11.6 10*3/uL — ABNORMAL HIGH (ref 4.0–10.5)
nRBC: 0 % (ref 0.0–0.2)

## 2024-04-02 LAB — MAGNESIUM: Magnesium: 2.1 mg/dL (ref 1.7–2.4)

## 2024-04-02 MED ORDER — ASPIRIN 81 MG PO TABS: 81.0000 mg | ORAL_TABLET | Freq: Every day | ORAL | Status: DC

## 2024-04-02 MED ORDER — LOSARTAN POTASSIUM 50 MG PO TABS
50.0000 mg | ORAL_TABLET | Freq: Every day | ORAL | Status: DC
  Administered 2024-04-02 – 2024-04-07 (×6): 50 mg via ORAL
  Filled 2024-04-02 (×6): qty 1

## 2024-04-02 MED ORDER — ASPIRIN 81 MG PO TBEC
81.0000 mg | DELAYED_RELEASE_TABLET | Freq: Every day | ORAL | Status: DC
  Administered 2024-04-02 – 2024-04-06 (×5): 81 mg via ORAL
  Filled 2024-04-02 (×5): qty 1

## 2024-04-02 NOTE — NC FL2 (Signed)
 [place]  MEDICAID FL2 LEVEL OF CARE FORM     IDENTIFICATION  Patient Name: Jennifer Bailey Birthdate: Sep 12, 1935 Sex: female Admission Date (Current Location): 03/30/2024  [facility] and [place] Number:  Producer, television/film/video and Address:  [facility], [address] [place], [place] [address]      Provider Number: [id number]  Attending Physician Name and Address:  Arlon Carliss ORN, DO  Relative Name and Phone Number:  Obadiah Hoover (Daughter)  [phone]    Current Level of Care: Hospital Recommended Level of Care: Skilled Nursing Facility Prior Approval Number:    Date Approved/Denied:   PASRR Number: [id number]  Discharge Plan: SNF    Current Diagnoses: Patient Active Problem List   Diagnosis Date Noted   Hip fracture (HCC) 03/30/2024   ARF (acute renal failure) (HCC) 03/30/2024   Primary insomnia 02/07/2024   Paronychia of great toe of left foot 01/05/2024   Abrasion of anterior lower leg, initial encounter 10/15/2023   Seborrheic keratoses 10/15/2023   Calcaneal spur of right foot 10/15/2023   Depression, major, single episode, mild (HCC) 01/02/2023   Unsteady gait when walking 01/02/2023   Dementia without behavioral disturbance, psychotic disturbance, mood disturbance, or anxiety (HCC) 01/02/2023   Glaucoma 12/04/2020   Anemia 12/13/2019   Widowed 07/09/2019   Acquired autoimmune hypothyroidism 08/20/2014   Osteoporosis 11/20/2013   Diabetic polyneuropathy associated with type 2 diabetes mellitus (HCC) 08/14/2013   Hypertension associated with diabetes (HCC) 06/14/2013   Hyperlipidemia associated with type 2 diabetes mellitus (HCC) 06/14/2013   Type 2 diabetes mellitus with hypoglycemia without coma, with long-term current use of insulin  (HCC) 05/29/2013    Orientation RESPIRATION BLADDER Height & Weight     Self, Place  Normal Continent Weight: 129 lb (58.5 kg) Height:  4' 10 (147.3 cm)  BEHAVIORAL SYMPTOMS/MOOD  NEUROLOGICAL BOWEL NUTRITION STATUS      Continent Diet (see DC summary)  AMBULATORY STATUS COMMUNICATION OF NEEDS Skin   Extensive Assist Verbally Other (Comment) (Pressure Injury 12/07/20 Heel Right Deep Tissue Pressure Injury -nonblanchable, dark red area to R heelWound. 03/31/24 1105 Surgical Closed Surgical Incision Hip Right)                       Personal Care Assistance Level of Assistance  Bathing, Feeding, Dressing Bathing Assistance: Maximum assistance Feeding assistance: Limited assistance Dressing Assistance: Maximum assistance     Functional Limitations Info  Sight, Hearing, Speech Sight Info: Adequate Hearing Info: Adequate Speech Info: Adequate    SPECIAL CARE FACTORS FREQUENCY  PT (By licensed PT), OT (By licensed OT)     PT Frequency: 5x/week OT Frequency: 5x/week            Contractures Contractures Info: Not present    Additional Factors Info  Code Status, Allergies Code Status Info: FULL Allergies Info: Macrodantin (Nitrofurantoin Macrocrystal)  Prednisone  Penicillins           Current Medications (04/02/2024):  This is the current hospital active medication list Current Facility-Administered Medications  Medication Dose Route Frequency Provider Last Rate Last Admin   (feeding supplement) PROSource Plus liquid 30 mL  30 mL Oral BID BM Arlon Carliss W, DO   30 mL at 04/01/24 1333   brimonidine  (ALPHAGAN ) 0.2 % ophthalmic solution 1 drop  1 drop Left Eye BID Haddix, Franky SQUIBB, MD   1 drop at 04/02/24 0855   cholecalciferol (VITAMIN D3) 25 MCG (1000 UNIT) tablet 1,000 Units  1,000 Units  Oral Daily Arlon Carliss ORN, DO   1,000 Units at 04/02/24 9146   dorzolamide -timolol  (COSOPT ) 2-0.5 % ophthalmic solution 1 drop  1 drop Left Eye BID Haddix, Franky SQUIBB, MD   1 drop at 04/02/24 0854   enoxaparin  (LOVENOX ) injection 30 mg  30 mg Subcutaneous Q24H Pham, Minh Q, RPH-CPP   30 mg at 04/02/24 0853   feeding supplement (GLUCERNA SHAKE) (GLUCERNA SHAKE)  liquid 237 mL  237 mL Oral TID BM Arlon Carliss ORN, DO   237 mL at 04/01/24 2054   hydrALAZINE  (APRESOLINE ) injection 5 mg  5 mg Intravenous Q4H PRN Haddix, Kevin P, MD       HYDROcodone -acetaminophen  (NORCO/VICODIN) 5-325 MG per tablet 1-2 tablet  1-2 tablet Oral Q4H PRN Haddix, Kevin P, MD   1 tablet at 04/02/24 0853   insulin  aspart (novoLOG ) injection 0-9 Units  0-9 Units Subcutaneous Q4H Haddix, Franky SQUIBB, MD   3 Units at 04/02/24 0854   insulin  glargine-yfgn (SEMGLEE ) injection 10 Units  10 Units Subcutaneous QHS Haddix, Kevin P, MD   10 Units at 04/01/24 2142   levothyroxine  (SYNTHROID ) tablet 100 mcg  100 mcg Oral Q0600 Haddix, Kevin P, MD   100 mcg at 04/02/24 0542   morphine  (PF) 2 MG/ML injection 0.5 mg  0.5 mg Intravenous Q3H PRN Haddix, Kevin P, MD       multivitamin with minerals tablet 1 tablet  1 tablet Oral Daily Arlon Carliss ORN, DO   1 tablet at 04/02/24 9146   ondansetron  (ZOFRAN ) tablet 4 mg  4 mg Oral Q8H PRN Haddix, Kevin P, MD   4 mg at 03/31/24 2025   pravastatin  (PRAVACHOL ) tablet 40 mg  40 mg Oral q1800 Haddix, Kevin P, MD   40 mg at 04/01/24 1802   traZODone  (DESYREL ) tablet 25 mg  25 mg Oral QHS PRN Haddix, Kevin P, MD   25 mg at 04/02/24 9957     Discharge Medications: Please see discharge summary for a list of discharge medications.  Relevant Imaging Results:  Relevant Lab Results:   Additional Information SSN: [id number]  Angelisse Riso A [place], LCSW

## 2024-04-02 NOTE — Progress Notes (Signed)
 Subjective: Patient resting comfortably.  Tolerating diet.  Urinating.   No CP, SOB.  Has mobilized some OOB with PT/OT.  Objective:   VITALS:   Vitals:   04/01/24 1614 04/01/24 2021 04/02/24 0414 04/02/24 0722  BP: (!) 148/42 (!) 144/48 (!) 142/52 (!) 145/43  Pulse: 75 80 80 84  Resp: 16 17 20 16   Temp: 98.3 F (36.8 C) 98.1 F (36.7 C) 97.7 F (36.5 C) 98.4 F (36.9 C)  TempSrc: Oral Oral Oral Oral  SpO2: 93% 98% 99% 98%  Weight:      Height:          Latest Ref Rng & Units 04/01/2024    8:24 AM 03/31/2024    5:00 AM 03/30/2024    5:53 PM  CBC  WBC 4.0 - 10.5 K/uL 11.9  11.3  14.8   Hemoglobin 12.0 - 15.0 g/dL 7.5  89.5  89.0   Hematocrit 36.0 - 46.0 % 22.3  30.5  31.5   Platelets 150 - 400 K/uL 209  240  266       Latest Ref Rng & Units 04/01/2024    8:24 AM 03/31/2024    5:00 AM 03/30/2024    5:53 PM  BMP  Glucose 70 - 99 mg/dL 817  809  742   BUN 8 - 23 mg/dL 13  16  16    Creatinine 0.44 - 1.00 mg/dL 8.67  9.01  8.94   Sodium 135 - 145 mmol/L 136  133  132   Potassium 3.5 - 5.1 mmol/L 4.2  3.7  3.8   Chloride 98 - 111 mmol/L 102  99  99   CO2 22 - 32 mmol/L 22  22  22    Calcium 8.9 - 10.3 mg/dL 8.8  8.9  9.2    Intake/Output      06/28 0701 06/29 0700 06/29 0701 06/30 0700   P.O. 720    I.V. (mL/kg)     IV Piggyback 207.2    Total Intake(mL/kg) 927.2 (15.8)    Urine (mL/kg/hr)     Total Output     Net +927.2         Urine Occurrence 1 x       Physical Exam: General: NAD.  Sleeping soundly in bedside chair, calm, comfortable Resp: No increased wob Cardio: regular rate and rhythm ABD soft Neurologically intact MSK Neurovascularly intact Sensation intact distally Intact pulses distally Dorsiflexion/Plantar flexion intact Incision: dressing C/D/I   Assessment: 2 Days Post-Op  S/P Procedure(s) (LRB): FIXATION, FRACTURE, INTERTROCHANTERIC, WITH INTRAMEDULLARY ROD (Right) by Dr. Kendal on 03/30/24  Principal Problem:   Hip fracture  (HCC) Active Problems:   Type 2 diabetes mellitus with hypoglycemia without coma, with long-term current use of insulin  (HCC)   Hypertension associated with diabetes (HCC)   Hyperlipidemia associated with type 2 diabetes mellitus (HCC)   Osteoporosis   Acquired autoimmune hypothyroidism   Anemia   Depression, major, single episode, mild (HCC)   ARF (acute renal failure) (HCC)   Plan:  Advance diet Up with therapy Incentive Spirometry Elevate and Apply ice  Weightbearing: WBAT RLE Insicional and dressing care: Dressings left intact until follow-up and Reinforce dressings as needed Orthopedic device(s): None Showering: Keep dressing dry VTE prophylaxis: Lovenox  40mg  qd while inpatient, SCDs, ambulation Pain control: PRN, limit narcotics as able due to age Follow - up plan: 2 weeks Contact information for today:  Evalene Chancy MD, Gerard Large PA-C  Dispo: PT/OT recommending SNF.  Gerard CHRISTELLA Large, PA-C Office [phone] 04/02/2024, 8:06 AM

## 2024-04-02 NOTE — Progress Notes (Signed)
 Progress Note   Patient: Jennifer Bailey FMW:[id number] DOB: [birth year] DOA: 03/30/2024  DOS: the patient was seen and examined on 04/02/2024   Brief hospital course:  89 y.o. female with history of diabetes melitis type II, hypertension, hypothyroidism, hyperlipidemia [place].  Denies hitting head or losing consciousness has been having right hip pain after the fall.    Assessment and Plan:   Acute right intertrochanteric fracture - Secondary to ground-level mechanical fall.  Orthopedic surgery consulted and following closely.  Status post intramedullary nail.  Weightbearing as tolerated RLE.  Continues on Lovenox  for DVT prophylaxis.  Can follow-up with orthopedic surgery Dr. Kendal in the outpatient setting in 2 weeks.  Acute kidney injury - Initial presentation likely prerenal etiology given dehydration.  Holding diuretics/ARB.  IV fluid hydration on board.  Currently pending this morning.  Will monitor urine output recheck BMP and magnesium in AM.   Mild hyponatremia - Likely secondary to above.  IV fluid hydration on board.  Showing improvement.  Will recheck BMP and magnesium in AM.   Insulin -dependent diabetes mellitus - Tenderness insulin  glargine with insulin  sliding scale on board.  [name] 8.9 suggesting poor control but improving..  Last known [name] greater than 10 approximately 4 months ago.   Hypertension - Restart losartan  home dose today.   Chronic normocytic anemia - Will recheck CBC in a.m.    Hypothyroidism - Continue Synthroid .     Physical debilitation muscle weakness - PT/OT on board.  Likely transition to SNF.   Subjective: Patient resting comfortably this morning.  Denied any fever, chills, chest pain, nausea, vomiting, abdominal pain.  Does have pain in her right hip but otherwise well-controlled.  Working with PT/OT.  Physical Exam:  Vitals:   04/01/24 1614 04/01/24 2021 04/02/24 0414 04/02/24 0722   BP: (!) 148/42 (!) 144/48 (!) 142/52 (!) 145/43  Pulse: 75 80 80 84  Resp: 16 17 20 16   Temp: 98.3 F (36.8 C) 98.1 F (36.7 C) 97.7 F (36.5 C) 98.4 F (36.9 C)  TempSrc: Oral Oral Oral Oral  SpO2: 93% 98% 99% 98%  Weight:      Height:        GENERAL:  Alert, pleasant, no acute distress, frail HEENT:  EOMI CARDIOVASCULAR:  RRR, no murmurs appreciated RESPIRATORY:  Clear to auscultation, no wheezing, rales, or rhonchi GASTROINTESTINAL:  Soft, nontender, nondistended EXTREMITIES: RLE shortened and externally rotated NEURO:  No new focal deficits appreciated SKIN:  No rashes noted PSYCH:  Appropriate mood and affect    Data Reviewed:  No new imaging to review at this time  Previous records (including but not limited to H&P, progress notes, nursing notes, TOC management) were reviewed in assessment of this patient.  Labs: CBC: Recent Labs  Lab 03/30/24 1753 03/31/24 0500 04/01/24 0824  WBC 14.8* 11.3* 11.9*  NEUTROABS 12.0*  --   --   HGB 10.9* 10.4* 7.5*  HCT 31.5* 30.5* 22.3*  MCV 88.5 90.8 89.6  PLT 266 240 209   Basic Metabolic Panel: Recent Labs  Lab 03/30/24 1753 03/31/24 0500 04/01/24 0824  NA 132* 133* 136  K 3.8 3.7 4.2  CL 99 99 102  CO2 22 22 22   GLUCOSE 257* 190* 182*  BUN 16 16 13   CREATININE 1.05* 0.98 1.32*  CALCIUM 9.2 8.9 8.8*  MG  --   --  1.9   Liver Function Tests: No results for input(s): AST, ALT,  ALKPHOS, BILITOT, PROT, ALBUMIN in the last 168 hours. CBG: Recent Labs  Lab 04/02/24 0359 04/02/24 0405 04/02/24 0436 04/02/24 0751 04/02/24 1123  GLUCAP 60* 52* 93 240* 206*    Scheduled Meds:  (feeding supplement) PROSource Plus  30 mL Oral BID BM   brimonidine   1 drop Left Eye BID   cholecalciferol  1,000 Units Oral Daily   dorzolamide -timolol   1 drop Left Eye BID   enoxaparin  (LOVENOX ) injection  30 mg Subcutaneous Q24H   feeding supplement (GLUCERNA SHAKE)  237 mL Oral TID BM   insulin  aspart  0-9 Units  Subcutaneous Q4H   insulin  glargine-yfgn  10 Units Subcutaneous QHS   levothyroxine   100 mcg Oral Q0600   multivitamin with minerals  1 tablet Oral Daily   pravastatin   40 mg Oral q1800   Continuous Infusions:   PRN Meds:.hydrALAZINE , HYDROcodone -acetaminophen , morphine  injection, ondansetron , traZODone   Family Communication: Daughter via telephone  Disposition: Status is: Inpatient Remains inpatient appropriate because: Right hip fracture     Time spent: 33 minutes  Length of inpatient stay: 3 days  Author: Carliss LELON Canales, DO 04/02/2024 11:28 AM  For on call review [email or web address].

## 2024-04-02 NOTE — Plan of Care (Signed)

## 2024-04-02 NOTE — TOC Initial Note (Signed)
 Transition of Care [facility]) - Initial/Assessment Note    Patient Details  Name: Jennifer Bailey MRN: [id number] Date of Birth: Mar 18, 1935  [facility]) CM/SW Contact:    Alexei Ey A [place], LCSW Phone Number: 04/02/2024, 10:13 AM  Clinical Narrative:                  Update 1200 CSW was contacted back by pt's daughter [name], she was agreeable for pt to go to SNF. Preference for [facility]. Bed offers pending.   1013 CSW made attempt to contact pt's daughter [name] to complete assessment as pt is oriented x2. No answer, CSW left voicemail with contact information to reach back out to CSW. Per chart review pt lives alone, from home. CSW completed SNF workup in event family and pt agreeable to plan. SNF workup completed, bed offers pending. Awaiting final disposition plan.    TOC will continue to follow.   Expected Discharge Plan: Skilled Nursing Facility Barriers to Discharge: Continued Medical Work up, SNF Pending bed offer, Insurance Authorization   Patient Goals and CMS Choice            Expected Discharge Plan and Services       Living arrangements for the past 2 months: Single Family Home                                      Prior Living Arrangements/Services Living arrangements for the past 2 months: Single Family Home Lives with:: Self          Need for Family Participation in Patient Care: Yes (Comment) Care giver support system in place?: Yes (comment) (pt's niece, [name])      Activities of Daily Living   ADL Screening (condition at time of admission) Independently performs ADLs?: Yes (appropriate for developmental age) Is the patient deaf or have difficulty hearing?: No Does the patient have difficulty seeing, even when wearing glasses/contacts?: No Does the patient have difficulty concentrating, remembering, or making decisions?: No  Permission Sought/Granted                  Emotional Assessment Appearance:: Appears older than  stated age Attitude/Demeanor/Rapport: Unable to Assess Affect (typically observed): Unable to Assess Orientation: : Oriented to Self, Oriented to Place Alcohol  / Substance Use: Not Applicable Psych Involvement: No (comment)  Admission diagnosis:  Hip fracture (HCC) [S72.009A] Patient Active Problem List   Diagnosis Date Noted   Hip fracture (HCC) 03/30/2024   ARF (acute renal failure) (HCC) 03/30/2024   Primary insomnia 02/07/2024   Paronychia of great toe of left foot 01/05/2024   Abrasion of anterior lower leg, initial encounter 10/15/2023   Seborrheic keratoses 10/15/2023   Calcaneal spur of right foot 10/15/2023   Depression, major, single episode, mild (HCC) 01/02/2023   Unsteady gait when walking 01/02/2023   Dementia without behavioral disturbance, psychotic disturbance, mood disturbance, or anxiety (HCC) 01/02/2023   Glaucoma 12/04/2020   Anemia 12/13/2019   Widowed 07/09/2019   Acquired autoimmune hypothyroidism 08/20/2014   Osteoporosis 11/20/2013   Diabetic polyneuropathy associated with type 2 diabetes mellitus (HCC) 08/14/2013   Hypertension associated with diabetes (HCC) 06/14/2013   Hyperlipidemia associated with type 2 diabetes mellitus (HCC) 06/14/2013   Type 2 diabetes mellitus with hypoglycemia without coma, with long-term current use of insulin  (HCC) 05/29/2013   PCP:  Oris Camie BRAVO, NP Pharmacy:   [facility] [place],  [place] - [address] [place] [address] Phone: [phone] Fax: [phone]  [email or web address] [place] - [address] [place] [address] Phone: [phone] Fax: [phone]  [facility] [address] [place]), [place] - [address] [place] [address] Phone: [phone] Fax: [phone]     Social Drivers of Health (SDOH) Social History: SDOH Screenings   Food Insecurity: No Food Insecurity (03/31/2024)  Housing:  Low Risk  (03/31/2024)  Transportation Needs: No Transportation Needs (03/31/2024)  Utilities: Not At Risk (03/31/2024)  Depression (PHQ2-9): Low Risk  (02/07/2024)  Financial Resource Strain: Low Risk  (02/16/2023)  Physical Activity: Sufficiently Active (02/16/2023)  Social Connections: Moderately Isolated (03/31/2024)  Stress: No Stress Concern Present (02/16/2023)  Tobacco Use: Low Risk  (03/31/2024)   SDOH Interventions:     Readmission Risk Interventions     No data to display

## 2024-04-03 ENCOUNTER — Encounter (HOSPITAL_COMMUNITY): Payer: Self-pay | Admitting: Student

## 2024-04-03 DIAGNOSIS — E11649 Type 2 diabetes mellitus with hypoglycemia without coma: Secondary | ICD-10-CM | POA: Diagnosis not present

## 2024-04-03 DIAGNOSIS — S72001A Fracture of unspecified part of neck of right femur, initial encounter for closed fracture: Secondary | ICD-10-CM | POA: Diagnosis not present

## 2024-04-03 DIAGNOSIS — E1159 Type 2 diabetes mellitus with other circulatory complications: Secondary | ICD-10-CM | POA: Diagnosis not present

## 2024-04-03 DIAGNOSIS — D649 Anemia, unspecified: Secondary | ICD-10-CM | POA: Diagnosis not present

## 2024-04-03 LAB — CBC
HCT: 22.4 % — ABNORMAL LOW (ref 36.0–46.0)
Hemoglobin: 7.4 g/dL — ABNORMAL LOW (ref 12.0–15.0)
MCH: 30.1 pg (ref 26.0–34.0)
MCHC: 33 g/dL (ref 30.0–36.0)
MCV: 91.1 fL (ref 80.0–100.0)
Platelets: 274 10*3/uL (ref 150–400)
RBC: 2.46 MIL/uL — ABNORMAL LOW (ref 3.87–5.11)
RDW: 13.6 % (ref 11.5–15.5)
WBC: 12.6 10*3/uL — ABNORMAL HIGH (ref 4.0–10.5)
nRBC: 0 % (ref 0.0–0.2)

## 2024-04-03 LAB — BASIC METABOLIC PANEL WITH GFR
Anion gap: 10 (ref 5–15)
BUN: 16 mg/dL (ref 8–23)
CO2: 23 mmol/L (ref 22–32)
Calcium: 8.4 mg/dL — ABNORMAL LOW (ref 8.9–10.3)
Chloride: 100 mmol/L (ref 98–111)
Creatinine, Ser: 1.02 mg/dL — ABNORMAL HIGH (ref 0.44–1.00)
GFR, Estimated: 54 mL/min — ABNORMAL LOW (ref >60)
Glucose, Bld: 138 mg/dL — ABNORMAL HIGH (ref 70–99)
Potassium: 3.7 mmol/L (ref 3.5–5.1)
Sodium: 133 mmol/L — ABNORMAL LOW (ref 135–145)

## 2024-04-03 LAB — GLUCOSE, CAPILLARY
Glucose-Capillary: 208 mg/dL — ABNORMAL HIGH (ref 70–99)
Glucose-Capillary: 150 mg/dL — ABNORMAL HIGH (ref 70–99)
Glucose-Capillary: 245 mg/dL — ABNORMAL HIGH (ref 70–99)
Glucose-Capillary: 275 mg/dL — ABNORMAL HIGH (ref 70–99)
Glucose-Capillary: 221 mg/dL — ABNORMAL HIGH (ref 70–99)

## 2024-04-03 MED ORDER — VITAMIN D3 25 MCG PO TABS: 1000.0000 [IU] | ORAL_TABLET | Freq: Every day | ORAL | 2 refills | Status: DC

## 2024-04-03 MED ORDER — APIXABAN 2.5 MG PO TABS: 2.5000 mg | ORAL_TABLET | Freq: Two times a day (BID) | ORAL | 0 refills | Status: DC

## 2024-04-03 MED ORDER — HYDROCODONE-ACETAMINOPHEN 5-325 MG PO TABS: 1.0000 | ORAL_TABLET | ORAL | 0 refills | Status: DC | PRN

## 2024-04-03 MED ORDER — HALOPERIDOL LACTATE 5 MG/ML IJ SOLN
2.0000 mg | Freq: Once | INTRAMUSCULAR | Status: AC
  Administered 2024-04-03: 2 mg via INTRAVENOUS
  Filled 2024-04-03: qty 1

## 2024-04-03 MED ORDER — ONDANSETRON HCL 4 MG/2ML IJ SOLN
4.0000 mg | Freq: Four times a day (QID) | INTRAMUSCULAR | Status: DC | PRN
  Administered 2024-04-05 – 2024-04-08 (×2): 4 mg via INTRAVENOUS
  Filled 2024-04-03 (×3): qty 2

## 2024-04-03 MED ORDER — HYDROCODONE-ACETAMINOPHEN 5-325 MG PO TABS
1.0000 | ORAL_TABLET | ORAL | Status: DC | PRN
  Administered 2024-04-04 – 2024-04-06 (×4): 1 via ORAL
  Filled 2024-04-03 (×4): qty 1

## 2024-04-03 NOTE — Progress Notes (Signed)
 Orthopaedic Trauma Progress Note  SUBJECTIVE: Mobilized with PT earlier, limited by lethargy. Up in bedside chair now. No chest pain. No SOB. No nausea/vomiting. No other complaints. No family at bedside currently  OBJECTIVE:  Vitals:   04/03/24 0514 04/03/24 0831  BP: (!) 156/59 (!) 118/36  Pulse: 96 84  Resp: 18 17  Temp: 98.4 F (36.9 C) 98.2 F (36.8 C)  SpO2: 98% 96%    Opiates Today (MME): Today's  total administered Morphine  Milligram Equivalents: 13 Opiates Yesterday (MME): Yesterday's total administered Morphine  Milligram Equivalents: 10  General: Sitting up in bedside chair Respiratory: No increased work of breathing.  Operative Extremity (RLE): Dressing CDI. Sore over the hip as expected. Motor/sensory function intact distally.   IMAGING: Stable post op imaging.   LABS:  Results for orders placed or performed during the hospital encounter of 03/30/24 (from the past 24 hours)  Glucose, capillary     Status: Abnormal   Collection Time: 04/02/24  4:29 PM  Result Value Ref Range   Glucose-Capillary 242 (H) 70 - 99 mg/dL  Glucose, capillary     Status: Abnormal   Collection Time: 04/02/24  8:33 PM  Result Value Ref Range   Glucose-Capillary 206 (H) 70 - 99 mg/dL  Glucose, capillary     Status: Abnormal   Collection Time: 04/02/24 11:47 PM  Result Value Ref Range   Glucose-Capillary 284 (H) 70 - 99 mg/dL  Glucose, capillary     Status: Abnormal   Collection Time: 04/03/24  4:07 AM  Result Value Ref Range   Glucose-Capillary 208 (H) 70 - 99 mg/dL  CBC     Status: Abnormal   Collection Time: 04/03/24  7:23 AM  Result Value Ref Range   WBC 12.6 (H) 4.0 - 10.5 K/uL   RBC 2.46 (L) 3.87 - 5.11 MIL/uL   Hemoglobin 7.4 (L) 12.0 - 15.0 g/dL   HCT 77.5 (L) 63.9 - 53.9 %   MCV 91.1 80.0 - 100.0 fL   MCH 30.1 26.0 - 34.0 pg   MCHC 33.0 30.0 - 36.0 g/dL   RDW 86.3 88.4 - 84.4 %   Platelets 274 150 - 400 K/uL   nRBC 0.0 0.0 - 0.2 %  Basic metabolic panel with GFR      Status: Abnormal   Collection Time: 04/03/24  7:23 AM  Result Value Ref Range   Sodium 133 (L) 135 - 145 mmol/L   Potassium 3.7 3.5 - 5.1 mmol/L   Chloride 100 98 - 111 mmol/L   CO2 23 22 - 32 mmol/L   Glucose, Bld 138 (H) 70 - 99 mg/dL   BUN 16 8 - 23 mg/dL   Creatinine, Ser 8.97 (H) 0.44 - 1.00 mg/dL   Calcium 8.4 (L) 8.9 - 10.3 mg/dL   GFR, Estimated 54 (L) >60 mL/min   Anion gap 10 5 - 15  Glucose, capillary     Status: Abnormal   Collection Time: 04/03/24  8:28 AM  Result Value Ref Range   Glucose-Capillary 150 (H) 70 - 99 mg/dL  Glucose, capillary     Status: Abnormal   Collection Time: 04/03/24 12:06 PM  Result Value Ref Range   Glucose-Capillary 245 (H) 70 - 99 mg/dL    ASSESSMENT: Jennifer Bailey is a 89 y.o. female, 3 Days Post-Op s/p ground level fall Procedures: INTRAMEDULLARY NAIL RIGHT INTERTROCHANTERIC FEMUR FRACTURE  CV/Blood loss: Acute blood loss anemia, Hgb 7.4 this AM. Hemodynamically stable  PLAN: Weightbearing: WBAT RLE ROM:  unrestricted  ROM  Incisional and dressing care: ok to remove dressings and leave incisions open to air Showering:  ok for incisions to get wet Orthopedic device(s): None  Pain management: Continue multimodal regimen VTE prophylaxis: Lovenox , SCDs ID:  Ancef  2gm post op completed Foley/Lines:  No foley, KVO IVFs Dispo: PT/OT ongoing, recommending SNF. TOC following for placement. Okay for discharge from ortho standpoint once cleared by medicine team and therapies D/C recommendations: - Norco, Tylenol  for pain control - Eliquis 2.5 mg BID x 30 days for DVT prophylaxis - Continue 1000 units Vit D supplementation daily  Follow - up plan: 2 weeks after d/c for wound check and repeat x-rays   Contact information:  Franky Light MD, Lauraine Moores PA-C. After hours and holidays please check [email or web address] for group call information for [facility]   Lauraine PATRIC Moores, PA-C [phone] (office) [email or web address]

## 2024-04-03 NOTE — Progress Notes (Signed)
 Progress Note   Patient: Jennifer Bailey FMW:[id number] DOB: [birth year] DOA: 03/30/2024  DOS: the patient was seen and examined on 04/03/2024   Brief hospital course:  89 y.o. female with history of diabetes melitis type II, hypertension, hypothyroidism, hyperlipidemia [place].  Denies hitting head or losing consciousness has been having right hip pain after the fall.    Assessment and Plan:   Acute right intertrochanteric fracture - Secondary to ground-level mechanical fall.  Orthopedic surgery consulted and following closely.  Status post intramedullary nail.  Weightbearing as tolerated RLE.  Continues on Lovenox  for DVT prophylaxis.  Plan to transition to p.o. Eliquis 2.5 mg twice daily for 30 days.  Vitamin D  supplementation.  Norco 5 mg as needed.  Can follow-up with orthopedic surgery Dr. Kendal in the outpatient setting in 2 weeks.   Acute kidney injury - Initial presentation likely prerenal etiology given dehydration.  Holding diuretics/ARB.  IV fluid hydration on board.  Continues to show improvement.  Will monitor urine output recheck BMP and magnesium in AM.   Mild hyponatremia - Likely secondary to above.  IV fluid hydration on board.  Showing improvement.  Will recheck BMP and magnesium in AM.   Insulin -dependent diabetes mellitus - Tenderness insulin  glargine with insulin  sliding scale on board.  [name] 8.9 suggesting poor control but improving.  Last known [name] greater than 10 approximately 4 months ago.   Hypertension - Restart losartan  home dose today.   Chronic normocytic anemia - Appears stable, mildly improved from yesterday.  hemoglobin 7.4 this morning.  Will recheck CBC in a.m. transfuse if less than 7.   Hypothyroidism - Continue Synthroid .     Physical debilitation muscle weakness - PT/OT on board.  Likely transition to SNF.   Subjective: Patient sitting in bedside chair this morning.  Apparently had woke  with severe pain requiring morphine  and p.o. opioid medications.  Subsequently became very lethargic.  Currently improvement in her pain, alert, responsive but a bit lethargic.  Denies shortness of breath, nausea, vomiting, abdominal pain.  Physical Exam:  Vitals:   04/02/24 1413 04/02/24 1920 04/03/24 0514 04/03/24 0831  BP: (!) 123/41 (!) 155/52 (!) 156/59 (!) 118/36  Pulse: 74 78 96 84  Resp: 14 18 18 17   Temp: 98.4 F (36.9 C) 98.1 F (36.7 C) 98.4 F (36.9 C) 98.2 F (36.8 C)  TempSrc: Oral Oral Oral   SpO2: 100%  98% 96%  Weight:      Height:        GENERAL:  Alert, pleasant, no acute distress, frail HEENT:  EOMI CARDIOVASCULAR:  RRR, no murmurs appreciated RESPIRATORY:  Clear to auscultation, no wheezing, rales, or rhonchi GASTROINTESTINAL:  Soft, nontender, nondistended EXTREMITIES: RLE shortened and externally rotated NEURO:  No new focal deficits appreciated SKIN: Pale, no rashes noted PSYCH:  Appropriate mood and affect    Data Reviewed:  No new imaging to review  Previous records (including but not limited to H&P, progress notes, nursing notes, TOC management) were reviewed in assessment of this patient.  Labs: CBC: Recent Labs  Lab 03/30/24 1753 03/31/24 0500 04/01/24 0824 04/02/24 1124 04/03/24 0723  WBC 14.8* 11.3* 11.9* 11.6* 12.6*  NEUTROABS 12.0*  --   --   --   --   HGB 10.9* 10.4* 7.5* 7.1* 7.4*  HCT 31.5* 30.5* 22.3* 21.4* 22.4*  MCV 88.5 90.8 89.6 91.8 91.1  PLT 266 240 209 191 274  Basic Metabolic Panel: Recent Labs  Lab 03/30/24 1753 03/31/24 0500 04/01/24 0824 04/02/24 1124 04/03/24 0723  NA 132* 133* 136 132* 133*  K 3.8 3.7 4.2 4.1 3.7  CL 99 99 102 97* 100  CO2 22 22 22 23 23   GLUCOSE 257* 190* 182* 218* 138*  BUN 16 16 13 18 16   CREATININE 1.05* 0.98 1.32* 1.06* 1.02*  CALCIUM 9.2 8.9 8.8* 8.3* 8.4*  MG  --   --  1.9 2.1  --    Liver Function Tests: No results for input(s): AST, ALT, ALKPHOS, BILITOT,  PROT, ALBUMIN in the last 168 hours. CBG: Recent Labs  Lab 04/02/24 2033 04/02/24 2347 04/03/24 0407 04/03/24 0828 04/03/24 1206  GLUCAP 206* 284* 208* 150* 245*    Scheduled Meds:  (feeding supplement) PROSource Plus  30 mL Oral BID BM   aspirin  EC  81 mg Oral Daily   brimonidine   1 drop Left Eye BID   cholecalciferol  1,000 Units Oral Daily   dorzolamide -timolol   1 drop Left Eye BID   enoxaparin  (LOVENOX ) injection  30 mg Subcutaneous Q24H   feeding supplement (GLUCERNA SHAKE)  237 mL Oral TID BM   insulin  aspart  0-9 Units Subcutaneous Q4H   insulin  glargine-yfgn  10 Units Subcutaneous QHS   levothyroxine   100 mcg Oral Q0600   losartan   50 mg Oral Daily   multivitamin with minerals  1 tablet Oral Daily   pravastatin   40 mg Oral q1800   Continuous Infusions: PRN Meds:.hydrALAZINE , HYDROcodone -acetaminophen , morphine  injection, ondansetron , traZODone   Family Communication: None at bedside  Disposition: Status is: Inpatient Remains inpatient appropriate because: Disposition, right hip fracture     Time spent: 33 minutes  Length of inpatient stay: 4 days  Author: Carliss LELON Canales, DO 04/03/2024 1:31 PM  For on call review [email or web address].

## 2024-04-03 NOTE — Plan of Care (Signed)
  Problem: Fluid Volume: Goal: Ability to maintain a balanced intake and output will improve Outcome: Progressing   Problem: Coping: Goal: Ability to adjust to condition or change in health will improve Outcome: Progressing   Problem: Health Behavior/Discharge Planning: Goal: Ability to identify and utilize available resources and services will improve Outcome: Progressing Goal: Ability to manage health-related needs will improve Outcome: Progressing   Problem: Fluid Volume: Goal: Ability to maintain a balanced intake and output will improve Outcome: Progressing   Problem: Health Behavior/Discharge Planning: Goal: Ability to identify and utilize available resources and services will improve Outcome: Progressing Goal: Ability to manage health-related needs will improve Outcome: Progressing   Problem: Education: Goal: Ability to describe self-care measures that may prevent or decrease complications (Diabetes Survival Skills Education) will improve Outcome: Progressing Goal: Individualized Educational Video(s) Outcome: Progressing
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
## 2024-04-03 NOTE — Progress Notes (Signed)
 Occupational Therapy Treatment Patient Details Name: Jennifer Bailey MRN: [id number] DOB: 04-12-1935 Today's Date: 04/03/2024   History of present illness Pt is an 89 y/o F presenting to ED on 6/26 with R hip pain after fall, found to have R intertrochanteric hip fx s/p IMN on 6/27. PMH includes DM2, hypothyroidism, HTN, HLD   OT comments  Patient with decreased LOA, lethargic, refusing transfer back to bed, stated she was too afraid of falling.  Patient not initiating movement, not helping with transfer.  Max A to near total A for simple transfers and ADL completion.  OT to continue efforts in the acute setting and Patient will benefit from continued inpatient follow up therapy, <3 hours/day.      If plan is discharge home, recommend the following:  A lot of help with walking and/or transfers;A lot of help with bathing/dressing/bathroom;Assistance with cooking/housework;Direct supervision/assist for medications management;Direct supervision/assist for financial management;Assist for transportation;Help with stairs or ramp for entrance   Equipment Recommendations       Recommendations for Other Services      Precautions / Restrictions Precautions Precautions: Fall Restrictions Weight Bearing Restrictions Per Provider Order: No       Mobility Bed Mobility Overal bed mobility: Needs Assistance Bed Mobility: Sit to Supine       Sit to supine: Max assist, Total assist        Transfers Overall transfer level: Needs assistance Equipment used: None Transfers: Bed to chair/wheelchair/BSC   Stand pivot transfers: Max assist, Total assist         General transfer comment: Face-to-face assist from therapist.     Balance Overall balance assessment: Needs assistance Sitting-balance support: Feet supported Sitting balance-Leahy Scale: Fair     Standing balance support: Bilateral upper extremity supported Standing balance-Leahy Scale: Poor                              ADL either performed or assessed with clinical judgement   ADL                   Upper Body Dressing : Maximal assistance;Sitting   Lower Body Dressing: Maximal assistance;Bed level;Total assistance   Toilet Transfer: Maximal assistance;Squat-pivot   Toileting- Clothing Manipulation and Hygiene: Total assistance              Extremity/Trunk Assessment Upper Extremity Assessment Upper Extremity Assessment: Overall WFL for tasks assessed   Lower Extremity Assessment Lower Extremity Assessment: Defer to PT evaluation        Vision   Vision Assessment?: No apparent visual deficits   Perception Perception Perception: Not tested   Praxis Praxis Praxis: Not tested   Communication Communication Communication: No apparent difficulties   Cognition Arousal: Lethargic Behavior During Therapy: Flat affect Cognition: No family/caregiver present to determine baseline                               Following commands: Impaired Following commands impaired: Follows multi-step commands inconsistently      Cueing   Cueing Techniques: Verbal cues  Exercises      Shoulder Instructions       General Comments      Pertinent Vitals/ Pain       Pain Assessment Pain Assessment: Faces Faces Pain Scale: Hurts even more Pain Location: RLE Pain Descriptors / Indicators: Guarding, Grimacing Pain Intervention(s): Monitored during session  Frequency  Min 2X/week        Progress Toward Goals  OT Goals(current goals can now be found in the care plan section)     Acute Rehab OT Goals OT Goal Formulation: With patient Time For Goal Achievement: 04/15/24 Potential to Achieve Goals: Fair  Plan      Co-evaluation                 AM-PAC OT 6 Clicks Daily Activity     Outcome Measure   Help from another person eating meals?: A Lot Help from another  person taking care of personal grooming?: A Lot Help from another person toileting, which includes using toliet, bedpan, or urinal?: A Lot Help from another person bathing (including washing, rinsing, drying)?: A Lot Help from another person to put on and taking off regular upper body clothing?: A Lot Help from another person to put on and taking off regular lower body clothing?: A Lot 6 Click Score: 12    End of Session    OT Visit Diagnosis: Unsteadiness on feet (R26.81);Other abnormalities of gait and mobility (R26.89);Muscle weakness (generalized) (M62.81)   Activity Tolerance Patient limited by lethargy   Patient Left in bed;with call bell/phone within reach;with bed alarm set   Nurse Communication Mobility status        Time: 8694-8672 OT Time Calculation (min): 22 min  Charges: OT General Charges $OT Visit: 1 Visit OT Treatments $Self Care/Home Management : 8-22 mins  04/03/2024  RP, OTR/L  Acute Rehabilitation Services  Office:  [phone]   Jennifer Bailey 04/03/2024, 1:31 PM

## 2024-04-03 NOTE — Progress Notes (Signed)
 Physical Therapy Treatment Patient Details Name: Jennifer Bailey MRN: [id number] DOB: 1934/10/08 Today's Date: 04/03/2024   History of Present Illness Pt is an 89 y/o F presenting to ED on 6/26 with R hip pain after fall, found to have R intertrochanteric hip fx s/p IMN on 6/27. PMH includes DM2, hypothyroidism, HTN, HLD    PT Comments  Pt received pain meds just prior to PT arrival. Pt very lethargic requiring continuous cues to stay awake. VSS. Pt required increased assist this session due to her lethargy. Max assist bed mobility, max assist STS, and max assist SPT. Pt in recliner with feet elevated at end of session.     If plan is discharge home, recommend the following: A lot of help with walking and/or transfers;A lot of help with bathing/dressing/bathroom;Assistance with cooking/housework;Assist for transportation;Help with stairs or ramp for entrance   Can travel by private vehicle     No  Equipment Recommendations  Wheelchair (measurements PT);Wheelchair cushion (measurements PT)    Recommendations for Other Services       Precautions / Restrictions Precautions Precautions: Fall Recall of Precautions/Restrictions: Intact Restrictions RLE Weight Bearing Per Provider Order: Weight bearing as tolerated     Mobility  Bed Mobility Overal bed mobility: Needs Assistance Bed Mobility: Supine to Sit     Supine to sit: Max assist     General bed mobility comments: use of bed pad to scoot to EOB    Transfers Overall transfer level: Needs assistance Equipment used: None Transfers: Sit to/from Stand, Bed to chair/wheelchair/BSC Sit to Stand: Max assist Stand pivot transfers: Max assist         General transfer comment: pivot transfer bed to recliner toward L. Face-to-face assist from therapist.    Ambulation/Gait                   Stairs             Wheelchair Mobility     Tilt Bed    Modified Rankin (Stroke Patients Only)        Balance Overall balance assessment: Needs assistance Sitting-balance support: Bilateral upper extremity supported, Feet supported Sitting balance-Leahy Scale: Fair     Standing balance support: Bilateral upper extremity supported Standing balance-Leahy Scale: Poor Standing balance comment: reliant on external support                            Communication Communication Communication: No apparent difficulties  Cognition Arousal: Lethargic Behavior During Therapy: Flat affect   PT - Cognitive impairments: Memory                         Following commands: Intact      Cueing Cueing Techniques: Verbal cues  Exercises      General Comments General comments (skin integrity, edema, etc.): SpO2 95% on RA. HR in 90s.      Pertinent Vitals/Pain Pain Assessment Pain Assessment: Faces Faces Pain Scale: Hurts little more Pain Location: RLE Pain Descriptors / Indicators: Guarding, Grimacing, Sore Pain Intervention(s): Premedicated before session, Repositioned, Limited activity within patient's tolerance    Home Living                          Prior Function            PT Goals (current goals can now be found in the care plan section) Acute Rehab  PT Goals Patient Stated Goal: not stated Progress towards PT goals: Progressing toward goals    Frequency    Min 2X/week      PT Plan      Co-evaluation              AM-PAC PT 6 Clicks Mobility   Outcome Measure  Help needed turning from your back to your side while in a flat bed without using bedrails?: A Lot Help needed moving from lying on your back to sitting on the side of a flat bed without using bedrails?: A Lot Help needed moving to and from a bed to a chair (including a wheelchair)?: A Lot Help needed standing up from a chair using your arms (e.g., wheelchair or bedside chair)?: A Lot Help needed to walk in hospital room?: Total Help needed climbing 3-5 steps with a  railing? : Total 6 Click Score: 10    End of Session Equipment Utilized During Treatment: Gait belt Activity Tolerance: Patient limited by lethargy (from pain meds) Patient left: in chair;with chair alarm set;with call bell/phone within reach Nurse Communication: Mobility status;Precautions PT Visit Diagnosis: Other abnormalities of gait and mobility (R26.89);Muscle weakness (generalized) (M62.81)     Time: 9161-9144 PT Time Calculation (min) (ACUTE ONLY): 17 min  Charges:    $Therapeutic Activity: 8-22 mins PT General Charges $$ ACUTE PT VISIT: 1 Visit                     Sari MATSU., PT  Office # [phone]    Erven Sari Shaker 04/03/2024, 9:15 AM

## 2024-04-03 NOTE — Care Management Important Message (Signed)
 Important Message  Patient Details  Name: Jennifer Bailey MRN: [id number] Date of Birth: Aug 26, 1935   Important Message Given:  Yes - Medicare IM     Jon Cruel 04/03/2024, 12:35 PM

## 2024-04-03 NOTE — Anesthesia Postprocedure Evaluation (Signed)
 Anesthesia Post Note  Patient: Jennifer Bailey  Procedure(s) Performed: FIXATION, FRACTURE, INTERTROCHANTERIC, WITH INTRAMEDULLARY ROD (Right)     Patient location during evaluation: PACU Anesthesia Type: General Level of consciousness: awake and alert Pain management: pain level controlled Vital Signs Assessment: post-procedure vital signs reviewed and stable Respiratory status: spontaneous breathing, nonlabored ventilation, respiratory function stable and patient connected to nasal cannula oxygen Cardiovascular status: blood pressure returned to baseline and stable Postop Assessment: no apparent nausea or vomiting Anesthetic complications: no   No notable events documented.  Last Vitals:  Vitals:   04/03/24 0514 04/03/24 0831  BP: (!) 156/59 (!) 118/36  Pulse: 96 84  Resp: 18 17  Temp: 36.9 C 36.8 C  SpO2: 98% 96%    Last Pain:  Vitals:   04/03/24 0911  TempSrc:   PainSc: 0-No pain                 Epifanio Lamar BRAVO

## 2024-04-04 ENCOUNTER — Telehealth (HOSPITAL_COMMUNITY): Payer: Self-pay | Admitting: Pharmacy Technician

## 2024-04-04 ENCOUNTER — Other Ambulatory Visit (HOSPITAL_COMMUNITY): Payer: Self-pay

## 2024-04-04 DIAGNOSIS — E11649 Type 2 diabetes mellitus with hypoglycemia without coma: Secondary | ICD-10-CM | POA: Diagnosis not present

## 2024-04-04 DIAGNOSIS — N179 Acute kidney failure, unspecified: Secondary | ICD-10-CM | POA: Diagnosis not present

## 2024-04-04 DIAGNOSIS — S72001A Fracture of unspecified part of neck of right femur, initial encounter for closed fracture: Secondary | ICD-10-CM | POA: Diagnosis not present

## 2024-04-04 DIAGNOSIS — E1159 Type 2 diabetes mellitus with other circulatory complications: Secondary | ICD-10-CM | POA: Diagnosis not present

## 2024-04-04 LAB — GLUCOSE, CAPILLARY
Glucose-Capillary: 272 mg/dL — ABNORMAL HIGH (ref 70–99)
Glucose-Capillary: 404 mg/dL — ABNORMAL HIGH (ref 70–99)
Glucose-Capillary: 281 mg/dL — ABNORMAL HIGH (ref 70–99)
Glucose-Capillary: 211 mg/dL — ABNORMAL HIGH (ref 70–99)

## 2024-04-04 LAB — CBC
HCT: 21.2 % — ABNORMAL LOW (ref 36.0–46.0)
Hemoglobin: 7.1 g/dL — ABNORMAL LOW (ref 12.0–15.0)
MCH: 30.6 pg (ref 26.0–34.0)
MCHC: 33.5 g/dL (ref 30.0–36.0)
MCV: 91.4 fL (ref 80.0–100.0)
Platelets: 265 10*3/uL (ref 150–400)
RBC: 2.32 MIL/uL — ABNORMAL LOW (ref 3.87–5.11)
RDW: 13.8 % (ref 11.5–15.5)
WBC: 10.7 10*3/uL — ABNORMAL HIGH (ref 4.0–10.5)
nRBC: 0.2 % (ref 0.0–0.2)

## 2024-04-04 LAB — BASIC METABOLIC PANEL WITH GFR
Anion gap: 9 (ref 5–15)
BUN: 15 mg/dL (ref 8–23)
CO2: 24 mmol/L (ref 22–32)
Calcium: 8.3 mg/dL — ABNORMAL LOW (ref 8.9–10.3)
Chloride: 101 mmol/L (ref 98–111)
Creatinine, Ser: 0.93 mg/dL (ref 0.44–1.00)
GFR, Estimated: 60 mL/min — ABNORMAL LOW (ref >60)
Glucose, Bld: 313 mg/dL — ABNORMAL HIGH (ref 70–99)
Potassium: 4.7 mmol/L (ref 3.5–5.1)
Sodium: 134 mmol/L — ABNORMAL LOW (ref 135–145)

## 2024-04-04 MED ORDER — INSULIN ASPART 100 UNIT/ML IJ SOLN
10.0000 [IU] | Freq: Once | INTRAMUSCULAR | Status: AC
  Administered 2024-04-04: 10 [IU] via SUBCUTANEOUS

## 2024-04-04 MED ORDER — INSULIN GLARGINE-YFGN 100 UNIT/ML ~~LOC~~ SOLN
15.0000 [IU] | Freq: Every day | SUBCUTANEOUS | Status: DC
  Administered 2024-04-04 – 2024-04-06 (×3): 15 [IU] via SUBCUTANEOUS
  Filled 2024-04-04 (×4): qty 0.15

## 2024-04-04 MED ORDER — APIXABAN 2.5 MG PO TABS
2.5000 mg | ORAL_TABLET | Freq: Two times a day (BID) | ORAL | Status: DC
  Administered 2024-04-04 – 2024-04-06 (×3): 2.5 mg via ORAL
  Filled 2024-04-04 (×5): qty 1

## 2024-04-04 MED ORDER — INSULIN ASPART 100 UNIT/ML IV SOLN: 10.0000 [IU] | Freq: Once | INTRAVENOUS | Status: DC

## 2024-04-04 MED ORDER — ENOXAPARIN SODIUM 40 MG/0.4ML IJ SOSY: 40.0000 mg | PREFILLED_SYRINGE | INTRAMUSCULAR | Status: DC

## 2024-04-04 MED ORDER — INSULIN ASPART 100 UNIT/ML IJ SOLN
0.0000 [IU] | Freq: Three times a day (TID) | INTRAMUSCULAR | Status: DC
  Administered 2024-04-04 – 2024-04-05 (×2): 8 [IU] via SUBCUTANEOUS
  Administered 2024-04-05: 5 [IU] via SUBCUTANEOUS
  Administered 2024-04-05: 11 [IU] via SUBCUTANEOUS
  Administered 2024-04-06: 3 [IU] via SUBCUTANEOUS
  Administered 2024-04-06: 5 [IU] via SUBCUTANEOUS
  Administered 2024-04-07: 11 [IU] via SUBCUTANEOUS
  Administered 2024-04-07: 3 [IU] via SUBCUTANEOUS
  Administered 2024-04-07: 5 [IU] via SUBCUTANEOUS
  Administered 2024-04-08 (×2): 15 [IU] via SUBCUTANEOUS
  Administered 2024-04-09: 3 [IU] via SUBCUTANEOUS
  Administered 2024-04-09: 5 [IU] via SUBCUTANEOUS
  Administered 2024-04-09: 3 [IU] via SUBCUTANEOUS
  Administered 2024-04-10: 5 [IU] via SUBCUTANEOUS
  Administered 2024-04-10: 2 [IU] via SUBCUTANEOUS
  Administered 2024-04-11 (×3): 3 [IU] via SUBCUTANEOUS
  Administered 2024-04-13: 8 [IU] via SUBCUTANEOUS
  Administered 2024-04-13: 3 [IU] via SUBCUTANEOUS

## 2024-04-04 NOTE — Inpatient Diabetes Management (Signed)
 Inpatient Diabetes Program Recommendations  AACE/ADA: New Consensus Statement on Inpatient Glycemic Control (2015)  Target Ranges:  Prepandial:   less than 140 mg/dL      Peak postprandial:   less than 180 mg/dL (1-2 hours)      Critically ill patients:  140 - 180 mg/dL   Lab Results  Component Value Date   GLUCAP 272 (H) 04/04/2024   HGBA1C 8.9 (H) 03/31/2024    Review of Glycemic Control  Latest Reference Range & Units 04/03/24 04:07 04/03/24 08:28 04/03/24 12:06 04/03/24 16:46 04/03/24 21:33 04/04/24 06:05  Glucose-Capillary 70 - 99 mg/dL 791 (H) 849 (H) 754 (H) 275 (H) 221 (H) 272 (H)   Diabetes history: DM 2 Outpatient Diabetes medications: Humalog  2-3 units tid SSI, Janumet  50-500 mg 1 tablet bid, Tresiba  11 units Daily Current orders for Inpatient glycemic control:  Semglee  10 units qhs Novolog  0-9 units Q4 hours  Glucerna tid between meals [name] 8.9% on 6/27 Note: Glucose trends increase after PO intake  Inpatient Diabetes Program Recommendations:    -  Start Novolog  2 units tid meal coverage if eating >50% of meals  Thanks,  Clotilda Bull RN, MSN, BC-ADM Inpatient Diabetes Coordinator Team Pager [phone] (8a-5p)

## 2024-04-04 NOTE — Progress Notes (Signed)
 Chaplain responded to a consult request for Advance Directive education. Jennifer Bailey had just completed a visit to the bathroom at the time of our visit and was feeling drowsy. Chaplain provided basic education and pt deferred creating documents. She states that she has a spouse.  Chaplain provided the Advance Directive packet as well as education on Advance Directives-documents an individual completes to communicate their health care directions in advance of a time when they may need them. Chaplain informed pt the documents which may be completed here in the hospital are the Living Will and Health Care Power of [place].   Chaplain informed that the Health Care Power of [place] is a legal document in which an individual names another person, their Health Care Agent, to make health care decisions when the individual is not able to make them for themselves. The Health Care Agent's function can be temporary or permanent depending on the pt's ability to make and communicate those decisions independently. Chaplain informed pt in the absence of a Health Care Power of Attorney, the state of [place]  directs health care providers to look to the following individuals in the order listed: legal guardian; an attorney?in?fact under a general power of attorney (POA) if that POA includes the right to make health care decisions; a husband or wife; a majority of parents and adult children; a majority of adult brothers and sisters; or an individual who has an established relationship with you, who is acting in good faith and who can convey your wishes.  If none of these person are available or willing to make medical decisions on a patient's behalf, the law allows the patient's doctor to make decisions for them as long as another doctor agrees with those decisions.  Chaplain also informed the patient that the Health Care agent has no decision-making authority over any affairs other than those related to his or her medical  care.   The chaplain further educated the pt that a Living Will is a legal document that allows an individual to state his or her desire not to receive life-prolonging measures in the event that they have a condition that is incurable and will result in their death in a short period of time; they are unconscious, and doctors are confident that they will not regain consciousness; and/or they have advanced dementia or other substantial and irreversible loss of mental function. The chaplain informed pt that life-prolonging measures are medical treatments that would only serve to postpone death, including breathing machines, kidney dialysis, antibiotics, artificial nutrition and hydration (tube feeding), and similar forms of treatment and that if an individual is able to express their wishes, they may also make them known without the use of a Living Will, but in the event that an individual is not able to express their wishes themselves, a Living Will allows medical providers and the pt's family and friends ensure that they are not making decisions on the pt's behalf, but rather serving as the pt's voice to convey decisions the pt has already made.   The patient is aware that the decision to create an advance directive is theirs alone and they may chose not to complete the documents or may chose to complete one portion or both.  The patient was informed that they can revoke the documents at any time by striking through them and writing void or by completing new documents, but that it is also advisable that the individual verbally notify interested parties that their wishes have changed.  They  are also aware that the document must be signed in the presence of a notary public and two witnesses and that this can be done while the patient is still admitted to the hospital or after discharge in the community. If they decide to complete Advance Directives after being discharged from the hospital, they have been advised  to notify all interested parties and to provide those documents to their physicians and loved ones in addition to bringing them to the hospital in the event of another hospitalization.   The chaplain informed the pt that if they desire to proceed with completing Advance Directive Documentation while they are still admitted, notary services are typically available at [facility] between the hours of 1:00 and 3:30 Monday-Thursday.    When the patient is ready to have these documents completed, the patient should request that their nurse place a spiritual care consult and indicate that the patient is ready to have their advance directives notarized so that arrangements for witnesses and notary public can be made.  Please page spiritual care if the patient desires further education or has questions.     Alan HERO. Davee Lomax, M.Div. [facility] Chaplain Pager [phone] Office [phone]      04/04/24 1155  Spiritual Encounters  Type of Visit Initial  Care provided to: Patient  Conversation partners present during encounter Nurse  Reason for visit Advance directives  Advance Directives (For Healthcare)  Does Patient Have a Medical Advance Directive? No  Does patient want to make changes to medical advance directive? Yes (Inpatient - patient defers changing a medical advance directive at this time - Information given)

## 2024-04-04 NOTE — TOC Progression Note (Addendum)
 Transition of Care [facility]) - Progression Note    Patient Details  Name: Jennifer Bailey MRN: [id number] Date of Birth: 1935-05-15  [facility] CM/SW Contact  Luann SHAUNNA [place], [place] Phone Number: 04/04/2024, 3:49 PM  Clinical Narrative:     CSW called pt's daughter [name] for SNF choice. She explains she will have to discuss with her sister and call CSW back.   1540: CSW called pt's daughter [name] to follow up on SNF choice. Daughter chooses [facility] and is requesting private room. CSW awaiting confirmation from [facility]. They have questions about pt's accident.   1630: [facility] confirmed bed offer though it is a semi-private room; they can switch to private once room is available. SNF auth submitted in online portal.   Expected Discharge Plan: Skilled Nursing Facility Barriers to Discharge: Insurance Authorization  Expected Discharge Plan and Services       Living arrangements for the past 2 months: Single Family Home                                       Social Determinants of Health (SDOH) Interventions SDOH Screenings   Food Insecurity: No Food Insecurity (03/31/2024)  Housing: Low Risk  (03/31/2024)  Transportation Needs: No Transportation Needs (03/31/2024)  Utilities: Not At Risk (03/31/2024)  Depression (PHQ2-9): Low Risk  (02/07/2024)  Financial Resource Strain: Low Risk  (02/16/2023)  Physical Activity: Sufficiently Active (02/16/2023)  Social Connections: Moderately Isolated (03/31/2024)  Stress: No Stress Concern Present (02/16/2023)  Tobacco Use: Low Risk  (03/31/2024)    Readmission Risk Interventions     No data to display

## 2024-04-04 NOTE — Telephone Encounter (Signed)
 Patient Product/process development scientist completed.    The patient is insured through [employer]. Patient has Medicare and is not eligible for a copay card, but may be able to apply for patient assistance or Medicare RX Payment Plan (Patient Must reach out to their plan, if eligible for payment plan), if available.    Ran test claim for Eliquis 2.5 mg and the current 30 day co-pay is $40.00.   This test claim was processed through [facility]- copay amounts may vary at other pharmacies due to pharmacy/plan contracts, or as the patient moves through the different stages of their insurance plan.     Roland Earl, CPHT Pharmacy Technician III Certified Patient Advocate [facility] Pharm[facility] Patient Advocate Team Direct Number: [phone]  Fax: [phone]

## 2024-04-04 NOTE — Progress Notes (Signed)
 Progress Note   Patient: Jennifer Bailey FMW:[id number] DOB: [birth year] DOA: 03/30/2024  DOS: the patient was seen and examined on 04/04/2024   Brief hospital course:  89 y.o. female with history of diabetes melitis type II, hypertension, hypothyroidism, hyperlipidemia [place].  Denies hitting head or losing consciousness has been having right hip pain after the fall.    Assessment and Plan:   Acute right intertrochanteric fracture - Secondary to ground-level mechanical fall.  Orthopedic surgery consulted and following closely.  Status post intramedullary nail.  Weightbearing as tolerated RLE.  Will transition to p.o. Eliquis 2.5 mg twice daily for 30 days.  Vitamin D  supplementation.  Norco 5 mg as needed.  Can follow-up with orthopedic surgery Dr. Kendal in the outpatient setting in 2 weeks.   Acute kidney injury - Initial presentation likely prerenal etiology given dehydration.  Holding diuretics/ARB.  IV fluid hydration on board.  Continues to show improvement.  Will monitor urine output recheck BMP and magnesium in AM.   Mild hyponatremia - Likely secondary to above.  IV fluid hydration on board.  Showing improvement.  Will recheck BMP and magnesium in AM.   Insulin -dependent diabetes mellitus - Not well controlled.  Will increase to 15 units insulin  glargine with moderate insulin  sliding scale on board.  [name] 8.9 suggesting poor control but improving.  Last known [name] greater than 10 approximately 4 months ago.   Hypertension - Restart losartan .   Chronic normocytic anemia - Appears stable, but boarderline, 7.1 this am.  Will recheck CBC in a.m. transfuse if less than 7.   Hypothyroidism - Continue Synthroid .     Physical debilitation muscle weakness - PT/OT on board.  Likely transition to SNF.  Working closely with TOC, family on disposition planning.  Awaiting auth.   Subjective: Patient sitting in bedside chair this  morning.    Denies shortness of breath, nausea, vomiting, abdominal pain.  Physical Exam:  Vitals:   04/03/24 1648 04/03/24 2155 04/04/24 0502 04/04/24 0836  BP: (!) 141/44 (!) 154/90 (!) 167/37 (!) 136/33  Pulse: 79 73 90 81  Resp: 17 19 20 13   Temp: 99.1 F (37.3 C) 97.7 F (36.5 C) (!) 97.3 F (36.3 C) 98 F (36.7 C)  TempSrc:    Oral  SpO2: 98% 97% 97% 99%  Weight:      Height:        GENERAL:  Alert, pleasant, no acute distress, frail HEENT:  EOMI CARDIOVASCULAR:  RRR, no murmurs appreciated RESPIRATORY:  Clear to auscultation, no wheezing, rales, or rhonchi GASTROINTESTINAL:  Soft, nontender, nondistended EXTREMITIES: RLE shortened and externally rotated NEURO:  No new focal deficits appreciated SKIN: Pale, no rashes noted PSYCH:  Appropriate mood and affect    Data Reviewed:  No new imaging to review  Previous records (including but not limited to H&P, progress notes, nursing notes, TOC management) were reviewed in assessment of this patient.  Labs: CBC: Recent Labs  Lab 03/30/24 1753 03/31/24 0500 04/01/24 0824 04/02/24 1124 04/03/24 0723 04/04/24 0604  WBC 14.8* 11.3* 11.9* 11.6* 12.6* 10.7*  NEUTROABS 12.0*  --   --   --   --   --   HGB 10.9* 10.4* 7.5* 7.1* 7.4* 7.1*  HCT 31.5* 30.5* 22.3* 21.4* 22.4* 21.2*  MCV 88.5 90.8 89.6 91.8 91.1 91.4  PLT 266 240 209 191 274 265   Basic Metabolic Panel: Recent Labs  Lab 03/31/24 0500 04/01/24 0824  04/02/24 1124 04/03/24 0723 04/04/24 0604  NA 133* 136 132* 133* 134*  K 3.7 4.2 4.1 3.7 4.7  CL 99 102 97* 100 101  CO2 22 22 23 23 24   GLUCOSE 190* 182* 218* 138* 313*  BUN 16 13 18 16 15   CREATININE 0.98 1.32* 1.06* 1.02* 0.93  CALCIUM 8.9 8.8* 8.3* 8.4* 8.3*  MG  --  1.9 2.1  --   --    Liver Function Tests: No results for input(s): AST, ALT, ALKPHOS, BILITOT, PROT, ALBUMIN in the last 168 hours. CBG: Recent Labs  Lab 04/03/24 0828 04/03/24 1206 04/03/24 1646 04/03/24 2133  04/04/24 0605  GLUCAP 150* 245* 275* 221* 272*    Scheduled Meds:  (feeding supplement) PROSource Plus  30 mL Oral BID BM   aspirin  EC  81 mg Oral Daily   brimonidine   1 drop Left Eye BID   cholecalciferol  1,000 Units Oral Daily   dorzolamide -timolol   1 drop Left Eye BID   [START ON 04/05/2024] enoxaparin  (LOVENOX ) injection  40 mg Subcutaneous Q24H   feeding supplement (GLUCERNA SHAKE)  237 mL Oral TID BM   insulin  aspart  0-15 Units Subcutaneous TID WC   insulin  glargine-yfgn  15 Units Subcutaneous QHS   levothyroxine   100 mcg Oral Q0600   losartan   50 mg Oral Daily   multivitamin with minerals  1 tablet Oral Daily   pravastatin   40 mg Oral q1800   Continuous Infusions: PRN Meds:.hydrALAZINE , HYDROcodone -acetaminophen , ondansetron  (ZOFRAN ) IV, traZODone   Family Communication: None at bedside  Disposition: Status is: Inpatient Remains inpatient appropriate because: Disposition, right hip fracture     Time spent: 35 minutes  Length of inpatient stay: 5 days  Author: Carliss LELON Canales, DO 04/04/2024 11:51 AM  For on call review [email or web address].

## 2024-04-04 NOTE — Progress Notes (Signed)
 Orthopaedic Trauma Progress Note  SUBJECTIVE: Doing okay this morning.  Notes pain around the right hip is improving.  No chest pain. No SOB. No nausea/vomiting. No other complaints. No family at bedside currently.  Patient agreeable to SNF.  OBJECTIVE:  Vitals:   04/04/24 0502 04/04/24 0836  BP: (!) 167/37 (!) 136/33  Pulse: 90 81  Resp: 20 13  Temp: (!) 97.3 F (36.3 C) 98 F (36.7 C)  SpO2: 97% 99%    Opiates Today (MME): Today's  total administered Morphine  Milligram Equivalents: 5 Opiates Yesterday (MME): Yesterday's total administered Morphine  Milligram Equivalents: 13  General: Sitting up in bed, no acute distress Respiratory: No increased work of breathing.  Operative Extremity (RLE): Incisions are CDI.  Notable bruising and swelling over the expected.  Soreness with palpation over this area.  Ankle motion stiff, this is baseline.  Able to wiggle the toes slightly.  Endorses sensation light touch over the foot.  + DP pulse  IMAGING: Stable post op imaging.   LABS:  Results for orders placed or performed during the hospital encounter of 03/30/24 (from the past 24 hours)  Glucose, capillary     Status: Abnormal   Collection Time: 04/03/24 12:06 PM  Result Value Ref Range   Glucose-Capillary 245 (H) 70 - 99 mg/dL  Glucose, capillary     Status: Abnormal   Collection Time: 04/03/24  4:46 PM  Result Value Ref Range   Glucose-Capillary 275 (H) 70 - 99 mg/dL  Glucose, capillary     Status: Abnormal   Collection Time: 04/03/24  9:33 PM  Result Value Ref Range   Glucose-Capillary 221 (H) 70 - 99 mg/dL  CBC     Status: Abnormal   Collection Time: 04/04/24  6:04 AM  Result Value Ref Range   WBC 10.7 (H) 4.0 - 10.5 K/uL   RBC 2.32 (L) 3.87 - 5.11 MIL/uL   Hemoglobin 7.1 (L) 12.0 - 15.0 g/dL   HCT 78.7 (L) 63.9 - 53.9 %   MCV 91.4 80.0 - 100.0 fL   MCH 30.6 26.0 - 34.0 pg   MCHC 33.5 30.0 - 36.0 g/dL   RDW 86.1 88.4 - 84.4 %   Platelets 265 150 - 400 K/uL   nRBC 0.2 0.0  - 0.2 %  Basic metabolic panel with GFR     Status: Abnormal   Collection Time: 04/04/24  6:04 AM  Result Value Ref Range   Sodium 134 (L) 135 - 145 mmol/L   Potassium 4.7 3.5 - 5.1 mmol/L   Chloride 101 98 - 111 mmol/L   CO2 24 22 - 32 mmol/L   Glucose, Bld 313 (H) 70 - 99 mg/dL   BUN 15 8 - 23 mg/dL   Creatinine, Ser 9.06 0.44 - 1.00 mg/dL   Calcium 8.3 (L) 8.9 - 10.3 mg/dL   GFR, Estimated 60 (L) >60 mL/min   Anion gap 9 5 - 15  Glucose, capillary     Status: Abnormal   Collection Time: 04/04/24  6:05 AM  Result Value Ref Range   Glucose-Capillary 272 (H) 70 - 99 mg/dL    ASSESSMENT: Jennifer Bailey is a 89 y.o. female, 4 Days Post-Op s/p ground level fall Procedures: INTRAMEDULLARY NAIL RIGHT INTERTROCHANTERIC FEMUR FRACTURE  CV/Blood loss: Acute blood loss anemia, Hgb 7.1 this AM. Continue to monitor  PLAN: Weightbearing: WBAT RLE ROM:  unrestricted ROM  Incisional and dressing care: Okay to leave incisions open to air Showering:  ok for incisions to get  wet Orthopedic device(s): None  Pain management: Continue multimodal regimen VTE prophylaxis: Lovenox , SCDs ID:  Ancef  2gm post op completed Foley/Lines:  No foley, KVO IVFs Dispo: PT/OT ongoing, recommending SNF. TOC following for placement. Okay for discharge from ortho standpoint once cleared by medicine team and therapies.  I have signed and placed discharge Rx for pain medication, DVT prophylaxis, vitamin D  supplementation patient's chart  D/C recommendations: - Norco, Tylenol  for pain control - Eliquis 2.5 mg BID x 30 days for DVT prophylaxis - Continue 1000 units Vit D supplementation daily  Follow - up plan: 2 weeks after d/c for wound check and repeat x-rays   Contact information:  Franky Light MD, Lauraine Moores PA-C. After hours and holidays please check [email or web address] for group call information for [facility]   Lauraine PATRIC Moores, PA-C [phone] (office) [email or web address]

## 2024-04-05 DIAGNOSIS — N179 Acute kidney failure, unspecified: Secondary | ICD-10-CM | POA: Diagnosis not present

## 2024-04-05 DIAGNOSIS — E063 Autoimmune thyroiditis: Secondary | ICD-10-CM | POA: Diagnosis not present

## 2024-04-05 DIAGNOSIS — S72001A Fracture of unspecified part of neck of right femur, initial encounter for closed fracture: Secondary | ICD-10-CM | POA: Diagnosis not present

## 2024-04-05 DIAGNOSIS — E11649 Type 2 diabetes mellitus with hypoglycemia without coma: Secondary | ICD-10-CM | POA: Diagnosis not present

## 2024-04-05 DIAGNOSIS — E1169 Type 2 diabetes mellitus with other specified complication: Secondary | ICD-10-CM

## 2024-04-05 DIAGNOSIS — E785 Hyperlipidemia, unspecified: Secondary | ICD-10-CM

## 2024-04-05 DIAGNOSIS — F320 Major depressive disorder, single episode, mild: Secondary | ICD-10-CM

## 2024-04-05 LAB — CBC
HCT: 20.5 % — ABNORMAL LOW (ref 36.0–46.0)
Hemoglobin: 6.8 g/dL — CL (ref 12.0–15.0)
MCH: 30.4 pg (ref 26.0–34.0)
MCHC: 33.2 g/dL (ref 30.0–36.0)
MCV: 91.5 fL (ref 80.0–100.0)
Platelets: 305 10*3/uL (ref 150–400)
RBC: 2.24 MIL/uL — ABNORMAL LOW (ref 3.87–5.11)
RDW: 14 % (ref 11.5–15.5)
WBC: 10.8 10*3/uL — ABNORMAL HIGH (ref 4.0–10.5)
nRBC: 0.3 % — ABNORMAL HIGH (ref 0.0–0.2)

## 2024-04-05 LAB — RETICULOCYTES
Immature Retic Fract: 41 % — ABNORMAL HIGH (ref 2.3–15.9)
RBC.: 2.39 MIL/uL — ABNORMAL LOW (ref 3.87–5.11)
Retic Count, Absolute: 117.3 10*3/uL (ref 19.0–186.0)
Retic Ct Pct: 4.9 % — ABNORMAL HIGH (ref 0.4–3.1)

## 2024-04-05 LAB — BASIC METABOLIC PANEL WITH GFR
Anion gap: 9 (ref 5–15)
BUN: 13 mg/dL (ref 8–23)
CO2: 24 mmol/L (ref 22–32)
Calcium: 7.9 mg/dL — ABNORMAL LOW (ref 8.9–10.3)
Chloride: 99 mmol/L (ref 98–111)
Creatinine, Ser: 0.82 mg/dL (ref 0.44–1.00)
GFR, Estimated: >60 mL/min (ref >60)
Glucose, Bld: 247 mg/dL — ABNORMAL HIGH (ref 70–99)
Potassium: 4.7 mmol/L (ref 3.5–5.1)
Sodium: 132 mmol/L — ABNORMAL LOW (ref 135–145)

## 2024-04-05 LAB — IRON AND TIBC
Iron: 50 ug/dL (ref 28–170)
Saturation Ratios: 22 % (ref 10.4–31.8)
TIBC: 224 ug/dL — ABNORMAL LOW (ref 250–450)
UIBC: 174 ug/dL

## 2024-04-05 LAB — FOLATE: Folate: 13.6 ng/mL (ref >5.9)

## 2024-04-05 LAB — GLUCOSE, CAPILLARY
Glucose-Capillary: 226 mg/dL — ABNORMAL HIGH (ref 70–99)
Glucose-Capillary: 269 mg/dL — ABNORMAL HIGH (ref 70–99)
Glucose-Capillary: 315 mg/dL — ABNORMAL HIGH (ref 70–99)
Glucose-Capillary: 191 mg/dL — ABNORMAL HIGH (ref 70–99)

## 2024-04-05 LAB — FERRITIN: Ferritin: 138 ng/mL (ref 11–307)

## 2024-04-05 LAB — PREPARE RBC (CROSSMATCH)

## 2024-04-05 LAB — HEMOGLOBIN AND HEMATOCRIT, BLOOD
HCT: 22 % — ABNORMAL LOW (ref 36.0–46.0)
Hemoglobin: 7.4 g/dL — ABNORMAL LOW (ref 12.0–15.0)

## 2024-04-05 LAB — VITAMIN B12: Vitamin B-12: 505 pg/mL (ref 180–914)

## 2024-04-05 MED ORDER — SODIUM CHLORIDE 0.9% IV SOLUTION: Freq: Once | INTRAVENOUS | Status: AC

## 2024-04-05 MED ORDER — ACETAMINOPHEN 325 MG PO TABS
650.0000 mg | ORAL_TABLET | Freq: Four times a day (QID) | ORAL | Status: DC | PRN
  Administered 2024-04-05: 650 mg via ORAL
  Filled 2024-04-05: qty 2

## 2024-04-05 MED ORDER — FE FUM-VIT C-VIT B12-FA 460-60-0.01-1 MG PO CAPS
1.0000 | ORAL_CAPSULE | Freq: Two times a day (BID) | ORAL | Status: DC
  Administered 2024-04-05 – 2024-04-13 (×17): 1 via ORAL
  Filled 2024-04-05 (×18): qty 1

## 2024-04-05 NOTE — TOC Progression Note (Signed)
 Transition of Care [facility]) - Progression Note    Patient Details  Name: Jennifer Bailey MRN: [id number] Date of Birth: Mar 12, 1935  [facility]) CM/SW Contact  Dyke Weible A [place], LCSW Phone Number: 04/05/2024, 12:46 PM  Clinical Narrative:     Pt's authorization approved for [facility]. Auth ID: [id number],  [facility] notified, pt has room 204.   Provider informed CSW that pt has blood transfusion  scheduled for today, possible DC tomorrow, [facility] notified of update to DC date.    TOC will continue to follow.     Expected Discharge Plan: Skilled Nursing Facility Barriers to Discharge: Continued Medical Work up, SNF Pending bed offer, Insurance Authorization  Expected Discharge Plan and Services       Living arrangements for the past 2 months: Single Family Home                                       Social Determinants of Health (SDOH) Interventions SDOH Screenings   Food Insecurity: No Food Insecurity (03/31/2024)  Housing: Low Risk  (03/31/2024)  Transportation Needs: No Transportation Needs (03/31/2024)  Utilities: Not At Risk (03/31/2024)  Depression (PHQ2-9): Low Risk  (02/07/2024)  Financial Resource Strain: Low Risk  (02/16/2023)  Physical Activity: Sufficiently Active (02/16/2023)  Social Connections: Moderately Isolated (03/31/2024)  Stress: No Stress Concern Present (02/16/2023)  Tobacco Use: Low Risk  (03/31/2024)    Readmission Risk Interventions     No data to display

## 2024-04-05 NOTE — Inpatient Diabetes Management (Signed)
 Inpatient Diabetes Program Recommendations  AACE/ADA: New Consensus Statement on Inpatient Glycemic Control (2015)  Target Ranges:  Prepandial:   less than 140 mg/dL      Peak postprandial:   less than 180 mg/dL (1-2 hours)      Critically ill patients:  140 - 180 mg/dL   Lab Results  Component Value Date   GLUCAP 226 (H) 04/05/2024   HGBA1C 8.9 (H) 03/31/2024    Review of Glycemic Control  Latest Reference Range & Units 04/03/24 04:07 04/03/24 08:28 04/03/24 12:06 04/03/24 16:46 04/03/24 21:33 04/04/24 06:05  Glucose-Capillary 70 - 99 mg/dL 791 (H) 849 (H) 754 (H) 275 (H) 221 (H) 272 (H)    Latest Reference Range & Units 04/04/24 06:05 04/04/24 11:56 04/04/24 16:43 04/04/24 21:45 04/05/24 06:09  Glucose-Capillary 70 - 99 mg/dL 727 (H) 595 (H) 718 (H) 211 (H) 226 (H)   Diabetes history: DM 2 Outpatient Diabetes medications: Humalog  2-3 units tid SSI, Janumet  50-500 mg 1 tablet bid, Tresiba  11 units Daily Current orders for Inpatient glycemic control:  Semglee  15 units qhs (increased this am 7/2) Novolog  0-9 units Q4 hours  Glucerna tid between meals [name] 8.9% on 6/27 Note: Glucose trends increase after PO intake  Inpatient Diabetes Program Recommendations:    -  Start Novolog  2 units tid meal coverage if eating >50% of meals  Thanks,  Clotilda Bull RN, MSN, BC-ADM Inpatient Diabetes Coordinator Team Pager [phone] (8a-5p)

## 2024-04-05 NOTE — Progress Notes (Signed)
 Progress Note   Patient: Jennifer Bailey FMW:[id number] DOB: 21-Apr-1935 DOA: 03/30/2024  DOS: the patient was seen and examined on 04/05/2024   Brief hospital course:  89 y.o. female with history of diabetes melitis type II, hypertension, hypothyroidism, hyperlipidemia [place].  Denies hitting head or losing consciousness has been having right hip pain after the fall.   7/2: Hemoglobin decreased to 6.8 this morning-ordered 1 unit of PRBC.  Anemia panel without any significant deficiency, consistent with anemia of chronic disease. Obtained insurance authorization today-can discharge to SNF tomorrow.   Assessment and Plan:   Acute right intertrochanteric fracture - Secondary to ground-level mechanical fall.  Orthopedic surgery consulted and following closely.  Status post intramedullary nail.  Weightbearing as tolerated RLE.  Will transition to p.o. Eliquis 2.5 mg twice daily for 30 days.  Vitamin D  supplementation.  Norco 5 mg as needed.  Can follow-up with orthopedic surgery Dr. Kendal in the outpatient setting in 2 weeks.   Acute kidney injury Resolved   Mild hyponatremia - Likely secondary to above.  Improved, likely pseudo hyponatremia secondary to hyperglycemia   Insulin -dependent diabetes mellitus - Not well controlled.  Will increase to 15 units insulin  glargine with moderate insulin  sliding scale on board.  [name] 8.9 suggesting poor control but improving.  Last known [name] greater than 10 approximately 4 months ago.   Hypertension - Continue losartan .   Chronic normocytic anemia Hemoglobin at 6.8 this morning.  Anemia panel consistent with anemia of chronic disease. - Ordered 1 unit of PRBC -Continue to monitor   Hypothyroidism - Continue Synthroid .     Physical debilitation muscle weakness - PT/OT on board.  Likely transition to SNF.  Working closely with TOC, family on disposition planning.  Obtained insurance  authorization-will go to the facility tomorrow  Subjective: Patient was resting comfortably in a chair when seen today.  No new concern.  Physical Exam:  Vitals:   04/05/24 0815 04/05/24 1230 04/05/24 1300 04/05/24 1315  BP: (!) 121/41 (!) 143/38 (!) 143/38 (!) 132/46  Pulse: 88 83 83 78  Resp: 17 16 16 16   Temp: 98.8 F (37.1 C) 97.7 F (36.5 C) 97.7 F (36.5 C) 98.3 F (36.8 C)  TempSrc: Oral Oral Oral Oral  SpO2: 99% 100% 100% 100%  Weight:      Height:       General.  Frail elderly lady, in no acute distress. Pulmonary.  Lungs clear bilaterally, normal respiratory effort. CV.  Regular rate and rhythm, no JVD, rub or murmur. Abdomen.  Soft, nontender, nondistended, BS positive. CNS.  Somnolent but arousable.  No focal neurologic deficit. Extremities.  No edema, pulses intact and symmetrical. Psychiatry.  Judgment and insight appears normal.   Data Reviewed: Prior data reviewed  Labs: CBC: Recent Labs  Lab 03/30/24 1753 03/31/24 0500 04/01/24 0824 04/02/24 1124 04/03/24 0723 04/04/24 0604 04/05/24 0542  WBC 14.8*   < > 11.9* 11.6* 12.6* 10.7* 10.8*  NEUTROABS 12.0*  --   --   --   --   --   --   HGB 10.9*   < > 7.5* 7.1* 7.4* 7.1* 6.8*  HCT 31.5*   < > 22.3* 21.4* 22.4* 21.2* 20.5*  MCV 88.5   < > 89.6 91.8 91.1 91.4 91.5  PLT 266   < > 209 191 274 265 305   < > = values in this interval not displayed.   Basic Metabolic  Panel: Recent Labs  Lab 04/01/24 0824 04/02/24 1124 04/03/24 0723 04/04/24 0604 04/05/24 0542  NA 136 132* 133* 134* 132*  K 4.2 4.1 3.7 4.7 4.7  CL 102 97* 100 101 99  CO2 22 23 23 24 24   GLUCOSE 182* 218* 138* 313* 247*  BUN 13 18 16 15 13   CREATININE 1.32* 1.06* 1.02* 0.93 0.82  CALCIUM 8.8* 8.3* 8.4* 8.3* 7.9*  MG 1.9 2.1  --   --   --    Liver Function Tests: No results for input(s): AST, ALT, ALKPHOS, BILITOT, PROT, ALBUMIN in the last 168 hours. CBG: Recent Labs  Lab 04/04/24 1156 04/04/24 1643  04/04/24 2145 04/05/24 0609 04/05/24 1114  GLUCAP 404* 281* 211* 226* 269*    Scheduled Meds:  (feeding supplement) PROSource Plus  30 mL Oral BID BM   apixaban  2.5 mg Oral BID   aspirin  EC  81 mg Oral Daily   brimonidine   1 drop Left Eye BID   cholecalciferol  1,000 Units Oral Daily   dorzolamide -timolol   1 drop Left Eye BID   Fe Fum-Vit C-Vit B12-FA  1 capsule Oral BID   feeding supplement (GLUCERNA SHAKE)  237 mL Oral TID BM   insulin  aspart  0-15 Units Subcutaneous TID WC   insulin  glargine-yfgn  15 Units Subcutaneous QHS   levothyroxine   100 mcg Oral Q0600   losartan   50 mg Oral Daily   multivitamin with minerals  1 tablet Oral Daily   pravastatin   40 mg Oral q1800   Continuous Infusions: PRN Meds:.acetaminophen , hydrALAZINE , HYDROcodone -acetaminophen , ondansetron  (ZOFRAN ) IV, traZODone   Family Communication: Talked with daughter on phone. Disposition: Status is: Inpatient Remains inpatient appropriate because: Disposition, right hip fracture  DVT prophylaxis. Eliquis Time spent: 45 minutes  Length of inpatient stay: 6 days  Author: Amaryllis Dare, MD 04/05/2024 2:53 PM  For on call review [email or web address].

## 2024-04-05 NOTE — Progress Notes (Signed)
 PT Cancellation Note  Patient Details Name: Jennifer Bailey MRN: [id number] DOB: August 08, 1935   Cancelled Treatment:    Reason Eval/Treat Not Completed: (P) Medical issues which prohibited therapy (Low HGB, difficulty rousing, will defer PT today, plans for blood transfusion.  HGB 6.8)   [name] 04/05/2024, 12:09 PM  Toya HAMS , PTA Acute Rehabilitation Services Office [phone]

## 2024-04-06 DIAGNOSIS — E063 Autoimmune thyroiditis: Secondary | ICD-10-CM | POA: Diagnosis not present

## 2024-04-06 DIAGNOSIS — N179 Acute kidney failure, unspecified: Secondary | ICD-10-CM | POA: Diagnosis not present

## 2024-04-06 DIAGNOSIS — E1159 Type 2 diabetes mellitus with other circulatory complications: Secondary | ICD-10-CM | POA: Diagnosis not present

## 2024-04-06 DIAGNOSIS — S72001A Fracture of unspecified part of neck of right femur, initial encounter for closed fracture: Secondary | ICD-10-CM | POA: Diagnosis not present

## 2024-04-06 LAB — BASIC METABOLIC PANEL WITH GFR
Anion gap: 9 (ref 5–15)
BUN: 14 mg/dL (ref 8–23)
CO2: 24 mmol/L (ref 22–32)
Calcium: 8.5 mg/dL — ABNORMAL LOW (ref 8.9–10.3)
Chloride: 100 mmol/L (ref 98–111)
Creatinine, Ser: 0.9 mg/dL (ref 0.44–1.00)
GFR, Estimated: >60 mL/min (ref >60)
Glucose, Bld: 205 mg/dL — ABNORMAL HIGH (ref 70–99)
Potassium: 4.4 mmol/L (ref 3.5–5.1)
Sodium: 133 mmol/L — ABNORMAL LOW (ref 135–145)

## 2024-04-06 LAB — PREPARE RBC (CROSSMATCH)

## 2024-04-06 LAB — CBC
HCT: 21.7 % — ABNORMAL LOW (ref 36.0–46.0)
HCT: 20.7 % — ABNORMAL LOW (ref 36.0–46.0)
Hemoglobin: 7.1 g/dL — ABNORMAL LOW (ref 12.0–15.0)
Hemoglobin: 6.9 g/dL — CL (ref 12.0–15.0)
MCH: 30.5 pg (ref 26.0–34.0)
MCH: 30.9 pg (ref 26.0–34.0)
MCHC: 32.7 g/dL (ref 30.0–36.0)
MCHC: 33.3 g/dL (ref 30.0–36.0)
MCV: 93.1 fL (ref 80.0–100.0)
MCV: 92.8 fL (ref 80.0–100.0)
Platelets: 371 10*3/uL (ref 150–400)
Platelets: 357 10*3/uL (ref 150–400)
RBC: 2.33 MIL/uL — ABNORMAL LOW (ref 3.87–5.11)
RBC: 2.23 MIL/uL — ABNORMAL LOW (ref 3.87–5.11)
RDW: 14.8 % (ref 11.5–15.5)
RDW: 15 % (ref 11.5–15.5)
WBC: 12.8 10*3/uL — ABNORMAL HIGH (ref 4.0–10.5)
WBC: 12.6 10*3/uL — ABNORMAL HIGH (ref 4.0–10.5)
nRBC: 0.2 % (ref 0.0–0.2)
nRBC: 0.2 % (ref 0.0–0.2)

## 2024-04-06 LAB — GLUCOSE, CAPILLARY
Glucose-Capillary: 187 mg/dL — ABNORMAL HIGH (ref 70–99)
Glucose-Capillary: 145 mg/dL — ABNORMAL HIGH (ref 70–99)
Glucose-Capillary: 220 mg/dL — ABNORMAL HIGH (ref 70–99)
Glucose-Capillary: 259 mg/dL — ABNORMAL HIGH (ref 70–99)

## 2024-04-06 LAB — HEMOGLOBIN AND HEMATOCRIT, BLOOD
HCT: 25.8 % — ABNORMAL LOW (ref 36.0–46.0)
Hemoglobin: 8.7 g/dL — ABNORMAL LOW (ref 12.0–15.0)

## 2024-04-06 LAB — VITAMIN D 25 HYDROXY (VIT D DEFICIENCY, FRACTURES): Vit D, 25-Hydroxy: 42.17 ng/mL (ref 30–100)

## 2024-04-06 MED ORDER — PROSOURCE PLUS PO LIQD: 30.0000 mL | Freq: Two times a day (BID) | ORAL | Status: DC

## 2024-04-06 MED ORDER — ACETAMINOPHEN 325 MG PO TABS
650.0000 mg | ORAL_TABLET | Freq: Four times a day (QID) | ORAL | Status: DC
  Administered 2024-04-06 – 2024-04-07 (×4): 650 mg via ORAL
  Filled 2024-04-06 (×4): qty 2

## 2024-04-06 MED ORDER — GLUCERNA SHAKE PO LIQD: 237.0000 mL | Freq: Three times a day (TID) | ORAL | Status: DC

## 2024-04-06 MED ORDER — SODIUM CHLORIDE 0.9% IV SOLUTION: Freq: Once | INTRAVENOUS | Status: AC

## 2024-04-06 MED ORDER — OXYCODONE HCL 5 MG PO TABS: 5.0000 mg | ORAL_TABLET | Freq: Three times a day (TID) | ORAL | Status: DC | PRN

## 2024-04-06 MED ORDER — TRESIBA FLEXTOUCH 100 UNIT/ML ~~LOC~~ SOPN: 15.0000 [IU] | PEN_INJECTOR | Freq: Every day | SUBCUTANEOUS | Status: DC

## 2024-04-06 MED ORDER — ORAL CARE MOUTH RINSE: 15.0000 mL | OROMUCOSAL | Status: DC | PRN

## 2024-04-06 MED ORDER — INSULIN ASPART 100 UNIT/ML IJ SOLN: 0.0000 [IU] | Freq: Three times a day (TID) | INTRAMUSCULAR | Status: DC

## 2024-04-06 NOTE — TOC Progression Note (Signed)
 Transition of Care [facility]) - Progression Note    Patient Details  Name: Jennifer Bailey MRN: [id number] Date of Birth: [birth year]  Transition of Care [facility]) CM/SW Contact  Gwenn Frieze [place], [place] Phone Number: 04/06/2024, 12:18 PM  Clinical Narrative:   Per MD, pt not stable for dc to [facility] today. Updated Tanya at [facility] who confirmed they are prepared to admit pt tomorrow if stable. SW will need to reach out to [place] if ready for [employer]. SW will continue to follow.   Frieze Gwenn, MSW, LCSW [phone] (coverage)      Expected Discharge Plan: Skilled Nursing Facility Barriers to Discharge: Continued Medical Work up, SNF Pending bed offer, Insurance Authorization  Expected Discharge Plan and Services       Living arrangements for the past 2 months: Single Family Home                                       Social Determinants of Health (SDOH) Interventions SDOH Screenings   Food Insecurity: No Food Insecurity (03/31/2024)  Housing: Low Risk  (03/31/2024)  Transportation Needs: No Transportation Needs (03/31/2024)  Utilities: Not At Risk (03/31/2024)  Depression (PHQ2-9): Low Risk  (02/07/2024)  Financial Resource Strain: Low Risk  (02/16/2023)  Physical Activity: Sufficiently Active (02/16/2023)  Social Connections: Moderately Isolated (03/31/2024)  Stress: No Stress Concern Present (02/16/2023)  Tobacco Use: Low Risk  (03/31/2024)    Readmission Risk Interventions     No data to display

## 2024-04-06 NOTE — Progress Notes (Signed)
 PROGRESS NOTE  Jennifer Bailey FMW:[id number] DOB: 1935-04-29   PCP: Oris Camie BRAVO, NP  Patient is from: Home  DOA: 03/30/2024 LOS: 7  Chief complaints Chief Complaint  Patient presents with   Fall   Leg Pain     Brief Narrative / Interim history: 89 year old F with PMH of DM-2, HTN, hypothyroidism, HLD and anemia of chronic disease presented to ED with right hip pain [place].  Denies hitting her head or LOC.  She was admitted with right intertrochanteric femoral fracture.  Underwent cephalomedullary nailing of right intertrochanteric femoral fracture by Dr. Kendal on 6/27.  Patient's hemoglobin dropped to 6.8 on 7/2.  She was transfused 1 unit without significant improvement.  No signs of overt bleeding.  Transfusing additional 1 unit on 7/3.   Therapy recommended SNF.  Subjective: Seen and examined earlier this morning.  No major events overnight or this morning.  She is sleepy but wakes to voice.  She is oriented to self and place but not able to stay awake much.  She denies pain.  Follows some commands.   Objective: Vitals:   04/05/24 1542 04/05/24 1929 04/06/24 0457 04/06/24 0720  BP: (!) 147/47 (!) 106/51 (!) 153/55 (!) 131/52  Pulse: 83 90 85 85  Resp: 18 18 19 16   Temp: 98.5 F (36.9 C) 98.1 F (36.7 C) 98.3 F (36.8 C) 97.6 F (36.4 C)  TempSrc: Oral   Oral  SpO2: 100% 98% 100% 98%  Weight:      Height:        Examination:  GENERAL: No apparent distress.  Nontoxic. HEENT: MMM.  Vision and hearing grossly intact.  NECK: Supple.  No apparent JVD.  RESP:  No IWOB.  Fair aeration bilaterally. CVS:  RRR. Heart sounds normal.  ABD/GI/GU: BS+. Abd soft, NTND.  MSK/EXT: Wiggles her toes.  Difficult to move her right leg. SKIN: Jaundice and small bruise over right thigh.  No signs of hematoma. NEURO: Sleepy but wakes to voice.  Not alert.  Falls asleep quickly.  Oriented to self and place.  No  apparent focal neuro deficit. PSYCH: Calm. Normal affect.   Consultants:  Orthopedic surgery  Procedures: 6/27-cephalomedullary nailing of right intertrochanteric femoral fracture by Dr. Kendal  Microbiology summarized: None  Assessment and plan: Ground-level fall[place] her.  Acute right intertrochanteric fracture due to fall -S/p cephalomedullary nailing of right intertrochanteric femoral fracture by Dr. Kendal on 6/27 -WBAT on RLE -Start scheduled Tylenol  with as needed oxy oxycodone  given somnolence -Hold Eliquis and aspirin  given anemia.  Discussed with Ortho. -SCD for VTE prophylaxis until H&H stable -Check vitamin D  level -PT/OT recommended SNF  IDDM-2 with hyperglycemia: [name] 8.9%.  Hyperglycemia improved. Recent Labs  Lab 04/05/24 1114 04/05/24 1613 04/05/24 2109 04/06/24 0615 04/06/24 1138  GLUCAP 269* 315* 191* 187* 145*  - Continue current insulin  regimen  Postoperative ABLA superimposed on anemia of chronic disease: Baseline Hgb 10-11.  Reported surgical EBL only 75 cc.  Anemia panel without deficiency.  Transfused 1 unit on 7/2 but Hgb remains low.  No overt bleeding.  Surgical site is slightly tight but no external bleeding or hematoma. Recent Labs    03/30/24 1753 03/31/24 0500 04/01/24 0824 04/02/24 1124 04/03/24 0723 04/04/24 0604 04/05/24 0542 04/05/24 1730 04/06/24 0555 04/06/24 1133  HGB 10.9* 10.4* 7.5* 7.1* 7.4* 7.1* 6.8* 7.4*  7.1* 6.9*  - Transfuse additional 1 unit - Holding Eliquis and aspirin  until H&H stable - Continue monitoring  Acute toxic encephalopathy: Likely toxic from opiate.  She is sleepy but wakes to voice.  Not able to stay awake long.  She has no focal neurodeficit other than difficulty moving RLE from surgery. -Discontinue Norco.  Scheduled Tylenol  with as needed oxycodone  -Reorientation and delirium precaution -Minimize avoid sedating medications -Encephalopathy  workup if no improvement   Essential hypertension: Normotensive for most part -Continue home losartan   Acute kidney injury: Resolved    Mild hyponatremia: Stable -Continue monitoring  Hypothyroidism -Continue Synthroid .     Physical debilitation muscle weakness -PT/OT recommended SNF.  Increased nutrient needs Body mass index is 26.96 kg/m. Nutrition Problem: Increased nutrient needs Etiology: hip fracture Signs/Symptoms: estimated needs Interventions: Refer to RD note for recommendations  DVT prophylaxis:  Place and maintain sequential compression device Start: 04/06/24 1239 SCDs Start: 03/30/24 2300  Code Status: Full code Family Communication: None at bedside Level of care: Telemetry Medical Status is: Inpatient Remains inpatient appropriate because: Right hip fracture, ABLA requiring blood transfusion, encephalopathy   Final disposition: SNF   55 minutes with more than 50% spent in reviewing records, counseling patient/family and coordinating care.   Sch Meds:  Scheduled Meds:  (feeding supplement) PROSource Plus  30 mL Oral BID BM   sodium chloride    Intravenous Once   acetaminophen   650 mg Oral Q6H WA   brimonidine   1 drop Left Eye BID   cholecalciferol  1,000 Units Oral Daily   dorzolamide -timolol   1 drop Left Eye BID   Fe Fum-Vit C-Vit B12-FA  1 capsule Oral BID   feeding supplement (GLUCERNA SHAKE)  237 mL Oral TID BM   insulin  aspart  0-15 Units Subcutaneous TID WC   insulin  glargine-yfgn  15 Units Subcutaneous QHS   levothyroxine   100 mcg Oral Q0600   losartan   50 mg Oral Daily   multivitamin with minerals  1 tablet Oral Daily   pravastatin   40 mg Oral q1800   Continuous Infusions: PRN Meds:.hydrALAZINE , ondansetron  (ZOFRAN ) IV, mouth rinse, oxyCODONE , traZODone   Antimicrobials: Anti-infectives (From admission, onward)    Start     Dose/Rate Route Frequency Ordered Stop   03/31/24 1915  ceFAZolin  (ANCEF ) IVPB 2g/100 mL premix        2  g 200 mL/hr over 30 Minutes Intravenous Every 8 hours 03/31/24 1333 04/01/24 1403   03/31/24 1032  ceFAZolin  (ANCEF ) 2-4 GM/100ML-% IVPB       Note to Pharmacy: Ezequiel Henri: cabinet override      03/31/24 1032 03/31/24 1127   03/31/24 1030  ceFAZolin  (ANCEF ) IVPB 2g/100 mL premix        2 g 200 mL/hr over 30 Minutes Intravenous On call to O.R. 03/31/24 1029 03/31/24 1123        I have personally reviewed the following labs and images: CBC: Recent Labs  Lab 03/30/24 1753 03/31/24 0500 04/03/24 0723 04/04/24 0604 04/05/24 0542 04/05/24 1730 04/06/24 0555 04/06/24 1133  WBC 14.8*   < > 12.6* 10.7* 10.8*  --  12.8* 12.6*  NEUTROABS 12.0*  --   --   --   --   --   --   --   HGB 10.9*   < > 7.4* 7.1* 6.8* 7.4* 7.1* 6.9*  HCT 31.5*   < > 22.4* 21.2* 20.5* 22.0* 21.7* 20.7*  MCV 88.5   < > 91.1 91.4 91.5  --  93.1 92.8  PLT 266   < > 274 265 305  --  371 357   < > = values in this interval not displayed.   BMP &GFR Recent Labs  Lab 04/01/24 0824 04/02/24 1124 04/03/24 0723 04/04/24 0604 04/05/24 0542 04/06/24 0555  NA 136 132* 133* 134* 132* 133*  K 4.2 4.1 3.7 4.7 4.7 4.4  CL 102 97* 100 101 99 100  CO2 22 23 23 24 24 24   GLUCOSE 182* 218* 138* 313* 247* 205*  BUN 13 18 16 15 13 14   CREATININE 1.32* 1.06* 1.02* 0.93 0.82 0.90  CALCIUM 8.8* 8.3* 8.4* 8.3* 7.9* 8.5*  MG 1.9 2.1  --   --   --   --    Estimated Creatinine Clearance: 33.9 mL/min (by C-G formula based on SCr of 0.9 mg/dL). Liver & Pancreas: No results for input(s): AST, ALT, ALKPHOS, BILITOT, PROT, ALBUMIN in the last 168 hours. No results for input(s): LIPASE, AMYLASE in the last 168 hours. No results for input(s): AMMONIA in the last 168 hours. Diabetic: No results for input(s): HGBA1C in the last 72 hours. Recent Labs  Lab 04/05/24 1114 04/05/24 1613 04/05/24 2109 04/06/24 0615 04/06/24 1138  GLUCAP 269* 315* 191* 187* 145*   Cardiac Enzymes: No results for  input(s): CKTOTAL, CKMB, CKMBINDEX, TROPONINI in the last 168 hours. No results for input(s): PROBNP in the last 8760 hours. Coagulation Profile: No results for input(s): INR, PROTIME in the last 168 hours. Thyroid  Function Tests: No results for input(s): TSH, T4TOTAL, FREET4, T3FREE, THYROIDAB in the last 72 hours. Lipid Profile: No results for input(s): CHOL, HDL, LDLCALC, TRIG, CHOLHDL, LDLDIRECT in the last 72 hours. Anemia Panel: Recent Labs    04/05/24 1014  VITAMINB12 505  FOLATE 13.6  FERRITIN 138  TIBC 224*  IRON 50  RETICCTPCT 4.9*   Urine analysis:    Component Value Date/Time   COLORURINE YELLOW 05/18/2022 1338   APPEARANCEUR CLEAR 05/18/2022 1338   LABSPEC 1.010 05/18/2022 1338   PHURINE 6.5 05/18/2022 1338   GLUCOSEU NEGATIVE 05/18/2022 1338   HGBUR NEGATIVE 05/18/2022 1338   BILIRUBINUR NEGATIVE 05/18/2022 1338   KETONESUR NEGATIVE 05/18/2022 1338   PROTEINUR NEGATIVE 04/29/2018 2300   UROBILINOGEN 0.2 05/18/2022 1338   NITRITE NEGATIVE 05/18/2022 1338   LEUKOCYTESUR TRACE (A) 05/18/2022 1338   Sepsis Labs: Invalid input(s): PROCALCITONIN, LACTICIDVEN  Microbiology: Recent Results (from the past 240 hours)  Surgical pcr screen     Status: None   Collection Time: 03/31/24 10:30 AM   Specimen: Nasal Mucosa; Nasal Swab  Result Value Ref Range Status   MRSA, PCR NEGATIVE NEGATIVE Final   Staphylococcus aureus NEGATIVE NEGATIVE Final    Comment: (NOTE) The Xpert SA Assay (FDA approved for NASAL specimens in patients 55 years of age and older), is one component of a comprehensive surveillance program. It is not intended to diagnose infection nor to guide or monitor treatment. Performed at [facility], [address] [place], [place] [address]     Radiology Studies: No results found.    Maysoon Lozada T. Shauna Bodkins [facility]  If 7PM-7AM, please contact night-coverage [email or web address] 04/06/2024, 12:40 PM

## 2024-04-06 NOTE — Inpatient Diabetes Management (Signed)
 Inpatient Diabetes Program Recommendations  AACE/ADA: New Consensus Statement on Inpatient Glycemic Control (2015)  Target Ranges:  Prepandial:   less than 140 mg/dL      Peak postprandial:   less than 180 mg/dL (1-2 hours)      Critically ill patients:  140 - 180 mg/dL   Lab Results  Component Value Date   GLUCAP 187 (H) 04/06/2024   HGBA1C 8.9 (H) 03/31/2024    Review of Glycemic Control  Latest Reference Range & Units 04/05/24 06:09 04/05/24 11:14 04/05/24 16:13 04/05/24 21:09 04/06/24 06:15  Glucose-Capillary 70 - 99 mg/dL 773 (H) 730 (H) 684 (H) 191 (H) 187 (H)   Diabetes history: DM 2 Outpatient Diabetes medications: Humalog  2-3 units tid SSI, Janumet  50-500 mg 1 tablet bid, Tresiba  11 units Daily Current orders for Inpatient glycemic control:  Semglee  15 units qhs (increased am 7/2) Novolog  0-9 units Q4 hours  Glucerna tid between meals [name] 8.9% on 6/27 Note: Glucose trends increase after PO intake  Inpatient Diabetes Program Recommendations:    -  Start Novolog  2 units tid meal coverage if eating >50% of meals  Thanks,  Clotilda Bull RN, MSN, BC-ADM Inpatient Diabetes Coordinator Team Pager [phone] (8a-5p)

## 2024-04-06 NOTE — Plan of Care (Signed)
  Problem: Coping: Goal: Ability to adjust to condition or change in health will improve Outcome: Progressing   Problem: Clinical Measurements: Goal: Ability to maintain clinical measurements within normal limits will improve Outcome: Progressing Goal: Will remain free from infection Outcome: Progressing Goal: Diagnostic test results will improve Outcome: Progressing Goal: Respiratory complications will improve Outcome: Progressing Goal: Cardiovascular complication will be avoided Outcome: Progressing   Problem: Pain Managment: Goal: General experience of comfort will improve and/or be controlled Outcome: Progressing

## 2024-04-06 NOTE — Progress Notes (Signed)
 Nutrition Follow Up  DOCUMENTATION CODES:   Non-severe (moderate) malnutrition in context of social or environmental circumstances (decline in functional status)  INTERVENTION:  Encourage PO intake Encourage increased protein intake Room service with assist  Glucerna Shake po TID, each supplement provides 220 kcal and 10 grams of protein Prosource Plus BID, each supplement contains 100 kcal and 15 gm protein  MVI with minerals daily 1,000 units vitamin D  daily  High calorie, high protein handout provided in AVS  NUTRITION DIAGNOSIS:   Moderate Malnutrition related to social / environmental circumstances (decline in functional status) as evidenced by moderate muscle depletion, mild fat depletion.  GOAL:   Patient will meet greater than or equal to 90% of their needs   MONITOR:   PO intake, Supplement acceptance  REASON FOR ASSESSMENT:   Consult Assessment of nutrition requirement/status, Hip fracture protocol  ASSESSMENT:  89 y.o. female with PMH of T2DM, HTN, HLD, hypothyroidism[place]. Found to have Right hip fracture s/p ORIF 6/27/025.  6/26 - Admitted  6/27 - s/p ORIF of right hip  7/2 - Hgb 6.8, administered 1 unit PRBC 7/3- additional unit PRBC  Pt has bed at SNF, but not medically ready for discharge yet due to very low hemoglobin requiring units of PRBC. Pt awake and alert during assessment. Pt's daughter at bedside. Pt's daughter reports pt's appetite has decreased since fall but seems to be improving each day. Daughter reports PTA pt would eat well when with family or friends but if pt was by herself she would not always eat. Pt used to go out and eat at restaurants and would eat variety of foods. Pt expressed she was in a lot of pain during follow up, discussed with RN.  Conducted nutrition focused physical exam which showed mild fat depletions and moderate muscle depletions which may be contributed to recent  decline in functional status reported by pt's daughter. Per PT, pt now requires help with mobility and ADLs as well as wheelchair assistance.   Admit weight: 58.5 kg Current weight: 58.5 kg    Average Meal Intake: 5/28-7/3: 47% intake x 8 recorded meals  Nutritionally Relevant Medications: Vitamin D3 Trigels-F Forte Novolog  SSI TID Semglee  15 units daily MVI w/ minerals  Labs reviewed:  CBG over last 24 hours: 187-315mg /dL Sodium 866 Hgb 6.9  NUTRITION - FOCUSED PHYSICAL EXAM:  Flowsheet Row Most Recent Value  Orbital Region Mild depletion  Upper Arm Region Moderate depletion  Thoracic and Lumbar Region Mild depletion  Buccal Region Mild depletion  Temple Region Mild depletion  Clavicle Bone Region Moderate depletion  Clavicle and Acromion Bone Region Moderate depletion  Scapular Bone Region Moderate depletion  Dorsal Hand Moderate depletion  Patellar Region Moderate depletion  [avoided R leg due to hip fx]  Anterior Thigh Region Moderate depletion  [avoided R leg due to hip fx]  Posterior Calf Region Moderate depletion  [avoided R leg due to hip fx]  Edema (RD Assessment) None  Hair Reviewed  Eyes Reviewed  Mouth Reviewed  Skin Reviewed  Nails Reviewed   Diet Order:   Diet Order             Diet - low sodium heart healthy           Diet Carb Modified           Diet regular Room service appropriate? Yes with Assist; Fluid consistency: Thin  Diet effective now  EDUCATION NEEDS:   Education needs have been addressed  Skin:  Skin Assessment: Skin Integrity Issues: Skin Integrity Issues:: Incisions, DTI DTI: R heel Incisions: Surgical incsion, right hip  Last BM:  PTA  Height:   Ht Readings from Last 1 Encounters:  03/31/24 4' 10 (1.473 m)   Weight:   Wt Readings from Last 1 Encounters:  03/31/24 58.5 kg    BMI:  Body mass index is 26.96 kg/m.  Estimated Nutritional Needs:   Kcal:  1500-1800 kcal  Protein:  80-100  gm  Fluid:  >1.5L/day   Josette Glance, MS, RDN, LDN Clinical Dietitian I Please reach out via secure chat

## 2024-04-06 NOTE — Progress Notes (Signed)
 Physical Therapy Treatment Patient Details Name: Jennifer Bailey MRN: [id number] DOB: Jan 09, 1935 Today's Date: 04/06/2024   History of Present Illness Pt is an 89 y/o F presenting to ED on 6/26 with R hip pain after fall, found to have R intertrochanteric hip fx s/p IMN on 6/27. PMH includes DM2, hypothyroidism, HTN, HLD    PT Comments  Pt supine in bed on arrival.  Remains difficult to rouse and stay alert.  Pt reports she is at church and has no recollection of hip surgery or fall.  Reoriented to place, time and situation.  Pt performed exercises with AAROM.  Pt performed OOB to recliner but continues to lack progression to gt due to LOA.  Continue to recommend rehab in a post acute setting.      If plan is discharge home, recommend the following: A lot of help with walking and/or transfers;A lot of help with bathing/dressing/bathroom;Assistance with cooking/housework;Assist for transportation;Help with stairs or ramp for entrance   Can travel by private vehicle     No  Equipment Recommendations  Wheelchair (measurements PT);Wheelchair cushion (measurements PT)    Recommendations for Other Services       Precautions / Restrictions Precautions Precautions: Fall Recall of Precautions/Restrictions: Intact Restrictions Weight Bearing Restrictions Per Provider Order: No RLE Weight Bearing Per Provider Order: Weight bearing as tolerated     Mobility  Bed Mobility Overal bed mobility: Needs Assistance Bed Mobility: Supine to Sit     Supine to sit: Max assist     General bed mobility comments: Max assistance to advance LEs to edge of bed and elevate trunk into a seated position.  Presents with posterior bias and very fearful of falling when moving to the edge of the bed.    Transfers Overall transfer level: Needs assistance Equipment used: None Transfers: Bed to chair/wheelchair/BSC       Squat pivot transfers: Total assist     General transfer comment: Pt remains  lethargic performed transfer from bed to recliner.    Ambulation/Gait                   Stairs             Wheelchair Mobility     Tilt Bed    Modified Rankin (Stroke Patients Only)       Balance Overall balance assessment: Needs assistance Sitting-balance support: Feet supported Sitting balance-Leahy Scale: Fair       Standing balance-Leahy Scale: Poor                              Communication Communication Communication: No apparent difficulties  Cognition Arousal: Lethargic Behavior During Therapy: Flat affect   PT - Cognitive impairments: Memory                         Following commands: Impaired      Cueing Cueing Techniques: Verbal cues  Exercises General Exercises - Lower Extremity Ankle Circles/Pumps: AROM, Both, 10 reps, Supine Short Arc Quad: AAROM, Right, 10 reps, Supine Heel Slides: AAROM, Right, 10 reps, Supine Hip ABduction/ADduction: AAROM, Right, 10 reps, Supine Straight Leg Raises: AAROM, Right, 10 reps, Supine Other Exercises Other Exercises: B HC/HS stretch 3x20 sec holds.  with gt belt.    General Comments        Pertinent Vitals/Pain Pain Assessment Pain Assessment: Faces Pain Score: 5  Pain Location: RLE with movement Pain Descriptors /  Indicators: Guarding, Grimacing Pain Intervention(s): Monitored during session, Repositioned    Home Living                          Prior Function            PT Goals (current goals can now be found in the care plan section) Acute Rehab PT Goals Patient Stated Goal: not stated Potential to Achieve Goals: Fair Progress towards PT goals: Progressing toward goals    Frequency    Min 2X/week      PT Plan      Co-evaluation              AM-PAC PT 6 Clicks Mobility   Outcome Measure  Help needed turning from your back to your side while in a flat bed without using bedrails?: A Lot Help needed moving from lying on your  back to sitting on the side of a flat bed without using bedrails?: A Lot Help needed moving to and from a bed to a chair (including a wheelchair)?: A Lot Help needed standing up from a chair using your arms (e.g., wheelchair or bedside chair)?: A Lot Help needed to walk in hospital room?: Total Help needed climbing 3-5 steps with a railing? : Total 6 Click Score: 10    End of Session Equipment Utilized During Treatment: Gait belt Activity Tolerance: Patient limited by lethargy (pain meds?) Patient left: in chair;with chair alarm set;with call bell/phone within reach Nurse Communication: Mobility status;Precautions PT Visit Diagnosis: Other abnormalities of gait and mobility (R26.89);Muscle weakness (generalized) (M62.81)     Time: 8858-8779 PT Time Calculation (min) (ACUTE ONLY): 39 min  Charges:    $Therapeutic Exercise: 23-37 mins $Therapeutic Activity: 8-22 mins PT General Charges $$ ACUTE PT VISIT: 1 Visit                     Jennifer Bailey , PTA Acute Rehabilitation Services Office [phone]    Jennifer Bailey 04/06/2024, 12:33 PM

## 2024-04-07 DIAGNOSIS — E440 Moderate protein-calorie malnutrition: Secondary | ICD-10-CM | POA: Insufficient documentation

## 2024-04-07 DIAGNOSIS — N179 Acute kidney failure, unspecified: Secondary | ICD-10-CM | POA: Diagnosis not present

## 2024-04-07 DIAGNOSIS — S72001A Fracture of unspecified part of neck of right femur, initial encounter for closed fracture: Secondary | ICD-10-CM | POA: Diagnosis not present

## 2024-04-07 DIAGNOSIS — M810 Age-related osteoporosis without current pathological fracture: Secondary | ICD-10-CM | POA: Diagnosis not present

## 2024-04-07 LAB — CBC
HCT: 26.8 % — ABNORMAL LOW (ref 36.0–46.0)
Hemoglobin: 9.1 g/dL — ABNORMAL LOW (ref 12.0–15.0)
MCH: 31.1 pg (ref 26.0–34.0)
MCHC: 34 g/dL (ref 30.0–36.0)
MCV: 91.5 fL (ref 80.0–100.0)
Platelets: 357 K/uL (ref 150–400)
RBC: 2.93 MIL/uL — ABNORMAL LOW (ref 3.87–5.11)
RDW: 14.6 % (ref 11.5–15.5)
WBC: 10.7 K/uL — ABNORMAL HIGH (ref 4.0–10.5)
nRBC: 0.2 % (ref 0.0–0.2)

## 2024-04-07 LAB — TYPE AND SCREEN
ABO/RH(D): O POS
Antibody Screen: NEGATIVE
Unit division: 0
Unit division: 0

## 2024-04-07 LAB — GLUCOSE, CAPILLARY
Glucose-Capillary: 212 mg/dL — ABNORMAL HIGH (ref 70–99)
Glucose-Capillary: 229 mg/dL — ABNORMAL HIGH (ref 70–99)
Glucose-Capillary: 341 mg/dL — ABNORMAL HIGH (ref 70–99)
Glucose-Capillary: 152 mg/dL — ABNORMAL HIGH (ref 70–99)
Glucose-Capillary: 36 mg/dL — CL (ref 70–99)
Glucose-Capillary: 32 mg/dL — CL (ref 70–99)
Glucose-Capillary: 86 mg/dL (ref 70–99)

## 2024-04-07 LAB — BPAM RBC
Blood Product Expiration Date: 202508062359
Blood Product Expiration Date: 202508062359
ISSUE DATE / TIME: 202507021246
ISSUE DATE / TIME: 202507031438
Unit Type and Rh: 5100
Unit Type and Rh: 5100

## 2024-04-07 LAB — RENAL FUNCTION PANEL
Albumin: 2.4 g/dL — ABNORMAL LOW (ref 3.5–5.0)
Anion gap: 8 (ref 5–15)
BUN: 13 mg/dL (ref 8–23)
CO2: 25 mmol/L (ref 22–32)
Calcium: 8.4 mg/dL — ABNORMAL LOW (ref 8.9–10.3)
Chloride: 100 mmol/L (ref 98–111)
Creatinine, Ser: 0.86 mg/dL (ref 0.44–1.00)
GFR, Estimated: >60 mL/min (ref >60)
Glucose, Bld: 224 mg/dL — ABNORMAL HIGH (ref 70–99)
Phosphorus: 3.3 mg/dL (ref 2.5–4.6)
Potassium: 4.5 mmol/L (ref 3.5–5.1)
Sodium: 133 mmol/L — ABNORMAL LOW (ref 135–145)

## 2024-04-07 LAB — MAGNESIUM: Magnesium: 2.1 mg/dL (ref 1.7–2.4)

## 2024-04-07 MED ORDER — APIXABAN 2.5 MG PO TABS
2.5000 mg | ORAL_TABLET | Freq: Two times a day (BID) | ORAL | Status: DC
  Administered 2024-04-07 – 2024-04-13 (×13): 2.5 mg via ORAL
  Filled 2024-04-07 (×13): qty 1

## 2024-04-07 MED ORDER — INSULIN ASPART 100 UNIT/ML IJ SOLN
3.0000 [IU] | Freq: Three times a day (TID) | INTRAMUSCULAR | Status: DC
  Administered 2024-04-07: 3 [IU] via SUBCUTANEOUS

## 2024-04-07 MED ORDER — ACETAMINOPHEN 500 MG PO TABS
1000.0000 mg | ORAL_TABLET | Freq: Four times a day (QID) | ORAL | Status: DC
  Administered 2024-04-07 – 2024-04-08 (×3): 1000 mg via ORAL
  Filled 2024-04-07 (×4): qty 2

## 2024-04-07 MED ORDER — INSULIN GLARGINE-YFGN 100 UNIT/ML ~~LOC~~ SOLN
15.0000 [IU] | Freq: Two times a day (BID) | SUBCUTANEOUS | Status: DC
  Administered 2024-04-07: 15 [IU] via SUBCUTANEOUS
  Filled 2024-04-07 (×4): qty 0.15

## 2024-04-07 NOTE — Progress Notes (Addendum)
 Occupational Therapy Treatment Patient Details Name: Jennifer Bailey MRN: [id number] DOB: 09/09/1935 Today's Date: 04/07/2024   History of present illness Pt is an 89 y/o F presenting to ED on 6/26 with R hip pain after fall, found to have R intertrochanteric hip fx s/p IMN on 6/27. PMH includes DM2, hypothyroidism, HTN, HLD   OT comments  Patient with fair progress toward patient focused goals.  Patient more alert and participatory this session.  Able to EOB with supervision for seated grooming task, stand with Mod A and take heel/toe sliding steps to the recliner at RW level.  Less pain expressed.  OT will continue efforts in the acute setting to address deficits.  Patient will benefit from continued inpatient follow up therapy, <3 hours/day.      If plan is discharge home, recommend the following:  A lot of help with walking and/or transfers;A lot of help with bathing/dressing/bathroom;Assistance with cooking/housework;Direct supervision/assist for medications management;Direct supervision/assist for financial management;Assist for transportation;Help with stairs or ramp for entrance   Equipment Recommendations       Recommendations for Other Services      Precautions / Restrictions Precautions Precautions: Fall Restrictions Weight Bearing Restrictions Per Provider Order: Yes RLE Weight Bearing Per Provider Order: Weight bearing as tolerated       Mobility Bed Mobility    Mod A                Transfers Overall transfer level: Needs assistance Equipment used: Rolling walker (2 wheels) Transfers: Sit to/from Stand, Bed to chair/wheelchair/BSC Sit to Stand: Mod assist Stand pivot transfers: Max assist, Mod assist               Balance Overall balance assessment: Needs assistance Sitting-balance support: Feet supported Sitting balance-Leahy Scale: Fair     Standing balance support: Reliant on assistive device for balance Standing balance-Leahy Scale: Poor                              ADL either performed or assessed with clinical judgement   ADL       Grooming: Supervision/safety;Sitting           Upper Body Dressing : Minimal assistance;Sitting   Lower Body Dressing: Maximal assistance;Sitting/lateral leans;Sit to/from stand   Toilet Transfer: Maximal assistance;Stand-pivot;Rolling walker (2 wheels)   Toileting- Clothing Manipulation and Hygiene: Total assistance              Extremity/Trunk Assessment Upper Extremity Assessment Upper Extremity Assessment: Overall WFL for tasks assessed   Lower Extremity Assessment Lower Extremity Assessment: Defer to PT evaluation   Cervical / Trunk Assessment Cervical / Trunk Assessment: Kyphotic    Vision Baseline Vision/History: 1 Wears glasses Patient Visual Report: No change from baseline     Perception Perception Perception: Not tested   Praxis Praxis Praxis: Not tested   Communication Communication Communication: No apparent difficulties   Cognition Arousal: Alert Behavior During Therapy: WFL for tasks assessed/performed Cognition: No family/caregiver present to determine baseline                               Following commands: Intact Following commands impaired: Only follows one step commands consistently      Cueing   Cueing Techniques: Verbal cues  Exercises      Shoulder Instructions       General Comments      Pertinent  Vitals/ Pain       Pain Assessment Pain Assessment: Faces Faces Pain Scale: Hurts little more Pain Location: RLE with movement Pain Descriptors / Indicators: Guarding, Grimacing Pain Intervention(s): Monitored during session                                                          Frequency  Min 2X/week        Progress Toward Goals  OT Goals(current goals can now be found in the care plan section)  Progress towards OT goals: Progressing toward goals  Acute Rehab OT  Goals OT Goal Formulation: With patient Time For Goal Achievement: 04/15/24 Potential to Achieve Goals: Good  Plan      Co-evaluation                 AM-PAC OT 6 Clicks Daily Activity     Outcome Measure   Help from another person eating meals?: A Little Help from another person taking care of personal grooming?: A Little Help from another person toileting, which includes using toliet, bedpan, or urinal?: A Lot Help from another person bathing (including washing, rinsing, drying)?: A Lot Help from another person to put on and taking off regular upper body clothing?: A Lot Help from another person to put on and taking off regular lower body clothing?: A Lot 6 Click Score: 14    End of Session Equipment Utilized During Treatment: Rolling walker (2 wheels)  OT Visit Diagnosis: Unsteadiness on feet (R26.81);Other abnormalities of gait and mobility (R26.89);Muscle weakness (generalized) (M62.81)   Activity Tolerance Patient tolerated treatment well   Patient Left in chair;with call bell/phone within reach;with chair alarm set   Nurse Communication Mobility status        Time: 8566-8545 OT Time Calculation (min): 21 min  Charges: OT General Charges $OT Visit: 1 Visit OT Treatments $Self Care/Home Management : 8-22 mins  04/07/2024  RP, OTR/L  Acute Rehabilitation Services  Office:  [phone]   Jennifer Bailey 04/07/2024, 3:00 PM

## 2024-04-07 NOTE — Evaluation (Signed)
 Clinical/Bedside Swallow Evaluation Patient Details  Name: Jennifer Bailey MRN: [id number] Date of Birth: 12-01-1934  Today's Date: 04/07/2024 Time: SLP Start Time (ACUTE ONLY): 1105 SLP Stop Time (ACUTE ONLY): 1129 SLP Time Calculation (min) (ACUTE ONLY): 24 min  Past Medical History:  Past Medical History:  Diagnosis Date   Counseling regarding goals of care 12/09/2020   Depression    Diabetes mellitus without complication (HCC)    Fall at home, initial encounter 12/02/2020   Family history of colon cancer- in her brother 07/09/2019   Family history of diabetes mellitus in brother 07/09/2019   Grief 12/24/2022   Hyperlipidemia associated with type 2 diabetes mellitus (HCC)    Hypertension associated with type 2 diabetes mellitus (HCC)    Memory changes    Osteoporosis    Polyneuropathy due to type 2 diabetes mellitus (HCC)    Pressure injury of skin of heel 12/09/2020   Thyroid  disease    Tibia/fibula fracture 12/02/2020   Tremulousness of voice 07/09/2019   Unsteady gait    Past Surgical History:  Past Surgical History:  Procedure Laterality Date   ABDOMINAL HYSTERECTOMY     CHOLECYSTECTOMY     COLON SURGERY     INTRAMEDULLARY (IM) NAIL INTERTROCHANTERIC Right 03/31/2024   Procedure: FIXATION, FRACTURE, INTERTROCHANTERIC, WITH INTRAMEDULLARY ROD;  Surgeon: Kendal Franky SQUIBB, MD;  Location: [facility] OR;  Service: Orthopedics;  Laterality: Right;   ORIF TIBIA PLATEAU Right 12/04/2020   Procedure: OPEN REDUCTION INTERNAL FIXATION (ORIF) TIBIAL PLATEAU;  Surgeon: Kendal Franky SQUIBB, MD;  Location: [facility] OR;  Service: Orthopedics;  Laterality: Right;   HPI:  Pt is an 89 y/o F presenting to ED on 6/26 with R hip pain after fall, found to have R intertrochanteric hip fx s/p IMN on 6/27. Swallow eval was ordered 7/4 due to concern for oral dysphagia from RN. PMH includes memory changes, tremulousness of voice, DM2, hypothyroidism, HTN, HLD    Assessment / Plan / Recommendation  Clinical  Impression  Pt was noted to have difficulty chewing this morning per nursing, with ill-fitting dentures. Upon SLP arrival, daughter was present and had brought adhesive. She cleaned and helped replace dentures which were then more securely in place. Pt has no overt signs of aspiration and oral phase appears to be functional with thin liquids and purees. Mastication is still quite a bit prolonged with bites of a cracker, although she paces herself well and used a liquid wash PRN. Daughter acknowledges that mastication is prolonged at home as well. Suspect that this is near her baseline; however, in light of possible fluctuations in different environment such as use of pain medications and eating in bed, she and pt are open to mechanical soft diet while inpatient. Will adjust diet to mechanical soft (Dys 3) with thin liquids. If pt/family prefer to go softer, nurse or MD can change to Dys 2. Will f/u briefly while admitted but do not anticipate ongoing needs post-discharge.   SLP Visit Diagnosis: Dysphagia, unspecified (R13.10)    Aspiration Risk       Diet Recommendation Dysphagia 3 (Mech soft);Thin liquid    Liquid Administration via: Cup;Straw Medication Administration: Crushed with puree Supervision: Staff to assist with self feeding Compensations: Slow rate;Small sips/bites;Follow solids with liquid Postural Changes: Seated upright at 90 degrees    Other  Recommendations Oral Care Recommendations: Oral care BID     Assistance Recommended at Discharge    Functional Status Assessment Patient has had a recent decline in their functional status  and demonstrates the ability to make significant improvements in function in a reasonable and predictable amount of time.  Frequency and Duration min 2x/week  1 week       Prognosis Prognosis for improved oropharyngeal function: Good      Swallow Study   General HPI: Pt is an 89 y/o F presenting to ED on 6/26 with R hip pain after fall, found to  have R intertrochanteric hip fx s/p IMN on 6/27. Swallow eval was ordered 7/4 due to concern for oral dysphagia from RN. PMH includes memory changes, tremulousness of voice, DM2, hypothyroidism, HTN, HLD Type of Study: Bedside Swallow Evaluation Previous Swallow Assessment: none in chart Diet Prior to this Study: Regular;Thin liquids (Level 0) Temperature Spikes Noted: No Respiratory Status: Room air History of Recent Intubation: Yes Total duration of intubation (days):  (for procedure only) Date extubated: 03/31/24 Behavior/Cognition: Alert;Cooperative;Pleasant mood Oral Cavity Assessment: Within Functional Limits Oral Care Completed by SLP: Yes Oral Cavity - Dentition: Dentures, top;Dentures, bottom Vision: Functional for self-feeding Self-Feeding Abilities: Able to feed self Patient Positioning: Upright in bed Baseline Vocal Quality: Hoarse Volitional Cough: Weak Volitional Swallow: Able to elicit    Oral/Motor/Sensory Function Overall Oral Motor/Sensory Function: Within functional limits   Ice Chips Ice chips: Not tested   Thin Liquid Thin Liquid: Within functional limits Presentation: Cup;Self Fed;Straw    Nectar Thick Nectar Thick Liquid: Not tested   Honey Thick Honey Thick Liquid: Not tested   Puree Puree: Within functional limits Presentation: Self Fed (pouch)   Solid     Solid: Impaired Presentation: Self Fed Oral Phase Impairments: Other (comment) (prolonged mastication)      Leita SAILOR., M.A. CCC-SLP Acute Rehabilitation Services Office: [phone]  Secure chat preferred  04/07/2024,12:10 PM

## 2024-04-07 NOTE — Progress Notes (Signed)
 PROGRESS NOTE  Jennifer Bailey FMW:[id number] DOB: 03/25/1935   PCP: Oris Camie BRAVO, NP  Patient is from: Home  DOA: 03/30/2024 LOS: 8  Chief complaints Chief Complaint  Patient presents with   Fall   Leg Pain     Brief Narrative / Interim history: 89 year old F with PMH of DM-2, HTN, hypothyroidism, HLD and anemia of chronic disease presented to ED with right hip pain [place].  Denies hitting her head or LOC.  She was admitted with right intertrochanteric femoral fracture.  Underwent cephalomedullary nailing of right intertrochanteric femoral fracture by Dr. Kendal on 6/27.  Hospital course complicated by postoperative ABLA and encephalopathy.  Patient's hemoglobin dropped to 6.8 on 7/2.  She was transfused 1 unit without significant improvement.  Eliquis  and aspirin  held and she was transfused additional unit on 7/3 with improvement in hemoglobin.  Encephalopathy resolved after adjusting pain medications.  Therapy recommended SNF.  Likely discharge on 7/5 if H&H stable after resuming Eliquis   Subjective: Seen and examined earlier this morning.  No major events overnight or this morning.  No complaints.  She is awake and alert and oriented to self and place but not time.  Objective: Vitals:   04/06/24 1746 04/06/24 2012 04/07/24 0516 04/07/24 0859  BP: (!) 155/59 (!) 134/43 (!) 146/64 (!) 153/46  Pulse:  82 74 72  Resp: 18 18 16 18   Temp: 98.6 F (37 C) 98.2 F (36.8 C) 97.9 F (36.6 C) 98.7 F (37.1 C)  TempSrc: Oral  Oral Oral  SpO2: 100% 96% 97% 98%  Weight:      Height:        Examination:  GENERAL: No apparent distress.  Nontoxic. HEENT: MMM.  Has dentures.  Vision and hearing grossly intact.  NECK: Supple.  No apparent JVD.  RESP:  No IWOB.  Fair aeration bilaterally. CVS:  RRR. Heart sounds normal.  ABD/GI/GU: BS+. Abd soft, NTND.  MSK/EXT: Wiggles her toes.  Difficult to move her right  leg. SKIN: Jaundice and small bruise over right thigh.  No signs of hematoma. NEURO: Awake and alert.  Oriented to self and place but not time. No apparent focal neuro deficit. PSYCH: Calm. Normal affect.   Consultants:  Orthopedic surgery  Procedures: 6/27-cephalomedullary nailing of right intertrochanteric femoral fracture by Dr. Kendal  Microbiology summarized: None  Assessment and plan: [place].  Acute right intertrochanteric fracture due to fall -S/p cephalomedullary nailing of right intertrochanteric femoral fracture by Dr. Kendal on 6/27 -Vitamin D  level 42. -WBAT on RLE per orthopedic surgery -Increase Tylenol  to 1 g 3 times daily and discontinue opiate. -Resume low-dose Eliquis  for VTE prophylaxis. -PT/OT recommended SNF  IDDM-2 with hyperglycemia: [name] 8.9%.  Recent Labs  Lab 04/06/24 1522 04/06/24 2151 04/07/24 0615 04/07/24 0857 04/07/24 1127  GLUCAP 220* 259* 212* 229* 341*  - Increase Semglee  from 15 units at bedtime to twice daily - Add NovoLog  3 units 3 times daily with meals - Continue SSI-moderate - Further adjustment as appropriate  Postoperative ABLA superimposed on anemia of chronic disease: Baseline Hgb 10-11.  Reported surgical EBL only 75 cc.  Anemia panel without deficiency.   No overt bleeding.  Surgical site is slightly tight but no external bleeding or hematoma.  Eliquis  and aspirin  held.  Transfused 2 units so far.  Hgb improved. Recent Labs  04/01/24 0824 04/02/24 1124 04/03/24 0723 04/04/24 0604 04/05/24 0542 04/05/24 1730 04/06/24 0555 04/06/24 1133 04/06/24 2314 04/07/24 0550  HGB 7.5* 7.1* 7.4* 7.1* 6.8* 7.4* 7.1* 6.9* 8.7* 9.1*  - Recheck CBC after resuming Eliquis  - Continue monitoring  Acute toxic encephalopathy: She has no focal neurodeficit other than difficulty moving RLE from surgery.  Encephalopathy likely due to opiate.  Improved after  holding opiates. -Tylenol  1 g 3 times daily -Reorientation and delirium precaution -Minimize avoid sedating medications   Essential hypertension: Normotensive for most part -Continue home losartan   Acute kidney injury: Resolved    Mild hyponatremia: Stable -Continue monitoring  Hypothyroidism -Continue Synthroid .    Pseudohyponatremia: Due to hyperglycemia   Physical debilitation muscle weakness -PT/OT recommended SNF.  Moderate malnutrition Body mass index is 26.96 kg/m. Nutrition Problem: Moderate Malnutrition Etiology: social / environmental circumstances (decline in functional status) Signs/Symptoms: moderate muscle depletion, mild fat depletion Interventions: Glucerna shake, Prostat, MVI  DVT prophylaxis:  apixaban  (ELIQUIS ) tablet 2.5 mg Start: 04/07/24 1345 Place and maintain sequential compression device Start: 04/06/24 1239 SCDs Start: 03/30/24 2300 apixaban  (ELIQUIS ) tablet 2.5 mg  Code Status: Full code Family Communication: None at bedside Level of care: Med-Surg Status is: Inpatient Remains inpatient appropriate because: Right hip fracture, ABLA requiring blood transfusion, encephalopathy   Final disposition: SNF   35 minutes with more than 50% spent in reviewing records, counseling patient/family and coordinating care.   Sch Meds:  Scheduled Meds:  (feeding supplement) PROSource Plus  30 mL Oral BID BM   acetaminophen   1,000 mg Oral Q6H WA   apixaban   2.5 mg Oral BID   brimonidine   1 drop Left Eye BID   cholecalciferol   1,000 Units Oral Daily   dorzolamide -timolol   1 drop Left Eye BID   Fe Fum-Vit C-Vit B12-FA  1 capsule Oral BID   feeding supplement (GLUCERNA SHAKE)  237 mL Oral TID BM   insulin  aspart  0-15 Units Subcutaneous TID WC   insulin  glargine-yfgn  15 Units Subcutaneous QHS   levothyroxine   100 mcg Oral Q0600   losartan   50 mg Oral Daily   multivitamin with minerals  1 tablet Oral Daily   pravastatin   40 mg Oral q1800    Continuous Infusions: PRN Meds:.hydrALAZINE , ondansetron  (ZOFRAN ) IV, mouth rinse, traZODone   Antimicrobials: Anti-infectives (From admission, onward)    Start     Dose/Rate Route Frequency Ordered Stop   03/31/24 1915  ceFAZolin  (ANCEF ) IVPB 2g/100 mL premix        2 g 200 mL/hr over 30 Minutes Intravenous Every 8 hours 03/31/24 1333 04/01/24 1403   03/31/24 1032  ceFAZolin  (ANCEF ) 2-4 GM/100ML-% IVPB       Note to Pharmacy: Ezequiel Henri: cabinet override      03/31/24 1032 03/31/24 1127   03/31/24 1030  ceFAZolin  (ANCEF ) IVPB 2g/100 mL premix        2 g 200 mL/hr over 30 Minutes Intravenous On call to O.R. 03/31/24 1029 03/31/24 1123        I have personally reviewed the following labs and images: CBC: Recent Labs  Lab 04/04/24 0604 04/05/24 0542 04/05/24 1730 04/06/24 0555 04/06/24 1133 04/06/24 2314 04/07/24 0550  WBC 10.7* 10.8*  --  12.8* 12.6*  --  10.7*  HGB 7.1* 6.8* 7.4* 7.1* 6.9* 8.7* 9.1*  HCT 21.2* 20.5* 22.0* 21.7* 20.7* 25.8* 26.8*  MCV 91.4 91.5  --  93.1 92.8  --  91.5  PLT 265 305  --  371 357  --  357   BMP &GFR Recent Labs  Lab 04/01/24 0824 04/02/24 1124 04/03/24 0723 04/04/24 0604 04/05/24 0542 04/06/24 0555 04/07/24 0550  NA 136 132* 133* 134* 132* 133* 133*  K 4.2 4.1 3.7 4.7 4.7 4.4 4.5  CL 102 97* 100 101 99 100 100  CO2 22 23 23 24 24 24 25   GLUCOSE 182* 218* 138* 313* 247* 205* 224*  BUN 13 18 16 15 13 14 13   CREATININE 1.32* 1.06* 1.02* 0.93 0.82 0.90 0.86  CALCIUM 8.8* 8.3* 8.4* 8.3* 7.9* 8.5* 8.4*  MG 1.9 2.1  --   --   --   --  2.1  PHOS  --   --   --   --   --   --  3.3   Estimated Creatinine Clearance: 35.5 mL/min (by C-G formula based on SCr of 0.86 mg/dL). Liver & Pancreas: Recent Labs  Lab 04/07/24 0550  ALBUMIN  2.4*   No results for input(s): LIPASE, AMYLASE in the last 168 hours. No results for input(s): AMMONIA in the last 168 hours. Diabetic: No results for input(s): HGBA1C in the last 72  hours. Recent Labs  Lab 04/06/24 1522 04/06/24 2151 04/07/24 0615 04/07/24 0857 04/07/24 1127  GLUCAP 220* 259* 212* 229* 341*   Cardiac Enzymes: No results for input(s): CKTOTAL, CKMB, CKMBINDEX, TROPONINI in the last 168 hours. No results for input(s): PROBNP in the last 8760 hours. Coagulation Profile: No results for input(s): INR, PROTIME in the last 168 hours. Thyroid  Function Tests: No results for input(s): TSH, T4TOTAL, FREET4, T3FREE, THYROIDAB in the last 72 hours. Lipid Profile: No results for input(s): CHOL, HDL, LDLCALC, TRIG, CHOLHDL, LDLDIRECT in the last 72 hours. Anemia Panel: Recent Labs    04/05/24 1014  VITAMINB12 505  FOLATE 13.6  FERRITIN 138  TIBC 224*  IRON 50  RETICCTPCT 4.9*   Urine analysis:    Component Value Date/Time   COLORURINE YELLOW 05/18/2022 1338   APPEARANCEUR CLEAR 05/18/2022 1338   LABSPEC 1.010 05/18/2022 1338   PHURINE 6.5 05/18/2022 1338   GLUCOSEU NEGATIVE 05/18/2022 1338   HGBUR NEGATIVE 05/18/2022 1338   BILIRUBINUR NEGATIVE 05/18/2022 1338   KETONESUR NEGATIVE 05/18/2022 1338   PROTEINUR NEGATIVE 04/29/2018 2300   UROBILINOGEN 0.2 05/18/2022 1338   NITRITE NEGATIVE 05/18/2022 1338   LEUKOCYTESUR TRACE (A) 05/18/2022 1338   Sepsis Labs: Invalid input(s): PROCALCITONIN, LACTICIDVEN  Microbiology: Recent Results (from the past 240 hours)  Surgical pcr screen     Status: None   Collection Time: 03/31/24 10:30 AM   Specimen: Nasal Mucosa; Nasal Swab  Result Value Ref Range Status   MRSA, PCR NEGATIVE NEGATIVE Final   Staphylococcus aureus NEGATIVE NEGATIVE Final    Comment: (NOTE) The Xpert SA Assay (FDA approved for NASAL specimens in patients 53 years of age and older), is one component of a comprehensive surveillance program. It is not intended to diagnose infection nor to guide or monitor treatment. Performed at [facility], [address], [place],  [place] [address]     Radiology Studies: No results found.    Sussan Meter T. Vandella Ord [facility]  If 7PM-7AM, please contact night-coverage [email or web address] 04/07/2024, 12:51 PM

## 2024-04-07 NOTE — TOC Progression Note (Signed)
 Transition of Care [facility]) - Progression Note    Patient Details  Name: Jennifer Bailey MRN: [id number] Date of Birth: 03-03-1935  Transition [facility]) CM/SW Contact  Bridget Cordella Simmonds, LCSW Phone Number: 04/07/2024, 11:08 AM  Clinical Narrative:   Per MD, likely stable for DC tomorrow.  CSW confirmed with [facility] that they can receive pt tomorrow.  Auth dates 7/2-7/4.  Auth still good through 7/5.      Expected Discharge Plan: Skilled Nursing Facility Barriers to Discharge: Continued Medical Work up, SNF Pending bed offer, Insurance Authorization  Expected Discharge Plan and Services       Living arrangements for the past 2 months: Single Family Home                                       Social Determinants of Health (SDOH) Interventions SDOH Screenings   Food Insecurity: No Food Insecurity (03/31/2024)  Housing: Low Risk  (03/31/2024)  Transportation Needs: No Transportation Needs (03/31/2024)  Utilities: Not At Risk (03/31/2024)  Depression (PHQ2-9): Low Risk  (02/07/2024)  Financial Resource Strain: Low Risk  (02/16/2023)  Physical Activity: Sufficiently Active (02/16/2023)  Social Connections: Moderately Isolated (03/31/2024)  Stress: No Stress Concern Present (02/16/2023)  Tobacco Use: Low Risk  (03/31/2024)    Readmission Risk Interventions     No data to display

## 2024-04-08 DIAGNOSIS — E440 Moderate protein-calorie malnutrition: Secondary | ICD-10-CM | POA: Diagnosis not present

## 2024-04-08 DIAGNOSIS — N179 Acute kidney failure, unspecified: Secondary | ICD-10-CM | POA: Diagnosis not present

## 2024-04-08 DIAGNOSIS — S72001A Fracture of unspecified part of neck of right femur, initial encounter for closed fracture: Secondary | ICD-10-CM | POA: Diagnosis not present

## 2024-04-08 DIAGNOSIS — M810 Age-related osteoporosis without current pathological fracture: Secondary | ICD-10-CM | POA: Diagnosis not present

## 2024-04-08 LAB — RENAL FUNCTION PANEL
Albumin: 2.8 g/dL — ABNORMAL LOW (ref 3.5–5.0)
Albumin: 2.5 g/dL — ABNORMAL LOW (ref 3.5–5.0)
Anion gap: 10 (ref 5–15)
Anion gap: 7 (ref 5–15)
BUN: 22 mg/dL (ref 8–23)
BUN: 23 mg/dL (ref 8–23)
CO2: 21 mmol/L — ABNORMAL LOW (ref 22–32)
CO2: 22 mmol/L (ref 22–32)
Calcium: 8.7 mg/dL — ABNORMAL LOW (ref 8.9–10.3)
Calcium: 8.4 mg/dL — ABNORMAL LOW (ref 8.9–10.3)
Chloride: 97 mmol/L — ABNORMAL LOW (ref 98–111)
Chloride: 98 mmol/L (ref 98–111)
Creatinine, Ser: 0.96 mg/dL (ref 0.44–1.00)
Creatinine, Ser: 0.98 mg/dL (ref 0.44–1.00)
GFR, Estimated: 58 mL/min — ABNORMAL LOW (ref >60)
GFR, Estimated: 56 mL/min — ABNORMAL LOW (ref >60)
Glucose, Bld: 296 mg/dL — ABNORMAL HIGH (ref 70–99)
Glucose, Bld: 307 mg/dL — ABNORMAL HIGH (ref 70–99)
Phosphorus: 3.9 mg/dL (ref 2.5–4.6)
Phosphorus: 2.9 mg/dL (ref 2.5–4.6)
Potassium: 5.3 mmol/L — ABNORMAL HIGH (ref 3.5–5.1)
Potassium: 4.3 mmol/L (ref 3.5–5.1)
Sodium: 128 mmol/L — ABNORMAL LOW (ref 135–145)
Sodium: 127 mmol/L — ABNORMAL LOW (ref 135–145)

## 2024-04-08 LAB — GLUCOSE, CAPILLARY
Glucose-Capillary: 170 mg/dL — ABNORMAL HIGH (ref 70–99)
Glucose-Capillary: 306 mg/dL — ABNORMAL HIGH (ref 70–99)
Glucose-Capillary: 439 mg/dL — ABNORMAL HIGH (ref 70–99)
Glucose-Capillary: 353 mg/dL — ABNORMAL HIGH (ref 70–99)
Glucose-Capillary: 71 mg/dL (ref 70–99)
Glucose-Capillary: 130 mg/dL — ABNORMAL HIGH (ref 70–99)

## 2024-04-08 LAB — CBC
HCT: 32.1 % — ABNORMAL LOW (ref 36.0–46.0)
Hemoglobin: 10.8 g/dL — ABNORMAL LOW (ref 12.0–15.0)
MCH: 31.9 pg (ref 26.0–34.0)
MCHC: 33.6 g/dL (ref 30.0–36.0)
MCV: 94.7 fL (ref 80.0–100.0)
Platelets: 427 K/uL — ABNORMAL HIGH (ref 150–400)
RBC: 3.39 MIL/uL — ABNORMAL LOW (ref 3.87–5.11)
RDW: 14.9 % (ref 11.5–15.5)
WBC: 13.2 K/uL — ABNORMAL HIGH (ref 4.0–10.5)
nRBC: 0 % (ref 0.0–0.2)

## 2024-04-08 LAB — MAGNESIUM: Magnesium: 2.3 mg/dL (ref 1.7–2.4)

## 2024-04-08 MED ORDER — INSULIN GLARGINE-YFGN 100 UNIT/ML ~~LOC~~ SOLN
20.0000 [IU] | Freq: Every day | SUBCUTANEOUS | Status: DC
  Administered 2024-04-09 – 2024-04-13 (×5): 20 [IU] via SUBCUTANEOUS
  Filled 2024-04-08 (×5): qty 0.2

## 2024-04-08 MED ORDER — OXYCODONE HCL 5 MG PO TABS
2.5000 mg | ORAL_TABLET | Freq: Three times a day (TID) | ORAL | Status: DC | PRN
  Administered 2024-04-08 – 2024-04-13 (×6): 2.5 mg via ORAL
  Filled 2024-04-08 (×6): qty 1

## 2024-04-08 MED ORDER — INSULIN ASPART 100 UNIT/ML IJ SOLN
3.0000 [IU] | Freq: Three times a day (TID) | INTRAMUSCULAR | Status: DC
  Administered 2024-04-09 – 2024-04-12 (×8): 3 [IU] via SUBCUTANEOUS

## 2024-04-08 MED ORDER — ACETAMINOPHEN 325 MG PO TABS
650.0000 mg | ORAL_TABLET | Freq: Four times a day (QID) | ORAL | Status: DC
  Administered 2024-04-08 – 2024-04-13 (×17): 650 mg via ORAL
  Filled 2024-04-08 (×19): qty 2

## 2024-04-08 MED ORDER — AMLODIPINE BESYLATE 5 MG PO TABS
5.0000 mg | ORAL_TABLET | Freq: Every day | ORAL | Status: DC
  Administered 2024-04-08 – 2024-04-13 (×6): 5 mg via ORAL
  Filled 2024-04-08 (×6): qty 1

## 2024-04-08 MED ORDER — OXYCODONE HCL 5 MG PO TABS
5.0000 mg | ORAL_TABLET | Freq: Three times a day (TID) | ORAL | Status: DC | PRN
  Filled 2024-04-08: qty 1

## 2024-04-08 MED ORDER — INSULIN GLARGINE-YFGN 100 UNIT/ML ~~LOC~~ SOLN
15.0000 [IU] | Freq: Every day | SUBCUTANEOUS | Status: DC
  Administered 2024-04-08: 15 [IU] via SUBCUTANEOUS
  Filled 2024-04-08: qty 0.15

## 2024-04-08 MED ORDER — INSULIN ASPART 100 UNIT/ML IJ SOLN: 5.0000 [IU] | Freq: Three times a day (TID) | INTRAMUSCULAR | Status: DC

## 2024-04-08 NOTE — Progress Notes (Signed)
 PROGRESS NOTE  Jennifer Bailey FMW:[id number] DOB: [birth year]   PCP: Oris Camie BRAVO, NP  Patient is from: Home  DOA: 03/30/2024 LOS: 9  Chief complaints Chief Complaint  Patient presents with   Fall   Leg Pain     Brief Narrative / Interim history: 89 year old F with PMH of DM-2, HTN, hypothyroidism, HLD and anemia of chronic disease presented to ED with right hip pain [place].  Denies hitting her head or LOC.  She was admitted with right intertrochanteric femoral fracture.  Underwent cephalomedullary nailing of right intertrochanteric femoral fracture by Dr. Kendal on 6/27.  Hospital course complicated by postoperative ABLA and encephalopathy.  Patient's hemoglobin dropped to 6.8 on 7/2.  She was transfused 1 unit without significant improvement.  Eliquis  and aspirin  held and she was transfused additional unit on 7/3 with improvement in hemoglobin.  Encephalopathy resolved after adjusting pain medications.  Therapy recommended SNF.  Likely discharge once glycemic control and electrolyte improved.  Subjective: Seen and examined earlier this morning.  Hypoglycemia 232 about 9 PM last night.  CBG as high as 440 this morning but postprandial although charted as fasting.  Reports some pain in her right hip.  No other complaints  Objective: Vitals:   04/07/24 0859 04/07/24 1615 04/07/24 2131 04/08/24 0530  BP: (!) 153/46 (!) 118/44 (!) 160/60 (!) 144/74  Pulse: 72 71 78   Resp: 18 18 17 18   Temp: 98.7 F (37.1 C) 97.7 F (36.5 C) 98 F (36.7 C) 97.9 F (36.6 C)  TempSrc: Oral   Oral  SpO2: 98% 98% 99% 98%  Weight:      Height:        Examination:  GENERAL: No apparent distress.  Nontoxic. HEENT: MMM.  Has dentures.  Vision and hearing grossly intact.  NECK: Supple.  No apparent JVD.  RESP:  No IWOB.  Fair aeration bilaterally. CVS:  RRR. Heart sounds normal.  ABD/GI/GU: BS+. Abd soft, NTND.  MSK/EXT:  Wiggles her toes.  Difficult to move her right leg. SKIN: Jaundice and small bruise over right thigh.  No signs of hematoma. NEURO: Awake and alert.  Oriented to self and place but not time. No apparent focal neuro deficit. PSYCH: Calm. Normal affect.   Consultants:  Orthopedic surgery  Procedures: 6/27-cephalomedullary nailing of right intertrochanteric femoral fracture by Dr. Kendal  Microbiology summarized: None  Assessment and plan: [place].  Acute right intertrochanteric fracture due to fall -S/p cephalomedullary nailing of right intertrochanteric femoral fracture by Dr. Kendal on 6/27 -Vitamin D  level 42. -WBAT on RLE per orthopedic surgery -Increase Tylenol  to 1 g 3 times daily and discontinue opiate. -Resume low-dose Eliquis  for VTE prophylaxis. -PT/OT recommended SNF  IDDM-2 with hyperglycemia and level 2 hypoglycemia: [name] 8.9%.  CBG is low at 32 last night.  Morning CBG elevated to 440 but postprandial.  Patient did not receive coverage early in the morning when his CBG was up to 306. Recent Labs  Lab 04/07/24 2214 04/08/24 0112 04/08/24 0651 04/08/24 0905 04/08/24 1127  GLUCAP 86 170* 306* 439* 353*  -Decrease Semglee  from 15 units twice daily to 15 units daily -Continue SSI-moderate -NovoLog  3 units 3 times daily with meals starting this evening -Further adjustment as appropriate  Postoperative ABLA superimposed on anemia of chronic disease: Baseline Hgb 10-11.  Reported surgical EBL only  75 cc.  Anemia panel without deficiency.   No overt bleeding.  Surgical site is slightly tight but no external bleeding or hematoma.   Transfused 2 units so far.  Low-dose Eliquis  resumed on 7/4.  Hgb improved and stable. Recent Labs    04/02/24 1124 04/03/24 0723 04/04/24 0604 04/05/24 0542 04/05/24 1730 04/06/24 0555 04/06/24 1133 04/06/24 2314 04/07/24 0550 04/08/24 0429  HGB 7.1* 7.4*  7.1* 6.8* 7.4* 7.1* 6.9* 8.7* 9.1* 10.8*  - Monitor as needed  Acute toxic encephalopathy: She has no focal neurodeficit other than difficulty moving RLE from surgery.  Encephalopathy likely due to opiate.  Improved after holding opiates. -Tylenol  650 mg every 6 hours while awake -Decreased oxycodone  to 2.5 mg every 8 hours as needed -Reorientation and delirium precaution -Minimize avoid sedating medications   Essential hypertension: Normotensive for most part -Continue home losartan   Acute kidney injury: Resolved    Mild hyponatremia: Probably due to hyperglycemia. -Continue monitoring  Hyperkalemia - P.o. Lokelma 10 g x 1 - Recheck in the morning  Hypothyroidism -Continue Synthroid .    Leukocytosis: Demargination?  No signs of infection. -Recheck in the morning   Physical debilitation muscle weakness -PT/OT recommended SNF.  Moderate malnutrition Body mass index is 26.96 kg/m. Nutrition Problem: Moderate Malnutrition Etiology: social / environmental circumstances (decline in functional status) Signs/Symptoms: moderate muscle depletion, mild fat depletion Interventions: Glucerna shake, Prostat, MVI  DVT prophylaxis:  apixaban  (ELIQUIS ) tablet 2.5 mg Start: 04/07/24 1345 Place and maintain sequential compression device Start: 04/06/24 1239 SCDs Start: 03/30/24 2300 apixaban  (ELIQUIS ) tablet 2.5 mg  Code Status: Full code Family Communication: None at bedside Level of care: Med-Surg Status is: Inpatient Remains inpatient appropriate because: Right hip fracture, hyperglycemia and electrolyte abnormality   Final disposition: SNF   35 minutes with more than 50% spent in reviewing records, counseling patient/family and coordinating care.   Sch Meds:  Scheduled Meds:  (feeding supplement) PROSource Plus  30 mL Oral BID BM   acetaminophen   650 mg Oral Q6H WA   amLODipine   5 mg Oral Daily   apixaban   2.5 mg Oral BID   brimonidine   1 drop Left Eye BID    cholecalciferol   1,000 Units Oral Daily   dorzolamide -timolol   1 drop Left Eye BID   Fe Fum-Vit C-Vit B12-FA  1 capsule Oral BID   feeding supplement (GLUCERNA SHAKE)  237 mL Oral TID BM   insulin  aspart  0-15 Units Subcutaneous TID WC   [START ON 04/09/2024] insulin  glargine-yfgn  20 Units Subcutaneous Daily   levothyroxine   100 mcg Oral Q0600   multivitamin with minerals  1 tablet Oral Daily   pravastatin   40 mg Oral q1800   Continuous Infusions: PRN Meds:.hydrALAZINE , ondansetron  (ZOFRAN ) IV, mouth rinse, oxyCODONE , traZODone   Antimicrobials: Anti-infectives (From admission, onward)    Start     Dose/Rate Route Frequency Ordered Stop   03/31/24 1915  ceFAZolin  (ANCEF ) IVPB 2g/100 mL premix        2 g 200 mL/hr over 30 Minutes Intravenous Every 8 hours 03/31/24 1333 04/01/24 1403   03/31/24 1032  ceFAZolin  (ANCEF ) 2-4 GM/100ML-% IVPB       Note to Pharmacy: Ezequiel Henri: cabinet override      03/31/24 1032 03/31/24 1127   03/31/24 1030  ceFAZolin  (ANCEF ) IVPB 2g/100 mL premix        2 g 200 mL/hr over 30 Minutes Intravenous On call to O.R. 03/31/24 1029 03/31/24 1123        I  have personally reviewed the following labs and images: CBC: Recent Labs  Lab 04/05/24 0542 04/05/24 1730 04/06/24 0555 04/06/24 1133 04/06/24 2314 04/07/24 0550 04/08/24 0429  WBC 10.8*  --  12.8* 12.6*  --  10.7* 13.2*  HGB 6.8*   < > 7.1* 6.9* 8.7* 9.1* 10.8*  HCT 20.5*   < > 21.7* 20.7* 25.8* 26.8* 32.1*  MCV 91.5  --  93.1 92.8  --  91.5 94.7  PLT 305  --  371 357  --  357 427*   < > = values in this interval not displayed.   BMP &GFR Recent Labs  Lab 04/02/24 1124 04/03/24 0723 04/04/24 0604 04/05/24 0542 04/06/24 0555 04/07/24 0550 04/08/24 0429  NA 132*   < > 134* 132* 133* 133* 128*  K 4.1   < > 4.7 4.7 4.4 4.5 5.3*  CL 97*   < > 101 99 100 100 97*  CO2 23   < > 24 24 24 25  21*  GLUCOSE 218*   < > 313* 247* 205* 224* 296*  BUN 18   < > 15 13 14 13 22   CREATININE  1.06*   < > 0.93 0.82 0.90 0.86 0.96  CALCIUM 8.3*   < > 8.3* 7.9* 8.5* 8.4* 8.7*  MG 2.1  --   --   --   --  2.1 2.3  PHOS  --   --   --   --   --  3.3 3.9   < > = values in this interval not displayed.   Estimated Creatinine Clearance: 31.8 mL/min (by C-G formula based on SCr of 0.96 mg/dL). Liver & Pancreas: Recent Labs  Lab 04/07/24 0550 04/08/24 0429  ALBUMIN  2.4* 2.8*   No results for input(s): LIPASE, AMYLASE in the last 168 hours. No results for input(s): AMMONIA in the last 168 hours. Diabetic: No results for input(s): HGBA1C in the last 72 hours. Recent Labs  Lab 04/07/24 2214 04/08/24 0112 04/08/24 0651 04/08/24 0905 04/08/24 1127  GLUCAP 86 170* 306* 439* 353*   Cardiac Enzymes: No results for input(s): CKTOTAL, CKMB, CKMBINDEX, TROPONINI in the last 168 hours. No results for input(s): PROBNP in the last 8760 hours. Coagulation Profile: No results for input(s): INR, PROTIME in the last 168 hours. Thyroid  Function Tests: No results for input(s): TSH, T4TOTAL, FREET4, T3FREE, THYROIDAB in the last 72 hours. Lipid Profile: No results for input(s): CHOL, HDL, LDLCALC, TRIG, CHOLHDL, LDLDIRECT in the last 72 hours. Anemia Panel: No results for input(s): VITAMINB12, FOLATE, FERRITIN, TIBC, IRON, RETICCTPCT in the last 72 hours.  Urine analysis:    Component Value Date/Time   COLORURINE YELLOW 05/18/2022 1338   APPEARANCEUR CLEAR 05/18/2022 1338   LABSPEC 1.010 05/18/2022 1338   PHURINE 6.5 05/18/2022 1338   GLUCOSEU NEGATIVE 05/18/2022 1338   HGBUR NEGATIVE 05/18/2022 1338   BILIRUBINUR NEGATIVE 05/18/2022 1338   KETONESUR NEGATIVE 05/18/2022 1338   PROTEINUR NEGATIVE 04/29/2018 2300   UROBILINOGEN 0.2 05/18/2022 1338   NITRITE NEGATIVE 05/18/2022 1338   LEUKOCYTESUR TRACE (A) 05/18/2022 1338   Sepsis Labs: Invalid input(s): PROCALCITONIN, LACTICIDVEN  Microbiology: Recent Results (from the  past 240 hours)  Surgical pcr screen     Status: None   Collection Time: 03/31/24 10:30 AM   Specimen: Nasal Mucosa; Nasal Swab  Result Value Ref Range Status   MRSA, PCR NEGATIVE NEGATIVE Final   Staphylococcus aureus NEGATIVE NEGATIVE Final    Comment: (NOTE) The Xpert SA Assay (FDA approved for  NASAL specimens in patients 61 years of age and older), is one component of a comprehensive surveillance program. It is not intended to diagnose infection nor to guide or monitor treatment. Performed at [facility], [address] [place], [place] [address]     Radiology Studies: No results found.    Yandriel Boening T. Tilda Samudio [facility]  If 7PM-7AM, please contact night-coverage [email or web address] 04/08/2024, 1:21 PM

## 2024-04-08 NOTE — TOC Progression Note (Signed)
 Transition of Care [facility]) - Progression Note    Patient Details  Name: Jennifer Bailey MRN: [id number] Date of Birth: 1934/12/23  Transition of Care [facility]) CM/SW Contact  Dino CHRISTELLA Au, LCSWA Phone Number: 04/08/2024, 1:14 PM  Clinical Narrative:     Per MD, pt not medically ready today.  Pts auth expired 7/4 and the 24 hour grace period ends at midnight tonight. Pt will need new auth started tomorrow.   SW updated [facility] [phone])   Expected Discharge Plan: Skilled Nursing Facility Barriers to Discharge: Continued Medical Work up, SNF Pending bed offer, Insurance Authorization  Expected Discharge Plan and Services       Living arrangements for the past 2 months: Single Family Home                                       Social Determinants of Health (SDOH) Interventions SDOH Screenings   Food Insecurity: No Food Insecurity (03/31/2024)  Housing: Low Risk  (03/31/2024)  Transportation Needs: No Transportation Needs (03/31/2024)  Utilities: Not At Risk (03/31/2024)  Depression (PHQ2-9): Low Risk  (02/07/2024)  Financial Resource Strain: Low Risk  (02/16/2023)  Physical Activity: Sufficiently Active (02/16/2023)  Social Connections: Moderately Isolated (03/31/2024)  Stress: No Stress Concern Present (02/16/2023)  Tobacco Use: Low Risk  (03/31/2024)    Readmission Risk Interventions     No data to display

## 2024-04-08 NOTE — Progress Notes (Signed)
 Physical Therapy Treatment Patient Details Name: Jennifer Bailey MRN: [id number] DOB: September 13, 1935 Today's Date: 04/08/2024   History of Present Illness Pt is an 89 y/o F presenting to ED on 6/26 with R hip pain after fall, found to have R intertrochanteric hip fx s/p IMN on 6/27. PMH includes DM2, hypothyroidism, HTN, HLD    PT Comments  Pt greeted supine in bed, pleasant and agreeable to PT session. She is making steady progress towards her acute PT goals demonstrated by advanced functional mobility with decreased physical assistance. Pt largely required minA x2. She ambulated ~59ft within the room and demonstrated multiple gait abnormalities. Pt's R foot is resting in PF and inversion, she was unable to demonstrate any active DF while seated in chair. Recommend prevalon boot for positioning, RN aware. Will continue to follow acutely and advance appropriately.     If plan is discharge home, recommend the following: A lot of help with walking and/or transfers;A lot of help with bathing/dressing/bathroom;Assistance with cooking/housework;Assist for transportation;Help with stairs or ramp for entrance   Can travel by private vehicle     No  Equipment Recommendations  Wheelchair (measurements PT);Wheelchair cushion (measurements PT)    Recommendations for Other Services       Precautions / Restrictions Precautions Precautions: Fall Recall of Precautions/Restrictions: Intact Restrictions Weight Bearing Restrictions Per Provider Order: Yes RLE Weight Bearing Per Provider Order: Weight bearing as tolerated     Mobility  Bed Mobility Overal bed mobility: Needs Assistance Bed Mobility: Rolling, Sidelying to Sit Rolling: Min assist, Mod assist, Used rails Sidelying to sit: Min assist, Used rails       General bed mobility comments: Pt found soiled. Rolled to L with modA and maintained sidelying with min support while pericare was addressed. Pt sat up on L side of bed with assist to bring  BLE off EOB, use of bedpad to faciliate L sidelying, assist to elevate trunk, and use of bedpad to scoot pt fwd til feet flat.    Transfers Overall transfer level: Needs assistance Equipment used: Rolling walker (2 wheels) Transfers: Sit to/from Stand Sit to Stand: Min assist, +2 physical assistance, From elevated surface           General transfer comment: Pt stood from raised bed height. Cues for proper hand placement using RW. Faciliated hip tuck with post-to-ant feedback on hip. Pt took increased time to maintain upright posture. Good eccentric control. Scooted back in chair with use of bed pad and +2 assist.    Ambulation/Gait Ambulation/Gait assistance: Min assist, +2 safety/equipment (chair follow) Gait Distance (Feet): 5 Feet Assistive device: Rolling walker (2 wheels) Gait Pattern/deviations: Step-to pattern, Decreased step length - right, Decreased step length - left, Antalgic, Decreased stance time - right, Decreased weight shift to right, Decreased dorsiflexion - right, Trunk flexed       General Gait Details: Pt ambulated with short slow steps. She self-limited WBing on RLE. Cues to allow foot flat contact. Pt lacked RLE DF and ankle appeared to be maintained in inversion. Fwd flex posture over RW. Cues to come upright and increase WBing into BUE support on RW in order to advance LE.   Stairs             Wheelchair Mobility     Tilt Bed    Modified Rankin (Stroke Patients Only)       Balance Overall balance assessment: Needs assistance Sitting-balance support: Feet supported Sitting balance-Leahy Scale: Fair     Standing balance support:  Reliant on assistive device for balance, Bilateral upper extremity supported, During functional activity Standing balance-Leahy Scale: Poor                              Communication Communication Communication: No apparent difficulties  Cognition Arousal: Alert Behavior During Therapy: WFL for tasks  assessed/performed   PT - Cognitive impairments: Sequencing, Initiation, Safety/Judgement                       PT - Cognition Comments: Pt with delayed processing. Took increased time to complete all tasks and required moderate cues. Following commands: Intact      Cueing Cueing Techniques: Verbal cues  Exercises      General Comments        Pertinent Vitals/Pain Pain Assessment Pain Assessment: Faces Faces Pain Scale: Hurts even more Pain Location: RLE with movement Pain Descriptors / Indicators: Guarding, Grimacing, Moaning, Aching, Discomfort Pain Intervention(s): Monitored during session, Limited activity within patient's tolerance, Repositioned    Home Living                          Prior Function            PT Goals (current goals can now be found in the care plan section) Acute Rehab PT Goals Patient Stated Goal: Have less pain and move easier Progress towards PT goals: Progressing toward goals    Frequency    Min 2X/week      PT Plan      Co-evaluation              AM-PAC PT 6 Clicks Mobility   Outcome Measure  Help needed turning from your back to your side while in a flat bed without using bedrails?: A Lot Help needed moving from lying on your back to sitting on the side of a flat bed without using bedrails?: A Little Help needed moving to and from a bed to a chair (including a wheelchair)?: Total Help needed standing up from a chair using your arms (e.g., wheelchair or bedside chair)?: Total Help needed to walk in hospital room?: Total Help needed climbing 3-5 steps with a railing? : Total 6 Click Score: 9    End of Session Equipment Utilized During Treatment: Gait belt Activity Tolerance: Patient tolerated treatment well;Patient limited by fatigue Patient left: in chair;with call bell/phone within reach;with chair alarm set Nurse Communication: Mobility status;Other (comment) (pt need for PRAFOs for RLE foot  position. Pt had BM and was cleaned, purewick and sacral pad disposed of since they were soiled.) PT Visit Diagnosis: Other abnormalities of gait and mobility (R26.89);Muscle weakness (generalized) (M62.81)     Time: 1010-1041 PT Time Calculation (min) (ACUTE ONLY): 31 min  Charges:    $Gait Training: 8-22 mins $Therapeutic Activity: 8-22 mins PT General Charges $$ ACUTE PT VISIT: 1 Visit                     Randall SAUNDERS, PT, DPT Acute Rehabilitation Services Office: [phone] Secure Chat Preferred  Delon CHRISTELLA Callander 04/08/2024, 11:43 AM

## 2024-04-08 NOTE — Plan of Care (Signed)
  Problem: Education: Goal: Individualized Educational Video(s) Outcome: Progressing   

## 2024-04-09 DIAGNOSIS — E440 Moderate protein-calorie malnutrition: Secondary | ICD-10-CM | POA: Diagnosis not present

## 2024-04-09 DIAGNOSIS — S72001A Fracture of unspecified part of neck of right femur, initial encounter for closed fracture: Secondary | ICD-10-CM | POA: Diagnosis not present

## 2024-04-09 DIAGNOSIS — M810 Age-related osteoporosis without current pathological fracture: Secondary | ICD-10-CM | POA: Diagnosis not present

## 2024-04-09 DIAGNOSIS — N179 Acute kidney failure, unspecified: Secondary | ICD-10-CM | POA: Diagnosis not present

## 2024-04-09 LAB — GLUCOSE, CAPILLARY
Glucose-Capillary: 176 mg/dL — ABNORMAL HIGH (ref 70–99)
Glucose-Capillary: 229 mg/dL — ABNORMAL HIGH (ref 70–99)
Glucose-Capillary: 165 mg/dL — ABNORMAL HIGH (ref 70–99)
Glucose-Capillary: 108 mg/dL — ABNORMAL HIGH (ref 70–99)

## 2024-04-09 LAB — CBC
HCT: 27.6 % — ABNORMAL LOW (ref 36.0–46.0)
Hemoglobin: 9.4 g/dL — ABNORMAL LOW (ref 12.0–15.0)
MCH: 32 pg (ref 26.0–34.0)
MCHC: 34.1 g/dL (ref 30.0–36.0)
MCV: 93.9 fL (ref 80.0–100.0)
Platelets: 416 K/uL — ABNORMAL HIGH (ref 150–400)
RBC: 2.94 MIL/uL — ABNORMAL LOW (ref 3.87–5.11)
RDW: 15.3 % (ref 11.5–15.5)
WBC: 12.4 K/uL — ABNORMAL HIGH (ref 4.0–10.5)
nRBC: 0 % (ref 0.0–0.2)

## 2024-04-09 LAB — RENAL FUNCTION PANEL
Albumin: 2.5 g/dL — ABNORMAL LOW (ref 3.5–5.0)
Anion gap: 6 (ref 5–15)
BUN: 19 mg/dL (ref 8–23)
CO2: 23 mmol/L (ref 22–32)
Calcium: 8.3 mg/dL — ABNORMAL LOW (ref 8.9–10.3)
Chloride: 99 mmol/L (ref 98–111)
Creatinine, Ser: 0.94 mg/dL (ref 0.44–1.00)
GFR, Estimated: 59 mL/min — ABNORMAL LOW (ref >60)
Glucose, Bld: 184 mg/dL — ABNORMAL HIGH (ref 70–99)
Phosphorus: 3.3 mg/dL (ref 2.5–4.6)
Potassium: 4.5 mmol/L (ref 3.5–5.1)
Sodium: 128 mmol/L — ABNORMAL LOW (ref 135–145)

## 2024-04-09 LAB — MAGNESIUM: Magnesium: 2.2 mg/dL (ref 1.7–2.4)

## 2024-04-09 MED ORDER — SODIUM CHLORIDE 1 G PO TABS
1.0000 g | ORAL_TABLET | Freq: Two times a day (BID) | ORAL | Status: DC
  Administered 2024-04-09 – 2024-04-11 (×5): 1 g via ORAL
  Filled 2024-04-09 (×5): qty 1

## 2024-04-09 MED ORDER — SODIUM CHLORIDE 0.9 % IV BOLUS
500.0000 mL | Freq: Once | INTRAVENOUS | Status: AC
  Administered 2024-04-09: 500 mL via INTRAVENOUS

## 2024-04-09 NOTE — Progress Notes (Signed)
 PROGRESS NOTE  Jennifer Bailey FMW:[id number] DOB: 12-18-1934   PCP: Oris Camie BRAVO, NP  Patient is from: Home  DOA: 03/30/2024 LOS: 10  Chief complaints Chief Complaint  Patient presents with   Fall   Leg Pain     Brief Narrative / Interim history: 89 year old F with PMH of DM-2, HTN, hypothyroidism, HLD and anemia of chronic disease presented to ED with right hip pain [place].  Denies hitting her head or LOC.  She was admitted with right intertrochanteric femoral fracture.  Underwent cephalomedullary nailing of right intertrochanteric femoral fracture by Dr. Kendal on 6/27.  Hospital course complicated by postoperative ABLA, hypo and hyperglycemia and encephalopathy.  Patient's hemoglobin dropped to 6.8 on 7/2.  She was transfused 1 unit without significant improvement.  Eliquis  and aspirin  held and she was transfused additional unit on 7/3 with improvement in hemoglobin.  Encephalopathy improved after adjusting pain medications.  She might have some underlying cognitive impairment.  Glycemic control improved as well.  Therapy recommended SNF.  Currently stable for discharge.   Subjective: Seen and examined earlier this morning.  No major events overnight or this morning.  Denies pain.  She is awake and alert but seems to be confused.  She is asking for her clothes to go home.   Objective: Vitals:   04/08/24 1516 04/08/24 2216 04/09/24 0505 04/09/24 0846  BP: (!) 104/41 (!) 135/45 (!) 145/49 (!) 139/48  Pulse: 66 73 74 76  Resp: 16  16 17   Temp: 99.3 F (37.4 C) 97.6 F (36.4 C) 98.4 F (36.9 C) (!) 97.5 F (36.4 C)  TempSrc: Oral Oral  Oral  SpO2: 100% 100% 98% 99%  Weight:      Height:        Examination:  GENERAL: No apparent distress.  Nontoxic. HEENT: MMM.  Has dentures.  Vision and hearing grossly intact.  NECK: Supple.  No apparent JVD.  RESP:  No IWOB.  Fair aeration bilaterally. CVS:  RRR.  Heart sounds normal.  ABD/GI/GU: BS+. Abd soft, NTND.  MSK/EXT: Wiggles her toes.  Difficult to move her right leg. SKIN: Jaundice and small bruise over right thigh.  No signs of hematoma. NEURO: Awake and alert.  Oriented to self and place but confused.  No apparent focal neuro deficit. PSYCH: Calm. Normal affect.   Consultants:  Orthopedic surgery  Procedures: 6/27-cephalomedullary nailing of right intertrochanteric femoral fracture by Dr. Kendal  Microbiology summarized: None  Assessment and plan: Ground[place]level [place] her.  Acute right intertrochanteric fracture due to fall -S/p cephalomedullary nailing of right intertrochanteric femoral fracture by Dr. Kendal on 6/27 -Vitamin D  level 42. -WBAT on RLE per orthopedic surgery -Increase Tylenol  to 1 g 3 times daily and discontinue opiate. -Resume low-dose Eliquis  for VTE prophylaxis. -PT/OT recommended SNF  IDDM-2 with hyperglycemia and level 2 hypoglycemia: [name] 8.9%.  CBG improved. Recent Labs  Lab 04/08/24 1127 04/08/24 1627 04/08/24 2109 04/09/24 0653 04/09/24 1140  GLUCAP 353* 71 130* 176* 229*  -Increase Semglee  from 15 units to 20 units daily. -Continue SSI-moderate -Continue NovoLog  3 units 3 times daily with meals starting this evening -Further adjustment as appropriate  Postoperative ABLA superimposed on anemia of chronic disease: Baseline Hgb 10-11.  Reported surgical EBL only 75 cc.  Anemia panel without deficiency.   No overt bleeding.  Surgical site is slightly  tight but no external bleeding or hematoma.   Transfused 2 units so far.  Low-dose Eliquis  resumed on 7/4.  Hgb improved and stable. Recent Labs    04/03/24 0723 04/04/24 0604 04/05/24 0542 04/05/24 1730 04/06/24 0555 04/06/24 1133 04/06/24 2314 04/07/24 0550 04/08/24 0429 04/09/24 0500  HGB 7.4* 7.1* 6.8* 7.4* 7.1* 6.9* 8.7* 9.1* 10.8* 9.4*  - Monitor as needed  Acute toxic  encephalopathy: She has no focal neurodeficit other than difficulty moving RLE from surgery.  Encephalopathy likely due to opiate and delirium.  Improved after holding opiates. -Tylenol  650 mg every 6 hours while awake -Decreased oxycodone  to 2.5 mg every 8 hours as needed for severe pain -Reorientation and delirium precaution -Minimize avoid sedating medications   Essential hypertension: BP within acceptable range. -Continue home losartan   Acute kidney injury: Resolved  Mild hyponatremia: Probably due to hyperglycemia and possible hypovolemia.  Stable. -NS bolus 500 cc x 1 -P.o. sodium chloride  twice daily -Recheck in the morning.  Hyperkalemia: Resolved.  Hypothyroidism -Continue Synthroid .    Leukocytosis: Demargination?  No signs of infection.  Stable. -Recheck in the morning   Physical debilitation muscle weakness -PT/OT recommended SNF.  Moderate malnutrition Body mass index is 26.96 kg/m. Nutrition Problem: Moderate Malnutrition Etiology: social / environmental circumstances (decline in functional status) Signs/Symptoms: moderate muscle depletion, mild fat depletion Interventions: Glucerna shake, Prostat, MVI  DVT prophylaxis:  apixaban  (ELIQUIS ) tablet 2.5 mg Start: 04/07/24 1345 Place and maintain sequential compression device Start: 04/06/24 1239 SCDs Start: 03/30/24 2300 apixaban  (ELIQUIS ) tablet 2.5 mg  Code Status: Full code Family Communication: None at bedside Level of care: Med-Surg Status is: Inpatient Remains inpatient appropriate because: Right hip fracture.    Final disposition: SNF.  Medically stable for discharge.   35 minutes with more than 50% spent in reviewing records, counseling patient/family and coordinating care.   Sch Meds:  Scheduled Meds:  (feeding supplement) PROSource Plus  30 mL Oral BID BM   acetaminophen   650 mg Oral Q6H WA   amLODipine   5 mg Oral Daily   apixaban   2.5 mg Oral BID   brimonidine   1 drop Left Eye BID    cholecalciferol   1,000 Units Oral Daily   dorzolamide -timolol   1 drop Left Eye BID   Fe Fum-Vit C-Vit B12-FA  1 capsule Oral BID   feeding supplement (GLUCERNA SHAKE)  237 mL Oral TID BM   insulin  aspart  0-15 Units Subcutaneous TID WC   insulin  aspart  3 Units Subcutaneous TID WC   insulin  glargine-yfgn  20 Units Subcutaneous Daily   levothyroxine   100 mcg Oral Q0600   multivitamin with minerals  1 tablet Oral Daily   pravastatin   40 mg Oral q1800   Continuous Infusions: PRN Meds:.hydrALAZINE , ondansetron  (ZOFRAN ) IV, mouth rinse, oxyCODONE , traZODone   Antimicrobials: Anti-infectives (From admission, onward)    Start     Dose/Rate Route Frequency Ordered Stop   03/31/24 1915  ceFAZolin  (ANCEF ) IVPB 2g/100 mL premix        2 g 200 mL/hr over 30 Minutes Intravenous Every 8 hours 03/31/24 1333 04/01/24 1403   03/31/24 1032  ceFAZolin  (ANCEF ) 2-4 GM/100ML-% IVPB       Note to Pharmacy: Ezequiel Henri: cabinet override      03/31/24 1032 03/31/24 1127   03/31/24 1030  ceFAZolin  (ANCEF ) IVPB 2g/100 mL premix        2 g 200 mL/hr over 30 Minutes Intravenous On call to O.R. 03/31/24 1029 03/31/24 1123  I have personally reviewed the following labs and images: CBC: Recent Labs  Lab 04/06/24 0555 04/06/24 1133 04/06/24 2314 04/07/24 0550 04/08/24 0429 04/09/24 0500  WBC 12.8* 12.6*  --  10.7* 13.2* 12.4*  HGB 7.1* 6.9* 8.7* 9.1* 10.8* 9.4*  HCT 21.7* 20.7* 25.8* 26.8* 32.1* 27.6*  MCV 93.1 92.8  --  91.5 94.7 93.9  PLT 371 357  --  357 427* 416*   BMP &GFR Recent Labs  Lab 04/06/24 0555 04/07/24 0550 04/08/24 0429 04/08/24 1235 04/09/24 0500  NA 133* 133* 128* 127* 128*  K 4.4 4.5 5.3* 4.3 4.5  CL 100 100 97* 98 99  CO2 24 25 21* 22 23  GLUCOSE 205* 224* 296* 307* 184*  BUN 14 13 22 23 19   CREATININE 0.90 0.86 0.96 0.98 0.94  CALCIUM 8.5* 8.4* 8.7* 8.4* 8.3*  MG  --  2.1 2.3  --  2.2  PHOS  --  3.3 3.9 2.9 3.3   Estimated Creatinine Clearance:  32.5 mL/min (by C-G formula based on SCr of 0.94 mg/dL). Liver & Pancreas: Recent Labs  Lab 04/07/24 0550 04/08/24 0429 04/08/24 1235 04/09/24 0500  ALBUMIN  2.4* 2.8* 2.5* 2.5*   No results for input(s): LIPASE, AMYLASE in the last 168 hours. No results for input(s): AMMONIA in the last 168 hours. Diabetic: No results for input(s): HGBA1C in the last 72 hours. Recent Labs  Lab 04/08/24 1127 04/08/24 1627 04/08/24 2109 04/09/24 0653 04/09/24 1140  GLUCAP 353* 71 130* 176* 229*   Cardiac Enzymes: No results for input(s): CKTOTAL, CKMB, CKMBINDEX, TROPONINI in the last 168 hours. No results for input(s): PROBNP in the last 8760 hours. Coagulation Profile: No results for input(s): INR, PROTIME in the last 168 hours. Thyroid  Function Tests: No results for input(s): TSH, T4TOTAL, FREET4, T3FREE, THYROIDAB in the last 72 hours. Lipid Profile: No results for input(s): CHOL, HDL, LDLCALC, TRIG, CHOLHDL, LDLDIRECT in the last 72 hours. Anemia Panel: No results for input(s): VITAMINB12, FOLATE, FERRITIN, TIBC, IRON, RETICCTPCT in the last 72 hours.  Urine analysis:    Component Value Date/Time   COLORURINE YELLOW 05/18/2022 1338   APPEARANCEUR CLEAR 05/18/2022 1338   LABSPEC 1.010 05/18/2022 1338   PHURINE 6.5 05/18/2022 1338   GLUCOSEU NEGATIVE 05/18/2022 1338   HGBUR NEGATIVE 05/18/2022 1338   BILIRUBINUR NEGATIVE 05/18/2022 1338   KETONESUR NEGATIVE 05/18/2022 1338   PROTEINUR NEGATIVE 04/29/2018 2300   UROBILINOGEN 0.2 05/18/2022 1338   NITRITE NEGATIVE 05/18/2022 1338   LEUKOCYTESUR TRACE (A) 05/18/2022 1338   Sepsis Labs: Invalid input(s): PROCALCITONIN, LACTICIDVEN  Microbiology: Recent Results (from the past 240 hours)  Surgical pcr screen     Status: None   Collection Time: 03/31/24 10:30 AM   Specimen: Nasal Mucosa; Nasal Swab  Result Value Ref Range Status   MRSA, PCR NEGATIVE NEGATIVE Final    Staphylococcus aureus NEGATIVE NEGATIVE Final    Comment: (NOTE) The Xpert SA Assay (FDA approved for NASAL specimens in patients 59 years of age and older), is one component of a comprehensive surveillance program. It is not intended to diagnose infection nor to guide or monitor treatment. Performed at [facility], [address], [place], [place] [address]     Radiology Studies: No results found.    Keron Neenan T. Joselynne Killam [facility]  If 7PM-7AM, please contact night-coverage [email or web address] 04/09/2024, 11:55 AM

## 2024-04-09 NOTE — Plan of Care (Signed)
  Problem: Metabolic: Goal: Ability to maintain appropriate glucose levels will improve Outcome: Progressing   Problem: Metabolic: Goal: Ability to maintain appropriate glucose levels will improve Outcome: Progressing   Problem: Metabolic: Goal: Ability to maintain appropriate glucose levels will improve Outcome: Progressing   Problem: Metabolic: Goal: Ability to maintain appropriate glucose levels will improve Outcome: Progressing   Problem: Metabolic: Goal: Ability to maintain appropriate glucose levels will improve Outcome: Progressing

## 2024-04-10 DIAGNOSIS — S72001A Fracture of unspecified part of neck of right femur, initial encounter for closed fracture: Secondary | ICD-10-CM | POA: Diagnosis not present

## 2024-04-10 LAB — CBC
HCT: 35.1 % — ABNORMAL LOW (ref 36.0–46.0)
Hemoglobin: 11.3 g/dL — ABNORMAL LOW (ref 12.0–15.0)
MCH: 31.1 pg (ref 26.0–34.0)
MCHC: 32.2 g/dL (ref 30.0–36.0)
MCV: 96.7 fL (ref 80.0–100.0)
Platelets: 471 K/uL — ABNORMAL HIGH (ref 150–400)
RBC: 3.63 MIL/uL — ABNORMAL LOW (ref 3.87–5.11)
RDW: 15.5 % (ref 11.5–15.5)
WBC: 12.9 K/uL — ABNORMAL HIGH (ref 4.0–10.5)
nRBC: 0 % (ref 0.0–0.2)

## 2024-04-10 LAB — RENAL FUNCTION PANEL
Albumin: 3 g/dL — ABNORMAL LOW (ref 3.5–5.0)
Anion gap: 11 (ref 5–15)
BUN: 18 mg/dL (ref 8–23)
CO2: 20 mmol/L — ABNORMAL LOW (ref 22–32)
Calcium: 8.7 mg/dL — ABNORMAL LOW (ref 8.9–10.3)
Chloride: 104 mmol/L (ref 98–111)
Creatinine, Ser: 0.8 mg/dL (ref 0.44–1.00)
GFR, Estimated: >60 mL/min (ref >60)
Glucose, Bld: 109 mg/dL — ABNORMAL HIGH (ref 70–99)
Phosphorus: 4.2 mg/dL (ref 2.5–4.6)
Potassium: 4.1 mmol/L (ref 3.5–5.1)
Sodium: 135 mmol/L (ref 135–145)

## 2024-04-10 LAB — MAGNESIUM: Magnesium: 2.2 mg/dL (ref 1.7–2.4)

## 2024-04-10 LAB — GLUCOSE, CAPILLARY
Glucose-Capillary: 133 mg/dL — ABNORMAL HIGH (ref 70–99)
Glucose-Capillary: 208 mg/dL — ABNORMAL HIGH (ref 70–99)
Glucose-Capillary: 96 mg/dL (ref 70–99)
Glucose-Capillary: 85 mg/dL (ref 70–99)

## 2024-04-10 MED ORDER — POLYETHYLENE GLYCOL 3350 17 G PO PACK: 17.0000 g | PACK | Freq: Every day | ORAL | Status: DC

## 2024-04-10 MED ORDER — AMLODIPINE BESYLATE 10 MG PO TABS: 10.0000 mg | ORAL_TABLET | Freq: Every day | ORAL | Status: DC

## 2024-04-10 MED ORDER — OXYCODONE HCL 5 MG PO TABS: 2.5000 mg | ORAL_TABLET | Freq: Three times a day (TID) | ORAL | 0 refills | Status: DC | PRN

## 2024-04-10 NOTE — Progress Notes (Signed)
 Speech Language Pathology Treatment: Dysphagia  Patient Details Name: Jennifer Bailey MRN: [id number] DOB: [birth year] Today's Date: 04/10/2024 Time: 1130-1140 SLP Time Calculation (min) (ACUTE ONLY): 10 min  Assessment / Plan / Recommendation Clinical Impression  Pt seen for follow up after BSE completed 04/07/24. Pt resting in bed. No family present. Pt accepted trials of thin liquid, puree, and solid textures. Extended oral prep of dry cracker noted. No overt s/s aspiration given multiple presentations of consistencies. Pt reports no difficulty with swallowing. RN reports tolerance of PO meds cut in half with sips of water. Recommend continuing with dys3 (mech soft) diet for energy conservation. ST will sign off at this time. Please reconsult if needs arise.    HPI HPI: Pt is an 89 y/o F presenting to ED on 6/26 with R hip pain after fall, found to have R intertrochanteric hip fx s/p IMN on 6/27. Swallow eval was ordered 7/4 due to concern for oral dysphagia from RN. PMH includes memory changes, tremulousness of voice, DM2, hypothyroidism, HTN, HLD      SLP Plan  Discharge SLP treatment due to goals met          Recommendations  Diet recommendations: Dysphagia 3 (mechanical soft);Thin liquid Liquids provided via: Cup;Straw Medication Administration: Other (Comment) (as tolerated) Supervision: Patient able to self feed;Intermittent supervision to cue for compensatory strategies;Staff to assist with self feeding Compensations: Slow rate;Small sips/bites;Follow solids with liquid Postural Changes and/or Swallow Maneuvers: Seated upright 90 degrees;Upright 30-60 min after meal        Oral care BID     Dysphagia, unspecified (R13.10)     Discharge SLP treatment due to goals met    Alphia Behanna B. Dory, MSP, CCC-SLP Speech Language Pathologist Office: [phone]  [name] 04/10/2024, 12:50 PM
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
## 2024-04-10 NOTE — Discharge Summary (Signed)
 Physician Discharge Summary  Jennifer Bailey FMW:[id number] DOB: Dec 04, 1934 DOA: 03/30/2024  PCP: Oris Camie BRAVO, NP  Admit date: 03/30/2024 Discharge date: 04/12/2024  Admitted From: Home Disposition:  SNF  Discharge Condition:Stable CODE STATUS:FULL Diet recommendation: Dysphagia 3  Brief/Interim Summary: 89 year old F with PMH of DM-2, HTN, hypothyroidism, HLD and anemia of chronic disease presented to ED with right hip pain [place].  Denies hitting her head or LOC.  She was admitted with right intertrochanteric femoral fracture.  Underwent cephalomedullary nailing of right intertrochanteric femoral fracture by Dr. Kendal on 6/27. Hospital course complicated by postoperative ABLA, hypo and hyperglycemia and encephalopathy.  Patient's hemoglobin dropped to 6.8 on 7/2.  She was transfused 1 unit without significant improvement.  Eliquis  and aspirin  held and she was transfused additional unit on 7/3 with improvement in hemoglobin.  Encephalopathy improved after adjusting pain medications.  She might have some underlying cognitive impairment.  Glycemic control improved as well.Therapy recommended SNF.  Currently stable for discharge.  She needs to follow-up with orthopedics as an outpatient    Following problems were addressed during the hospitalization:  Ground-level fall-fell on right hip/ Acute right intertrochanteric fracture due to fall -S/p cephalomedullary nailing of right intertrochanteric femoral fracture by Dr. Kendal on 6/27 -Vitamin D  level 42. -WBAT on RLE per orthopedic surgery -Resume low-dose Eliquis  for VTE prophylaxis. -PT/OT recommended SNF   IDDM-2 with hyperglycemia and level 2 hypoglycemia: [name] 8.9%.  CBG improved. -Continue current insulin  regimen -Further adjustment as appropriate   Postoperative ABLA superimposed on anemia of chronic disease: Baseline Hgb 10-11.  Transfused 2 units so far.  Low-dose  Eliquis  resumed on 7/4.  Hgb improved and stable.   Acute toxic encephalopathy: She has no focal neurodeficit other than difficulty moving RLE from surgery.  Encephalopathy likely due to opiate and delirium.  Improved after holding opiates. -Decreased oxycodone  to 2.5 mg every 8 hours as needed for severe pain -Reorientation and delirium precaution -Minimize avoid sedating medications   Essential hypertension: BP within acceptable range. Continue amlodipine .  Was on losartan  at home   Acute kidney injury: Resolved    Hypothyroidism -Continue Synthroid .     Leukocytosis: Demargination?  No signs of infection.  Stable.   Physical debilitation muscle weakness -PT/OT recommended SNF.    Discharge Diagnoses:  Principal Problem:   Hip fracture (HCC) Active Problems:   Type 2 diabetes mellitus with hypoglycemia without coma, with long-term current use of insulin  (HCC)   Hypertension associated with diabetes (HCC)   Hyperlipidemia associated with type 2 diabetes mellitus (HCC)   Osteoporosis   Acquired autoimmune hypothyroidism   Anemia   Depression, major, single episode, mild (HCC)   ARF (acute renal failure) (HCC)   Malnutrition of moderate degree    Discharge Instructions  Discharge Instructions     Diet - low sodium heart healthy   Complete by: As directed    Diet Carb Modified   Complete by: As directed    Dysphagia 3   Discharge instructions   Complete by: As directed    1)Please take your medications as instructed  2)Follow up with orthopedics as an outpatient  3)Monitor your blood sugar   Increase activity slowly   Complete by: As directed    Increase activity slowly   Complete by: As directed    No wound care   Complete by: As directed    No wound care   Complete  by: As directed       Allergies as of 04/12/2024       Reactions   Macrodantin [nitrofurantoin Macrocrystal] Swelling   Prednisone    Make real shaky-like, nervious   Penicillins Rash         Medication List     STOP taking these medications    aspirin  81 MG tablet   losartan  50 MG tablet Commonly known as: COZAAR        TAKE these medications    (feeding supplement) PROSource Plus liquid Take 30 mLs by mouth 2 (two) times daily between meals.   feeding supplement (GLUCERNA SHAKE) Liqd Take 237 mLs by mouth 3 (three) times daily between meals.   Alcohol  Pads 70 % Pads USE 4 PER DAY   alendronate  70 MG tablet Commonly known as: FOSAMAX  Take with a full glass of water on an empty stomach. What changed:  how much to take how to take this when to take this   amLODipine  5 MG tablet Commonly known as: NORVASC  Take 1 tablet (5 mg total) by mouth daily.   apixaban  2.5 MG Tabs tablet Commonly known as: Eliquis  Take 1 tablet (2.5 mg total) by mouth 2 (two) times daily.   BD Pen Needle Nano U/F 32G X 4 MM Misc Generic drug: Insulin  Pen Needle USE AS DIRECTED FIVE TIMES A DAY.   brimonidine  0.2 % ophthalmic solution Commonly known as: ALPHAGAN  Place 1 drop into the left eye 2 (two) times daily.   dorzolamidel-timolol  22.3-6.8 MG/ML Soln ophthalmic solution Commonly known as: COSOPT  Place 1 drop into the left eye 2 (two) times daily.   FreeStyle Libre 14 Day Reader Espiridion USE DAILY TO CHECK BLOOD SUGAR   FreeStyle Libre 2 Sensor Misc APPLY TO ARM EVERY 14 DAYS TO MONITOR BLOOD SUGAR.   furosemide  20 MG tablet Commonly known as: LASIX  Take 1 tablet by mouth every other day. What changed:  how much to take how to take this when to take this reasons to take this additional instructions   gabapentin  100 MG capsule Commonly known as: NEURONTIN  TAKE 4 CAPSULES (400 MG TOTAL) BY MOUTH AT BEDTIME. What changed: how much to take   Gvoke HypoPen  1-Pack 1 MG/0.2ML Soaj Generic drug: Glucagon  Inject 1 mg into the skin as needed (low blood sugar with impaired consciousness).   insulin  aspart 100 UNIT/ML injection Commonly known as:  novoLOG  Inject 0-15 Units into the skin 3 (three) times daily with meals. CBG 70 - 120: 0 units CBG 121 - 150: 2 units CBG 151 - 200: 3 units CBG 201 - 250: 5 units CBG 251 - 300: 8 units CBG 301 - 350: 11 units CBG 351 - 400: 15 units CBG > 400: call MD and obtain STAT lab verification   insulin  lispro 100 UNIT/ML KwikPen Commonly known as: HUMALOG  INJECT 3 TO 5 UNITS SUBCUTANEOUSLY BEFORE MEALS AS DIRECTED. What changed:  how much to take how to take this when to take this   Janumet  50-500 MG tablet Generic drug: sitaGLIPtin-metformin Take 1 tablet by mouth 2 (two) times daily with a meal.   levothyroxine  100 MCG tablet Commonly known as: SYNTHROID  TAKE 1 TABLET (100 MCG TOTAL) BY MOUTH DAILY BEFORE BREAKFAST.   lovastatin  40 MG tablet Commonly known as: MEVACOR  TAKE 1 TABLET BY MOUTH ONCE DAILY WITH A MEAL.   Omega-3 1000 MG Caps Take 2 capsules by mouth 2 (two) times daily.   OneTouch Verio test strip Generic drug: glucose blood USE  TO TEST BLOOD SUGAR 4 TIMES DAILY   OneTouch Ultra test strip Generic drug: glucose blood Use to check blood sugar once a day if needed   oxyCODONE  5 MG immediate release tablet Commonly known as: Oxy IR/ROXICODONE  Take 0.5 tablets (2.5 mg total) by mouth 3 (three) times daily as needed for severe pain (pain score 7-10).   polyethylene glycol 17 g packet Commonly known as: MiraLax  Take 17 g by mouth daily.   PreserVision AREDS 2 Caps Take 1 capsule by mouth 2 (two) times daily.   traZODone  50 MG tablet Commonly known as: DESYREL  Take 0.5-1 tablets (25-50 mg total) by mouth at bedtime as needed for sleep.   Tresiba  FlexTouch 100 UNIT/ML FlexTouch Pen Generic drug: insulin  degludec Inject 15 Units into the skin daily. What changed: See the new instructions.   vitamin D3 25 MCG tablet Commonly known as: CHOLECALCIFEROL  Take 1 tablet (1,000 Units total) by mouth daily.        Contact information for follow-up providers      Haddix, Franky SQUIBB, MD. Schedule an appointment as soon as possible for a visit in 2 week(s).   Specialty: Orthopedic Surgery Why: for wound check and repeat x-rays Contact information: [address] [place] [address] [phone]              Contact information for after-discharge care     Destination     [facility] [place], [place] .   Service: Skilled Nursing Contact information: [address] [place]  [phone]                    Allergies  Allergen Reactions   Macrodantin [Nitrofurantoin Macrocrystal] Swelling   Prednisone     Make real shaky-like, nervious   Penicillins Rash    Consultations: Orthopedics   Procedures/Studies: DG HIP UNILAT WITH PELVIS 2-3 VIEWS RIGHT Result Date: 03/31/2024 CLINICAL DATA:  Right hip fracture. EXAM: DG HIP (WITH OR WITHOUT PELVIS) 2-3V RIGHT COMPARISON:  Fluoroscopic images of same day.  March 30, 2024. FINDINGS: Status post surgical internal fixation of proximal right femoral intertrochanteric fracture. Expected postoperative changes are seen in the surrounding soft tissues. IMPRESSION: Status post surgical internal fixation of proximal right femoral intertrochanteric fracture. Electronically Signed   By: Lynwood Landy Raddle M.D.   On: 03/31/2024 15:49   DG FEMUR, MIN 2 VIEWS RIGHT Result Date: 03/31/2024 CLINICAL DATA:  Elective surgery. EXAM: RIGHT FEMUR 2 VIEWS COMPARISON:  Preoperative imaging FINDINGS: Six fluoroscopic spot views of the right proximal femur submitted from the operating room. Femoral intramedullary nail with trans trochanteric and distal locking screw fixation traverse proximal femur fracture. Fluoroscopy time 57.6 seconds. Dose 6.06 mGy. IMPRESSION: Intraoperative fluoroscopy during proximal femur fracture fixation. Electronically Signed   By: Andrea Gasman M.D.   On: 03/31/2024 12:46   DG C-Arm 1-60 Min-No Report Result Date: 03/31/2024 Fluoroscopy was  utilized by the requesting physician.  No radiographic interpretation.   DG Knee Complete 4 Views Right Result Date: 03/30/2024 CLINICAL DATA:  Fall. EXAM: RIGHT KNEE - COMPLETE 4+ VIEW COMPARISON:  None Available. FINDINGS: Status post surgical internal fixation of old proximal right tibial fracture. No acute fracture or dislocation is noted. IMPRESSION: No acute abnormality seen. Electronically Signed   By: Lynwood Landy Raddle M.D.   On: 03/30/2024 17:15   DG Hip Unilat W or Wo Pelvis 2-3 Views Right Result Date: 03/30/2024 CLINICAL DATA:  Fall. EXAM: DG HIP (WITH OR WITHOUT PELVIS)  2-3V RIGHT COMPARISON:  None Available. FINDINGS: Moderately displaced and possibly comminuted intertrochanteric fracture of proximal right femur is noted. Left hip is unremarkable. IMPRESSION: Moderately displaced and comminuted proximal right femoral intertrochanteric fracture. Electronically Signed   By: Lynwood Landy Raddle M.D.   On: 03/30/2024 17:13   DG Chest 1 View Result Date: 03/30/2024 CLINICAL DATA:  Fall. EXAM: CHEST  1 VIEW COMPARISON:  December 02, 2020. FINDINGS: The heart size and mediastinal contours are within normal limits. Stable minimal left basilar scarring. No acute pulmonary disease is noted. The visualized skeletal structures are unremarkable. IMPRESSION: No active disease. Electronically Signed   By: Lynwood Landy Raddle M.D.   On: 03/30/2024 17:11   DG Shoulder Left Result Date: 03/30/2024 CLINICAL DATA:  Left shoulder pain after fall. EXAM: LEFT SHOULDER - 2+ VIEW COMPARISON:  None Available. FINDINGS: There is no evidence of fracture or dislocation. There is no evidence of arthropathy or other focal bone abnormality. Soft tissues are unremarkable. IMPRESSION: Negative. Electronically Signed   By: Lynwood Landy Raddle M.D.   On: 03/30/2024 17:10   CT Cervical Spine Wo Contrast Result Date: 03/30/2024 CLINICAL DATA:  Polytrauma, blunt EXAM: CT CERVICAL SPINE WITHOUT CONTRAST TECHNIQUE: Multidetector CT imaging  of the cervical spine was performed without intravenous contrast. Multiplanar CT image reconstructions were also generated. RADIATION DOSE REDUCTION: This exam was performed according to the departmental dose-optimization program which includes automated exposure control, adjustment of the mA and/or kV according to patient size and/or use of iterative reconstruction technique. COMPARISON:  None Available. FINDINGS: Alignment: Normal. Skull base and vertebrae: Intact.  No osseous lesions are present. Soft tissues and spinal canal: No hematoma or soft tissue injury demonstrated. Moderate calcifications within the carotid bulbs bilaterally. Disc levels: There are moderate degenerative changes within the atlantoaxial joint with thickening and mineralization of the transverse ligament and degenerative geodes within the dens. There is moderate chronic degenerative disc disease at C3-4, C4-5 and C5-6, with posterior endplate ridging causing mild-to-moderate central spinal canal stenosis at each level. There is also mild-to-moderate bilateral neural foraminal stenosis. Upper chest: The visualized lung apices are clear. Other: None. IMPRESSION: 1. Multilevel chronic degenerative disc disease and moderate degenerative changes within the atlantoaxial joint. No evidence of acute traumatic injury. Electronically Signed   By: Evalene Coho M.D.   On: 03/30/2024 16:59   CT Head Wo Contrast Result Date: 03/30/2024 CLINICAL DATA:  Polytrauma, blunt EXAM: CT HEAD WITHOUT CONTRAST TECHNIQUE: Contiguous axial images were obtained from the base of the skull through the vertex without intravenous contrast. RADIATION DOSE REDUCTION: This exam was performed according to the departmental dose-optimization program which includes automated exposure control, adjustment of the mA and/or kV according to patient size and/or use of iterative reconstruction technique. COMPARISON:  None Available. FINDINGS: Brain: Normal brain. No evidence of  hemorrhage, mass, cortical infarct or hydrocephalus. Vascular: Calcific atheromatous disease within the carotid siphons. Skull: Intact and unremarkable. Sinuses/Orbits: Clear paranasal sinuses. Status post bilateral lens surgery. Other: None. IMPRESSION: Normal brain.  No evidence of acute traumatic injury. Electronically Signed   By: Evalene Coho M.D.   On: 03/30/2024 16:55      Subjective: Patient seen and examined at bedside today.  Hemodynamically stable.  Overall comfortable, lying in bed.  Denies any pain on the left hip surgical wound.  Abdomen soft and  nontender.  Medically stable for discharge.  I called and discussed with her daughter July about the possible discharge plan  Discharge Exam: Vitals:  04/12/24 0723 04/12/24 1332  BP: (!) 132/48 (!) 102/45  Pulse: 78 67  Resp: 17 16  Temp: 98.2 F (36.8 C) 98.1 F (36.7 C)  SpO2: 100% 99%   Vitals:   04/11/24 2020 04/12/24 0447 04/12/24 0723 04/12/24 1332  BP: (!) 129/41 (!) 114/47 (!) 132/48 (!) 102/45  Pulse: 76 68 78 67  Resp: 19 18 17 16   Temp: 98 F (36.7 C) 98 F (36.7 C) 98.2 F (36.8 C) 98.1 F (36.7 C)  TempSrc:  Oral Oral Oral  SpO2: 100% 100% 100% 99%  Weight:      Height:        General: Pt is alert, awake, not in acute distress Cardiovascular: RRR, S1/S2 +, no rubs, no gallops Respiratory: CTA bilaterally, no wheezing, no rhonchi Abdominal: Soft, NT, ND, bowel sounds + Extremities: no edema, no cyanosis, clean surgical wound on the right hip    The results of significant diagnostics from this hospitalization (including imaging, microbiology, ancillary and laboratory) are listed below for reference.     Microbiology: No results found for this or any previous visit (from the past 240 hours).    Labs: BNP (last 3 results) No results for input(s): BNP in the last 8760 hours. Basic Metabolic Panel: Recent Labs  Lab 04/07/24 0550 04/08/24 0429 04/08/24 1235 04/09/24 0500 04/10/24 0513   NA 133* 128* 127* 128* 135  K 4.5 5.3* 4.3 4.5 4.1  CL 100 97* 98 99 104  CO2 25 21* 22 23 20*  GLUCOSE 224* 296* 307* 184* 109*  BUN 13 22 23 19 18   CREATININE 0.86 0.96 0.98 0.94 0.80  CALCIUM 8.4* 8.7* 8.4* 8.3* 8.7*  MG 2.1 2.3  --  2.2 2.2  PHOS 3.3 3.9 2.9 3.3 4.2   Liver Function Tests: Recent Labs  Lab 04/07/24 0550 04/08/24 0429 04/08/24 1235 04/09/24 0500 04/10/24 0513  ALBUMIN  2.4* 2.8* 2.5* 2.5* 3.0*   No results for input(s): LIPASE, AMYLASE in the last 168 hours. No results for input(s): AMMONIA in the last 168 hours. CBC: Recent Labs  Lab 04/06/24 1133 04/06/24 2314 04/07/24 0550 04/08/24 0429 04/09/24 0500 04/10/24 0513  WBC 12.6*  --  10.7* 13.2* 12.4* 12.9*  HGB 6.9* 8.7* 9.1* 10.8* 9.4* 11.3*  HCT 20.7* 25.8* 26.8* 32.1* 27.6* 35.1*  MCV 92.8  --  91.5 94.7 93.9 96.7  PLT 357  --  357 427* 416* 471*   Cardiac Enzymes: No results for input(s): CKTOTAL, CKMB, CKMBINDEX, TROPONINI in the last 168 hours. BNP: Invalid input(s): POCBNP CBG: Recent Labs  Lab 04/11/24 1116 04/11/24 1742 04/11/24 2231 04/12/24 0631 04/12/24 1130  GLUCAP 169* 155* 81 116* 120*   D-Dimer No results for input(s): DDIMER in the last 72 hours. Hgb [name] No results for input(s): HGBA1C in the last 72 hours. Lipid Profile No results for input(s): CHOL, HDL, LDLCALC, TRIG, CHOLHDL, LDLDIRECT in the last 72 hours. Thyroid  function studies No results for input(s): TSH, T4TOTAL, T3FREE, THYROIDAB in the last 72 hours.  Invalid input(s): FREET3 Anemia work up No results for input(s): VITAMINB12, FOLATE, FERRITIN, TIBC, IRON, RETICCTPCT in the last 72 hours. Urinalysis    Component Value Date/Time   COLORURINE YELLOW 05/18/2022 1338   APPEARANCEUR CLEAR 05/18/2022 1338   LABSPEC 1.010 05/18/2022 1338   PHURINE 6.5 05/18/2022 1338   GLUCOSEU NEGATIVE 05/18/2022 1338   HGBUR NEGATIVE 05/18/2022 1338   BILIRUBINUR  NEGATIVE 05/18/2022 1338   KETONESUR NEGATIVE 05/18/2022 1338   PROTEINUR NEGATIVE 04/29/2018  2300   UROBILINOGEN 0.2 05/18/2022 1338   NITRITE NEGATIVE 05/18/2022 1338   LEUKOCYTESUR TRACE (A) 05/18/2022 1338   Sepsis Labs Recent Labs  Lab 04/07/24 0550 04/08/24 0429 04/09/24 0500 04/10/24 0513  WBC 10.7* 13.2* 12.4* 12.9*   Microbiology No results found for this or any previous visit (from the past 240 hours).   Please note: You were cared for by a hospitalist during your hospital stay. Once you are discharged, your primary care physician will handle any further medical issues. Please note that NO REFILLS for any discharge medications will be authorized once you are discharged, as it is imperative that you return to your primary care physician (or establish a relationship with a primary care physician if you do not have one) for your post hospital discharge needs so that they can reassess your need for medications and monitor your lab values.    Time coordinating discharge: 40 minutes  SIGNED:   Ivonne Mustache, MD  [facility] 04/12/2024, 2:57 PM Pager [phone]  If 7PM-7AM, please contact night-coverage [email or web address] Password [email or web address]

## 2024-04-10 NOTE — Plan of Care (Signed)
  Problem: Education: Goal: Ability to describe self-care measures that may prevent or decrease complications (Diabetes Survival Skills Education) will improve Outcome: Progressing   Problem: Skin Integrity: Goal: Risk for impaired skin integrity will decrease Outcome: Progressing   Problem: Education: Goal: Knowledge of General Education information will improve Description: Including pain rating scale, medication(s)/side effects and non-pharmacologic comfort measures Outcome: Progressing   Problem: Activity: Goal: Risk for activity intolerance will decrease Outcome: Progressing   Problem: Pain Managment: Goal: General experience of comfort will improve and/or be controlled Outcome: Progressing   Problem: Safety: Goal: Ability to remain free from injury will improve Outcome: Progressing   Problem: Skin Integrity: Goal: Risk for impaired skin integrity will decrease Outcome: Progressing

## 2024-04-11 LAB — GLUCOSE, CAPILLARY
Glucose-Capillary: 153 mg/dL — ABNORMAL HIGH (ref 70–99)
Glucose-Capillary: 169 mg/dL — ABNORMAL HIGH (ref 70–99)
Glucose-Capillary: 155 mg/dL — ABNORMAL HIGH (ref 70–99)
Glucose-Capillary: 81 mg/dL (ref 70–99)

## 2024-04-11 NOTE — Plan of Care (Signed)
 Plan of care is reviewed. Pt has been progressing. She is stable hemodynamically, afebrile, no acute distress noted overnight. Pain is well controlled. Pt is able to rest well. We will continue to monitor  Problem: Metabolic: Goal: Ability to maintain appropriate glucose levels will improve Outcome: Progressing   Problem: Nutritional: Goal: Maintenance of adequate nutrition will improve Outcome: Progressing Goal: Progress toward achieving an optimal weight will improve Outcome: Progressing   Problem: Skin Integrity: Goal: Risk for impaired skin integrity will decrease Outcome: Progressing   Problem: Clinical Measurements: Goal: Ability to maintain clinical measurements within normal limits will improve Outcome: Progressing Goal: Will remain free from infection Outcome: Progressing Goal: Diagnostic test results will improve Outcome: Progressing Goal: Respiratory complications will improve Outcome: Progressing Goal: Cardiovascular complication will be avoided Outcome: Progressing   Problem: Activity: Goal: Risk for activity intolerance will decrease Outcome: Progressing   Problem: Nutrition: Goal: Adequate nutrition will be maintained Outcome: Progressing   Problem: Safety: Goal: Ability to remain free from injury will improve Outcome: Progressing   Problem: Pain Managment: Goal: General experience of comfort will improve and/or be controlled Outcome: Progressing   Wendi Dash, RN

## 2024-04-11 NOTE — Progress Notes (Signed)
 Patient seen and examined at bedside today.  She was comfortably lying on the bed.  Denies any new complaints.  Had a small bowel movement this morning.  Vitals are stable.  She is medically stable for discharge to SNF whenever possible.  Discharge summary and orders are already there.  No change in the medical management
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
## 2024-04-11 NOTE — Progress Notes (Signed)
 Occupational Therapy Treatment Patient Details Name: Jennifer Bailey MRN: [id number] DOB: 11-23-1934 Today's Date: 04/11/2024   History of present illness Pt is an 89 y/o F presenting to ED on 6/26 with R hip pain after fall, found to have R intertrochanteric hip fx s/p IMN on 6/27. PMH includes DM2, hypothyroidism, HTN, HLD   OT comments  Patient received in supine and agreeable to OT treatment. Patient with complaints of RLE pain requiring increased time and mod assist to get to EOB. Patient instructed on hand placement and body mechanics with mod assist to stand and mod/max assist to transfer to [facility] with patient demonstrating fear of falling. Patient able to stand for toilet hygiene and required seated rest break before transfer to recliner.  Patient will benefit from continued inpatient follow up therapy, <3 hours/day. Acute OT to continue to follow to address established goals to facilitate DC to next venue of care.        If plan is discharge home, recommend the following:  A lot of help with walking and/or transfers;A lot of help with bathing/dressing/bathroom;Assistance with cooking/housework;Direct supervision/assist for medications management;Direct supervision/assist for financial management;Assist for transportation;Help with stairs or ramp for entrance   Equipment Recommendations  Other (comment) (defer)    Recommendations for Other Services      Precautions / Restrictions Precautions Precautions: Fall Recall of Precautions/Restrictions: Intact Restrictions Weight Bearing Restrictions Per Provider Order: Yes RLE Weight Bearing Per Provider Order: Weight bearing as tolerated       Mobility Bed Mobility Overal bed mobility: Needs Assistance Bed Mobility: Rolling, Sidelying to Sit Rolling: Mod assist, Used rails Sidelying to sit: Mod assist, Used rails       General bed mobility comments: increased time due to RLE pain and assistance with bed pad to assist with  scooting hips to EOB    Transfers Overall transfer level: Needs assistance Equipment used: Rolling walker (2 wheels) Transfers: Sit to/from Stand, Bed to chair/wheelchair/BSC Sit to Stand: Mod assist Stand pivot transfers: Max assist, Mod assist         General transfer comment: assistance with sit to stands and to pivot to [facility] and recliner, patient appeared fearful of falling     Balance Overall balance assessment: Needs assistance Sitting-balance support: Feet supported Sitting balance-Leahy Scale: Fair     Standing balance support: Reliant on assistive device for balance, Bilateral upper extremity supported, During functional activity Standing balance-Leahy Scale: Poor Standing balance comment: reliant on external support                           ADL either performed or assessed with clinical judgement   ADL Overall ADL's : Needs assistance/impaired     Grooming: Supervision/safety;Sitting Grooming Details (indicate cue type and reason): on EOB Upper Body Bathing: Minimal assistance;Sitting Upper Body Bathing Details (indicate cue type and reason): on EOB Lower Body Bathing: Maximal assistance;Sit to/from stand   Upper Body Dressing : Minimal assistance;Sitting   Lower Body Dressing: Maximal assistance;Sitting/lateral leans;Sit to/from stand   Toilet Transfer: Maximal assistance;Stand-pivot;Rolling walker (2 wheels);BSC/3in1   Toileting- Clothing Manipulation and Hygiene: Maximal assistance;Sit to/from stand       Functional mobility during ADLs: Maximal assistance;Rolling walker (2 wheels) General ADL Comments: due to pain    Extremity/Trunk Assessment              Vision       Perception     Praxis     Communication  Communication Communication: No apparent difficulties   Cognition Arousal: Alert Behavior During Therapy: WFL for tasks assessed/performed Cognition: No family/caregiver present to determine baseline              OT - Cognition Comments: fixated on being cold                 Following commands: Intact Following commands impaired: Only follows one step commands consistently      Cueing   Cueing Techniques: Verbal cues  Exercises      Shoulder Instructions       General Comments VSS on RA    Pertinent Vitals/ Pain       Pain Assessment Pain Assessment: Faces Faces Pain Scale: Hurts even more Pain Location: RLE with movement Pain Descriptors / Indicators: Guarding, Grimacing, Moaning, Aching, Discomfort Pain Intervention(s): Limited activity within patient's tolerance, Monitored during session, Premedicated before session, Repositioned  Home Living                                          Prior Functioning/Environment              Frequency  Min 2X/week        Progress Toward Goals  OT Goals(current goals can now be found in the care plan section)  Progress towards OT goals: Progressing toward goals  Acute Rehab OT Goals Patient Stated Goal: get warm OT Goal Formulation: With patient Time For Goal Achievement: 04/15/24 Potential to Achieve Goals: Good ADL Goals Pt Will Perform Upper Body Dressing: with supervision;sitting Pt Will Perform Lower Body Dressing: with min assist;with adaptive equipment;sitting/lateral leans;sit to/from stand Pt Will Transfer to Toilet: with min assist;ambulating;regular height toilet Pt Will Perform Tub/Shower Transfer: Tub transfer;Shower transfer;with min assist;ambulating;shower seat;rolling walker  Plan      Co-evaluation                 AM-PAC OT 6 Clicks Daily Activity     Outcome Measure   Help from another person eating meals?: A Little Help from another person taking care of personal grooming?: A Little Help from another person toileting, which includes using toliet, bedpan, or urinal?: A Lot Help from another person bathing (including washing, rinsing, drying)?: A Lot Help from another  person to put on and taking off regular upper body clothing?: A Lot Help from another person to put on and taking off regular lower body clothing?: A Lot 6 Click Score: 14    End of Session Equipment Utilized During Treatment: Gait belt;Rolling walker (2 wheels)  OT Visit Diagnosis: Unsteadiness on feet (R26.81);Other abnormalities of gait and mobility (R26.89);Muscle weakness (generalized) (M62.81)   Activity Tolerance Patient tolerated treatment well   Patient Left in chair;with call bell/phone within reach;with chair alarm set   Nurse Communication Mobility status        Time: 9276-9247 OT Time Calculation (min): 29 min  Charges: OT General Charges $OT Visit: 1 Visit OT Treatments $Self Care/Home Management : 23-37 mins  Dick Laine, OTA Acute Rehabilitation Services  Office [phone]   Jeb LITTIE Laine 04/11/2024, 10:42 AM

## 2024-04-11 NOTE — Progress Notes (Signed)
 Physical Therapy Treatment Patient Details Name: Jennifer Bailey MRN: [id number] DOB: 04/30/1935 Today's Date: 04/11/2024   History of Present Illness Pt is an 89 y/o F presenting to ED on 03/30/24 with R hip pain after fall, found to have R intertrochanteric hip fx s/p IMN on 6/27. PMH includes DM2, hypothyroidism, HTN, HLD    PT Comments  Pt making slow progress. Continues to need PRAFO for R ankle as she is keeping it in plantar flexion and inversion even in standing and c/o hip pain when being stretched passively. Pt needed mod A +2 for first sit>stand, then mod A of 1 for subsequent trials. Pt ambulated fwd and bkwd, not more that 4' at a time with RW and mod A +2.  Will need a w/c for household and community mobility. Patient will benefit from continued inpatient follow up therapy, <3 hours/day.    If plan is discharge home, recommend the following: A lot of help with walking and/or transfers;A lot of help with bathing/dressing/bathroom;Assistance with cooking/housework;Assist for transportation;Help with stairs or ramp for entrance   Can travel by private vehicle     No  Equipment Recommendations  Wheelchair (measurements PT);Wheelchair cushion (measurements PT)    Recommendations for Other Services       Precautions / Restrictions Precautions Precautions: Fall Recall of Precautions/Restrictions: Intact Restrictions Weight Bearing Restrictions Per Provider Order: Yes RLE Weight Bearing Per Provider Order: Weight bearing as tolerated     Mobility  Bed Mobility               General bed mobility comments: pt received in recliner    Transfers Overall transfer level: Needs assistance Equipment used: Rolling walker (2 wheels) Transfers: Sit to/from Stand Sit to Stand: Mod assist, +2 physical assistance           General transfer comment: needed mod A +2 for power up. Was able to stand with mod next 3 sit>stand    Ambulation/Gait Ambulation/Gait assistance: +2  safety/equipment, Mod assist (chair follow) Gait Distance (Feet): 8 Feet (2', 4', 2') Assistive device: Rolling walker (2 wheels) Gait Pattern/deviations: Step-to pattern, Decreased step length - right, Decreased step length - left, Antalgic, Decreased stance time - right, Decreased weight shift to right, Decreased dorsiflexion - right, Trunk flexed Gait velocity: decreased Gait velocity interpretation: <1.31 ft/sec, indicative of household ambulator Pre-gait activities: standing wt shifting General Gait Details: Pt staying on ball of R foot with ankle inverted. Unable to get heel to floor. Pt had an easier time taking bkwd steps than fwd.   Stairs             Wheelchair Mobility     Tilt Bed    Modified Rankin (Stroke Patients Only)       Balance Overall balance assessment: Needs assistance Sitting-balance support: Feet supported Sitting balance-Leahy Scale: Fair     Standing balance support: Reliant on assistive device for balance, Bilateral upper extremity supported, During functional activity Standing balance-Leahy Scale: Poor Standing balance comment: reliant on external support                            Communication Communication Communication: No apparent difficulties  Cognition Arousal: Alert Behavior During Therapy: WFL for tasks assessed/performed   PT - Cognitive impairments: Sequencing, Initiation, Safety/Judgement, Memory                       PT - Cognition Comments: Pt with  delayed processing. Took increased time to complete all tasks and required moderate cues. Does not remember education from past sessions Following commands: Intact Following commands impaired: Only follows one step commands consistently    Cueing Cueing Techniques: Verbal cues  Exercises General Exercises - Lower Extremity Long Arc Quad: AROM, Right, 5 reps, Seated Other Exercises Other Exercises: passive stretch for R df, pt tolerated only minimally and  reported it caused pain at hip    General Comments General comments (skin integrity, edema, etc.): VSS on RA      Pertinent Vitals/Pain Pain Assessment Pain Assessment: Faces Faces Pain Scale: Hurts even more Pain Location: RLE with movement Pain Descriptors / Indicators: Guarding, Grimacing, Moaning, Aching, Discomfort Pain Intervention(s): Limited activity within patient's tolerance, Monitored during session    Home Living                          Prior Function            PT Goals (current goals can now be found in the care plan section) Acute Rehab PT Goals Patient Stated Goal: Have less pain and move easier PT Goal Formulation: With patient Time For Goal Achievement: 04/15/24 Potential to Achieve Goals: Fair Progress towards PT goals: Progressing toward goals    Frequency    Min 2X/week      PT Plan      Co-evaluation              AM-PAC PT 6 Clicks Mobility   Outcome Measure  Help needed turning from your back to your side while in a flat bed without using bedrails?: A Lot Help needed moving from lying on your back to sitting on the side of a flat bed without using bedrails?: A Little Help needed moving to and from a bed to a chair (including a wheelchair)?: Total Help needed standing up from a chair using your arms (e.g., wheelchair or bedside chair)?: Total Help needed to walk in hospital room?: Total Help needed climbing 3-5 steps with a railing? : Total 6 Click Score: 9    End of Session Equipment Utilized During Treatment: Gait belt Activity Tolerance: Patient limited by fatigue;Patient limited by pain Patient left: in chair;with call bell/phone within reach;with chair alarm set Nurse Communication: Mobility status;Other (comment) (pt need for PRAFOs for RLE foot position.) PT Visit Diagnosis: Other abnormalities of gait and mobility (R26.89);Muscle weakness (generalized) (M62.81)     Time: 8960-8894 PT Time Calculation (min)  (ACUTE ONLY): 26 min  Charges:    $Therapeutic Activity: 23-37 mins PT General Charges $$ ACUTE PT VISIT: 1 Visit                     Richerd Lipoma, PT  Acute Rehab Services Secure chat preferred Office [phone]    Richerd CROME Henry Utsey 04/11/2024, 1:31 PM

## 2024-04-11 NOTE — Plan of Care (Signed)
  Problem: Fluid Volume: Goal: Ability to maintain a balanced intake and output will improve Outcome: Progressing   Problem: Metabolic: Goal: Ability to maintain appropriate glucose levels will improve Outcome: Progressing   Problem: Nutritional: Goal: Maintenance of adequate nutrition will improve Outcome: Progressing Goal: Progress toward achieving an optimal weight will improve Outcome: Progressing   

## 2024-04-12 ENCOUNTER — Inpatient Hospital Stay (HOSPITAL_COMMUNITY)

## 2024-04-12 DIAGNOSIS — S72001A Fracture of unspecified part of neck of right femur, initial encounter for closed fracture: Secondary | ICD-10-CM | POA: Diagnosis not present

## 2024-04-12 LAB — GLUCOSE, CAPILLARY
Glucose-Capillary: 116 mg/dL — ABNORMAL HIGH (ref 70–99)
Glucose-Capillary: 120 mg/dL — ABNORMAL HIGH (ref 70–99)
Glucose-Capillary: 41 mg/dL — CL (ref 70–99)
Glucose-Capillary: 144 mg/dL — ABNORMAL HIGH (ref 70–99)
Glucose-Capillary: 226 mg/dL — ABNORMAL HIGH (ref 70–99)

## 2024-04-12 MED ORDER — OXYCODONE HCL 5 MG PO TABS: 2.5000 mg | ORAL_TABLET | Freq: Three times a day (TID) | ORAL | 0 refills | Status: DC | PRN

## 2024-04-12 MED ORDER — SENNOSIDES-DOCUSATE SODIUM 8.6-50 MG PO TABS
1.0000 | ORAL_TABLET | Freq: Two times a day (BID) | ORAL | Status: DC
  Administered 2024-04-12 – 2024-04-13 (×2): 1 via ORAL
  Filled 2024-04-12 (×2): qty 1

## 2024-04-12 MED ORDER — MORPHINE SULFATE (PF) 2 MG/ML IV SOLN
2.0000 mg | Freq: Once | INTRAVENOUS | Status: AC
  Administered 2024-04-12: 2 mg via INTRAVENOUS
  Filled 2024-04-12: qty 1

## 2024-04-12 MED ORDER — IOHEXOL 350 MG/ML SOLN
75.0000 mL | Freq: Once | INTRAVENOUS | Status: AC | PRN
  Administered 2024-04-12: 75 mL via INTRAVENOUS

## 2024-04-12 MED ORDER — BISACODYL 10 MG RE SUPP
10.0000 mg | Freq: Once | RECTAL | Status: AC
  Administered 2024-04-12: 10 mg via RECTAL
  Filled 2024-04-12: qty 1

## 2024-04-12 MED ORDER — AMLODIPINE BESYLATE 5 MG PO TABS: 5.0000 mg | ORAL_TABLET | Freq: Every day | ORAL | Status: DC

## 2024-04-12 MED ORDER — DEXTROSE 5 % IV SOLN: INTRAVENOUS | Status: DC

## 2024-04-12 MED ORDER — POLYETHYLENE GLYCOL 3350 17 G PO PACK
17.0000 g | PACK | Freq: Two times a day (BID) | ORAL | Status: DC
  Administered 2024-04-12 – 2024-04-13 (×2): 17 g via ORAL
  Filled 2024-04-12 (×2): qty 1

## 2024-04-12 MED ORDER — DEXTROSE IN LACTATED RINGERS 5 % IV SOLN: INTRAVENOUS | Status: DC

## 2024-04-12 NOTE — Plan of Care (Addendum)
 Critical blood sugar, 41, notified Adhikari, gave patient 8oz of juice per hypoglycemic protocol, Adhikari also ordered fluids. Recheck 144. Will continue to monitor patient.   Problem: Education: Goal: Knowledge of General Education information will improve Description: Including pain rating scale, medication(s)/side effects and non-pharmacologic comfort measures Outcome: Progressing   Problem: Activity: Goal: Risk for activity intolerance will decrease Outcome: Progressing   Problem: Pain Managment: Goal: General experience of comfort will improve and/or be controlled Outcome: Progressing   Problem: Safety: Goal: Ability to remain free from injury will improve Outcome: Progressing   Problem: Skin Integrity: Goal: Risk for impaired skin integrity will decrease Outcome: Progressing

## 2024-04-12 NOTE — Progress Notes (Signed)
 PROGRESS NOTE  Jennifer Bailey  FMW:[id number] DOB: [birth year] DOA: 03/30/2024 PCP: Oris Camie BRAVO, NP   Brief Narrative: 89 year old F with PMH of DM-2, HTN, hypothyroidism, HLD and anemia of chronic disease presented to ED with right hip pain [place].  Denies hitting her head or LOC.  She was admitted with right intertrochanteric femoral fracture.  Underwent cephalomedullary nailing of right intertrochanteric femoral fracture by Dr. Kendal on 6/27. Hospital course complicated by postoperative ABLA, hypo and hyperglycemia and encephalopathy.  Patient's hemoglobin dropped to 6.8 on 7/2.  She was transfused 1 unit without significant improvement.  Eliquis  and aspirin  held and she was transfused additional unit on 7/3 with improvement in hemoglobin.  Encephalopathy improved after adjusting pain medications.  She might have some underlying cognitive impairment.  Glycemic control improved as well.Therapy recommended SNF.  Currently stable for discharge.  She needs to follow-up with orthopedics as an outpatient. Developed abd pain today, CT pending  Assessment & Plan:  Principal Problem:   Hip fracture (HCC) Active Problems:   Type 2 diabetes mellitus with hypoglycemia without coma, with long-term current use of insulin  (HCC)   Hypertension associated with diabetes (HCC)   Hyperlipidemia associated with type 2 diabetes mellitus (HCC)   Osteoporosis   Acquired autoimmune hypothyroidism   Anemia   Depression, major, single episode, mild (HCC)   ARF (acute renal failure) (HCC)   Malnutrition of moderate degree   Ground-level fall-fell on right hip/ Acute right intertrochanteric fracture due to fall -S/p cephalomedullary nailing of right intertrochanteric femoral fracture by Dr. Kendal on 6/27 -Vitamin D  level 42. -WBAT on RLE per orthopedic surgery -Resume low-dose Eliquis  for VTE prophylaxis. -PT/OT recommended SNF  Abdominal  pain New problem.  Abdomen was mostly nondistended , good bowel sounds.  Had a bowel movement today.  Since she was complaining of persistent abdominal pain, CT abdomen/pelvis was ordered, result pending.   IDDM-2 with hyperglycemia and level 2 hypoglycemia: [name] 8.9%.  CBG improved. -She was on long-acting and meal coverage insulin .  Blood sugars low today.  Will hold meal coverage insulin .  Continue gentle IV fluids. -Further adjustment as appropriate   Postoperative ABLA superimposed on anemia of chronic disease: Baseline Hgb 10-11.  Transfused 2 units so far.  Low-dose Eliquis  resumed on 7/4.  Hgb improved and stable.   Acute toxic encephalopathy: She has no focal neurodeficit other than difficulty moving RLE from surgery.  Encephalopathy likely due to opiate and delirium.  Improved after holding opiates. -Decreased oxycodone  to 2.5 mg every 8 hours as needed for severe pain -Reorientation and delirium precaution -Minimize avoid sedating medications   Essential hypertension: BP within acceptable range. Continue amlodipine .  Was on losartan  at home   Acute kidney injury: Resolved    Hypothyroidism -Continue Synthroid .     Leukocytosis: Demargination?  No signs of infection.  Stable.   Physical debilitation muscle weakness -PT/OT recommended SNF.     Nutrition Problem: Moderate Malnutrition Etiology: social / environmental circumstances (decline in functional status)    DVT prophylaxis:apixaban  (ELIQUIS ) tablet 2.5 mg Start: 04/07/24 1345 Place and maintain sequential compression device Start: 04/06/24 1239 SCDs Start: 03/30/24 2300 apixaban  (ELIQUIS ) tablet 2.5 mg     Code Status: Full Code  Family Communication: Called daughter [name] twice today, call not received  Patient status:Inpatient  Patient is from :SNF  Anticipated discharge to:SNF  Estimated DC date:tomorrow   Consultants: Ortho  Procedures:ORIF  Antimicrobials:  Anti-infectives (From admission,  onward)    Start     Dose/Rate Route Frequency Ordered Stop   03/31/24 1915  ceFAZolin  (ANCEF ) IVPB 2g/100 mL premix        2 g 200 mL/hr over 30 Minutes Intravenous Every 8 hours 03/31/24 1333 04/01/24 1403   03/31/24 1032  ceFAZolin  (ANCEF ) 2-4 GM/100ML-% IVPB       Note to Pharmacy: Ezequiel Henri: cabinet override      03/31/24 1032 03/31/24 1127   03/31/24 1030  ceFAZolin  (ANCEF ) IVPB 2g/100 mL premix        2 g 200 mL/hr over 30 Minutes Intravenous On call to O.R. 03/31/24 1029 03/31/24 1123       Subjective: Patient seen and examined at the bedside today.  She was in moderate distress this morning and was complaining of abdominal discomfort.  She complained of generalized abdominal pain.  Abdomen was mostly soft and nondistended.  She also had a bowel movement.  We discussed about doing a CT scan of the abdomen. Patient again checked in the afternoon.  Her abdominal pain has resolved.  Report of CT imaging still pending  Objective: Vitals:   04/11/24 2020 04/12/24 0447 04/12/24 0723 04/12/24 1332  BP: (!) 129/41 (!) 114/47 (!) 132/48 (!) 102/45  Pulse: 76 68 78 67  Resp: 19 18 17 16   Temp: 98 F (36.7 C) 98 F (36.7 C) 98.2 F (36.8 C) 98.1 F (36.7 C)  TempSrc:  Oral Oral Oral  SpO2: 100% 100% 100% 99%  Weight:      Height:        Intake/Output Summary (Last 24 hours) at 04/12/2024 1622 Last data filed at 04/12/2024 1500 Gross per 24 hour  Intake 360 ml  Output 450 ml  Net -90 ml   Filed Weights   03/30/24 1526 03/31/24 1020  Weight: 58.5 kg 58.5 kg    Examination:  General exam: Overall comfortable, not in distress, frail/deconditioned HEENT: PERRL Respiratory system:  no wheezes or crackles  Cardiovascular system: S1 & S2 heard, RRR.  Gastrointestinal system: Abdomen is nondistended, soft and mostly nontender. Central nervous system: Alert and oriented Extremities: No edema, no clubbing ,no cyanosis, surgical wound on the right hip Skin: No  rashes, no ulcers,no icterus     Data Reviewed: I have personally reviewed following labs and imaging studies  CBC: Recent Labs  Lab 04/06/24 1133 04/06/24 2314 04/07/24 0550 04/08/24 0429 04/09/24 0500 04/10/24 0513  WBC 12.6*  --  10.7* 13.2* 12.4* 12.9*  HGB 6.9* 8.7* 9.1* 10.8* 9.4* 11.3*  HCT 20.7* 25.8* 26.8* 32.1* 27.6* 35.1*  MCV 92.8  --  91.5 94.7 93.9 96.7  PLT 357  --  357 427* 416* 471*   Basic Metabolic Panel: Recent Labs  Lab 04/07/24 0550 04/08/24 0429 04/08/24 1235 04/09/24 0500 04/10/24 0513  NA 133* 128* 127* 128* 135  K 4.5 5.3* 4.3 4.5 4.1  CL 100 97* 98 99 104  CO2 25 21* 22 23 20*  GLUCOSE 224* 296* 307* 184* 109*  BUN 13 22 23 19 18   CREATININE 0.86 0.96 0.98 0.94 0.80  CALCIUM 8.4* 8.7* 8.4* 8.3* 8.7*  MG 2.1 2.3  --  2.2 2.2  PHOS 3.3 3.9 2.9 3.3 4.2     No results found for this or any previous visit (from the past 240 hours).   Radiology Studies: No results found.  Scheduled Meds:  (feeding supplement) PROSource Plus  30 mL Oral BID BM  acetaminophen   650 mg Oral Q6H WA   amLODipine   5 mg Oral Daily   apixaban   2.5 mg Oral BID   brimonidine   1 drop Left Eye BID   cholecalciferol   1,000 Units Oral Daily   dorzolamide -timolol   1 drop Left Eye BID   Fe Fum-Vit C-Vit B12-FA  1 capsule Oral BID   feeding supplement (GLUCERNA SHAKE)  237 mL Oral TID BM   insulin  aspart  0-15 Units Subcutaneous TID WC   insulin  glargine-yfgn  20 Units Subcutaneous Daily   levothyroxine   100 mcg Oral Q0600   multivitamin with minerals  1 tablet Oral Daily   pravastatin   40 mg Oral q1800   Continuous Infusions:  dextrose  5% lactated ringers        LOS: 13 days   Ivonne Mustache, MD [facility] P7/06/2024, 4:22 PM

## 2024-04-12 NOTE — TOC Transition Note (Addendum)
 Transition of Care [facility]) - Discharge Note   Patient Details  Name: Jennifer Bailey MRN: [id number] Date of Birth: 07-18-1935  [facility] CM/SW Contact:  Bridget Cordella Simmonds, LCSW Phone Number: 04/12/2024, 3:40 PM   Clinical Narrative: Pt discharging to [place], room 129b.  RN call report to [phone].  Pt has been placed on will call status with [facility].  Please call them at [phone] when you are ready for pt to be in line for pickup.   1000: CSW message with [facility] confirming they can receive pt today.  Pt needs CT scan prior to DC. 1500: TC [facility], updated her that scan is not complete but MD still anticipates DC today.     1605: TC daughter [name], updated her on the above.  They are on the way to the hospital.     Final next level of care: Skilled Nursing Facility Barriers to Discharge: Barriers Resolved   Patient Goals and CMS Choice            Discharge Placement              Patient chooses bed at: [facility] Patient to be transferred to facility by: [facility] Name of family member notified: left message with daughter [name] Patient and family notified of of transfer: 04/12/24  Discharge Plan and Services Additional resources added to the After Visit Summary for                                       Social Drivers of Health (SDOH) Interventions SDOH Screenings   Food Insecurity: No Food Insecurity (03/31/2024)  Housing: Low Risk  (03/31/2024)  Transportation Needs: No Transportation Needs (03/31/2024)  Utilities: Not At Risk (03/31/2024)  Depression (PHQ2-9): Low Risk  (02/07/2024)  Financial Resource Strain: Low Risk  (02/16/2023)  Physical Activity: Sufficiently Active (02/16/2023)  Social Connections: Moderately Isolated (03/31/2024)  Stress: No Stress Concern Present (02/16/2023)  Tobacco Use: Low Risk  (03/31/2024)     Readmission Risk Interventions     No data to display

## 2024-04-12 NOTE — Progress Notes (Signed)
 Nutrition Follow Up  DOCUMENTATION CODES:   Non-severe (moderate) malnutrition in context of social or environmental circumstances (decline in functional status)  INTERVENTION:  Encourage PO intake Encourage increased protein intake Room service with assist  Continue Glucerna Shake po TID, each supplement provides 220 kcal and 10 grams of protein Continue Prosource Plus BID, each supplement contains 100 kcal and 15 gm protein  Continue MVI with minerals daily and 1,000 units vitamin D  daily  High calorie, high protein handout provided in AVS  NUTRITION DIAGNOSIS:   Moderate Malnutrition related to social / environmental circumstances (decline in functional status) as evidenced by moderate muscle depletion, mild fat depletion. Remains applicable  GOAL:   Patient will meet greater than or equal to 90% of their needs Progressing  MONITOR:   PO intake, Supplement acceptance  REASON FOR ASSESSMENT:   Consult Assessment of nutrition requirement/status, Hip fracture protocol  ASSESSMENT:  89 y.o. female with PMH of T2DM, HTN, HLD, hypothyroidism, [place] her. Found to have Right hip fracture s/p ORIF 6/27/025.  6/26 - Admitted  6/27 - s/p ORIF of right hip  7/2 - Hgb 6.8, administered 1 unit PRBC 7/3- additional unit PRBC  Pt has bed at SNF and now medically ready for discharge since hemoglobin has increased and stabilized.  Pt reports her appetite has improved since last visit, but reports she did not eat much at breakfast this morning because she wasn't very hungry. Pt reports eating a small breakfast is typical for her but she feels like otherwise her appetite at lunch and dinner has increased. Pt has average intake of 49% over last 8 documented meals but also reports drinking Glucerna shakes now. Pt reports no further nutrition concerns, discussed continuing to eat adequate calories and protein when discharged and encouraged High  Calorie, High protein items to help intake, pt agreeable.   Admit weight: 58.5 kg Current weight: 58.5 kg    Average Meal Intake: 5/28-7/3: 47% intake x 8 recorded meals 7/4-7/9: 49% average intake x 8 recorded meals  Nutritionally Relevant Medications: Vitamin D3 Trigels-F Forte Novolog  SSI TID Semglee  15 units daily Levothryoxine  MVI w/ minerals  Labs reviewed:  CBG over last 24 hours: 81-169mg /dL Hgb 88.6<--3.0  Diet Order:   Diet Order             Diet Carb Modified           DIET DYS 3 Room service appropriate? Yes with Assist; Fluid consistency: Thin  Diet effective now           Diet - low sodium heart healthy                   EDUCATION NEEDS:   Education needs have been addressed  Skin:  Skin Assessment: Skin Integrity Issues: Skin Integrity Issues:: Incisions, DTI DTI: R heel Incisions: Surgical incsion, right hip  Last BM:  7/9 type 4  Height:   Ht Readings from Last 1 Encounters:  03/31/24 4' 10 (1.473 m)   Weight:   Wt Readings from Last 1 Encounters:  03/31/24 58.5 kg    BMI:  Body mass index is 26.96 kg/m.  Estimated Nutritional Needs:   Kcal:  1500-1800 kcal  Protein:  80-100 gm  Fluid:  >1.5L/day   Josette Glance, MS, RDN, LDN Clinical Dietitian I Please reach out via secure chat

## 2024-04-13 DIAGNOSIS — R918 Other nonspecific abnormal finding of lung field: Secondary | ICD-10-CM | POA: Diagnosis not present

## 2024-04-13 DIAGNOSIS — L03311 Cellulitis of abdominal wall: Secondary | ICD-10-CM | POA: Diagnosis present

## 2024-04-13 DIAGNOSIS — L89612 Pressure ulcer of right heel, stage 2: Secondary | ICD-10-CM | POA: Diagnosis present

## 2024-04-13 DIAGNOSIS — S72141A Displaced intertrochanteric fracture of right femur, initial encounter for closed fracture: Secondary | ICD-10-CM | POA: Diagnosis not present

## 2024-04-13 DIAGNOSIS — N281 Cyst of kidney, acquired: Secondary | ICD-10-CM | POA: Diagnosis not present

## 2024-04-13 DIAGNOSIS — S7291XD Unspecified fracture of right femur, subsequent encounter for closed fracture with routine healing: Secondary | ICD-10-CM | POA: Diagnosis not present

## 2024-04-13 DIAGNOSIS — G8929 Other chronic pain: Secondary | ICD-10-CM | POA: Diagnosis not present

## 2024-04-13 DIAGNOSIS — I10 Essential (primary) hypertension: Secondary | ICD-10-CM | POA: Diagnosis not present

## 2024-04-13 DIAGNOSIS — Z711 Person with feared health complaint in whom no diagnosis is made: Secondary | ICD-10-CM | POA: Diagnosis not present

## 2024-04-13 DIAGNOSIS — Z66 Do not resuscitate: Secondary | ICD-10-CM | POA: Diagnosis present

## 2024-04-13 DIAGNOSIS — D72829 Elevated white blood cell count, unspecified: Secondary | ICD-10-CM | POA: Diagnosis not present

## 2024-04-13 DIAGNOSIS — R2681 Unsteadiness on feet: Secondary | ICD-10-CM | POA: Diagnosis not present

## 2024-04-13 DIAGNOSIS — E1142 Type 2 diabetes mellitus with diabetic polyneuropathy: Secondary | ICD-10-CM | POA: Diagnosis present

## 2024-04-13 DIAGNOSIS — S40021A Contusion of right upper arm, initial encounter: Secondary | ICD-10-CM | POA: Diagnosis not present

## 2024-04-13 DIAGNOSIS — M6259 Muscle wasting and atrophy, not elsewhere classified, multiple sites: Secondary | ICD-10-CM | POA: Diagnosis not present

## 2024-04-13 DIAGNOSIS — I700 Atherosclerosis of aorta: Secondary | ICD-10-CM | POA: Diagnosis not present

## 2024-04-13 DIAGNOSIS — Z7189 Other specified counseling: Secondary | ICD-10-CM | POA: Diagnosis not present

## 2024-04-13 DIAGNOSIS — R0989 Other specified symptoms and signs involving the circulatory and respiratory systems: Secondary | ICD-10-CM | POA: Diagnosis not present

## 2024-04-13 DIAGNOSIS — F0284 Dementia in other diseases classified elsewhere, unspecified severity, with anxiety: Secondary | ICD-10-CM | POA: Diagnosis present

## 2024-04-13 DIAGNOSIS — R531 Weakness: Secondary | ICD-10-CM | POA: Diagnosis not present

## 2024-04-13 DIAGNOSIS — R410 Disorientation, unspecified: Secondary | ICD-10-CM | POA: Diagnosis present

## 2024-04-13 DIAGNOSIS — G9341 Metabolic encephalopathy: Secondary | ICD-10-CM | POA: Diagnosis present

## 2024-04-13 DIAGNOSIS — L03314 Cellulitis of groin: Secondary | ICD-10-CM | POA: Diagnosis present

## 2024-04-13 DIAGNOSIS — E1110 Type 2 diabetes mellitus with ketoacidosis without coma: Secondary | ICD-10-CM | POA: Diagnosis present

## 2024-04-13 DIAGNOSIS — R41841 Cognitive communication deficit: Secondary | ICD-10-CM | POA: Diagnosis not present

## 2024-04-13 DIAGNOSIS — L89613 Pressure ulcer of right heel, stage 3: Secondary | ICD-10-CM | POA: Diagnosis not present

## 2024-04-13 DIAGNOSIS — D649 Anemia, unspecified: Secondary | ICD-10-CM | POA: Diagnosis not present

## 2024-04-13 DIAGNOSIS — S72001S Fracture of unspecified part of neck of right femur, sequela: Secondary | ICD-10-CM | POA: Diagnosis not present

## 2024-04-13 DIAGNOSIS — M25551 Pain in right hip: Secondary | ICD-10-CM | POA: Diagnosis not present

## 2024-04-13 DIAGNOSIS — R109 Unspecified abdominal pain: Secondary | ICD-10-CM | POA: Diagnosis not present

## 2024-04-13 DIAGNOSIS — M6281 Muscle weakness (generalized): Secondary | ICD-10-CM | POA: Diagnosis not present

## 2024-04-13 DIAGNOSIS — R5383 Other fatigue: Secondary | ICD-10-CM | POA: Diagnosis not present

## 2024-04-13 DIAGNOSIS — Z515 Encounter for palliative care: Secondary | ICD-10-CM | POA: Diagnosis not present

## 2024-04-13 DIAGNOSIS — K5900 Constipation, unspecified: Secondary | ICD-10-CM | POA: Diagnosis not present

## 2024-04-13 DIAGNOSIS — E1169 Type 2 diabetes mellitus with other specified complication: Secondary | ICD-10-CM | POA: Diagnosis present

## 2024-04-13 DIAGNOSIS — R1311 Dysphagia, oral phase: Secondary | ICD-10-CM | POA: Diagnosis not present

## 2024-04-13 DIAGNOSIS — I152 Hypertension secondary to endocrine disorders: Secondary | ICD-10-CM | POA: Diagnosis present

## 2024-04-13 DIAGNOSIS — E063 Autoimmune thyroiditis: Secondary | ICD-10-CM | POA: Diagnosis present

## 2024-04-13 DIAGNOSIS — E119 Type 2 diabetes mellitus without complications: Secondary | ICD-10-CM | POA: Diagnosis not present

## 2024-04-13 DIAGNOSIS — Z794 Long term (current) use of insulin: Secondary | ICD-10-CM | POA: Diagnosis not present

## 2024-04-13 DIAGNOSIS — E11621 Type 2 diabetes mellitus with foot ulcer: Secondary | ICD-10-CM | POA: Diagnosis present

## 2024-04-13 DIAGNOSIS — Z79899 Other long term (current) drug therapy: Secondary | ICD-10-CM | POA: Diagnosis not present

## 2024-04-13 DIAGNOSIS — Z7901 Long term (current) use of anticoagulants: Secondary | ICD-10-CM | POA: Diagnosis not present

## 2024-04-13 DIAGNOSIS — F410 Panic disorder [episodic paroxysmal anxiety] without agoraphobia: Secondary | ICD-10-CM | POA: Diagnosis not present

## 2024-04-13 DIAGNOSIS — F32A Depression, unspecified: Secondary | ICD-10-CM | POA: Diagnosis present

## 2024-04-13 DIAGNOSIS — Z7401 Bed confinement status: Secondary | ICD-10-CM | POA: Diagnosis not present

## 2024-04-13 DIAGNOSIS — X58XXXA Exposure to other specified factors, initial encounter: Secondary | ICD-10-CM | POA: Diagnosis not present

## 2024-04-13 DIAGNOSIS — R739 Hyperglycemia, unspecified: Secondary | ICD-10-CM | POA: Diagnosis not present

## 2024-04-13 DIAGNOSIS — R197 Diarrhea, unspecified: Secondary | ICD-10-CM | POA: Diagnosis not present

## 2024-04-13 DIAGNOSIS — S40022A Contusion of left upper arm, initial encounter: Secondary | ICD-10-CM | POA: Diagnosis not present

## 2024-04-13 DIAGNOSIS — R6520 Severe sepsis without septic shock: Secondary | ICD-10-CM | POA: Diagnosis present

## 2024-04-13 DIAGNOSIS — L89620 Pressure ulcer of left heel, unstageable: Secondary | ICD-10-CM | POA: Diagnosis present

## 2024-04-13 DIAGNOSIS — E871 Hypo-osmolality and hyponatremia: Secondary | ICD-10-CM | POA: Diagnosis not present

## 2024-04-13 DIAGNOSIS — E440 Moderate protein-calorie malnutrition: Secondary | ICD-10-CM | POA: Diagnosis not present

## 2024-04-13 DIAGNOSIS — S72141D Displaced intertrochanteric fracture of right femur, subsequent encounter for closed fracture with routine healing: Secondary | ICD-10-CM | POA: Diagnosis not present

## 2024-04-13 DIAGNOSIS — I7090 Unspecified atherosclerosis: Secondary | ICD-10-CM | POA: Diagnosis not present

## 2024-04-13 DIAGNOSIS — R112 Nausea with vomiting, unspecified: Secondary | ICD-10-CM | POA: Diagnosis not present

## 2024-04-13 DIAGNOSIS — G309 Alzheimer's disease, unspecified: Secondary | ICD-10-CM | POA: Diagnosis present

## 2024-04-13 DIAGNOSIS — E1165 Type 2 diabetes mellitus with hyperglycemia: Secondary | ICD-10-CM | POA: Diagnosis not present

## 2024-04-13 DIAGNOSIS — E785 Hyperlipidemia, unspecified: Secondary | ICD-10-CM | POA: Diagnosis not present

## 2024-04-13 DIAGNOSIS — L0390 Cellulitis, unspecified: Secondary | ICD-10-CM | POA: Diagnosis not present

## 2024-04-13 DIAGNOSIS — K5730 Diverticulosis of large intestine without perforation or abscess without bleeding: Secondary | ICD-10-CM | POA: Diagnosis not present

## 2024-04-13 DIAGNOSIS — F05 Delirium due to known physiological condition: Secondary | ICD-10-CM | POA: Diagnosis present

## 2024-04-13 DIAGNOSIS — Z4789 Encounter for other orthopedic aftercare: Secondary | ICD-10-CM | POA: Diagnosis not present

## 2024-04-13 DIAGNOSIS — K921 Melena: Secondary | ICD-10-CM | POA: Diagnosis present

## 2024-04-13 DIAGNOSIS — Z9181 History of falling: Secondary | ICD-10-CM | POA: Diagnosis not present

## 2024-04-13 DIAGNOSIS — I959 Hypotension, unspecified: Secondary | ICD-10-CM | POA: Diagnosis not present

## 2024-04-13 DIAGNOSIS — L03115 Cellulitis of right lower limb: Secondary | ICD-10-CM | POA: Diagnosis present

## 2024-04-13 DIAGNOSIS — L89611 Pressure ulcer of right heel, stage 1: Secondary | ICD-10-CM | POA: Diagnosis not present

## 2024-04-13 DIAGNOSIS — I739 Peripheral vascular disease, unspecified: Secondary | ICD-10-CM | POA: Diagnosis not present

## 2024-04-13 DIAGNOSIS — R2689 Other abnormalities of gait and mobility: Secondary | ICD-10-CM | POA: Diagnosis not present

## 2024-04-13 DIAGNOSIS — E039 Hypothyroidism, unspecified: Secondary | ICD-10-CM | POA: Diagnosis not present

## 2024-04-13 DIAGNOSIS — R4182 Altered mental status, unspecified: Secondary | ICD-10-CM | POA: Diagnosis not present

## 2024-04-13 DIAGNOSIS — E1159 Type 2 diabetes mellitus with other circulatory complications: Secondary | ICD-10-CM | POA: Diagnosis present

## 2024-04-13 DIAGNOSIS — M85871 Other specified disorders of bone density and structure, right ankle and foot: Secondary | ICD-10-CM | POA: Diagnosis not present

## 2024-04-13 DIAGNOSIS — Z743 Need for continuous supervision: Secondary | ICD-10-CM | POA: Diagnosis not present

## 2024-04-13 DIAGNOSIS — L97519 Non-pressure chronic ulcer of other part of right foot with unspecified severity: Secondary | ICD-10-CM | POA: Diagnosis not present

## 2024-04-13 DIAGNOSIS — A419 Sepsis, unspecified organism: Secondary | ICD-10-CM | POA: Diagnosis present

## 2024-04-13 DIAGNOSIS — M869 Osteomyelitis, unspecified: Secondary | ICD-10-CM | POA: Diagnosis present

## 2024-04-13 DIAGNOSIS — N179 Acute kidney failure, unspecified: Secondary | ICD-10-CM | POA: Diagnosis present

## 2024-04-13 LAB — BASIC METABOLIC PANEL WITH GFR
Anion gap: 10 (ref 5–15)
BUN: 10 mg/dL (ref 8–23)
CO2: 23 mmol/L (ref 22–32)
Calcium: 8.8 mg/dL — ABNORMAL LOW (ref 8.9–10.3)
Chloride: 101 mmol/L (ref 98–111)
Creatinine, Ser: 0.76 mg/dL (ref 0.44–1.00)
GFR, Estimated: >60 mL/min (ref >60)
Glucose, Bld: 286 mg/dL — ABNORMAL HIGH (ref 70–99)
Potassium: 4 mmol/L (ref 3.5–5.1)
Sodium: 134 mmol/L — ABNORMAL LOW (ref 135–145)

## 2024-04-13 LAB — GLUCOSE, CAPILLARY
Glucose-Capillary: 271 mg/dL — ABNORMAL HIGH (ref 70–99)
Glucose-Capillary: 298 mg/dL — ABNORMAL HIGH (ref 70–99)
Glucose-Capillary: 165 mg/dL — ABNORMAL HIGH (ref 70–99)
Glucose-Capillary: 96 mg/dL (ref 70–99)

## 2024-04-13 LAB — CBC
HCT: 32.6 % — ABNORMAL LOW (ref 36.0–46.0)
Hemoglobin: 10.7 g/dL — ABNORMAL LOW (ref 12.0–15.0)
MCH: 30.8 pg (ref 26.0–34.0)
MCHC: 32.8 g/dL (ref 30.0–36.0)
MCV: 93.9 fL (ref 80.0–100.0)
Platelets: 585 K/uL — ABNORMAL HIGH (ref 150–400)
RBC: 3.47 MIL/uL — ABNORMAL LOW (ref 3.87–5.11)
RDW: 14.9 % (ref 11.5–15.5)
WBC: 12 K/uL — ABNORMAL HIGH (ref 4.0–10.5)
nRBC: 0 % (ref 0.0–0.2)

## 2024-04-13 MED ORDER — TAMSULOSIN HCL 0.4 MG PO CAPS
0.4000 mg | ORAL_CAPSULE | Freq: Every day | ORAL | Status: DC
  Administered 2024-04-13: 0.4 mg via ORAL
  Filled 2024-04-13: qty 1

## 2024-04-13 MED ORDER — SENNOSIDES-DOCUSATE SODIUM 8.6-50 MG PO TABS: 1.0000 | ORAL_TABLET | Freq: Two times a day (BID) | ORAL | Status: DC

## 2024-04-13 MED ORDER — POLYETHYLENE GLYCOL 3350 17 G PO PACK: 17.0000 g | PACK | Freq: Two times a day (BID) | ORAL | Status: DC

## 2024-04-13 MED ORDER — CHLORHEXIDINE GLUCONATE CLOTH 2 % EX PADS
6.0000 | MEDICATED_PAD | Freq: Every day | CUTANEOUS | Status: DC
  Administered 2024-04-13: 6 via TOPICAL

## 2024-04-13 MED ORDER — TAMSULOSIN HCL 0.4 MG PO CAPS: 0.4000 mg | ORAL_CAPSULE | Freq: Every day | ORAL | Status: DC

## 2024-04-13 NOTE — TOC Transition Note (Signed)
 Transition of Care [facility]) - Discharge Note   Patient Details  Name: Jennifer Bailey MRN: [id number] Date of Birth: Apr 04, 1935  Transition of Care [facility]) CM/SW Contact:  Bridget Cordella Simmonds, LCSW Phone Number: 04/13/2024, 2:57 PM   Clinical Narrative:   PT discharging to [facility]  RN call report to [phone].    PTAR called [phone].     Final next level of care: Skilled Nursing Facility Barriers to Discharge: Barriers Resolved   Patient Goals and CMS Choice            Discharge Placement              Patient chooses bed at: [facility] Patient to be transferred to facility by: ptar Name of family member notified: grandson, granddaughter in room Patient and family notified of of transfer: 04/13/24  Discharge Plan and Services Additional resources added to the After Visit Summary for                                       Social Drivers of Health (SDOH) Interventions SDOH Screenings   Food Insecurity: No Food Insecurity (03/31/2024)  Housing: Low Risk  (03/31/2024)  Transportation Needs: No Transportation Needs (03/31/2024)  Utilities: Not At Risk (03/31/2024)  Depression (PHQ2-9): Low Risk  (02/07/2024)  Financial Resource Strain: Low Risk  (02/16/2023)  Physical Activity: Sufficiently Active (02/16/2023)  Social Connections: Moderately Isolated (03/31/2024)  Stress: No Stress Concern Present (02/16/2023)  Tobacco Use: Low Risk  (03/31/2024)     Readmission Risk Interventions     No data to display

## 2024-04-13 NOTE — Inpatient Diabetes Management (Signed)
 Inpatient Diabetes Program Recommendations  AACE/ADA: New Consensus Statement on Inpatient Glycemic Control (2015)  Target Ranges:  Prepandial:   less than 140 mg/dL      Peak postprandial:   less than 180 mg/dL (1-2 hours)      Critically ill patients:  140 - 180 mg/dL   Lab Results  Component Value Date   GLUCAP 271 (H) 04/13/2024   HGBA1C 8.9 (H) 03/31/2024    Review of Glycemic Control  Latest Reference Range & Units 04/12/24 06:31 04/12/24 11:30 04/12/24 16:08 04/12/24 17:34 04/12/24 22:18 04/13/24 06:05  Glucose-Capillary 70 - 99 mg/dL 883 (H) 879 (H) 41 (LL) 144 (H) 226 (H) 271 (H)   Diabetes history: DM 2 Outpatient Diabetes medications: Humalog  3-5 units tid meal coverage, Tresiba  11 units, Janumet  50-500 1 tablet bid Current orders for Inpatient glycemic control:  Semglee  20 units Daily Novolog  0-15 units tid   [name] 8.9% on 6/27  Inpatient Diabetes Program Recommendations:    -   Add Novolog  hs scale  Thanks,  Clotilda Bull RN, MSN, BC-ADM Inpatient Diabetes Coordinator Team Pager [phone] (8a-5p)

## 2024-04-13 NOTE — Progress Notes (Signed)
 Patient seen and examined at bedside today.  She looks much comfortable this morning.  She had multiple bowel movement since yesterday.  Bladder scan showed 278 mL of urine.  Yesterday CT scan also showed distention of the bladder.  We are planning to insert the Foley catheter and discharge her to SNF.Started on Flomax .  She is medically stable for discharge today.Dc orders and summary already on placed. I called her daughter [name] and discussed about discharge planning

## 2024-04-13 NOTE — TOC Progression Note (Signed)
 Transition of Care [facility]) - Progression Note    Patient Details  Name: Jennifer Bailey MRN: [id number] Date of Birth: [birth year]  [facility]) CM/SW Contact  Bridget Cordella Simmonds, LCSW Phone Number: 04/13/2024, 2:44 PM  Clinical Narrative:   1000: CSW spoke with Quandra/[facility], informed ready for DC.  Auth expired yesterday but good through today, [place] needing to verify that they can take with auth date expired yesterda.  1300: Message Myrene, still waiting to hear from [facility].  1440: Message Quandra: good to receive pt.      Expected Discharge Plan: Skilled Nursing Facility Barriers to Discharge: Barriers Resolved  Expected Discharge Plan and Services       Living arrangements for the past 2 months: Single Family Home Expected Discharge Date: 04/13/24                                     Social Determinants of Health (SDOH) Interventions SDOH Screenings   Food Insecurity: No Food Insecurity (03/31/2024)  Housing: Low Risk  (03/31/2024)  Transportation Needs: No Transportation Needs (03/31/2024)  Utilities: Not At Risk (03/31/2024)  Depression (PHQ2-9): Low Risk  (02/07/2024)  Financial Resource Strain: Low Risk  (02/16/2023)  Physical Activity: Sufficiently Active (02/16/2023)  Social Connections: Moderately Isolated (03/31/2024)  Stress: No Stress Concern Present (02/16/2023)  Tobacco Use: Low Risk  (03/31/2024)    Readmission Risk Interventions     No data to display

## 2024-04-14 DIAGNOSIS — E119 Type 2 diabetes mellitus without complications: Secondary | ICD-10-CM | POA: Diagnosis not present

## 2024-04-14 DIAGNOSIS — I10 Essential (primary) hypertension: Secondary | ICD-10-CM | POA: Diagnosis not present

## 2024-04-14 DIAGNOSIS — E785 Hyperlipidemia, unspecified: Secondary | ICD-10-CM | POA: Diagnosis not present

## 2024-04-14 DIAGNOSIS — S72141A Displaced intertrochanteric fracture of right femur, initial encounter for closed fracture: Secondary | ICD-10-CM | POA: Diagnosis not present

## 2024-04-14 DIAGNOSIS — E039 Hypothyroidism, unspecified: Secondary | ICD-10-CM | POA: Diagnosis not present

## 2024-04-17 ENCOUNTER — Emergency Department (HOSPITAL_COMMUNITY)

## 2024-04-17 ENCOUNTER — Emergency Department (HOSPITAL_COMMUNITY)
Admission: EM | Admit: 2024-04-17 | Discharge: 2024-04-18 | Disposition: A | Discharge status: Home / Self Care | Source: Skilled Nursing Facility | Attending: Emergency Medicine | Admitting: Emergency Medicine

## 2024-04-17 ENCOUNTER — Encounter (HOSPITAL_COMMUNITY): Payer: Self-pay

## 2024-04-17 DIAGNOSIS — R197 Diarrhea, unspecified: Secondary | ICD-10-CM | POA: Diagnosis not present

## 2024-04-17 DIAGNOSIS — K921 Melena: Secondary | ICD-10-CM | POA: Diagnosis present

## 2024-04-17 DIAGNOSIS — S40022A Contusion of left upper arm, initial encounter: Secondary | ICD-10-CM | POA: Insufficient documentation

## 2024-04-17 DIAGNOSIS — R109 Unspecified abdominal pain: Secondary | ICD-10-CM | POA: Diagnosis not present

## 2024-04-17 DIAGNOSIS — K5900 Constipation, unspecified: Secondary | ICD-10-CM | POA: Diagnosis not present

## 2024-04-17 DIAGNOSIS — Z794 Long term (current) use of insulin: Secondary | ICD-10-CM | POA: Diagnosis not present

## 2024-04-17 DIAGNOSIS — D649 Anemia, unspecified: Secondary | ICD-10-CM | POA: Diagnosis not present

## 2024-04-17 DIAGNOSIS — E871 Hypo-osmolality and hyponatremia: Secondary | ICD-10-CM | POA: Insufficient documentation

## 2024-04-17 DIAGNOSIS — S40021A Contusion of right upper arm, initial encounter: Secondary | ICD-10-CM | POA: Insufficient documentation

## 2024-04-17 DIAGNOSIS — R112 Nausea with vomiting, unspecified: Secondary | ICD-10-CM | POA: Diagnosis not present

## 2024-04-17 DIAGNOSIS — Z79899 Other long term (current) drug therapy: Secondary | ICD-10-CM | POA: Insufficient documentation

## 2024-04-17 DIAGNOSIS — D72829 Elevated white blood cell count, unspecified: Secondary | ICD-10-CM | POA: Diagnosis not present

## 2024-04-17 DIAGNOSIS — N281 Cyst of kidney, acquired: Secondary | ICD-10-CM | POA: Diagnosis not present

## 2024-04-17 DIAGNOSIS — K5730 Diverticulosis of large intestine without perforation or abscess without bleeding: Secondary | ICD-10-CM | POA: Diagnosis not present

## 2024-04-17 DIAGNOSIS — Z7901 Long term (current) use of anticoagulants: Secondary | ICD-10-CM | POA: Insufficient documentation

## 2024-04-17 DIAGNOSIS — X58XXXA Exposure to other specified factors, initial encounter: Secondary | ICD-10-CM | POA: Insufficient documentation

## 2024-04-17 LAB — I-STAT CHEM 8, ED
BUN: 14 mg/dL (ref 8–23)
Calcium, Ion: 1.09 mmol/L — ABNORMAL LOW (ref 1.15–1.40)
Chloride: 95 mmol/L — ABNORMAL LOW (ref 98–111)
Creatinine, Ser: 0.7 mg/dL (ref 0.44–1.00)
Glucose, Bld: 296 mg/dL — ABNORMAL HIGH (ref 70–99)
HCT: 30 % — ABNORMAL LOW (ref 36.0–46.0)
Hemoglobin: 10.2 g/dL — ABNORMAL LOW (ref 12.0–15.0)
Potassium: 4.3 mmol/L (ref 3.5–5.1)
Sodium: 128 mmol/L — ABNORMAL LOW (ref 135–145)
TCO2: 22 mmol/L (ref 22–32)

## 2024-04-17 LAB — CBC WITH DIFFERENTIAL/PLATELET
Abs Immature Granulocytes: 0.13 K/uL — ABNORMAL HIGH (ref 0.00–0.07)
Basophils Absolute: 0.1 K/uL (ref 0.0–0.1)
Basophils Relative: 0 %
Eosinophils Absolute: 0.1 K/uL (ref 0.0–0.5)
Eosinophils Relative: 1 %
HCT: 29.3 % — ABNORMAL LOW (ref 36.0–46.0)
Hemoglobin: 10 g/dL — ABNORMAL LOW (ref 12.0–15.0)
Immature Granulocytes: 1 %
Lymphocytes Relative: 7 %
Lymphs Abs: 1 K/uL (ref 0.7–4.0)
MCH: 31.7 pg (ref 26.0–34.0)
MCHC: 34.1 g/dL (ref 30.0–36.0)
MCV: 93 fL (ref 80.0–100.0)
Monocytes Absolute: 1 K/uL (ref 0.1–1.0)
Monocytes Relative: 8 %
Neutro Abs: 11.2 K/uL — ABNORMAL HIGH (ref 1.7–7.7)
Neutrophils Relative %: 83 %
Platelets: 501 K/uL — ABNORMAL HIGH (ref 150–400)
RBC: 3.15 MIL/uL — ABNORMAL LOW (ref 3.87–5.11)
RDW: 14.2 % (ref 11.5–15.5)
WBC: 13.4 K/uL — ABNORMAL HIGH (ref 4.0–10.5)
nRBC: 0 % (ref 0.0–0.2)

## 2024-04-17 LAB — TYPE AND SCREEN
ABO/RH(D): O POS
Antibody Screen: NEGATIVE

## 2024-04-17 LAB — COMPREHENSIVE METABOLIC PANEL WITH GFR
ALT: 14 U/L (ref 0–44)
AST: 21 U/L (ref 15–41)
Albumin: 2.9 g/dL — ABNORMAL LOW (ref 3.5–5.0)
Alkaline Phosphatase: 166 U/L — ABNORMAL HIGH (ref 38–126)
Anion gap: 10 (ref 5–15)
BUN: 12 mg/dL (ref 8–23)
CO2: 21 mmol/L — ABNORMAL LOW (ref 22–32)
Calcium: 8.4 mg/dL — ABNORMAL LOW (ref 8.9–10.3)
Chloride: 94 mmol/L — ABNORMAL LOW (ref 98–111)
Creatinine, Ser: 0.77 mg/dL (ref 0.44–1.00)
GFR, Estimated: >60 mL/min (ref >60)
Glucose, Bld: 283 mg/dL — ABNORMAL HIGH (ref 70–99)
Potassium: 4.1 mmol/L (ref 3.5–5.1)
Sodium: 125 mmol/L — ABNORMAL LOW (ref 135–145)
Total Bilirubin: 1.3 mg/dL — ABNORMAL HIGH (ref 0.0–1.2)
Total Protein: 6 g/dL — ABNORMAL LOW (ref 6.5–8.1)

## 2024-04-17 LAB — POC OCCULT BLOOD, ED: Fecal Occult Bld: NEGATIVE

## 2024-04-17 MED ORDER — FENTANYL CITRATE PF 50 MCG/ML IJ SOSY
50.0000 ug | PREFILLED_SYRINGE | Freq: Once | INTRAMUSCULAR | Status: AC
  Administered 2024-04-17: 50 ug via INTRAVENOUS
  Filled 2024-04-17: qty 1

## 2024-04-17 MED ORDER — SODIUM CHLORIDE 0.9 % IV BOLUS
500.0000 mL | Freq: Once | INTRAVENOUS | Status: AC
  Administered 2024-04-17: 500 mL via INTRAVENOUS

## 2024-04-17 MED ORDER — IOHEXOL 350 MG/ML SOLN
75.0000 mL | Freq: Once | INTRAVENOUS | Status: AC | PRN
  Administered 2024-04-17: 75 mL via INTRAVENOUS

## 2024-04-17 NOTE — ED Notes (Signed)
 MD aware of MAP 63.

## 2024-04-17 NOTE — ED Notes (Signed)
 Family at bedside.

## 2024-04-17 NOTE — ED Notes (Signed)
 CCMD called.

## 2024-04-17 NOTE — ED Triage Notes (Signed)
 Pt bib EMS from SNF for black stools and abnormal labs. Per EMS pt had critically low Na+. Unsure of value. Pt is alert to voice and oriented to person, place, and situation. GCS 13. Denies pain. VSS. Pt on continuous cardiac monitor and pulse ox.

## 2024-04-17 NOTE — ED Notes (Signed)
 Ptar to [employer]

## 2024-04-17 NOTE — ED Notes (Addendum)
 Attempted to call the facility x1; facility states they will call back

## 2024-04-17 NOTE — ED Provider Notes (Signed)
 [place] EMERGENCY DEPARTMENT AT [name] Provider Note   CSN: [id number] Arrival date & time: 04/17/24  1420     Patient presents with: Melena   Jennifer Bailey is a 89 y.o. female.  She has sent in from her rehab facility [facility] for dark stools and low sodium.  Level 5 caveat secondary to patient confusion.  It looks like she was just discharged from the hospital after being admitted for a right hip fracture.  Complicated by anemia and was transfused.  Had urinary retention and had a Foley placed.  Was noted to be confused at that time.  Patient is not sure why she is here but she denies any complaints.  She does endorse that she did have a dark bowel movement yesterday.  She is not sure if she had one today  {Add pertinent medical, surgical, social history, OB history to YEP:67052} The history is provided by the patient and the nursing home.       Prior to Admission medications   Medication Sig Start Date End Date Taking? Authorizing Provider  Alcohol  Swabs  (ALCOHOL  PADS) 70 % PADS USE 4 PER DAY 11/14/14   Von Pacific, MD  alendronate  (FOSAMAX ) 70 MG tablet Take with a full glass of water on an empty stomach. Patient taking differently: Take 70 mg by mouth once a week. Take with a full glass of water on an empty stomach. 09/24/23   Thapa, [place], MD  amLODipine  (NORVASC ) 5 MG tablet Take 1 tablet (5 mg total) by mouth daily. 04/12/24   Jillian Buttery, MD  apixaban  (ELIQUIS ) 2.5 MG TABS tablet Take 1 tablet (2.5 mg total) by mouth 2 (two) times daily. 04/03/24 05/03/24  Danton Lauraine LABOR, PA-C  BD PEN NEEDLE NANO U/F 32G X 4 MM MISC USE AS DIRECTED FIVE TIMES A DAY. 03/03/16   Von Pacific, MD  brimonidine  (ALPHAGAN ) 0.2 % ophthalmic solution Place 1 drop into the left eye 2 (two) times daily. 02/29/24   [provider]  cholecalciferol  (CHOLECALCIFEROL ) 25 MCG tablet Take 1 tablet (1,000 Units total) by mouth daily. 04/04/24 07/03/24  Danton Lauraine LABOR, PA-C   Continuous Blood Gluc Receiver (FREESTYLE LIBRE 14 DAY READER) DEVI USE DAILY TO CHECK BLOOD SUGAR 09/24/20   Von Pacific, MD  Continuous Glucose Sensor (FREESTYLE LIBRE 2 SENSOR) MISC APPLY TO ARM EVERY 14 DAYS TO MONITOR BLOOD SUGAR. 10/20/23   Thapa, [place], MD  dorzolamidel-timolol  (COSOPT ) 22.3-6.8 MG/ML SOLN ophthalmic solution Place 1 drop into the left eye 2 (two) times daily.    [provider]  feeding supplement, GLUCERNA SHAKE, (GLUCERNA SHAKE) LIQD Take 237 mLs by mouth 3 (three) times daily between meals. 04/06/24   Gonfa, Taye T, MD  furosemide  (LASIX ) 20 MG tablet Take 1 tablet by mouth every other day. Patient taking differently: Take 20 mg by mouth daily as needed for fluid. 01/27/22   Abonza, Maritza, PA-C  gabapentin  (NEURONTIN ) 100 MG capsule TAKE 4 CAPSULES (400 MG TOTAL) BY MOUTH AT BEDTIME. Patient taking differently: Take 300 mg by mouth at bedtime. 01/14/24   Thapa, [place], MD  Glucagon  (GVOKE HYPOPEN  1-PACK) 1 MG/0.2ML SOAJ Inject 1 mg into the skin as needed (low blood sugar with impaired consciousness). 06/21/23   Thapa, [place], MD  glucose blood (ONETOUCH ULTRA) test strip Use to check blood sugar once a day if needed 03/04/22   Von Pacific, MD  insulin  aspart (NOVOLOG ) 100 UNIT/ML injection Inject 0-15 Units into the skin 3 (three) times daily  with meals. CBG 70 - 120: 0 units CBG 121 - 150: 2 units CBG 151 - 200: 3 units CBG 201 - 250: 5 units CBG 251 - 300: 8 units CBG 301 - 350: 11 units CBG 351 - 400: 15 units CBG > 400: call MD and obtain STAT lab verification 04/06/24   Kathrin Mignon DASEN, MD  insulin  degludec (TRESIBA  FLEXTOUCH) 100 UNIT/ML FlexTouch Pen Inject 15 Units into the skin daily. 04/06/24   Gonfa, Taye T, MD  insulin  lispro (HUMALOG ) 100 UNIT/ML KwikPen INJECT 3 TO 5 UNITS SUBCUTANEOUSLY BEFORE MEALS AS DIRECTED. Patient taking differently: Inject 2-3 Units into the skin 3 (three) times daily. INJECT 3 TO 5 UNITS SUBCUTANEOUSLY BEFORE MEALS AS DIRECTED. 02/07/24    Early, Sara E, NP  levothyroxine  (SYNTHROID ) 100 MCG tablet TAKE 1 TABLET (100 MCG TOTAL) BY MOUTH DAILY BEFORE BREAKFAST. 01/14/24   Thapa, [place], MD  lovastatin  (MEVACOR ) 40 MG tablet TAKE 1 TABLET BY MOUTH ONCE DAILY WITH A MEAL. 01/14/24   Early, Sara E, NP  Multiple Vitamins-Minerals (PRESERVISION AREDS 2) CAPS Take 1 capsule by mouth 2 (two) times daily.    [provider]  Nutritional Supplements (,FEEDING SUPPLEMENT, PROSOURCE PLUS) liquid Take 30 mLs by mouth 2 (two) times daily between meals. 04/06/24   Gonfa, Taye T, MD  Omega-3 1000 MG CAPS Take 2 capsules by mouth 2 (two) times daily.    [provider]  [facility] VERIO test strip USE TO TEST BLOOD SUGAR 4 TIMES DAILY 09/25/20   Von Pacific, MD  oxyCODONE  (OXY IR/ROXICODONE ) 5 MG immediate release tablet Take 0.5 tablets (2.5 mg total) by mouth 3 (three) times daily as needed for severe pain (pain score 7-10). 04/12/24   Adhikari, Amrit, MD  polyethylene glycol (MIRALAX  / GLYCOLAX ) 17 g packet Take 17 g by mouth 2 (two) times daily. 04/13/24   Jillian Buttery, MD  senna-docusate (SENOKOT-S) 8.6-50 MG tablet Take 1 tablet by mouth 2 (two) times daily. 04/13/24   Adhikari, Amrit, MD  sitaGLIPtin-metformin (JANUMET ) 50-500 MG tablet Take 1 tablet by mouth 2 (two) times daily with a meal. 12/17/23   Early, Camie BRAVO, NP  tamsulosin  (FLOMAX ) 0.4 MG CAPS capsule Take 1 capsule (0.4 mg total) by mouth daily. 04/14/24   Jillian Buttery, MD  traZODone  (DESYREL ) 50 MG tablet Take 0.5-1 tablets (25-50 mg total) by mouth at bedtime as needed for sleep. 02/07/24   Early, Sara E, NP    Allergies: Macrodantin [nitrofurantoin macrocrystal], Prednisone, and Penicillins    Review of Systems  Unable to perform ROS: Mental status change    Updated Vital Signs BP (!) 136/55   Pulse 85   Temp 98.3 F (36.8 C) (Oral)   Resp 15   Ht 4' 10 (1.473 m)   Wt 58.5 kg   SpO2 100%   BMI 26.95 kg/m   Physical Exam Vitals and nursing note reviewed.   Constitutional:      General: She is not in acute distress.    Appearance: Normal appearance. She is well-developed.  HENT:     Head: Normocephalic and atraumatic.  Eyes:     Conjunctiva/sclera: Conjunctivae normal.  Cardiovascular:     Rate and Rhythm: Normal rate and regular rhythm.     Heart sounds: No murmur heard. Pulmonary:     Effort: Pulmonary effort is normal. No respiratory distress.     Breath sounds: Normal breath sounds. No stridor. No wheezing.  Abdominal:     Palpations: Abdomen is soft.  Tenderness: There is no abdominal tenderness. There is no guarding or rebound.  Musculoskeletal:        General: Tenderness present.     Cervical back: Neck supple.     Comments: She has a healing surgical scar over her right lateral thigh.  She has extensive bruising of both of her arms.  Skin:    General: Skin is warm and dry.     Findings: Bruising present.  Neurological:     General: No focal deficit present.     Mental Status: She is alert.     GCS: GCS eye subscore is 4. GCS verbal subscore is 5. GCS motor subscore is 6.     Motor: No weakness.     (all labs ordered are listed, but only abnormal results are displayed) Labs Reviewed  CBC WITH DIFFERENTIAL/PLATELET  COMPREHENSIVE METABOLIC PANEL WITH GFR  I-STAT CHEM 8, ED  TYPE AND SCREEN    EKG: None  Radiology: No results found.  {Document cardiac monitor, telemetry assessment procedure when appropriate:32947} Procedures   Medications Ordered in the ED - No data to display    {Click here for ABCD2, HEART and other calculators REFRESH Note before signing:1}                              Medical Decision Making  This patient complains of ***; this involves an extensive number of treatment Options and is a complaint that carries with it a high risk of complications and morbidity. The differential includes ***  I ordered, reviewed and interpreted labs, which included *** I ordered medication *** and  reviewed PMP when indicated. I ordered imaging studies which included *** and I independently    visualized and interpreted imaging which showed *** Additional history obtained from *** Previous records obtained and reviewed *** I consulted *** and discussed lab and imaging findings and discussed disposition.  Cardiac monitoring reviewed, *** Social determinants considered, *** Critical Interventions: ***  After the interventions stated above, I reevaluated the patient and found *** Admission and further testing considered, ***   {Document critical care time when appropriate  Document review of labs and clinical decision tools ie CHADS2VASC2, etc  Document your independent review of radiology images and any outside records  Document your discussion with family members, caretakers and with consultants  Document social determinants of health affecting pt's care  Document your decision making why or why not admission, treatments were needed:32947:::1}   Final diagnoses:  None    ED Discharge Orders     None

## 2024-04-17 NOTE — Discharge Instructions (Addendum)
 You were seen in the emergency department for low sodium and dark stools.  Your hemoglobin was fairly stable and your stools were negative for signs of bleeding.  You had a CAT scan that did not show any acute abnormalities of your abdomen.  your sodium was low at 125 and you were given some fluids.  Your sodium will need to be rechecked in the next few days to make sure it is improving.  Likely is a reflection of the fluids you had while you were admitted and not eating much.  Return to the emergency department if any worsening or concerning symptoms

## 2024-04-17 NOTE — ED Notes (Signed)
 Pt cleaned up after large black tarry stool. Fresh brief and linen change performed. Peri-care performed. Pt provided warm blankets, call bell within reach. Stretcher locked and in lowest position, side rails up x2.

## 2024-04-17 NOTE — ED Notes (Signed)
 Attempted to give report x2; facility states they will call back

## 2024-04-19 DIAGNOSIS — R2681 Unsteadiness on feet: Secondary | ICD-10-CM | POA: Diagnosis not present

## 2024-04-19 DIAGNOSIS — R2689 Other abnormalities of gait and mobility: Secondary | ICD-10-CM | POA: Diagnosis not present

## 2024-04-19 DIAGNOSIS — R1311 Dysphagia, oral phase: Secondary | ICD-10-CM | POA: Diagnosis not present

## 2024-04-19 DIAGNOSIS — Z4789 Encounter for other orthopedic aftercare: Secondary | ICD-10-CM | POA: Diagnosis not present

## 2024-04-19 DIAGNOSIS — M6281 Muscle weakness (generalized): Secondary | ICD-10-CM | POA: Diagnosis not present

## 2024-04-19 DIAGNOSIS — Z9181 History of falling: Secondary | ICD-10-CM | POA: Diagnosis not present

## 2024-04-19 DIAGNOSIS — E871 Hypo-osmolality and hyponatremia: Secondary | ICD-10-CM | POA: Diagnosis not present

## 2024-04-19 DIAGNOSIS — S7291XD Unspecified fracture of right femur, subsequent encounter for closed fracture with routine healing: Secondary | ICD-10-CM | POA: Diagnosis not present

## 2024-04-19 DIAGNOSIS — S72141A Displaced intertrochanteric fracture of right femur, initial encounter for closed fracture: Secondary | ICD-10-CM | POA: Diagnosis not present

## 2024-04-19 DIAGNOSIS — E440 Moderate protein-calorie malnutrition: Secondary | ICD-10-CM | POA: Diagnosis not present

## 2024-04-24 ENCOUNTER — Ambulatory Visit: Admitting: Podiatry

## 2024-04-25 DIAGNOSIS — L89612 Pressure ulcer of right heel, stage 2: Secondary | ICD-10-CM | POA: Diagnosis not present

## 2024-04-26 DIAGNOSIS — Z4789 Encounter for other orthopedic aftercare: Secondary | ICD-10-CM | POA: Diagnosis not present

## 2024-04-26 DIAGNOSIS — R2681 Unsteadiness on feet: Secondary | ICD-10-CM | POA: Diagnosis not present

## 2024-04-26 DIAGNOSIS — E440 Moderate protein-calorie malnutrition: Secondary | ICD-10-CM | POA: Diagnosis not present

## 2024-04-26 DIAGNOSIS — M6281 Muscle weakness (generalized): Secondary | ICD-10-CM | POA: Diagnosis not present

## 2024-04-26 DIAGNOSIS — Z9181 History of falling: Secondary | ICD-10-CM | POA: Diagnosis not present

## 2024-04-26 DIAGNOSIS — R1311 Dysphagia, oral phase: Secondary | ICD-10-CM | POA: Diagnosis not present

## 2024-04-26 DIAGNOSIS — S7291XD Unspecified fracture of right femur, subsequent encounter for closed fracture with routine healing: Secondary | ICD-10-CM | POA: Diagnosis not present

## 2024-04-26 DIAGNOSIS — R2689 Other abnormalities of gait and mobility: Secondary | ICD-10-CM | POA: Diagnosis not present

## 2024-05-01 ENCOUNTER — Telehealth: Payer: Self-pay

## 2024-05-01 DIAGNOSIS — L89613 Pressure ulcer of right heel, stage 3: Secondary | ICD-10-CM | POA: Diagnosis not present

## 2024-05-01 DIAGNOSIS — E039 Hypothyroidism, unspecified: Secondary | ICD-10-CM | POA: Diagnosis not present

## 2024-05-01 DIAGNOSIS — I10 Essential (primary) hypertension: Secondary | ICD-10-CM | POA: Diagnosis not present

## 2024-05-01 DIAGNOSIS — E119 Type 2 diabetes mellitus without complications: Secondary | ICD-10-CM | POA: Diagnosis not present

## 2024-05-01 DIAGNOSIS — N179 Acute kidney failure, unspecified: Secondary | ICD-10-CM | POA: Diagnosis not present

## 2024-05-01 DIAGNOSIS — S72141A Displaced intertrochanteric fracture of right femur, initial encounter for closed fracture: Secondary | ICD-10-CM | POA: Diagnosis not present

## 2024-05-01 DIAGNOSIS — L89611 Pressure ulcer of right heel, stage 1: Secondary | ICD-10-CM | POA: Diagnosis not present

## 2024-05-01 DIAGNOSIS — L89612 Pressure ulcer of right heel, stage 2: Secondary | ICD-10-CM | POA: Diagnosis not present

## 2024-05-01 NOTE — Telephone Encounter (Signed)
 Pt. Daughter aware ok to stop fish oil. She will let them know at the place she stays at.   Copied from CRM [id number]. Topic: Clinical - Medical Advice >> May 01, 2024  8:38 AM Thersia BROCKS wrote: Reason for CRM: Patient daugther, [name] [place] called in stated that she has been taking fish oil and they have been squashing up and she doesnt like it, want to know if she can stop taking that or if that's something that she has to take , would like a callback  [phone]

## 2024-05-02 DIAGNOSIS — E119 Type 2 diabetes mellitus without complications: Secondary | ICD-10-CM | POA: Diagnosis not present

## 2024-05-02 DIAGNOSIS — S72141D Displaced intertrochanteric fracture of right femur, subsequent encounter for closed fracture with routine healing: Secondary | ICD-10-CM | POA: Diagnosis not present

## 2024-05-03 DIAGNOSIS — E440 Moderate protein-calorie malnutrition: Secondary | ICD-10-CM | POA: Diagnosis not present

## 2024-05-03 DIAGNOSIS — M6281 Muscle weakness (generalized): Secondary | ICD-10-CM | POA: Diagnosis not present

## 2024-05-03 DIAGNOSIS — R1311 Dysphagia, oral phase: Secondary | ICD-10-CM | POA: Diagnosis not present

## 2024-05-03 DIAGNOSIS — R2689 Other abnormalities of gait and mobility: Secondary | ICD-10-CM | POA: Diagnosis not present

## 2024-05-03 DIAGNOSIS — R2681 Unsteadiness on feet: Secondary | ICD-10-CM | POA: Diagnosis not present

## 2024-05-03 DIAGNOSIS — Z9181 History of falling: Secondary | ICD-10-CM | POA: Diagnosis not present

## 2024-05-03 DIAGNOSIS — S7291XD Unspecified fracture of right femur, subsequent encounter for closed fracture with routine healing: Secondary | ICD-10-CM | POA: Diagnosis not present

## 2024-05-03 DIAGNOSIS — Z4789 Encounter for other orthopedic aftercare: Secondary | ICD-10-CM | POA: Diagnosis not present

## 2024-05-04 DIAGNOSIS — E119 Type 2 diabetes mellitus without complications: Secondary | ICD-10-CM | POA: Diagnosis not present

## 2024-05-09 DIAGNOSIS — R5383 Other fatigue: Secondary | ICD-10-CM | POA: Diagnosis not present

## 2024-05-09 DIAGNOSIS — L89612 Pressure ulcer of right heel, stage 2: Secondary | ICD-10-CM | POA: Diagnosis not present

## 2024-05-09 DIAGNOSIS — G8929 Other chronic pain: Secondary | ICD-10-CM | POA: Diagnosis not present

## 2024-05-10 DIAGNOSIS — E440 Moderate protein-calorie malnutrition: Secondary | ICD-10-CM | POA: Diagnosis not present

## 2024-05-10 DIAGNOSIS — L89613 Pressure ulcer of right heel, stage 3: Secondary | ICD-10-CM | POA: Diagnosis not present

## 2024-05-10 DIAGNOSIS — M6281 Muscle weakness (generalized): Secondary | ICD-10-CM | POA: Diagnosis not present

## 2024-05-10 DIAGNOSIS — Z4789 Encounter for other orthopedic aftercare: Secondary | ICD-10-CM | POA: Diagnosis not present

## 2024-05-10 DIAGNOSIS — R1311 Dysphagia, oral phase: Secondary | ICD-10-CM | POA: Diagnosis not present

## 2024-05-10 DIAGNOSIS — F410 Panic disorder [episodic paroxysmal anxiety] without agoraphobia: Secondary | ICD-10-CM | POA: Diagnosis not present

## 2024-05-10 DIAGNOSIS — R2689 Other abnormalities of gait and mobility: Secondary | ICD-10-CM | POA: Diagnosis not present

## 2024-05-10 DIAGNOSIS — R2681 Unsteadiness on feet: Secondary | ICD-10-CM | POA: Diagnosis not present

## 2024-05-10 DIAGNOSIS — S7291XD Unspecified fracture of right femur, subsequent encounter for closed fracture with routine healing: Secondary | ICD-10-CM | POA: Diagnosis not present

## 2024-05-10 DIAGNOSIS — Z9181 History of falling: Secondary | ICD-10-CM | POA: Diagnosis not present

## 2024-05-11 DIAGNOSIS — I739 Peripheral vascular disease, unspecified: Secondary | ICD-10-CM | POA: Diagnosis not present

## 2024-05-11 DIAGNOSIS — R0989 Other specified symptoms and signs involving the circulatory and respiratory systems: Secondary | ICD-10-CM | POA: Diagnosis not present

## 2024-05-12 DIAGNOSIS — F410 Panic disorder [episodic paroxysmal anxiety] without agoraphobia: Secondary | ICD-10-CM | POA: Diagnosis not present

## 2024-05-14 ENCOUNTER — Emergency Department (HOSPITAL_COMMUNITY)

## 2024-05-14 ENCOUNTER — Inpatient Hospital Stay (HOSPITAL_COMMUNITY)
Admission: EM | Admit: 2024-05-14 | Discharge: 2024-05-17 | DRG: 871 | Disposition: A | Discharge status: Hospice | Source: Skilled Nursing Facility | Attending: Internal Medicine | Admitting: Internal Medicine

## 2024-05-14 ENCOUNTER — Encounter (HOSPITAL_COMMUNITY): Payer: Self-pay | Admitting: Emergency Medicine

## 2024-05-14 ENCOUNTER — Other Ambulatory Visit: Payer: Self-pay

## 2024-05-14 DIAGNOSIS — F0390 Unspecified dementia without behavioral disturbance: Secondary | ICD-10-CM | POA: Diagnosis present

## 2024-05-14 DIAGNOSIS — F0284 Dementia in other diseases classified elsewhere, unspecified severity, with anxiety: Secondary | ICD-10-CM | POA: Diagnosis present

## 2024-05-14 DIAGNOSIS — E11621 Type 2 diabetes mellitus with foot ulcer: Secondary | ICD-10-CM | POA: Diagnosis present

## 2024-05-14 DIAGNOSIS — I7090 Unspecified atherosclerosis: Secondary | ICD-10-CM | POA: Diagnosis not present

## 2024-05-14 DIAGNOSIS — Z66 Do not resuscitate: Secondary | ICD-10-CM | POA: Diagnosis present

## 2024-05-14 DIAGNOSIS — E1142 Type 2 diabetes mellitus with diabetic polyneuropathy: Secondary | ICD-10-CM | POA: Diagnosis present

## 2024-05-14 DIAGNOSIS — R4182 Altered mental status, unspecified: Secondary | ICD-10-CM | POA: Diagnosis not present

## 2024-05-14 DIAGNOSIS — I10 Essential (primary) hypertension: Secondary | ICD-10-CM | POA: Diagnosis not present

## 2024-05-14 DIAGNOSIS — Z794 Long term (current) use of insulin: Secondary | ICD-10-CM | POA: Diagnosis not present

## 2024-05-14 DIAGNOSIS — Z79899 Other long term (current) drug therapy: Secondary | ICD-10-CM

## 2024-05-14 DIAGNOSIS — L03311 Cellulitis of abdominal wall: Secondary | ICD-10-CM | POA: Diagnosis present

## 2024-05-14 DIAGNOSIS — R6520 Severe sepsis without septic shock: Secondary | ICD-10-CM | POA: Diagnosis present

## 2024-05-14 DIAGNOSIS — L03115 Cellulitis of right lower limb: Secondary | ICD-10-CM | POA: Diagnosis present

## 2024-05-14 DIAGNOSIS — G9341 Metabolic encephalopathy: Secondary | ICD-10-CM | POA: Diagnosis not present

## 2024-05-14 DIAGNOSIS — Z7901 Long term (current) use of anticoagulants: Secondary | ICD-10-CM

## 2024-05-14 DIAGNOSIS — F05 Delirium due to known physiological condition: Secondary | ICD-10-CM | POA: Diagnosis present

## 2024-05-14 DIAGNOSIS — L97513 Non-pressure chronic ulcer of other part of right foot with necrosis of muscle: Secondary | ICD-10-CM

## 2024-05-14 DIAGNOSIS — G309 Alzheimer's disease, unspecified: Secondary | ICD-10-CM | POA: Diagnosis present

## 2024-05-14 DIAGNOSIS — L97519 Non-pressure chronic ulcer of other part of right foot with unspecified severity: Secondary | ICD-10-CM | POA: Diagnosis not present

## 2024-05-14 DIAGNOSIS — A419 Sepsis, unspecified organism: Principal | ICD-10-CM | POA: Diagnosis present

## 2024-05-14 DIAGNOSIS — F32A Depression, unspecified: Secondary | ICD-10-CM | POA: Diagnosis present

## 2024-05-14 DIAGNOSIS — Z7983 Long term (current) use of bisphosphonates: Secondary | ICD-10-CM

## 2024-05-14 DIAGNOSIS — E785 Hyperlipidemia, unspecified: Secondary | ICD-10-CM | POA: Diagnosis present

## 2024-05-14 DIAGNOSIS — K219 Gastro-esophageal reflux disease without esophagitis: Secondary | ICD-10-CM | POA: Diagnosis present

## 2024-05-14 DIAGNOSIS — Z711 Person with feared health complaint in whom no diagnosis is made: Secondary | ICD-10-CM | POA: Diagnosis not present

## 2024-05-14 DIAGNOSIS — E1159 Type 2 diabetes mellitus with other circulatory complications: Secondary | ICD-10-CM | POA: Diagnosis present

## 2024-05-14 DIAGNOSIS — R601 Generalized edema: Secondary | ICD-10-CM | POA: Diagnosis not present

## 2024-05-14 DIAGNOSIS — R918 Other nonspecific abnormal finding of lung field: Secondary | ICD-10-CM | POA: Diagnosis not present

## 2024-05-14 DIAGNOSIS — L89620 Pressure ulcer of left heel, unstageable: Secondary | ICD-10-CM | POA: Diagnosis present

## 2024-05-14 DIAGNOSIS — R600 Localized edema: Secondary | ICD-10-CM | POA: Diagnosis not present

## 2024-05-14 DIAGNOSIS — I152 Hypertension secondary to endocrine disorders: Secondary | ICD-10-CM | POA: Diagnosis present

## 2024-05-14 DIAGNOSIS — Z833 Family history of diabetes mellitus: Secondary | ICD-10-CM

## 2024-05-14 DIAGNOSIS — Z7189 Other specified counseling: Secondary | ICD-10-CM | POA: Diagnosis not present

## 2024-05-14 DIAGNOSIS — L0390 Cellulitis, unspecified: Secondary | ICD-10-CM | POA: Diagnosis not present

## 2024-05-14 DIAGNOSIS — E063 Autoimmune thyroiditis: Secondary | ICD-10-CM | POA: Diagnosis present

## 2024-05-14 DIAGNOSIS — R739 Hyperglycemia, unspecified: Secondary | ICD-10-CM

## 2024-05-14 DIAGNOSIS — L03314 Cellulitis of groin: Secondary | ICD-10-CM | POA: Diagnosis present

## 2024-05-14 DIAGNOSIS — N179 Acute kidney failure, unspecified: Secondary | ICD-10-CM | POA: Diagnosis present

## 2024-05-14 DIAGNOSIS — L89612 Pressure ulcer of right heel, stage 2: Secondary | ICD-10-CM | POA: Diagnosis not present

## 2024-05-14 DIAGNOSIS — Z79890 Hormone replacement therapy (postmenopausal): Secondary | ICD-10-CM

## 2024-05-14 DIAGNOSIS — Z515 Encounter for palliative care: Secondary | ICD-10-CM | POA: Diagnosis not present

## 2024-05-14 DIAGNOSIS — Z9181 History of falling: Secondary | ICD-10-CM

## 2024-05-14 DIAGNOSIS — M869 Osteomyelitis, unspecified: Secondary | ICD-10-CM | POA: Diagnosis present

## 2024-05-14 DIAGNOSIS — M85871 Other specified disorders of bone density and structure, right ankle and foot: Secondary | ICD-10-CM | POA: Diagnosis not present

## 2024-05-14 DIAGNOSIS — M810 Age-related osteoporosis without current pathological fracture: Secondary | ICD-10-CM | POA: Diagnosis present

## 2024-05-14 DIAGNOSIS — E1165 Type 2 diabetes mellitus with hyperglycemia: Secondary | ICD-10-CM | POA: Diagnosis not present

## 2024-05-14 DIAGNOSIS — R410 Disorientation, unspecified: Secondary | ICD-10-CM | POA: Diagnosis present

## 2024-05-14 DIAGNOSIS — E1110 Type 2 diabetes mellitus with ketoacidosis without coma: Secondary | ICD-10-CM | POA: Diagnosis present

## 2024-05-14 DIAGNOSIS — E440 Moderate protein-calorie malnutrition: Secondary | ICD-10-CM | POA: Diagnosis present

## 2024-05-14 DIAGNOSIS — I700 Atherosclerosis of aorta: Secondary | ICD-10-CM | POA: Diagnosis not present

## 2024-05-14 DIAGNOSIS — G9340 Encephalopathy, unspecified: Secondary | ICD-10-CM

## 2024-05-14 DIAGNOSIS — H409 Unspecified glaucoma: Secondary | ICD-10-CM | POA: Diagnosis present

## 2024-05-14 DIAGNOSIS — R531 Weakness: Secondary | ICD-10-CM | POA: Diagnosis not present

## 2024-05-14 DIAGNOSIS — Z90710 Acquired absence of both cervix and uterus: Secondary | ICD-10-CM

## 2024-05-14 DIAGNOSIS — E1169 Type 2 diabetes mellitus with other specified complication: Secondary | ICD-10-CM | POA: Diagnosis present

## 2024-05-14 LAB — C-REACTIVE PROTEIN: CRP: 6.7 mg/dL — ABNORMAL HIGH (ref <1.0)

## 2024-05-14 LAB — COMPREHENSIVE METABOLIC PANEL WITH GFR
ALT: 9 U/L (ref 0–44)
AST: 13 U/L — ABNORMAL LOW (ref 15–41)
Albumin: 2.6 g/dL — ABNORMAL LOW (ref 3.5–5.0)
Alkaline Phosphatase: 137 U/L — ABNORMAL HIGH (ref 38–126)
Anion gap: 16 — ABNORMAL HIGH (ref 5–15)
BUN: 33 mg/dL — ABNORMAL HIGH (ref 8–23)
CO2: 19 mmol/L — ABNORMAL LOW (ref 22–32)
Calcium: 8.7 mg/dL — ABNORMAL LOW (ref 8.9–10.3)
Chloride: 104 mmol/L (ref 98–111)
Creatinine, Ser: 1.55 mg/dL — ABNORMAL HIGH (ref 0.44–1.00)
GFR, Estimated: 32 mL/min — ABNORMAL LOW (ref >60)
Glucose, Bld: 403 mg/dL — ABNORMAL HIGH (ref 70–99)
Potassium: 4.2 mmol/L (ref 3.5–5.1)
Sodium: 139 mmol/L (ref 135–145)
Total Bilirubin: 1.2 mg/dL (ref 0.0–1.2)
Total Protein: 6.1 g/dL — ABNORMAL LOW (ref 6.5–8.1)

## 2024-05-14 LAB — T4, FREE: Free T4: 0.88 ng/dL (ref 0.61–1.12)

## 2024-05-14 LAB — URINALYSIS, W/ REFLEX TO CULTURE (INFECTION SUSPECTED)
Bacteria, UA: NONE SEEN
Bilirubin Urine: NEGATIVE
Glucose, UA: >=500 mg/dL — AB
Hgb urine dipstick: NEGATIVE
Ketones, ur: 80 mg/dL — AB
Leukocytes,Ua: NEGATIVE
Nitrite: NEGATIVE
Protein, ur: 30 mg/dL — AB
Specific Gravity, Urine: 1.015 (ref 1.005–1.030)
pH: 5 (ref 5.0–8.0)

## 2024-05-14 LAB — CBC WITH DIFFERENTIAL/PLATELET
Abs Immature Granulocytes: 0.72 K/uL — ABNORMAL HIGH (ref 0.00–0.07)
Basophils Absolute: 0.1 K/uL (ref 0.0–0.1)
Basophils Relative: 1 %
Eosinophils Absolute: 0.5 K/uL (ref 0.0–0.5)
Eosinophils Relative: 3 %
HCT: 32.1 % — ABNORMAL LOW (ref 36.0–46.0)
Hemoglobin: 10.3 g/dL — ABNORMAL LOW (ref 12.0–15.0)
Immature Granulocytes: 4 %
Lymphocytes Relative: 8 %
Lymphs Abs: 1.4 K/uL (ref 0.7–4.0)
MCH: 30.7 pg (ref 26.0–34.0)
MCHC: 32.1 g/dL (ref 30.0–36.0)
MCV: 95.8 fL (ref 80.0–100.0)
Monocytes Absolute: 1.1 K/uL — ABNORMAL HIGH (ref 0.1–1.0)
Monocytes Relative: 6 %
Neutro Abs: 15.1 K/uL — ABNORMAL HIGH (ref 1.7–7.7)
Neutrophils Relative %: 78 %
Platelets: 428 K/uL — ABNORMAL HIGH (ref 150–400)
RBC: 3.35 MIL/uL — ABNORMAL LOW (ref 3.87–5.11)
RDW: 14.4 % (ref 11.5–15.5)
WBC: 19 K/uL — ABNORMAL HIGH (ref 4.0–10.5)
nRBC: 0 % (ref 0.0–0.2)

## 2024-05-14 LAB — SURGICAL PCR SCREEN
MRSA, PCR: NEGATIVE
Staphylococcus aureus: NEGATIVE

## 2024-05-14 LAB — I-STAT VENOUS BLOOD GAS, ED
Acid-base deficit: 5 mmol/L — ABNORMAL HIGH (ref 0.0–2.0)
Bicarbonate: 19.5 mmol/L — ABNORMAL LOW (ref 20.0–28.0)
Calcium, Ion: 1.16 mmol/L (ref 1.15–1.40)
HCT: 31 % — ABNORMAL LOW (ref 36.0–46.0)
Hemoglobin: 10.5 g/dL — ABNORMAL LOW (ref 12.0–15.0)
O2 Saturation: 99 %
Potassium: 4.1 mmol/L (ref 3.5–5.1)
Sodium: 140 mmol/L (ref 135–145)
TCO2: 20 mmol/L — ABNORMAL LOW (ref 22–32)
pCO2, Ven: 32.5 mmHg — ABNORMAL LOW (ref 44–60)
pH, Ven: 7.386 (ref 7.25–7.43)
pO2, Ven: 153 mmHg — ABNORMAL HIGH (ref 32–45)

## 2024-05-14 LAB — CBG MONITORING, ED
Glucose-Capillary: 294 mg/dL — ABNORMAL HIGH (ref 70–99)
Glucose-Capillary: 306 mg/dL — ABNORMAL HIGH (ref 70–99)

## 2024-05-14 LAB — VITAMIN B12: Vitamin B-12: 565 pg/mL (ref 180–914)

## 2024-05-14 LAB — BASIC METABOLIC PANEL WITH GFR
Anion gap: 18 — ABNORMAL HIGH (ref 5–15)
Anion gap: 12 (ref 5–15)
BUN: 28 mg/dL — ABNORMAL HIGH (ref 8–23)
BUN: 25 mg/dL — ABNORMAL HIGH (ref 8–23)
CO2: 16 mmol/L — ABNORMAL LOW (ref 22–32)
CO2: 19 mmol/L — ABNORMAL LOW (ref 22–32)
Calcium: 8.4 mg/dL — ABNORMAL LOW (ref 8.9–10.3)
Calcium: 8 mg/dL — ABNORMAL LOW (ref 8.9–10.3)
Chloride: 107 mmol/L (ref 98–111)
Chloride: 111 mmol/L (ref 98–111)
Creatinine, Ser: 1.4 mg/dL — ABNORMAL HIGH (ref 0.44–1.00)
Creatinine, Ser: 1.11 mg/dL — ABNORMAL HIGH (ref 0.44–1.00)
GFR, Estimated: 37 mL/min — ABNORMAL LOW (ref >60)
GFR, Estimated: 48 mL/min — ABNORMAL LOW (ref >60)
Glucose, Bld: 369 mg/dL — ABNORMAL HIGH (ref 70–99)
Glucose, Bld: 156 mg/dL — ABNORMAL HIGH (ref 70–99)
Potassium: 4.5 mmol/L (ref 3.5–5.1)
Potassium: 3.2 mmol/L — ABNORMAL LOW (ref 3.5–5.1)
Sodium: 141 mmol/L (ref 135–145)
Sodium: 142 mmol/L (ref 135–145)

## 2024-05-14 LAB — BETA-HYDROXYBUTYRIC ACID
Beta-Hydroxybutyric Acid: 3.9 mmol/L — ABNORMAL HIGH (ref 0.05–0.27)
Beta-Hydroxybutyric Acid: 0.3 mmol/L — ABNORMAL HIGH (ref 0.05–0.27)

## 2024-05-14 LAB — PROTIME-INR
INR: 1.6 — ABNORMAL HIGH (ref 0.8–1.2)
Prothrombin Time: 19.8 s — ABNORMAL HIGH (ref 11.4–15.2)

## 2024-05-14 LAB — I-STAT CG4 LACTIC ACID, ED
Lactic Acid, Venous: 2 mmol/L (ref 0.5–1.9)
Lactic Acid, Venous: 1.7 mmol/L (ref 0.5–1.9)

## 2024-05-14 LAB — TSH: TSH: 5.607 u[IU]/mL — ABNORMAL HIGH (ref 0.350–4.500)

## 2024-05-14 LAB — GLUCOSE, CAPILLARY
Glucose-Capillary: 305 mg/dL — ABNORMAL HIGH (ref 70–99)
Glucose-Capillary: 152 mg/dL — ABNORMAL HIGH (ref 70–99)
Glucose-Capillary: 185 mg/dL — ABNORMAL HIGH (ref 70–99)
Glucose-Capillary: 124 mg/dL — ABNORMAL HIGH (ref 70–99)
Glucose-Capillary: 137 mg/dL — ABNORMAL HIGH (ref 70–99)

## 2024-05-14 LAB — CORTISOL: Cortisol, Plasma: 13.9 ug/dL

## 2024-05-14 LAB — PHOSPHORUS: Phosphorus: 3.7 mg/dL (ref 2.5–4.6)

## 2024-05-14 LAB — AMMONIA: Ammonia: <13 umol/L (ref 9–35)

## 2024-05-14 LAB — MAGNESIUM: Magnesium: 1.7 mg/dL (ref 1.7–2.4)

## 2024-05-14 LAB — SEDIMENTATION RATE: Sed Rate: 70 mm/h — ABNORMAL HIGH (ref 0–22)

## 2024-05-14 MED ORDER — SODIUM CHLORIDE 0.9 % IV SOLN: Freq: Once | INTRAVENOUS | Status: AC

## 2024-05-14 MED ORDER — ACETAMINOPHEN 325 MG PO TABS: 650.0000 mg | ORAL_TABLET | Freq: Four times a day (QID) | ORAL | Status: DC | PRN

## 2024-05-14 MED ORDER — LACTATED RINGERS IV SOLN: INTRAVENOUS | Status: DC

## 2024-05-14 MED ORDER — DEXTROSE 50 % IV SOLN
0.0000 mL | INTRAVENOUS | Status: DC | PRN
  Filled 2024-05-14: qty 50

## 2024-05-14 MED ORDER — DEXTROSE IN LACTATED RINGERS 5 % IV SOLN: INTRAVENOUS | Status: DC

## 2024-05-14 MED ORDER — SODIUM CHLORIDE 0.9 % IV BOLUS
1000.0000 mL | Freq: Once | INTRAVENOUS | Status: AC
  Administered 2024-05-14: 1000 mL via INTRAVENOUS

## 2024-05-14 MED ORDER — VANCOMYCIN HCL 750 MG/150ML IV SOLN: 750.0000 mg | INTRAVENOUS | Status: DC

## 2024-05-14 MED ORDER — LEVOTHYROXINE SODIUM 100 MCG PO TABS
100.0000 ug | ORAL_TABLET | Freq: Every day | ORAL | Status: DC
  Administered 2024-05-16 (×2): 100 ug via ORAL
  Filled 2024-05-14: qty 1

## 2024-05-14 MED ORDER — ONDANSETRON HCL 4 MG/2ML IJ SOLN: 4.0000 mg | Freq: Four times a day (QID) | INTRAMUSCULAR | Status: DC | PRN

## 2024-05-14 MED ORDER — SODIUM CHLORIDE 0.9 % IV BOLUS (SEPSIS)
1000.0000 mL | Freq: Once | INTRAVENOUS | Status: AC
  Administered 2024-05-14: 1000 mL via INTRAVENOUS

## 2024-05-14 MED ORDER — INSULIN REGULAR(HUMAN) IN NACL 100-0.9 UT/100ML-% IV SOLN
INTRAVENOUS | Status: DC
  Administered 2024-05-14: 7.5 [IU]/h via INTRAVENOUS
  Filled 2024-05-14: qty 100

## 2024-05-14 MED ORDER — DEXTROSE 50 % IV SOLN: 0.0000 mL | INTRAVENOUS | Status: DC | PRN

## 2024-05-14 MED ORDER — INSULIN REGULAR(HUMAN) IN NACL 100-0.9 UT/100ML-% IV SOLN
INTRAVENOUS | Status: DC
  Administered 2024-05-14: 9.5 [IU]/h via INTRAVENOUS

## 2024-05-14 MED ORDER — HEPARIN SODIUM (PORCINE) 5000 UNIT/ML IJ SOLN
5000.0000 [IU] | Freq: Three times a day (TID) | INTRAMUSCULAR | Status: DC
  Administered 2024-05-14 – 2024-05-15 (×8): 5000 [IU] via SUBCUTANEOUS
  Filled 2024-05-14 (×5): qty 1

## 2024-05-14 MED ORDER — BRIMONIDINE TARTRATE 0.2 % OP SOLN
1.0000 [drp] | Freq: Two times a day (BID) | OPHTHALMIC | Status: DC
  Administered 2024-05-14 – 2024-05-16 (×9): 1 [drp] via OPHTHALMIC
  Filled 2024-05-14: qty 5

## 2024-05-14 MED ORDER — ONDANSETRON HCL 4 MG PO TABS: 4.0000 mg | ORAL_TABLET | Freq: Four times a day (QID) | ORAL | Status: DC | PRN

## 2024-05-14 MED ORDER — SODIUM CHLORIDE 0.9 % IV SOLN
2.0000 g | Freq: Once | INTRAVENOUS | Status: AC
  Administered 2024-05-14: 2 g via INTRAVENOUS
  Filled 2024-05-14: qty 20

## 2024-05-14 MED ORDER — VANCOMYCIN HCL IN DEXTROSE 1-5 GM/200ML-% IV SOLN
1000.0000 mg | Freq: Once | INTRAVENOUS | Status: AC
  Administered 2024-05-14: 1000 mg via INTRAVENOUS
  Filled 2024-05-14: qty 200

## 2024-05-14 MED ORDER — ACETAMINOPHEN 650 MG RE SUPP
650.0000 mg | Freq: Four times a day (QID) | RECTAL | Status: DC | PRN
  Administered 2024-05-15 (×2): 650 mg via RECTAL
  Filled 2024-05-14: qty 1

## 2024-05-14 MED ORDER — SODIUM CHLORIDE 0.9 % IV SOLN
2.0000 g | INTRAVENOUS | Status: DC
  Administered 2024-05-14 – 2024-05-15 (×3): 2 g via INTRAVENOUS
  Filled 2024-05-14 (×2): qty 12.5

## 2024-05-14 MED ORDER — DORZOLAMIDE HCL-TIMOLOL MAL 2-0.5 % OP SOLN
1.0000 [drp] | Freq: Two times a day (BID) | OPHTHALMIC | Status: DC
  Administered 2024-05-14 – 2024-05-16 (×9): 1 [drp] via OPHTHALMIC
  Filled 2024-05-14: qty 10

## 2024-05-14 MED ORDER — POTASSIUM CHLORIDE 10 MEQ/100ML IV SOLN
10.0000 meq | INTRAVENOUS | Status: AC
  Administered 2024-05-14 – 2024-05-15 (×5): 10 meq via INTRAVENOUS
  Filled 2024-05-14 (×3): qty 100

## 2024-05-14 NOTE — Hospital Course (Signed)
 Patient with PMH of type II DM, HTN, hypothyroidism, HLD, recent right hip fracture glaucoma, GERD present to the hospital with complaints of confusion and lethargy. Patient was recently hospitalized 6/26 to 7/10 for right hip fracture.  Underwent cephalomedullary nailing with Dr. Kendal. 7/14 brought him to the ED for black stool. This admission, family reports that for past 2 weeks patient has been more lethargic and not being herself at the facility. She also has sustained pressure ulcers of bilateral heels which per family progressively appears to be worsening. Was found to have DKA, severe sepsis due to cellulitis, AKI, and acute encephalopathy as well as early right foot osteomyelitis. Palliative care was consulted for further condition about goals of care.  And family decided oriented to complete comfort.  Residential hospice was recommended.  Patient currently stable for transfer to residential hospice.  Assessment and Plan: Severe sepsis secondary to cellulitis, right calcaneal osteomyelitis. Presents with progressively worsening confusion and lethargy. Also has abdominal wall cellulitis as well as cellulitis involving her groin area as well. With leukocytosis and tachypnea as well as tachycardia meeting SIRS criteria. With hypotension, lactic acidosis, AKI as well as encephalopathy meeting severe sepsis criteria. Initiated on IV antibiotics.  Cultures were performed.  So far no growth. X-ray negative for any acute abnormality. Urine catheter was changed. MRI right foot shows evidence of cellulitis without any abscess with osteomyelitis of the calcaneum. Now comfort care.  DKA. Type 2 diabetes mellitus, uncontrolled with hyperglycemia with neuropathy with long-term insulin  use. Blood sugars elevated to 400. Anion gap 16. CO2 19. Lactic acid is only 2. VBG is reassuring 7.386 pH.  Urine has some ketones. Beta-hydroxybutyrate acid 3.9. Initiated on IV insulin  therapy with DKA  protocol. Now comfort care.  Acute metabolic encephalopathy. Multifactorial in the setting of sepsis as well as DKA. CT head negative. Family reports right-sided weakness for last 2 weeks given negative CT head less likely stroke. Unremarkable TSH, free T4, cortisol, ammonia, B12.  Recent right hip fracture. Treated with cephalomedullary nailing. Surgical wound appears to also have some redness.  Hypothyroidism. Continue Synthroid . Mildly elevated TSH and normal free T4.  Dementia. Now with delirium. Also developed delirium during her last hospitalization. Prognosis is poor. Not on any medication. Monitor for now.  Goals of care conversation. EDP discussed with the family. I also discussed with the family with regards to patient's poor prognosis. Daughter who is the POA at bedside feels that the patient would not want any aggressive measures. Patient currently DNR/DNI per family recommendation. Palliative care was consulted. After conversation with the palliative care family decided to transition the patient to complete comfort on 8/12. Residential hospice was recommended.  [facility] was selected by the family.  HTN. Now with hypotension in the setting of sepsis. Responded to IV fluid. Holding losartan  and amlodipine .  On anticoagulation. Started on Eliquis  for DVT prophylaxis after her recent hospitalization for hip surgery.

## 2024-05-14 NOTE — ED Notes (Signed)
 Admitting at bedside

## 2024-05-14 NOTE — Sepsis Progress Note (Signed)
 Sepsis protocol is being followed by [facility].

## 2024-05-14 NOTE — ED Triage Notes (Signed)
 Pt arrived via [facility] from [place] with the facility suspecting sepsis. Been at [facility] for about 4 weeks due to a broken hip, family noticed decrease in mental status since the arrival at [facility] and the insertion of the foley. Red skin near foley and on stomach.  BP 98/42 497 CBG

## 2024-05-14 NOTE — Progress Notes (Signed)
 Pharmacy Antibiotic Note  Jennifer Bailey is a 89 y.o. female admitted on 05/14/2024 presenting with concern for sepsis.  Pharmacy has been consulted for vancomycin  dosing.  Plan: Vancomycin  1g Iv x 1, then 750 mg IV q 48h (eAUC 521) Monitor renal function, Cx and clinical progression to narrow Vancomycin  levels as indicated  Height: 4' 10 (147.3 cm) Weight: 54.4 kg (120 lb) IBW/kg (Calculated) : 40.9  Temp (24hrs), Avg:98.4 F (36.9 C), Min:98.4 F (36.9 C), Max:98.4 F (36.9 C)  Recent Labs  Lab 05/14/24 1431 05/14/24 1512 05/14/24 1647  WBC 19.0*  --   --   CREATININE 1.55*  --   --   LATICACIDVEN  --  2.0* 1.7    Estimated Creatinine Clearance: 19 mL/min (A) (by C-G formula based on SCr of 1.55 mg/dL (H)).    Allergies  Allergen Reactions   Macrodantin [Nitrofurantoin Macrocrystal] Swelling   Prednisone     Make real shaky-like, nervious   Penicillins Rash    ** Tolerates cephalosporins    Dorn Poot, PharmD, [facility] Clinical Pharmacist ED Pharmacist Phone # [phone] 05/14/2024 5:39 PM

## 2024-05-14 NOTE — Progress Notes (Signed)
 K 3.2, Dr. Franky paged.

## 2024-05-14 NOTE — ED Notes (Signed)
 Patient transported to CT

## 2024-05-14 NOTE — ED Notes (Signed)
 X-ray at bedside

## 2024-05-14 NOTE — Plan of Care (Signed)
  Problem: Fluid Volume: Goal: Ability to maintain a balanced intake and output will improve Outcome: Progressing   Problem: Metabolic: Goal: Ability to maintain appropriate glucose levels will improve Outcome: Progressing   Problem: Skin Integrity: Goal: Risk for impaired skin integrity will decrease Outcome: Not Progressing   Problem: Tissue Perfusion: Goal: Adequacy of tissue perfusion will improve Outcome: Progressing   Problem: Cardiac: Goal: Ability to maintain an adequate cardiac output will improve Outcome: Progressing   Problem: Fluid Volume: Goal: Ability to achieve a balanced intake and output will improve Outcome: Progressing   Problem: Metabolic: Goal: Ability to maintain appropriate glucose levels will improve Outcome: Progressing   Problem: Respiratory: Goal: Will regain and/or maintain adequate ventilation Outcome: Progressing   Problem: Urinary Elimination: Goal: Ability to achieve and maintain adequate renal perfusion and functioning will improve Outcome: Progressing   Problem: Clinical Measurements: Goal: Ability to maintain clinical measurements within normal limits will improve Outcome: Progressing Goal: Respiratory complications will improve Outcome: Progressing Goal: Cardiovascular complication will be avoided Outcome: Progressing   Problem: Activity: Goal: Risk for activity intolerance will decrease Outcome: Not Progressing   Problem: Coping: Goal: Level of anxiety will decrease Outcome: Progressing   Problem: Pain Managment: Goal: General experience of comfort will improve and/or be controlled Outcome: Progressing   Problem: Safety: Goal: Ability to remain free from injury will improve Outcome: Progressing

## 2024-05-14 NOTE — H&P (Addendum)
 History and Physical  Patient: Jennifer Bailey FMW:[id number] DOB: 05/26/1935 DOA: 05/14/2024 DOS: the patient was seen and examined on 05/14/2024 Patient coming from: [facility]  Chief Complaint: Confusion  HPI: Patient with PMH of type II DM, HTN, hypothyroidism, HLD, recent right hip fracture glaucoma, GERD present to the hospital with complaints of confusion and lethargy. Patient was drowsy at the time of my evaluation therefore was unable to provide any history. History was obtained from ED provider as well as family. Patient was recently hospitalized 6/26 to 7/10 for right hip fracture.  Underwent cephalomedullary nailing with Dr. Kendal.  Had some encephalopathy secondary to pain medication and urinary retention requiring Foley catheter upon discharge. 7/14 brought him to the ED for black stool.  FOBT negative.  Hemoglobin stable.  Reportedly was eating poorly.  CT abdomen showed retained fecal material. Family reports that for past 2 weeks patient has been more lethargic and not being herself.  At her baseline patient is able to feed herself prior to her hip fracture she was ambulating, independent and was going for shopping, was living by herself. Since last 2 weeks patient has been lethargic, pocketing her food, repeating same sentences again and again and with very poor p.o. intake. She had a Foley catheter on discharge which was recommended for a voiding trial although her Foley catheter was continued at the facility. She also has sustained pressure ulcers of bilateral heels which per family progressively appears to be worsening. Patient has been preferentially using her left upper and lower extremities for last 2 weeks.  Patient is right-handed person. Patient was started on Ativan  on 8/6 for anxiety and Phenergan  on 8/2 for nausea, as needed.  As the patient's mentation progressively declined and the redness involving her abdomen as well as groin area was progressively worsening  family was concerned. On Friday patient was alert and awake but still was confused with slow speech. Today the patient was not able to wake up and was not able to follow commands and when the facility checked her sugar it was elevated.  Patient received some insulin  on a recheck the blood sugars were elevated further and therefore she was brought to the hospital for further evaluation. At the time of my evaluation on initially the patient was drowsy was unable to wake up to sternal rub and would not follow any commands. 30 minutes later on my repeat evaluation patient was able to follow commands, able to tell me her name.  Denied any abdominal pain denied any pain in the head denied any pain in the legs. No diarrhea nausea vomiting fall trauma injury reported by the family or the patient. No other change in the medications reported by the family.  Assessment and Plan: Severe sepsis secondary to cellulitis, possible right foot osteomyelitis. Presents with progressively worsening confusion and lethargy. Also has abdominal wall cellulitis as well as cellulitis involving her groin area as well. With leukocytosis and tachypnea as well as tachycardia meeting SIRS criteria. With hypotension, lactic acidosis, AKI as well as encephalopathy meeting severe sepsis criteria. Will initiate with IV antibiotics, IV vancomycin  as well as IV cefepime . Follow-up on blood cultures and urine cultures. Chest x-ray is negative for any acute abnormality. Patient has a Foley catheter although UA appears to be reassuring. Will provide 1 L IV normal saline bolus again. Continue with IV fluid after that.  DKA. Type 2 diabetes mellitus, uncontrolled with hyperglycemia with neuropathy with long-term insulin  use. Blood sugars elevated to 400. Anion gap  16. CO2 19. Lactic acid is only 2. Suspect the patient is suffering from mild DKA. VBG is reassuring 7.386 pH.  Urine has some ketones. Initiated on IV insulin  therapy.   Which I will continue.  Change the protocol to DKA protocol. Check beta-hydroxybutyrate acid. N.p.o. for now.  Acute metabolic encephalopathy. Multifactorial in the setting of sepsis as well as DKA. CT head negative. Family reports right-sided weakness for last 2 weeks given negative CT head less likely stroke. Mentation improved on my repeat evaluation. Patient will remain NPO.  SLP evaluation in the morning. Anticipate improvement in mentation with improvement in sepsis. Check other metabolic workup including TSH, free T4, cortisol, ammonia, B12.  Right foot osteomyelitis. Bilateral heel pressure ulcer.  Present on admission. Patient has necrotic appearing right foot ulcer.  X-ray foot concerning for osteomyelitis. MRI foot ordered.  Recent right hip fracture. Treated with cephalomedullary nailing. Surgical wound appears to also have some redness. Picture in the chart. Will need to notify orthopedics in the morning.  Hypothyroidism. Continue Synthroid . Checking TSH and free T4.  Dementia. Now with delirium. Also developed delirium during her last hospitalization. Prognosis is poor. Not on any medication. Monitor for now.  Goals of care conversation. EDP discussed with the family. I also discussed with the family with regards to patient's poor prognosis. Daughter who is the POA at bedside feels that the patient would not want any aggressive measures. Patient currently DNR/DNI per family recommendation.  HTN. Now with hypotension in the setting of sepsis. Responding to IV fluids. Holding losartan  and amlodipine .  On anticoagulation. Started on Eliquis  for DVT prophylaxis after her recent hospitalization for hip surgery. Currently anticoagulation will be on hold. Use heparin  subcu for DVT prophylaxis.   Advance Care Planning:   Code Status: Limited: Do not attempt resuscitation (DNR) -DNR-LIMITED -Do Not Intubate/DNI   Consults: None  Prior to Admission  medications   Medication Sig Start Date End Date Taking? Authorizing Provider  Alcohol  Swabs  (ALCOHOL  PADS) 70 % PADS USE 4 PER DAY 11/14/14   Von Pacific, MD  alendronate  (FOSAMAX ) 70 MG tablet Take with a full glass of water on an empty stomach. Patient taking differently: Take 70 mg by mouth once a week. Take with a full glass of water on an empty stomach. 09/24/23   Thapa, [place], MD  amLODipine  (NORVASC ) 5 MG tablet Take 1 tablet (5 mg total) by mouth daily. 04/12/24   Jillian Buttery, MD  apixaban  (ELIQUIS ) 2.5 MG TABS tablet Take 1 tablet (2.5 mg total) by mouth 2 (two) times daily. 04/03/24 05/14/24  Danton Lauraine LABOR, PA-C  BD PEN NEEDLE NANO U/F 32G X 4 MM MISC USE AS DIRECTED FIVE TIMES A DAY. 03/03/16   Von [place], MD  brimonidine  (ALPHAGAN ) 0.2 % ophthalmic solution Place 1 drop into the left eye 2 (two) times daily. 02/29/24   [provider]  cholecalciferol  (CHOLECALCIFEROL ) 25 MCG tablet Take 1 tablet (1,000 Units total) by mouth daily. 04/04/24 07/03/24  Danton Lauraine LABOR, PA-C  Continuous Blood Gluc Receiver (FREESTYLE LIBRE 14 DAY READER) DEVI USE DAILY TO CHECK BLOOD SUGAR 09/24/20   Von Pacific, MD  Continuous Glucose Sensor (FREESTYLE LIBRE 2 SENSOR) MISC APPLY TO ARM EVERY 14 DAYS TO MONITOR BLOOD SUGAR. 10/20/23   Thapa, [place], MD  dorzolamidel-timolol  (COSOPT ) 22.3-6.8 MG/ML SOLN ophthalmic solution Place 1 drop into the left eye 2 (two) times daily.    [provider]  feeding supplement, GLUCERNA SHAKE, (GLUCERNA SHAKE) LIQD Take 237 mLs  by mouth 3 (three) times daily between meals. 04/06/24   Gonfa, Taye T, MD  furosemide  (LASIX ) 20 MG tablet Take 1 tablet by mouth every other day. Patient taking differently: Take 20 mg by mouth daily as needed for fluid. 01/27/22   Abonza, Maritza, PA-C  gabapentin  (NEURONTIN ) 100 MG capsule TAKE 4 CAPSULES (400 MG TOTAL) BY MOUTH AT BEDTIME. Patient taking differently: Take 300 mg by mouth at bedtime. 01/14/24   Thapa, [place], MD   Glucagon  (GVOKE HYPOPEN  1-PACK) 1 MG/0.2ML SOAJ Inject 1 mg into the skin as needed (low blood sugar with impaired consciousness). 06/21/23   Thapa, [place], MD  glucose blood (ONETOUCH ULTRA) test strip Use to check blood sugar once a day if needed 03/04/22   Von Pacific, MD  insulin  aspart (NOVOLOG ) 100 UNIT/ML injection Inject 0-15 Units into the skin 3 (three) times daily with meals. CBG 70 - 120: 0 units CBG 121 - 150: 2 units CBG 151 - 200: 3 units CBG 201 - 250: 5 units CBG 251 - 300: 8 units CBG 301 - 350: 11 units CBG 351 - 400: 15 units CBG > 400: call MD and obtain STAT lab verification 04/06/24   Kathrin Mignon DASEN, MD  insulin  degludec (TRESIBA  FLEXTOUCH) 100 UNIT/ML FlexTouch Pen Inject 15 Units into the skin daily. 04/06/24   Gonfa, Taye T, MD  insulin  lispro (HUMALOG ) 100 UNIT/ML KwikPen INJECT 3 TO 5 UNITS SUBCUTANEOUSLY BEFORE MEALS AS DIRECTED. Patient taking differently: Inject 2-3 Units into the skin 3 (three) times daily. INJECT 3 TO 5 UNITS SUBCUTANEOUSLY BEFORE MEALS AS DIRECTED. 02/07/24   Early, Sara E, NP  levothyroxine  (SYNTHROID ) 100 MCG tablet TAKE 1 TABLET (100 MCG TOTAL) BY MOUTH DAILY BEFORE BREAKFAST. 01/14/24   Thapa, [place], MD  lovastatin  (MEVACOR ) 40 MG tablet TAKE 1 TABLET BY MOUTH ONCE DAILY WITH A MEAL. 01/14/24   Early, Sara E, NP  Multiple Vitamins-Minerals (PRESERVISION AREDS 2) CAPS Take 1 capsule by mouth 2 (two) times daily.    [provider]  Nutritional Supplements (,FEEDING SUPPLEMENT, PROSOURCE PLUS) liquid Take 30 mLs by mouth 2 (two) times daily between meals. 04/06/24   Gonfa, Taye T, MD  Omega-3 1000 MG CAPS Take 2 capsules by mouth 2 (two) times daily.    [provider]  [facility] VERIO test strip USE TO TEST BLOOD SUGAR 4 TIMES DAILY 09/25/20   Von Pacific, MD  oxyCODONE  (OXY IR/ROXICODONE ) 5 MG immediate release tablet Take 0.5 tablets (2.5 mg total) by mouth 3 (three) times daily as needed for severe pain (pain score 7-10). 04/12/24   Adhikari,  Amrit, MD  polyethylene glycol (MIRALAX  / GLYCOLAX ) 17 g packet Take 17 g by mouth 2 (two) times daily. 04/13/24   Jillian Buttery, MD  senna-docusate (SENOKOT-S) 8.6-50 MG tablet Take 1 tablet by mouth 2 (two) times daily. 04/13/24   Adhikari, Amrit, MD  sitaGLIPtin-metformin (JANUMET ) 50-500 MG tablet Take 1 tablet by mouth 2 (two) times daily with a meal. 12/17/23   Early, Camie BRAVO, NP  tamsulosin  (FLOMAX ) 0.4 MG CAPS capsule Take 1 capsule (0.4 mg total) by mouth daily. 04/14/24   Jillian Buttery, MD  traZODone  (DESYREL ) 50 MG tablet Take 0.5-1 tablets (25-50 mg total) by mouth at bedtime as needed for sleep. 02/07/24   Oris Camie BRAVO, NP    Past Medical History:  Diagnosis Date   Counseling regarding goals of care 12/09/2020   Depression    Diabetes mellitus without complication (HCC)    Fall  at home, initial encounter 12/02/2020   Family history of colon cancer- in her brother 07/09/2019   Family history of diabetes mellitus in brother 07/09/2019   Grief 12/24/2022   Hyperlipidemia associated with type 2 diabetes mellitus (HCC)    Hypertension associated with type 2 diabetes mellitus (HCC)    Memory changes    Osteoporosis    Polyneuropathy due to type 2 diabetes mellitus (HCC)    Pressure injury of skin of heel 12/09/2020   Thyroid  disease    Tibia/fibula fracture 12/02/2020   Tremulousness of voice 07/09/2019   Unsteady gait    Past Surgical History:  Procedure Laterality Date   ABDOMINAL HYSTERECTOMY     CHOLECYSTECTOMY     COLON SURGERY     INTRAMEDULLARY (IM) NAIL INTERTROCHANTERIC Right 03/31/2024   Procedure: FIXATION, FRACTURE, INTERTROCHANTERIC, WITH INTRAMEDULLARY ROD;  Surgeon: Kendal Franky SQUIBB, MD;  Location: [facility] OR;  Service: Orthopedics;  Laterality: Right;   ORIF TIBIA PLATEAU Right 12/04/2020   Procedure: OPEN REDUCTION INTERNAL FIXATION (ORIF) TIBIAL PLATEAU;  Surgeon: Kendal Franky SQUIBB, MD;  Location: [facility] OR;  Service: Orthopedics;  Laterality: Right;   Social History:   reports that she has never smoked. She has never used smokeless tobacco. She reports that she does not drink alcohol  and does not use drugs. Allergies  Allergen Reactions   Macrodantin [Nitrofurantoin Macrocrystal] Swelling   Prednisone     Make real shaky-like, nervious   Penicillins Rash    ** Tolerates cephalosporins   Family History  Problem Relation Age of Onset   Cancer Mother    Diabetes Brother    Physical Exam: Vitals:   05/14/24 1701 05/14/24 1702 05/14/24 1715 05/14/24 1721  BP: (!) 91/36 (!) 102/45 (!) 130/52   Pulse: 85 85 90   Resp: (!) 21 19 14    Temp:      TempSrc:      SpO2: 100% 100% 100%   Weight:    54.4 kg  Height:    4' 10 (1.473 m)   Clear to auscultation. S1-S2 present Bowel sounds present. Nontender at the time of my evaluation. No significant edema of the lower extremities. Erythema involving abdominal wall, groin area with warmth.  Mild erythema around the surgical scar. Bilateral heel ulceration. Pupils are equal and round and reactive to light. Withdraws to painful stimuli. Per ED provider patient was initially able to tell her name, identify her daughter. At the time of my evaluation patient speech is somewhat incoherent, able to follow commands, denies any pain.         Data Reviewed: I have reviewed ED notes, Vitals, Lab results and outpatient records. Since last encounter, pertinent lab results CBC and BMP   . I have ordered test including CBC, BMP, TSH.  T4, B12, beta-hydroxybutyrate acid  . I have discussed pt's care plan and test results with ED provider  . I have ordered imaging MRI foot  . I have independently visualized and interpreted imaging x-ray chest which showed no pneumonia  . I have reviewed the last note from recent hospitalization  .    Family Communication: Discussed with family at bedside   The patient is critically ill with multiple organ systems failure and requires high complexity decision making for  assessment and support, frequent evaluation and titration of therapies. Critical Care Time devoted to patient care services described in this note is 35 minutes   Author: Yetta Blanch, MD 05/14/2024 6:15 PM For on call review [email or web address].

## 2024-05-14 NOTE — Progress Notes (Signed)
 Received patient from ED via stretcher to 2C16.  Patient alert but confused to place, time, situation.  Patient placed in bed, assessment complete.   Insulin  gtt infusing and endotool continued.  Wounds assessed and dressed, prevalon boots applied to bilateral heels.  Rash noted to abd, groin, flanks and around breast.  VSS, NAD.  No family with patient at this time.  Fall precautions initiated and floor mats down.  Will continue with plan of care.

## 2024-05-14 NOTE — ED Notes (Signed)
 CCMD Notified
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
## 2024-05-15 ENCOUNTER — Inpatient Hospital Stay (HOSPITAL_COMMUNITY)

## 2024-05-15 ENCOUNTER — Encounter: Admitting: Nurse Practitioner

## 2024-05-15 DIAGNOSIS — I7090 Unspecified atherosclerosis: Secondary | ICD-10-CM

## 2024-05-15 DIAGNOSIS — Z66 Do not resuscitate: Secondary | ICD-10-CM | POA: Diagnosis not present

## 2024-05-15 DIAGNOSIS — Z711 Person with feared health complaint in whom no diagnosis is made: Secondary | ICD-10-CM | POA: Diagnosis not present

## 2024-05-15 DIAGNOSIS — Z7189 Other specified counseling: Secondary | ICD-10-CM | POA: Diagnosis not present

## 2024-05-15 DIAGNOSIS — Z515 Encounter for palliative care: Secondary | ICD-10-CM | POA: Diagnosis not present

## 2024-05-15 DIAGNOSIS — N179 Acute kidney failure, unspecified: Secondary | ICD-10-CM | POA: Diagnosis not present

## 2024-05-15 DIAGNOSIS — A419 Sepsis, unspecified organism: Secondary | ICD-10-CM | POA: Diagnosis not present

## 2024-05-15 DIAGNOSIS — R6520 Severe sepsis without septic shock: Secondary | ICD-10-CM | POA: Diagnosis not present

## 2024-05-15 LAB — URINE CULTURE

## 2024-05-15 LAB — GLUCOSE, CAPILLARY
Glucose-Capillary: 100 mg/dL — ABNORMAL HIGH (ref 70–99)
Glucose-Capillary: 110 mg/dL — ABNORMAL HIGH (ref 70–99)
Glucose-Capillary: 114 mg/dL — ABNORMAL HIGH (ref 70–99)
Glucose-Capillary: 114 mg/dL — ABNORMAL HIGH (ref 70–99)
Glucose-Capillary: 117 mg/dL — ABNORMAL HIGH (ref 70–99)
Glucose-Capillary: 120 mg/dL — ABNORMAL HIGH (ref 70–99)
Glucose-Capillary: 120 mg/dL — ABNORMAL HIGH (ref 70–99)
Glucose-Capillary: 123 mg/dL — ABNORMAL HIGH (ref 70–99)
Glucose-Capillary: 124 mg/dL — ABNORMAL HIGH (ref 70–99)
Glucose-Capillary: 134 mg/dL — ABNORMAL HIGH (ref 70–99)
Glucose-Capillary: 161 mg/dL — ABNORMAL HIGH (ref 70–99)
Glucose-Capillary: 169 mg/dL — ABNORMAL HIGH (ref 70–99)
Glucose-Capillary: 175 mg/dL — ABNORMAL HIGH (ref 70–99)
Glucose-Capillary: 50 mg/dL — ABNORMAL LOW (ref 70–99)
Glucose-Capillary: 68 mg/dL — ABNORMAL LOW (ref 70–99)

## 2024-05-15 LAB — CBC WITH DIFFERENTIAL/PLATELET
Abs Immature Granulocytes: 0.79 K/uL — ABNORMAL HIGH (ref 0.00–0.07)
Basophils Absolute: 0.1 K/uL (ref 0.0–0.1)
Basophils Relative: 1 %
Eosinophils Absolute: 1.7 K/uL — ABNORMAL HIGH (ref 0.0–0.5)
Eosinophils Relative: 10 %
HCT: 30.2 % — ABNORMAL LOW (ref 36.0–46.0)
Hemoglobin: 10.2 g/dL — ABNORMAL LOW (ref 12.0–15.0)
Immature Granulocytes: 5 %
Lymphocytes Relative: 11 %
Lymphs Abs: 1.9 K/uL (ref 0.7–4.0)
MCH: 31.8 pg (ref 26.0–34.0)
MCHC: 33.8 g/dL (ref 30.0–36.0)
MCV: 94.1 fL (ref 80.0–100.0)
Monocytes Absolute: 1.4 K/uL — ABNORMAL HIGH (ref 0.1–1.0)
Monocytes Relative: 8 %
Neutro Abs: 11.4 K/uL — ABNORMAL HIGH (ref 1.7–7.7)
Neutrophils Relative %: 65 %
Platelets: 422 K/uL — ABNORMAL HIGH (ref 150–400)
RBC: 3.21 MIL/uL — ABNORMAL LOW (ref 3.87–5.11)
RDW: 14.3 % (ref 11.5–15.5)
WBC: 17.3 K/uL — ABNORMAL HIGH (ref 4.0–10.5)
nRBC: 0 % (ref 0.0–0.2)

## 2024-05-15 LAB — BASIC METABOLIC PANEL WITH GFR
Anion gap: 8 (ref 5–15)
BUN: 23 mg/dL (ref 8–23)
CO2: 21 mmol/L — ABNORMAL LOW (ref 22–32)
Calcium: 7.8 mg/dL — ABNORMAL LOW (ref 8.9–10.3)
Chloride: 112 mmol/L — ABNORMAL HIGH (ref 98–111)
Creatinine, Ser: 0.96 mg/dL (ref 0.44–1.00)
GFR, Estimated: 58 mL/min — ABNORMAL LOW (ref >60)
Glucose, Bld: 111 mg/dL — ABNORMAL HIGH (ref 70–99)
Potassium: 3.3 mmol/L — ABNORMAL LOW (ref 3.5–5.1)
Sodium: 141 mmol/L (ref 135–145)

## 2024-05-15 LAB — VAS US ABI WITH/WO TBI
Left ABI: 0.95
Right ABI: 0.95

## 2024-05-15 LAB — BETA-HYDROXYBUTYRIC ACID: Beta-Hydroxybutyric Acid: 0.17 mmol/L (ref 0.05–0.27)

## 2024-05-15 MED ORDER — INSULIN ASPART 100 UNIT/ML IJ SOLN
0.0000 [IU] | Freq: Three times a day (TID) | INTRAMUSCULAR | Status: DC
  Administered 2024-05-15 (×2): 2 [IU] via SUBCUTANEOUS

## 2024-05-15 MED ORDER — DIPHENHYDRAMINE-ZINC ACETATE 2-0.1 % EX CREA
TOPICAL_CREAM | Freq: Three times a day (TID) | CUTANEOUS | Status: DC | PRN
  Filled 2024-05-15: qty 28

## 2024-05-15 MED ORDER — GERHARDT'S BUTT CREAM
1.0000 | TOPICAL_CREAM | Freq: Three times a day (TID) | CUTANEOUS | Status: DC
  Administered 2024-05-15 – 2024-05-16 (×6): 1 via TOPICAL
  Filled 2024-05-15: qty 60

## 2024-05-15 MED ORDER — MORPHINE SULFATE (PF) 2 MG/ML IV SOLN
1.0000 mg | INTRAVENOUS | Status: DC | PRN
  Administered 2024-05-15 – 2024-05-16 (×10): 1 mg via INTRAVENOUS
  Filled 2024-05-15 (×4): qty 1

## 2024-05-15 MED ORDER — DEXTROSE 50 % IV SOLN
25.0000 g | INTRAVENOUS | Status: AC
  Administered 2024-05-15 (×2): 25 g via INTRAVENOUS

## 2024-05-15 MED ORDER — INSULIN ASPART 100 UNIT/ML IJ SOLN: 0.0000 [IU] | Freq: Every day | INTRAMUSCULAR | Status: DC

## 2024-05-15 MED ORDER — BUSPIRONE HCL 10 MG PO TABS
5.0000 mg | ORAL_TABLET | Freq: Three times a day (TID) | ORAL | Status: DC
  Administered 2024-05-15 (×4): 5 mg via ORAL
  Filled 2024-05-15 (×2): qty 1

## 2024-05-15 MED ORDER — SODIUM CHLORIDE 0.9 % IV SOLN: INTRAVENOUS | Status: DC

## 2024-05-15 MED ORDER — DIAZEPAM 5 MG/ML IJ SOLN
2.0000 mg | Freq: Once | INTRAMUSCULAR | Status: AC
  Administered 2024-05-15 (×2): 2 mg via INTRAVENOUS
  Filled 2024-05-15: qty 2

## 2024-05-15 MED ORDER — FENTANYL CITRATE PF 50 MCG/ML IJ SOSY
6.2500 ug | PREFILLED_SYRINGE | Freq: Once | INTRAMUSCULAR | Status: AC
  Administered 2024-05-15 (×2): 6.5 ug via INTRAVENOUS
  Filled 2024-05-15: qty 1

## 2024-05-15 MED ORDER — CHLORHEXIDINE GLUCONATE CLOTH 2 % EX PADS
6.0000 | MEDICATED_PAD | Freq: Every day | CUTANEOUS | Status: DC
  Administered 2024-05-14 – 2024-05-15 (×4): 6 via TOPICAL

## 2024-05-15 MED ORDER — INSULIN GLARGINE-YFGN 100 UNIT/ML ~~LOC~~ SOLN
10.0000 [IU] | Freq: Every day | SUBCUTANEOUS | Status: DC
  Administered 2024-05-15 (×2): 10 [IU] via SUBCUTANEOUS
  Filled 2024-05-15 (×2): qty 0.1

## 2024-05-15 NOTE — Evaluation (Signed)
 Occupational Therapy Evaluation Patient Details Name: Jennifer Bailey MRN: [id number] DOB: 08-14-1935 Today's Date: 05/15/2024   History of Present Illness   89 yo female presents with confusion and lethargy (not following commands or response to sternal rubs). Workup underway for sepsis secondary to cellulitis and possible R foot osteomyelitis PMH DM2, HTN, hypothyroidism, HLD, R hip fx 6/26-7/10/25 , glaucoma, GERD`     Clinical Impressions PT admitted with sepsis due to cellulitis. Pt currently with functional limitiations due to the deficits listed below (see OT problem list). Pt prior to June 2025 admission was indep with all care. Pt since July 2025 has required (A) for all adls with progressive decline in functional ability. Pt at this time is able to (A) with bed mobility but noted to have increased risk for skin break down. Pt could benefit from air mattress overlay.  Pt will benefit from skilled OT to increase their independence and safety with adls and balance to allow discharge skilled inpatient follow up therapy, <3 hours/day. .      If plan is discharge home, recommend the following:   Two people to help with walking and/or transfers;Two people to help with bathing/dressing/bathroom     Functional Status Assessment   Patient has had a recent decline in their functional status and demonstrates the ability to make significant improvements in function in a reasonable and predictable amount of time.     Equipment Recommendations   Wheelchair (measurements OT);Wheelchair cushion (measurements OT);Hospital bed;Hoyer lift (air mattress)     Recommendations for Other Services         Precautions/Restrictions   Precautions Precautions: Fall Recall of Precautions/Restrictions: Impaired Precaution/Restrictions Comments: bil pravalon with heel wounds, foley Restrictions Weight Bearing Restrictions Per Provider Order: No     Mobility Bed Mobility Overal bed  mobility: Needs Assistance Bed Mobility: Supine to Sit, Sit to Supine     Supine to sit: +2 for physical assistance, Max assist, HOB elevated, Used rails Sit to supine: Total assist, +2 for physical assistance, +2 for safety/equipment, HOB elevated   General bed mobility comments: pt using bil Ue on bed rail to help come to sitting eob. pt with total support for bil LE. pt requires support of R LE even in sitting. pt with trunk activation and good initiation for static sit. pt fatigues and return to supine total (A) for all components    Transfers                   General transfer comment: not appropriate at this time      Balance                                           ADL either performed or assessed with clinical judgement   ADL Overall ADL's : Needs assistance/impaired Eating/Feeding: Maximal assistance;Bed level Eating/Feeding Details (indicate cue type and reason): drinking from straw Grooming: Maximal assistance   Upper Body Bathing: Total assistance   Lower Body Bathing: Total assistance   Upper Body Dressing : Total assistance   Lower Body Dressing: Total assistance                 General ADL Comments: pt mouth breathing and does not follow commands for pursed lip breathing. attempting to calm with warm blanket, reduced lights and soothing music ( spa sounds)     Vision Baseline Vision/History:  1 Wears glasses;3 Glaucoma Additional Comments: pt turning head with increased time to name call. pt inconsistent in attempting to visually attend to staff     Perception         Praxis         Pertinent Vitals/Pain Pain Assessment Pain Assessment: PAINAD Breathing: noisy labored breathing, long periods of hyperventilation, Cheyne-Stokes respirations Negative Vocalization: repeated troubled calling out, loud moaning/groaning, crying Facial Expression: sad, frightened, frown Body Language: tense, distressed pacing,  fidgeting Consolability: unable to console, distract or reassure PAINAD Score: 8 Pain Intervention(s): Monitored during session, Repositioned, Limited activity within patient's tolerance     Extremity/Trunk Assessment Upper Extremity Assessment Upper Extremity Assessment: Generalized weakness;Right hand dominant   Lower Extremity Assessment Lower Extremity Assessment: RLE deficits/detail;Defer to PT evaluation RLE Deficits / Details: pt noted to have internal rotation and ankle flexion with pain with any attempts to bring to more neutral positoin   Cervical / Trunk Assessment Cervical / Trunk Assessment: Kyphotic   Communication Communication Communication: Impaired Factors Affecting Communication: Reduced clarity of speech   Cognition Arousal: Alert Behavior During Therapy: Anxious Cognition: Cognition impaired     Awareness: Intellectual awareness impaired, Online awareness impaired       OT - Cognition Comments: pt repeating whats wrong with me pt initially supine able to greet therapist. pt agreed to sit up on eob. pt states I have to go to the doctor monday and they are going to look at my knee. with increased time able to report that the R knee is the source of discomfort                 Following commands: Impaired       Cueing  General Comments      VSS on RA pt noted to have full body rash. pt noted to have edema in R LE   Exercises Exercises: Other exercises Other Exercises Other Exercises: atttempts at activation of R LE with quad set and not initiation but does move LLE   Shoulder Instructions      Home Living Family/patient expects to be discharged to:: Skilled nursing facility                                 Additional Comments: daughter reports that she has passed 20 days in snf stay. SW notes indicate does not want pt to return to SNF      Prior Functioning/Environment Prior Level of Function : Needs assist              Mobility Comments: daughter reports seein pt stand one time in the last 4 weeks so uncertain how much OOB was taking place at SNF ADLs Comments: requires (A) from staff max to total    OT Problem List: Decreased strength;Decreased activity tolerance;Impaired balance (sitting and/or standing);Decreased cognition;Decreased safety awareness;Decreased knowledge of use of DME or AE;Decreased knowledge of precautions;Cardiopulmonary status limiting activity;Increased edema   OT Treatment/Interventions: Self-care/ADL training;Therapeutic exercise;Energy conservation;DME and/or AE instruction;Manual therapy;Modalities;Therapeutic activities;Cognitive remediation/compensation;Patient/family education;Balance training      OT Goals(Current goals can be found in the care plan section)   Acute Rehab OT Goals Patient Stated Goal: none stated by patient OT Goal Formulation: Patient unable to participate in goal setting Time For Goal Achievement: 05/29/24 Potential to Achieve Goals: Fair   OT Frequency:  Min 2X/week    Co-evaluation PT/OT/SLP Co-Evaluation/Treatment: Yes Reason for Co-Treatment: To address functional/ADL transfers;Necessary  to address cognition/behavior during functional activity PT goals addressed during session: Mobility/safety with mobility;Balance OT goals addressed during session: Strengthening/ROM      AM-PAC OT 6 Clicks Daily Activity     Outcome Measure Help from another person eating meals?: A Lot Help from another person taking care of personal grooming?: A Lot Help from another person toileting, which includes using toliet, bedpan, or urinal?: Total Help from another person bathing (including washing, rinsing, drying)?: Total Help from another person to put on and taking off regular upper body clothing?: Total Help from another person to put on and taking off regular lower body clothing?: Total 6 Click Score: 8   End of Session Nurse Communication: Mobility  status;Precautions  Activity Tolerance: Other (comment) (pt increased restless at eob) Patient left: in bed;with call bell/phone within reach;with family/visitor present  OT Visit Diagnosis: Unsteadiness on feet (R26.81);Muscle weakness (generalized) (M62.81)                Time: 8578-8552 OT Time Calculation (min): 26 min Charges:  OT General Charges $OT Visit: 1 Visit OT Evaluation $OT Eval Moderate Complexity: 1 Mod   Brynn, OTR/L  Acute Rehabilitation Services Office: [phone] .   Ely Molt 05/15/2024, 3:11 PM

## 2024-05-15 NOTE — Progress Notes (Signed)
 ABI exam is completed. Jerime Arif, RVT

## 2024-05-15 NOTE — Evaluation (Signed)
 Physical Therapy Evaluation Patient Details Name: Jennifer Bailey MRN: [id number] DOB: 10/28/1934 Today's Date: 05/15/2024  History of Present Illness  89 yo female presents with confusion and lethargy (not following commands or response to sternal rubs). Workup underway for sepsis secondary to cellulitis and possible R foot osteomyelitis PMH DM2, HTN, hypothyroidism, HLD, R hip fx 6/26-7/10/25 , glaucoma, GERD`  Clinical Impression  Pt admitted with above diagnosis. Requires max-total assist +2 for bed mobility today. She was ind prior to hip fracture admission in June. Daughter reports pt has been working on standing at [facility] but has acutely declined. Pt sat EOB with min assist, LEs supported for comfort. Very anxious and confused. Patient will benefit from continued inpatient follow up therapy, <3 hours/day. Pt currently with functional limitations due to the deficits listed below (see PT Problem List). Pt will benefit from acute skilled PT to increase their independence and safety with mobility to allow discharge.           If plan is discharge home, recommend the following: Two people to help with walking and/or transfers;Two people to help with bathing/dressing/bathroom;Assistance with cooking/housework;Direct supervision/assist for medications management;Direct supervision/assist for financial management;Assist for transportation;Help with stairs or ramp for entrance;Supervision due to cognitive status;Assistance with feeding   Can travel by private vehicle   No    Equipment Recommendations None recommended by PT  Recommendations for Other Services       Functional Status Assessment Patient has had a recent decline in their functional status and demonstrates the ability to make significant improvements in function in a reasonable and predictable amount of time.     Precautions / Restrictions Precautions Precautions: Fall Recall of Precautions/Restrictions:  Impaired Precaution/Restrictions Comments: bil heel wounds. Restrictions Weight Bearing Restrictions Per Provider Order: Yes RLE Weight Bearing Per Provider Order: Weight bearing as tolerated (From recent IM nail)      Mobility  Bed Mobility Overal bed mobility: Needs Assistance Bed Mobility: Supine to Sit, Rolling, Sit to Supine Rolling: Max assist, +2 for physical assistance, Used rails   Supine to sit: Max assist, HOB elevated, Used rails, +2 for physical assistance Sit to supine: +2 for physical assistance, Used rails, Total assist   General bed mobility comments: Patient able to reach for rail with guidance. Max assist +2 required to roll and rise to EOB. LEs and trunk supported by therapists. Pt anxious throughout. total assist +2 for helicopter technique to support trunk and LEs back into bed. Total assist to scoot up in bed.    Transfers                   General transfer comment: Deferred.    Ambulation/Gait                  Stairs            Wheelchair Mobility     Tilt Bed    Modified Rankin (Stroke Patients Only)       Balance Overall balance assessment: Needs assistance Sitting-balance support: Feet supported, Single extremity supported Sitting balance-Leahy Scale: Poor Sitting balance - Comments: Progressed from min to CGA back to min assist again throughout duration of session while seated EOB. LEs supported in extension, suspect Rt knee? LE pain. Postural control: Posterior lean                                   Pertinent Vitals/Pain Pain  Assessment Pain Assessment: PAINAD Breathing: noisy labored breathing, long periods of hyperventilation, Cheyne-Stokes respirations Negative Vocalization: occasional moan/groan, low speech, negative/disapproving quality Facial Expression: sad, frightened, frown Body Language: tense, distressed pacing, fidgeting Consolability: distracted or reassured by voice/touch PAINAD  Score: 6 Pain Intervention(s): Limited activity within patient's tolerance, Monitored during session, Repositioned    Home Living Family/patient expects to be discharged to:: Skilled nursing facility                   Additional Comments: daughter reports that she has passed 20 days in snf stay. SW notes indicate does not want pt to return to SNF    Prior Function Prior Level of Function : Needs assist             Mobility Comments: daughter reports seein pt stand one time in the last 4 weeks so uncertain how much OOB was taking place at SNF ADLs Comments: requires (A) from staff max to total     Extremity/Trunk Assessment   Upper Extremity Assessment Upper Extremity Assessment: Generalized weakness;Right hand dominant    Lower Extremity Assessment Lower Extremity Assessment: RLE deficits/detail;Defer to PT evaluation RLE Deficits / Details: pt noted to have internal rotation and ankle flexion with pain with any attempts to bring to more neutral positoin    Cervical / Trunk Assessment Cervical / Trunk Assessment: Kyphotic  Communication   Communication Communication: Impaired Factors Affecting Communication: Reduced clarity of speech    Cognition Arousal: Alert Behavior During Therapy: Anxious, Flat affect   PT - Cognitive impairments: Difficult to assess, Awareness, Attention, Initiation, Sequencing, Problem solving, Safety/Judgement                         Following commands: Impaired Following commands impaired: Follows one step commands inconsistently, Follows one step commands with increased time     Cueing Cueing Techniques: Verbal cues, Gestural cues, Tactile cues, Visual cues     General Comments General comments (skin integrity, edema, etc.): VSS on RA pt noted to have full body rash. pt noted to have edema in R LE    Exercises     Assessment/Plan    PT Assessment Patient needs continued PT services  PT Problem List Decreased  strength;Decreased range of motion;Decreased activity tolerance;Decreased balance;Decreased mobility;Decreased coordination;Decreased cognition;Decreased knowledge of use of DME;Decreased safety awareness;Decreased knowledge of precautions;Pain       PT Treatment Interventions DME instruction;Gait training;Functional mobility training;Therapeutic activities;Therapeutic exercise;Balance training;Neuromuscular re-education;Cognitive remediation;Patient/family education;Wheelchair mobility training;Modalities    PT Goals (Current goals can be found in the Care Plan section)  Acute Rehab PT Goals Patient Stated Goal: none stated PT Goal Formulation: With patient/family Time For Goal Achievement: 05/29/24 Potential to Achieve Goals: Fair    Frequency Min 1X/week     Co-evaluation PT/OT/SLP Co-Evaluation/Treatment: Yes Reason for Co-Treatment: To address functional/ADL transfers;Necessary to address cognition/behavior during functional activity PT goals addressed during session: Mobility/safety with mobility;Balance OT goals addressed during session: Strengthening/ROM       AM-PAC PT 6 Clicks Mobility  Outcome Measure Help needed turning from your back to your side while in a flat bed without using bedrails?: Total Help needed moving from lying on your back to sitting on the side of a flat bed without using bedrails?: Total Help needed moving to and from a bed to a chair (including a wheelchair)?: Total Help needed standing up from a chair using your arms (e.g., wheelchair or bedside chair)?: Total Help needed to walk  in hospital room?: Total Help needed climbing 3-5 steps with a railing? : Total 6 Click Score: 6    End of Session   Activity Tolerance: Other (comment) (Anxious, impaired cognition.) Patient left: in bed;with call bell/phone within reach;with bed alarm set;with family/visitor present (Prevalon boost positioned appropriately to assist with RLE in neutral) Nurse  Communication: Mobility status;Other (comment) (Pt very anxious at rest, confused.) PT Visit Diagnosis: Other abnormalities of gait and mobility (R26.89);Muscle weakness (generalized) (M62.81);History of falling (Z91.81);Difficulty in walking, not elsewhere classified (R26.2);Other symptoms and signs involving the nervous system (R29.898);Pain Pain - Right/Left: Right Pain - part of body: Ankle and joints of foot;Leg;Hip;Knee    Time: 1425-1448 PT Time Calculation (min) (ACUTE ONLY): 23 min   Charges:   PT Evaluation $PT Eval Moderate Complexity: 1 Mod   PT General Charges $$ ACUTE PT VISIT: 1 Visit         Leontine Roads, PT, DPT [facility]  Rehabilitation Services Physical Therapist Office: [phone] Website: [email or web address]   Leontine GORMAN Roads 05/15/2024, 3:15 PM

## 2024-05-15 NOTE — Progress Notes (Signed)
 OT Cancellation Note  Patient Details Name: Jennifer Bailey MRN: [id number] DOB: [birth year]   Cancelled Treatment:    Reason Eval/Treat Not Completed: Patient not medically ready  Ely Molt 05/15/2024, 11:21 AM

## 2024-05-15 NOTE — TOC Initial Note (Signed)
 Transition of Care [facility]) - Initial/Assessment Note    Patient Details  Name: Jennifer Bailey MRN: [id number] Date of Birth: 1934/10/28  [facility]) CM/SW Contact:    Lauraine FORBES Saa, LCSWA Phone Number: 05/15/2024, 11:43 AM  Clinical Narrative:                  11:43 AM Per chart review, patient is from [facility]. SNF admissions confirmed patient was STR at SNF (used 30 days) and is able to return with approved insurance authorization upon discharge. Per chart review, patient has SNF history with [facility]. Patient has HH history with [facility]. Patient does not appear to have DME history, per chart review. Patient has a PCP and insurance. Patient's preferred pharmacy's are [facility], [facility] [place], [facility] [address], and [address] [place]. Palliative Care Medicine Team is currently following patient. TOC will continue to follow and be available to assist.  Expected Discharge Plan: Skilled Nursing Facility Barriers to Discharge: Continued Medical Work up, English as a second language teacher   Patient Goals and CMS Choice            Expected Discharge Plan and Services In-house Referral: Clinical Social Work   Post Acute Care Choice: Skilled Nursing Facility Living arrangements for the past 2 months: Single Family Home, Skilled Nursing Facility                                      Prior Living Arrangements/Services Living arrangements for the past 2 months: Single Family Home, Skilled Nursing Facility Lives with:: Facility Resident Patient language and need for interpreter reviewed:: Yes        Need for Family Participation in Patient Care: Yes (Comment) Care giver support system in place?: Yes (comment)   Criminal Activity/Legal Involvement Pertinent to Current Situation/Hospitalization: No - Comment as needed  Activities of Daily Living   ADL Screening (condition at time of  admission) Independently performs ADLs?: No Does the patient have a NEW difficulty with bathing/dressing/toileting/self-feeding that is expected to last >3 days?: No Does the patient have a NEW difficulty with getting in/out of bed, walking, or climbing stairs that is expected to last >3 days?: No Does the patient have a NEW difficulty with communication that is expected to last >3 days?: No Is the patient deaf or have difficulty hearing?: No Does the patient have difficulty seeing, even when wearing glasses/contacts?: No Does the patient have difficulty concentrating, remembering, or making decisions?: Yes  Permission Sought/Granted Permission sought to share information with : Family Supports, Oceanographer granted to share information with : No (Contact information on chart)  Share Information with NAME: [name] Mainland  Permission granted to share info w AGENCY: SNF  Permission granted to share info w Relationship: Daughter  Permission granted to share info w Contact Information: [phone]  Emotional Assessment   Attitude/Demeanor/Rapport: Unable to Assess Affect (typically observed): Unable to Assess Orientation: : Oriented to Self Alcohol  / Substance Use: Not Applicable Psych Involvement: No (comment)  Admission diagnosis:  Encephalopathy acute [G93.40] Acute hyperglycemia [R73.9] Sepsis (HCC) [A41.9] Severe sepsis (HCC) [A41.9, R65.20] Ulcer of right foot with necrosis of muscle (HCC) [L97.513] Patient Active Problem List   Diagnosis Date Noted   Sepsis (HCC) 05/14/2024   Foot osteomyelitis, right (HCC) 05/14/2024   Abdominal wall cellulitis 05/14/2024   Acute metabolic encephalopathy 05/14/2024   AKI (acute  kidney injury) (HCC) 05/14/2024   DKA (diabetic ketoacidosis) (HCC) 05/14/2024   Malnutrition of moderate degree 04/07/2024   Hip fracture (HCC) 03/30/2024   ARF (acute renal failure) (HCC) 03/30/2024   Primary insomnia 02/07/2024    Paronychia of great toe of left foot 01/05/2024   Abrasion of anterior lower leg, initial encounter 10/15/2023   Seborrheic keratoses 10/15/2023   Calcaneal spur of right foot 10/15/2023   Depression, major, single episode, mild (HCC) 01/02/2023   Unsteady gait when walking 01/02/2023   Dementia without behavioral disturbance, psychotic disturbance, mood disturbance, or anxiety (HCC) 01/02/2023   Glaucoma 12/04/2020   Anemia 12/13/2019   Widowed 07/09/2019   Acquired autoimmune hypothyroidism 08/20/2014   Osteoporosis 11/20/2013   Diabetic polyneuropathy associated with type 2 diabetes mellitus (HCC) 08/14/2013   Hypertension associated with diabetes (HCC) 06/14/2013   Hyperlipidemia associated with type 2 diabetes mellitus (HCC) 06/14/2013   Type 2 diabetes mellitus with hypoglycemia without coma, with long-term current use of insulin  (HCC) 05/29/2013   PCP:  Oris Camie BRAVO, NP Pharmacy:   [facility] - [place], [place] - [address] [place] [address] Phone: [phone] Fax: [phone]  [email or web address] [place], [place] - [address] [place]  [address] Phone: [phone] Fax: [phone]  [facility] [address] [place]), [place] - [address] [place] [address] Phone: [phone] Fax: [phone]  [facility] - [place], [place] - [place] [address] [place] [address] Phone: [phone] Fax: [phone]     Social Drivers of Health (SDOH) Social History: SDOH Screenings   Food Insecurity: No Food Insecurity (03/31/2024)  Housing: Low Risk  (03/31/2024)  Transportation Needs: No Transportation Needs (03/31/2024)  Utilities: Not At Risk (03/31/2024)  Depression (PHQ2-9): Low Risk  (02/07/2024)  Financial Resource Strain: Low Risk  (02/16/2023)  Physical Activity: Sufficiently Active (02/16/2023)  Social Connections:  Moderately Isolated (03/31/2024)  Stress: No Stress Concern Present (02/16/2023)  Tobacco Use: Low Risk  (05/14/2024)   SDOH Interventions:     Readmission Risk Interventions     No data to display

## 2024-05-15 NOTE — Progress Notes (Addendum)
 PROGRESS NOTE    Jennifer Bailey  FMW:[id number] DOB: 06-Dec-1934 DOA: 05/14/2024 PCP: Oris Camie BRAVO, NP   Brief Narrative:   Patient with PMH of type II DM, HTN, hypothyroidism, HLD, recent right hip fracture glaucoma, GERD present to the hospital with complaints of confusion and.  She has been diagnosed with sepsis as well as diabetic ketoacidosis.  DKA has resolved.  Sepsis is in the setting of right foot cellulitis as well as abdominal wall cellulitis, currently, she is on broad-spectrum intravenous antibiotics.  Follow-up blood cultures.  Palliative as well as speech therapy consulted.  Prognosis poor.  Assessment & Plan:  Principal Problem:   Sepsis (HCC) Active Problems:   Hyperlipidemia associated with type 2 diabetes mellitus (HCC)   Diabetic polyneuropathy associated with type 2 diabetes mellitus (HCC)   Acquired autoimmune hypothyroidism   Glaucoma   Dementia without behavioral disturbance, psychotic disturbance, mood disturbance, or anxiety (HCC)   Malnutrition of moderate degree   Foot osteomyelitis, right (HCC)   Abdominal wall cellulitis   Acute metabolic encephalopathy   AKI (acute kidney injury) (HCC)   DKA (diabetic ketoacidosis) (HCC)   Severe sepsis secondary to right foot as well as abdominal wall and groin cellulitis: Presents with progressively worsening confusion and lethargy. Also has abdominal wall cellulitis as well as cellulitis involving her groin area as well. With hypotension, lactic acidosis, AKI as well as encephalopathy meeting severe sepsis criteria. Continue with intravenous antibiotics and adjust accordingly Follow-up on blood cultures and urine cultures. Chest x-ray is negative for any acute abnormality. Patient has a Foley catheter although UA appears to be reassuring. Continue with intravenous fluids   DKA: Resolved now.  Patient was started on insulin  drip as well as intravenous fluids right foot Type 2 diabetes mellitus, uncontrolled  with hyperglycemia with neuropathy with long-term insulin  use. Blood sugars elevated to 400 upon admission. Patient has been started on long-acting insulin  as well as sliding scale insulin .  Medications adjustment will be done based on patient's blood glucose levels.   Acute metabolic encephalopathy, acute hyperactive delirium in the setting of underlying advanced Alzheimer's dementia. Multifactorial in the setting of sepsis as well as DKA. CT head negative. Family reports right-sided weakness for last 2 weeks given negative CT head less likely stroke. Pending speech therapy evaluation.  Patient has been kept n.p.o.    Right foot cellulitis, POA: MRI of the foot showed apparent soft tissue ulceration along the posteromedial aspect of the calcaneal tuberosity with associated mild skin thickening and subcutaneous edema, suspicious for cellulitis. No evidence of soft tissue abscess or foreign body. Bilateral heel pressure ulcer.  Present on admission. Patient has necrotic appearing right foot ulcer.   Continue with intravenous antibiotics LE ABI is within normal limits.  Abdominal wall and groin cellulitis: Associated with itching, ordered benadryl  cream. Continue with antibiotics.   Recent right hip fracture Treated with cephalomedullary nailing.    Hypothyroidism. Continue Synthroid .    Alzheimer's dementia with delirium, POA: Also developed delirium during her last hospitalization. Prognosis is poor. Not on any medication. Monitor for now.     Essential hypertension Now with hypotension in the setting of sepsis. Responding to IV fluids. Holding losartan  and amlodipine .   DVT prophylaxis: Heparin  5000 units subcutaneous every 8 hours.  Disposition: Back to [facility] skilled nursing facility   DVT prophylaxis: heparin  injection 5,000 Units Start: 05/14/24 1745     Code Status: Limited: Do not attempt resuscitation (DNR) -DNR-LIMITED -Do Not Intubate/DNI  Family  Communication:  None at the bedside Status is: Inpatient Remains inpatient appropriate because: AMS, sepsis    Subjective:  She was looking uncomfortable this morning. She was itching. Unable to communicate effectively.  Spoke with the patient's nurse at the bedside and she had received Tylenol  suppository for possible pain.  She is also on insulin  drip as well as dextrose  infusion.  Spoke to the nurse about discontinuing both as she is not in DKA anymore.   Examination:  General exam: Altered mentation, appears to be in moderate to severe distress, unable to communicate effectively Respiratory system: Clear to auscultation.  Tachypnea. Cardiovascular system: Tachycardia, RRR. No JVD, murmurs, rubs, gallops or clicks. No pedal edema. Gastrointestinal system: Abdomen is nondistended, soft and nontender. No organomegaly or masses felt. Normal bowel sounds heard. Central nervous system: Altered Mental status, confused and disoriented Extremities: Moving all extremities symmetrically, unable to assess motor or sensory system because of altered mentation Skin: There is evidence of redness over the anterior abdominal wall, lateral abdominal wall which is extending into the patient's groin and perineal areas without any evidence of ulcerations or necrosis Psychiatry: Altered       Diet Orders (From admission, onward)     Start     Ordered   05/14/24 1735  Diet NPO time specified  (Diabetes Ketoacidosis (DKA))  Diet effective now        05/14/24 1734            Objective: Vitals:   05/14/24 2253 05/15/24 0000 05/15/24 0306 05/15/24 0726  BP: (!) 126/47 (!) 129/59 133/66 (!) 123/57  Pulse: 89 99 87 91  Resp: 16 (!) 23 15 (!) 25  Temp: 97.6 F (36.4 C) 97.6 F (36.4 C) 97.9 F (36.6 C) 98 F (36.7 C)  TempSrc: Oral Oral Oral Oral  SpO2: 99% 99% 99% 99%  Weight:      Height:        Intake/Output Summary (Last 24 hours) at 05/15/2024 0905 Last data filed at 05/15/2024  0729 Gross per 24 hour  Intake 1395.3 ml  Output 325 ml  Net 1070.3 ml   Filed Weights   05/14/24 1721  Weight: 54.4 kg    Scheduled Meds:  brimonidine   1 drop Left Eye BID   Chlorhexidine  Gluconate Cloth  6 each Topical Daily   dorzolamide -timolol   1 drop Left Eye BID   Gerhardt's butt cream  1 Application Topical TID   heparin   5,000 Units Subcutaneous Q8H   insulin  aspart  0-5 Units Subcutaneous QHS   insulin  aspart  0-9 Units Subcutaneous TID WC   insulin  glargine-yfgn  10 Units Subcutaneous Daily   levothyroxine   100 mcg Oral QAC breakfast   Continuous Infusions:  sodium chloride      ceFEPime  (MAXIPIME ) IV Stopped (05/14/24 1957)   [START ON 05/16/2024] vancomycin       Nutritional status     Body mass index is 25.08 kg/m.  Data Reviewed:   CBC: Recent Labs  Lab 05/14/24 1431 05/14/24 1511  WBC 19.0*  --   NEUTROABS 15.1*  --   HGB 10.3* 10.5*  HCT 32.1* 31.0*  MCV 95.8  --   PLT 428*  --    Basic Metabolic Panel: Recent Labs  Lab 05/14/24 1431 05/14/24 1511 05/14/24 1735 05/14/24 2138 05/15/24 0149  NA 139 140 141 142 141  K 4.2 4.1 4.5 3.2* 3.3*  CL 104  --  107 111 112*  CO2 19*  --  16* 19* 21*  GLUCOSE 403*  --  369* 156* 111*  BUN 33*  --  28* 25* 23  CREATININE 1.55*  --  1.40* 1.11* 0.96  CALCIUM 8.7*  --  8.4* 8.0* 7.8*  MG  --   --  1.7  --   --   PHOS  --   --  3.7  --   --    GFR: Estimated Creatinine Clearance: 30.7 mL/min (by C-G formula based on SCr of 0.96 mg/dL). Liver Function Tests: Recent Labs  Lab 05/14/24 1431  AST 13*  ALT 9  ALKPHOS 137*  BILITOT 1.2  PROT 6.1*  ALBUMIN  2.6*   No results for input(s): LIPASE, AMYLASE in the last 168 hours. Recent Labs  Lab 05/14/24 1800  AMMONIA <13   Coagulation Profile: Recent Labs  Lab 05/14/24 1431  INR 1.6*   Cardiac Enzymes: No results for input(s): CKTOTAL, CKMB, CKMBINDEX, TROPONINI in the last 168 hours. BNP (last 3 results) No results for  input(s): PROBNP in the last 8760 hours. HbA1C: No results for input(s): HGBA1C in the last 72 hours. CBG: Recent Labs  Lab 05/15/24 0429 05/15/24 0544 05/15/24 0632 05/15/24 0725 05/15/24 0832  GLUCAP 124* 120* 100* 114* 134*   Lipid Profile: No results for input(s): CHOL, HDL, LDLCALC, TRIG, CHOLHDL, LDLDIRECT in the last 72 hours. Thyroid  Function Tests: Recent Labs    05/14/24 1735 05/14/24 1800  TSH  --  5.607*  FREET4 0.88  --    Anemia Panel: Recent Labs    05/14/24 1800  VITAMINB12 565   Sepsis Labs: Recent Labs  Lab 05/14/24 1512 05/14/24 1647  LATICACIDVEN 2.0* 1.7    Recent Results (from the past 240 hours)  Culture, blood (Routine x 2)     Status: None (Preliminary result)   Collection Time: 05/14/24  2:28 PM   Specimen: BLOOD RIGHT HAND  Result Value Ref Range Status   Specimen Description BLOOD RIGHT HAND  Final   Special Requests   Final    BOTTLES DRAWN AEROBIC AND ANAEROBIC Blood Culture results may not be optimal due to an inadequate volume of blood received in culture bottles   Culture   Final    NO GROWTH < 24 HOURS Performed at [facility], [address] [place], [place] [address]    Report Status PENDING  Incomplete  Culture, blood (Routine x 2)     Status: None (Preliminary result)   Collection Time: 05/14/24  2:33 PM   Specimen: BLOOD LEFT HAND  Result Value Ref Range Status   Specimen Description BLOOD LEFT HAND  Final   Special Requests   Final    BOTTLES DRAWN AEROBIC AND ANAEROBIC Blood Culture results may not be optimal due to an inadequate volume of blood received in culture bottles   Culture   Final    NO GROWTH < 24 HOURS Performed at [facility], [address], [place], [place] [address]    Report Status PENDING  Incomplete  Surgical pcr screen     Status: None   Collection Time: 05/14/24  8:45 PM   Specimen: Nasal Mucosa; Nasal Swab  Result Value Ref Range Status   MRSA, PCR NEGATIVE  NEGATIVE Final   Staphylococcus aureus NEGATIVE NEGATIVE Final    Comment: (NOTE) The Xpert SA Assay (FDA approved for NASAL specimens in patients 18 years of age and older), is one component of a comprehensive surveillance program. It is not intended to diagnose infection nor to guide or monitor treatment. Performed at  [facility], [address], [place], [place] [address]          Radiology Studies: MR FOOT RIGHT WO CONTRAST Result Date: 05/15/2024 CLINICAL DATA:  Osteomyelitis, foot Diabetic foot ulcer. EXAM: MRI OF THE RIGHT HINDFOOT WITHOUT CONTRAST TECHNIQUE: Multiplanar, multisequence MR imaging of the right hindfoot was performed. No intravenous contrast was administered. COMPARISON:  Foot radiographs 05/14/2024 and 10/06/2023. FINDINGS: Technical note: Despite efforts by the technologist and patient, mild-to-moderate motion artifact is present on today's exam and could not be eliminated. This reduces exam sensitivity and specificity. Bones/Joint/Cartilage Apparent soft tissue ulceration along the posteromedial aspect of the calcaneal tuberosity, further described below. No underlying calcaneal cortical destruction or definite abnormal T1 marrow signal abnormality. There is low-level subcortical T2 hyperintensity in the calcaneal tuberosity. The remainder of the calcaneus appears normal. The talus, navicular, cuboid and cuneiform bones appear normal. No significant abnormality of the metatarsal bases identified. The 5th metatarsal head is not included on this examination of the hindfoot. No significant joint effusions or significant arthropathic changes. Ligaments Intact Lisfranc ligament. The major medial and lateral ankle ligaments appear intact. Muscles and Tendons The ankle tendons appear intact, without significant tenosynovitis. There is fluid within the flexor digitorum longus tendon sheath in the midfoot and proximal forefoot with surrounding muscular edema. Soft tissues As  above, apparent soft tissue ulceration along the posteromedial aspect the calcaneal tuberosity. Associated mild skin thickening and subcutaneous edema without organized fluid collection, foreign body or soft tissue emphysema. Mild thickening of the central cord of the plantar fascia without disruption. Mild generalized subcutaneous edema elsewhere in the hindfoot. IMPRESSION: 1. Apparent soft tissue ulceration along the posteromedial aspect of the calcaneal tuberosity with associated mild skin thickening and subcutaneous edema, suspicious for cellulitis. No evidence of soft tissue abscess or foreign body. 2. Low-level subcortical T2 hyperintensity in the calcaneal tuberosity without definite abnormal T1 marrow signal abnormality. This is nonspecific and could be reactive or secondary to early osteomyelitis. 3. Nonspecific fluid within the flexor digitorum longus tendon sheath in the midfoot and proximal forefoot with surrounding muscular edema. No significant ankle/hindfoot tenosynovitis. 4. The ankle tendons and ligaments appear intact. Electronically Signed   By: Elsie Perone M.D.   On: 05/15/2024 08:23   CT Head Wo Contrast Result Date: 05/14/2024 CLINICAL DATA:  Mental status change. Possible sepsis. Recent hip fracture. EXAM: CT HEAD WITHOUT CONTRAST TECHNIQUE: Contiguous axial images were obtained from the base of the skull through the vertex without intravenous contrast. RADIATION DOSE REDUCTION: This exam was performed according to the departmental dose-optimization program which includes automated exposure control, adjustment of the mA and/or kV according to patient size and/or use of iterative reconstruction technique. COMPARISON:  03/30/2024 FINDINGS: Brain: Ventricles, cisterns and other CSF spaces are normal. There is no mass, mass effect, shift of midline structures or acute hemorrhage. No evidence of acute infarction. There is chronic ischemic microvascular disease present. Vascular: No  hyperdense vessel or unexpected calcification. Skull: Normal. Negative for fracture or focal lesion. Sinuses/Orbits: Orbits and paranasal sinuses are unremarkable. Other: None. IMPRESSION: 1. No acute findings. 2. Chronic ischemic microvascular disease. Electronically Signed   By: Toribio Agreste M.D.   On: 05/14/2024 16:29   DG Foot Complete Right Result Date: 05/14/2024 CLINICAL DATA:  Foot ulcer. EXAM: RIGHT FOOT COMPLETE - 3+ VIEW COMPARISON:  None Available. FINDINGS: Suboptimal images due to patient condition. Mild erosive changes are seen along the lateral aspect of the fifth metatarsal head. Osteopenia. IMPRESSION: Erosive changes  are seen along the lateral aspect of the fifth metatarsal head, worrisome for osteomyelitis. Electronically Signed   By: Newell Eke M.D.   On: 05/14/2024 15:22   DG Chest Port 1 View Result Date: 05/14/2024 CLINICAL DATA:  Questionable sepsis. EXAM: PORTABLE CHEST 1 VIEW COMPARISON:  03/30/2024. FINDINGS: Trachea is midline. Heart size stable. Thoracic aorta is calcified. Streaky scarring or atelectasis in the left lower lobe. No airspace consolidation or pleural fluid. Linear metallic density overlying the left heart border is presumably external to the patient. IMPRESSION: Streaky scarring or atelectasis in the left lower lobe. Electronically Signed   By: Newell Eke M.D.   On: 05/14/2024 15:21           LOS: 1 day   Time spent= 44 mins    Deliliah Room, MD [facility]  If 7PM-7AM, please contact night-coverage  05/15/2024, 9:05 AM

## 2024-05-15 NOTE — Consult Note (Addendum)
 Palliative Medicine Inpatient Consult Note  Consulting Provider: Dr. Tobie  Reason for consult:   Palliative Care Consult Services Palliative Medicine Consult  Reason for Consult? goals of care   05/15/2024  HPI:  Per intake H&P -->  Patient with PMH of type II DM, HTN, hypothyroidism, HLD, recent right hip fracture glaucoma, GERD present to the hospital with complaints of confusion and lethargy. Palliative care is involved to support additional goals of care conversations.   Clinical Assessment/Goals of Care:  *Please note that this is a verbal dictation therefore any spelling or grammatical errors are due to the Dragon Medical One system interpretation.  I have reviewed medical records including EPIC notes, labs and imaging, received report from bedside RN, assessed the patient who is lying in bed disoriented.    I called and spoke with patient's daughter, Jennifer Bailey to further discuss diagnosis prognosis, GOC, EOL wishes, disposition and options.   I introduced Palliative Medicine as specialized medical care for people living with serious illness. It focuses on providing relief from the symptoms and stress of a serious illness. The goal is to improve quality of life for both the patient and the family.  Medical History Review and Understanding:  A review of Jennifer Bailey's past medical history significant for type 2 diabetes, hypertension, hypothyroidism, hyperlipidemia, recent right hip fracture with nail, glucoma, GERD, and Alzheimer's dementia was completed.  Social History:  Jennifer Bailey is from [facility] [place] .  She is a widow.  She had 3 daughters the 1 has passed away due to lung cancer.  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  She is a woman who is both shy and reserved but does enjoy shopping and social interaction.  She is a woman of the [employer].  Functional and Nutritional State:  Prior to hospitalization Jennifer Bailey was at [facility]  rehabilitation after her recent hip fracture.  Per her daughter she was not able to do much beyond sitting at the edge of the bed.  She now requires help with all B ADLs.  Secondary to this she developed ulcerations of her heels.  Her appetite has been variable.  Prior to patient's fall she was living in her home alone.  Her family would keep her active and take her out frequently.  Advance Directives:  A detailed discussion was had today regarding advanced directives.  Yes advance directives are on file and have written for reviewed.  Code Status:  Concepts specific to code status, artifical feeding and hydration, continued IV antibiotics and rehospitalization was had.  The difference between a aggressive medical intervention path  and a palliative comfort care path for this patient at this time was had.   Jennifer Bailey is an established DO NOT RESUSCITATE DO NOT INTUBATE CODE STATUS.  Discussion:  Jennifer Bailey and I reviewed how Jennifer Bailey has deteriorated since her last hospital stay.  I discussed with her the chronic disease trajectory. We reviewed the chronic disease trajectory in patients who have multiple co-morbidities. I shared that often an event will occur leading to an  acute hospitalizationsuch as a fall, UTI, PNA, heart failure exacerbation, copd exacerbation, or another illness sort. We discussed that patients may have been functioning at a high plateau initially, then an acute event occurs. We discussed that after this event their function, mental, and nutritional states are compromised. Often with treatment and rehabilitation there is some regain in each individuals health, though often not to their prior baseline level. We discussed that then another event  will occur causing a rehospitalization and a further decline. I shared that often this will become a pattern and each event causes greater burden to the individual, depleting them further or their function, cognition, or nutritional state.    Jennifer Bailey shares she has noticed a sharp decline since Jennifer Bailey has gone to [facility] rehabilitation and she and her sister do not feel that Jennifer Bailey is going to recover from her hip fracture.  Although she shares the hip itself has not heard her we reviewed the complication of the previous hospitalization and the decline in muscle mass which may have made it impossible for [facility] to further improve.  We discussed the options moving forward 1 would be to continue present care with hopes of improvement though also recognizing patient's deteriorating state and likelihood of a new baseline level of function.  We discussed the option of comfort oriented care. We talked about transition to comfort measures in house and what that would entail inclusive of medications to control pain, dyspnea, agitation, nausea, itching, and hiccups.  We discussed stopping all uneccessary measures such as cardiac monitoring, blood draws, needle sticks, and frequent vital signs.   We further discussed if comfort mediated care is selected we would optimize patient's pain through around-the-clock medications.  I emphasized this would alleviate pain that would make the patient more sleepy.  Jennifer Bailey shares that Jennifer Bailey has been sleepy for the most part throughout her whole rehabilitation journey so this would not be a great change for her.  We reviewed the idea of an inpatient hospice home to support Jennifer Bailey through her end-of-life care if comfort care is selected.  Jennifer Bailey would like time to speak to her sister, Jennifer Bailey though she is very understanding of her mother's acute on chronic disease burden and potential outcomes.  She and I plan to talk more move forward to further delineate goals of care.  Discussed the importance of continued conversation with family and their  medical providers regarding overall plan of care and treatment options, ensuring decisions are within the context of the patients values and  GOCs. __________________________________ Addendum:  I met with patients daughter, Jennifer Bailey this afternoon. She shares that she is having a good day with the patient. We discussed for the time being continuing present measures. While Jennifer Bailey shares she would not want her mother to suffer she is also struggling with the idea of removing some of her present interventions. Allowing time and space for her to express concerns.  Add time: 7  Decision Maker: Jennifer Bailey,Jennifer Bailey (Daughter): [phone] (Mobile)   SUMMARY OF RECOMMENDATIONS   DNAR/DNI  Open and honest conversations held with patient's daughter, Jennifer Bailey regarding the chronic disease trajectory  Discussed patient's new baseline level of function since her hip surgery and inability to rehabilitate well  Discussed options such as continuing present treatment or transitioning focus of care to comfort measures  Plan for ongoing palliative care conversations related to goals of care  Code Status/Advance Care Planning: DNAR/DNI   Palliative Prophylaxis:  Aspiration, Bowel Regimen, Delirium Protocol, Frequent Pain Assessment, Oral Care, Palliative Wound Care, and Turn Reposition  Additional Recommendations (Limitations, Scope, Preferences): Continue present measures  Psycho-social/Spiritual:  Desire for further Chaplaincy support: Patient is Methodist Additional Recommendations: Education on chronic disease burden   Prognosis: Limited overall hospice is appropriate.  Discharge Planning: To be determined.  Vitals:   05/15/24 0000 05/15/24 0306  BP: (!) 129/59 133/66  Pulse: 99 87  Resp: (!) 23 15  Temp: 97.6 F (36.4 C) 97.9 F (36.6  C)  SpO2: 99% 99%    Intake/Output Summary (Last 24 hours) at 05/15/2024 9292 Last data filed at 05/15/2024 0400 Gross per 24 hour  Intake 1395.3 ml  Output 175 ml  Net 1220.3 ml   Last Weight  Most recent update: 05/14/2024  5:21 PM    Weight  54.4 kg (120 lb)            Gen: Chronically  ill-appearing elderly Caucasian female HEENT: Dry mucous membranes CV: Regular rate and rhythm PULM: On room air breathing is even and nonlabored ABD: soft/nontender EXT: Pedal edema is present Neuro: Patient is aware of self though disoriented otherwise  PPS: 20%   This conversation/these recommendations were discussed with patient primary care team, Dr. Dino ______________________________________________________ [facility] Palliative Medicine Team Team Cell Phone: [phone] Please utilize secure chat with additional questions, if there is no response within 30 minutes please call the above phone number  Total Time: 75 Billing based on MDM: High  Palliative Medicine Team providers are available by phone from 7am to 7pm daily and can be reached through the team cell phone.  Should this patient require assistance outside of these hours, please call the patient's attending physician.

## 2024-05-15 NOTE — Progress Notes (Signed)
 Hypoglycemic Event  CBG: 50  Treatment: 25G dextrose  50% solution   Symptoms: Pale  Follow-up CBG: Time:1656 CBG Result:169  Possible Reasons for Event: Inadequate meal intake  Comments/MD notified:Jennifer Bailey    Jennifer Bailey

## 2024-05-15 NOTE — Consult Note (Signed)
 WOC Nurse Consult Note: Reason for Consult: pressure injuries  Wound type: 1.  Moisture Associated Skin Damage sacrum/coccyx with scattered partial thickness skin loss pink moist  ICD-10 CM Codes for Irritant Dermatitis L24A2 - Due to fecal, urinary or dual incontinence 2  Stage 2 Pressure Injuries medial buttocks/coccyx pink moist   3. L heel Stage 3 Pressure Injury 30% dark dry 50% tan 20% pink  4.  R   heel unstageable Pressure Injury with black necrotic tissue  5. Intertriginous dermatitis B inner thighs/groin erythema with partial thickness skin loss  ICD-10 CM Codes for Irritant Dermatitis  L30.4  - Erythema intertrigo. Also used for abrasion of the hand, chafing of the skin, dermatitis due to sweating and friction, friction dermatitis, friction eczema, and genital/thigh intertrigo.  Pressure Injury POA: Yes heels  Measurement:see nursing flowsheet  Wound bed: as above  Drainage (amount, consistency, odor) foul smelling to R heel per MD notes  Periwound: erythema  Dressing procedure/placement/frequency:  Cleanse sacrum/coccyx with Vashe wound cleanser Soila [phone]) do not rinse and allow to air dry. Apply Xeroform gauze (Lawsone #294) to wound beds daily and secure with ABD pad or silicone foam whichever is preferred.  Cleanse L heel with Vashe, apply Xeroform gauze (Lawson [phone]) to wound bed daily, secure with silicone foam or Kerlix roll gauze and place in Prevalon boot to offload pressure Soila [phone])  Cleanse R heel with Vashe wound cleanser, do not rinse.  Apply Vashe moistened gauze to wound bed daily, cover with dry gauze and secure with Kerlix roll gauze. Place R foot into Prevalon boot Soila [phone]) to offload pressure.   Cleanse inner thighs/groin/buttocks with soap and water, dry and apply Gerhardt's Butt Cream 3 times a day and prn soiling.    X-ray of R foot suspicious for osteomyelitis, MRI pending.  Osteomyelitis is outside the scope of the WOC nurse and  podiatry/ortho consult will be required to manage this.  Any wound care orders placed by ortho/podiatry supercede those placed by [facility] team.   POC discussed with bedside nurse. WOC team will not follow. Re-consult if further needs arise.   Thank you,    Powell Bar MSN, RN-BC, [employer]

## 2024-05-15 NOTE — Evaluation (Addendum)
 Clinical/Bedside Swallow Evaluation Patient Details  Name: Jennifer Bailey MRN: [id number] Date of Birth: 04/20/1935  Today's Date: 05/15/2024 Time: SLP Start Time (ACUTE ONLY): 0941 SLP Stop Time (ACUTE ONLY): 0954 SLP Time Calculation (min) (ACUTE ONLY): 13 min  Past Medical History:  Past Medical History:  Diagnosis Date   Counseling regarding goals of care 12/09/2020   Depression    Diabetes mellitus without complication (HCC)    Fall at home, initial encounter 12/02/2020   Family history of colon cancer- in her brother 07/09/2019   Family history of diabetes mellitus in brother 07/09/2019   Grief 12/24/2022   Hyperlipidemia associated with type 2 diabetes mellitus (HCC)    Hypertension associated with type 2 diabetes mellitus (HCC)    Memory changes    Osteoporosis    Polyneuropathy due to type 2 diabetes mellitus (HCC)    Pressure injury of skin of heel 12/09/2020   Thyroid  disease    Tibia/fibula fracture 12/02/2020   Tremulousness of voice 07/09/2019   Unsteady gait    Past Surgical History:  Past Surgical History:  Procedure Laterality Date   ABDOMINAL HYSTERECTOMY     CHOLECYSTECTOMY     COLON SURGERY     INTRAMEDULLARY (IM) NAIL INTERTROCHANTERIC Right 03/31/2024   Procedure: FIXATION, FRACTURE, INTERTROCHANTERIC, WITH INTRAMEDULLARY ROD;  Surgeon: Kendal Franky SQUIBB, MD;  Location: [facility] OR;  Service: Orthopedics;  Laterality: Right;   ORIF TIBIA PLATEAU Right 12/04/2020   Procedure: OPEN REDUCTION INTERNAL FIXATION (ORIF) TIBIAL PLATEAU;  Surgeon: Kendal Franky SQUIBB, MD;  Location: [facility] OR;  Service: Orthopedics;  Laterality: Right;   HPI:  Jennifer Bailey is an 89 yo female presenting to ED with severe sepsis secondary to R foot and abdominal wall/groin cellulitis. CXR negative. SLP evaluated swallowing July 2025 with prolonged mastication with recommendations for Dys 3 solids and thin liquids. PMH includes GERD, T2DM, HTN, Alzheimer's dementia, recent R hip fx     Assessment / Plan / Recommendation  Clinical Impression  Pt is awake and alert, pleasantly confused. Her upper dentures are unavailable but the lower dentures are firmly in place. She took consecutive sips of water without signs clinically concerning for aspiration. Pt required increased assistance for trials of purees and solids, with which oral transit appeared mildly prolonged. She took a liquid wash given cueing to clear minimal oral residue. Given fluctuating mentation and missing dentures, recommend starting with full liquids for now. Called her daughter, [name], to discuss recommendations and she states she will bring in pt's dentures later this date. Of note, [name] reports recent oral holding and pocketing noted by SNF staff and pt's family. Discussed that dysphagia secondary to cognitive factors is often typical for pts with dementia. SLP will f/u subsequent date once dentures are available to trial advanced solids. SLP Visit Diagnosis: Dysphagia, unspecified (R13.10)    Aspiration Risk  Mild aspiration risk    Diet Recommendation Thin liquid    Liquid Administration via: Spoon;Cup;Straw Medication Administration: Whole meds with puree Supervision: Staff to assist with self feeding;Full supervision/cueing for compensatory strategies Compensations: Minimize environmental distractions;Slow rate;Small sips/bites Postural Changes: Seated upright at 90 degrees;Remain upright for at least 30 minutes after po intake    Other  Recommendations Oral Care Recommendations: Oral care BID     Assistance Recommended at Discharge    Functional Status Assessment Patient has had a recent decline in their functional status and demonstrates the ability to make significant improvements in function in a reasonable and predictable amount of time.  Frequency and Duration min 2x/week  2 weeks       Prognosis Prognosis for improved oropharyngeal function: Good Barriers to Reach Goals: Cognitive deficits       Swallow Study   General HPI: Jennifer Bailey is an 89 yo female presenting to ED with severe sepsis secondary to R foot and abdominal wall/groin cellulitis. CXR negative. SLP evaluated swallowing July 2025 with prolonged mastication with recommendations for Dys 3 solids and thin liquids. PMH includes GERD, T2DM, HTN, Alzheimer's dementia, recent R hip fx Type of Study: Bedside Swallow Evaluation Previous Swallow Assessment: see HPI Diet Prior to this Study: NPO Temperature Spikes Noted: Yes Respiratory Status: Room air History of Recent Intubation: No Behavior/Cognition: Alert;Cooperative Oral Cavity Assessment: Within Functional Limits Oral Care Completed by SLP: No Oral Cavity - Dentition: Dentures, top;Dentures, bottom;Dentures, not available Vision: Functional for self-feeding Self-Feeding Abilities: Able to feed self Patient Positioning: Upright in bed Baseline Vocal Quality: Normal Volitional Cough: Cognitively unable to elicit Volitional Swallow: Able to elicit    Oral/Motor/Sensory Function Overall Oral Motor/Sensory Function: Within functional limits   Ice Chips Ice chips: Not tested   Thin Liquid Thin Liquid: Within functional limits Presentation: Straw;Self Fed    Nectar Thick Nectar Thick Liquid: Not tested   Honey Thick Honey Thick Liquid: Not tested   Puree Puree: Within functional limits Presentation: Spoon   Solid     Solid: Impaired Presentation: Spoon Oral Phase Impairments: Impaired mastication      Damien Blumenthal, M.A., CCC-SLP Speech Language Pathology, Acute Rehabilitation Services  Secure Chat preferred [phone]  05/15/2024,10:13 AM

## 2024-05-16 DIAGNOSIS — R6520 Severe sepsis without septic shock: Secondary | ICD-10-CM | POA: Diagnosis not present

## 2024-05-16 DIAGNOSIS — A419 Sepsis, unspecified organism: Secondary | ICD-10-CM | POA: Diagnosis not present

## 2024-05-16 LAB — BLOOD CULTURE ID PANEL (REFLEXED) - BCID2

## 2024-05-16 LAB — CULTURE, BLOOD (ROUTINE X 2)

## 2024-05-16 LAB — GLUCOSE, CAPILLARY: Glucose-Capillary: 84 mg/dL (ref 70–99)

## 2024-05-16 MED ORDER — DIAZEPAM 5 MG/ML IJ SOLN
2.5000 mg | INTRAMUSCULAR | Status: DC
  Administered 2024-05-16 – 2024-05-17 (×14): 2.5 mg via INTRAVENOUS
  Filled 2024-05-16 (×7): qty 2

## 2024-05-16 MED ORDER — GLYCOPYRROLATE 1 MG PO TABS: 1.0000 mg | ORAL_TABLET | ORAL | Status: DC | PRN

## 2024-05-16 MED ORDER — LORAZEPAM 0.5 MG PO TABS
0.5000 mg | ORAL_TABLET | ORAL | Status: DC | PRN
  Administered 2024-05-16 (×2): 0.5 mg via ORAL
  Filled 2024-05-16: qty 1

## 2024-05-16 MED ORDER — MORPHINE SULFATE (PF) 2 MG/ML IV SOLN
2.0000 mg | INTRAVENOUS | Status: DC | PRN
  Administered 2024-05-16 – 2024-05-17 (×10): 2 mg via INTRAVENOUS
  Filled 2024-05-16 (×4): qty 1

## 2024-05-16 MED ORDER — GLYCOPYRROLATE 0.2 MG/ML IJ SOLN
0.2000 mg | INTRAMUSCULAR | Status: DC | PRN
  Filled 2024-05-16: qty 1

## 2024-05-16 MED ORDER — GLYCOPYRROLATE 0.2 MG/ML IJ SOLN: 0.2000 mg | INTRAMUSCULAR | Status: DC | PRN

## 2024-05-16 MED ORDER — DIAZEPAM 5 MG/ML IJ SOLN
2.0000 mg | Freq: Once | INTRAMUSCULAR | Status: AC
  Administered 2024-05-16 (×2): 2 mg via INTRAVENOUS
  Filled 2024-05-16: qty 2

## 2024-05-16 MED ORDER — INSULIN ASPART 100 UNIT/ML IJ SOLN: 0.0000 [IU] | INTRAMUSCULAR | Status: DC

## 2024-05-16 MED ORDER — BIOTENE DRY MOUTH MT LIQD: 15.0000 mL | OROMUCOSAL | Status: DC | PRN

## 2024-05-16 MED ORDER — DIAZEPAM 5 MG/ML IJ SOLN
2.5000 mg | Freq: Once | INTRAMUSCULAR | Status: AC
  Administered 2024-05-16 (×2): 2.5 mg via INTRAMUSCULAR
  Filled 2024-05-16: qty 2

## 2024-05-16 MED ORDER — POLYVINYL ALCOHOL 1.4 % OP SOLN: 1.0000 [drp] | Freq: Four times a day (QID) | OPHTHALMIC | Status: DC | PRN

## 2024-05-16 MED ORDER — MORPHINE SULFATE (PF) 2 MG/ML IV SOLN
2.0000 mg | INTRAVENOUS | Status: DC
  Administered 2024-05-16 – 2024-05-17 (×12): 2 mg via INTRAVENOUS
  Filled 2024-05-16 (×7): qty 1

## 2024-05-16 NOTE — Inpatient Diabetes Management (Signed)
 Inpatient Diabetes Program Recommendations  AACE/ADA: New Consensus Statement on Inpatient Glycemic Control   Target Ranges:  Prepandial:   less than 140 mg/dL      Peak postprandial:   less than 180 mg/dL (1-2 hours)      Critically ill patients:  140 - 180 mg/dL    Latest Reference Range & Units 05/15/24 08:32 05/15/24 09:21 05/15/24 10:26 05/15/24 11:33 05/15/24 16:15 05/15/24 16:56 05/15/24 21:04 05/16/24 06:07  Glucose-Capillary 70 - 99 mg/dL 865 (H) 879 (H) 824 (H) 161 (H) 50 (L) 169 (H) 68 (L) 84   Review of Glycemic Control  Diabetes history: DM2 Outpatient Diabetes medications: Tresiba  15 units at bedtime, Novolog  0-15 units TID with meals, Humalog  3 units TID,  Janumet  50-500 mg BID Current orders for Inpatient glycemic control: Semglee  10 units daily, Novolog  0-9 units TID with meals, Novolog  0-5 units QHS  Inpatient Diabetes Program Recommendations:    Insulin : CBG 84 mg/dl at 3:92 today and down to 50 mg/dl at 83:84 on 1/88/74. Please consider decreasing Semglee  to 8 units daily and Novolog  correction to 0-6 units TID with meals.  Thanks, Earnie Gainer, RN, MSN, CDCES Diabetes Coordinator Inpatient Diabetes Program [phone] (Team Pager from 8am to 5pm)

## 2024-05-16 NOTE — Progress Notes (Signed)
 Gave report to nurse Donny at [facility]. All questions answered.

## 2024-05-16 NOTE — Progress Notes (Signed)
   Palliative Medicine Inpatient Follow Up Note HPI: Patient with PMH of type II DM, HTN, hypothyroidism, HLD, recent right hip fracture glaucoma, GERD present to the hospital with complaints of confusion and lethargy. Palliative care is involved to support additional goals of care conversations.   Today's Discussion 05/16/2024  *Please note that this is a verbal dictation therefore any spelling or grammatical errors are due to the Dragon Medical One system interpretation.  Chart reviewed inclusive of vital signs, progress notes, laboratory results, and diagnostic images.   I met with Adaley's daughter, [name] at bedside.  She shares that her mother had had a very difficult night and made various statements indicating she is ready to pass on.    [name] and her sister, Verneita agree to pursue comfort focused care.  I reiterated to [name] what that would entail inclusive of stopping active medications such as IV antibiotics, maintenance IV fluid, cardiac medications, and instituting medications that alleviate distressing symptoms.  We reviewed Delainey's greatest symptom right now is her anxiety.  We discussed placing her on medicine around-the-clock to support anxiety management.  For the time being the goal will be to optimize Maxie's symptoms.  We reviewed if Rilla should be stable transitioning her to an inpatient hospice home.  Patient's daughter has a preference towards [facility] given proximity.    We reflected on Doraine's life and what a wonderful person she is.  We discussed her role as a grandmother and great-grandmother.  We reviewed what a beautiful day yesterday was and I educated patient's daughter [name] on the rally that typically occurs with patients before they have a sharp decline towards end-of-life.  Allow [name] time and space to express her concerns given the difficulty of the situation with her mother.  Provided support through therapeutic listening.  Questions and  concerns addressed/Palliative Support Provided.   Objective Assessment: Vital Signs Vitals:   05/16/24 0330 05/16/24 0746  BP: (!) 102/44 (!) 166/64  Pulse: 75   Resp: 14 17  Temp: 97.8 F (36.6 C) 97.6 F (36.4 C)  SpO2: 98%     Intake/Output Summary (Last 24 hours) at 05/16/2024 1055 Last data filed at 05/16/2024 9185 Gross per 24 hour  Intake 1526.94 ml  Output 750 ml  Net 776.94 ml   Last Weight  Most recent update: 05/14/2024  5:21 PM    Weight  54.4 kg (120 lb)            Gen: Chronically ill-appearing elderly Caucasian female HEENT: Dry mucous membranes CV: Regular rate and rhythm PULM: On room air breathing is even and nonlabored ABD: soft/nontender EXT: Pedal edema is present Neuro: Anxious - difficulty in focusing on topic  SUMMARY OF RECOMMENDATIONS   DNAR/DNI   Comfort care  Have added low-dose diazepam  and morphine  around-the-clock  Additional comfort medications per [facility]  Unrestricted visitation  Appreciate TOC making referral to [facility] liaison's for inpatient hospice consideration  Ongoing palliative care support ______________________________________________________________________________________ Rosaline Becton [place] Palliative Medicine Team Team Cell Phone: [phone] Please utilize secure chat with additional questions, if there is no response within 30 minutes please call the above phone number  Time Spent: 65 Billing based on MDM: High  Palliative Medicine Team providers are available by phone from 7am to 7pm daily and can be reached through the team cell phone.  Should this patient require assistance outside of these hours, please call the patient's attending physician.

## 2024-05-16 NOTE — Plan of Care (Signed)
  Problem: Metabolic: Goal: Ability to maintain appropriate glucose levels will improve 05/16/2024 0613 by Briant Lacinda SAILOR, RN Outcome: Progressing 05/16/2024 0613 by Briant Lacinda SAILOR, RN Outcome: Progressing   Problem: Skin Integrity: Goal: Risk for impaired skin integrity will decrease 05/16/2024 0613 by Briant Lacinda SAILOR, RN Outcome: Progressing 05/16/2024 0613 by Briant Lacinda SAILOR, RN Outcome: Progressing   Problem: Tissue Perfusion: Goal: Adequacy of tissue perfusion will improve 05/16/2024 0613 by Briant Lacinda SAILOR, RN Outcome: Progressing 05/16/2024 0613 by Briant Lacinda SAILOR, RN Outcome: Progressing   Problem: Cardiac: Goal: Ability to maintain an adequate cardiac output will improve 05/16/2024 0613 by Briant Lacinda SAILOR, RN Outcome: Progressing 05/16/2024 0613 by Briant Lacinda SAILOR, RN Outcome: Progressing

## 2024-05-16 NOTE — TOC Progression Note (Signed)
 Transition of Care [facility]) - Progression Note    Patient Details  Name: Jennifer Bailey MRN: [id number] Date of Birth: September 01, 1935  Transition of Care [facility]) CM/SW Contact  Lauraine FORBES Saa, LCSWA Phone Number: 05/16/2024, 11:39 AM  Clinical Narrative:     11:39 AM Per Palliative Care NP, patient has transitioned to comfort care and is a potential candidate for residential hospice. Palliative Care NP informed CSW that patient's family expressed preference in [facility] due to location. [facility] is to evaluate patient for residential hospice admission. Medical team made aware. CSW will continue to follow and be available to assist.  Expected Discharge Plan: Hospice Medical Facility Barriers to Discharge: Other (must enter comment) (Residential Hospice Evaluation)               Expected Discharge Plan and Services In-house Referral: Clinical Social Work   Post Acute Care Choice: Hospice Living arrangements for the past 2 months: Single Family Home, Skilled Nursing Facility                                       Social Drivers of Health (SDOH) Interventions SDOH Screenings   Food Insecurity: No Food Insecurity (03/31/2024)  Housing: Low Risk  (03/31/2024)  Transportation Needs: No Transportation Needs (03/31/2024)  Utilities: Not At Risk (03/31/2024)  Depression (PHQ2-9): Low Risk  (02/07/2024)  Financial Resource Strain: Low Risk  (02/16/2023)  Physical Activity: Sufficiently Active (02/16/2023)  Social Connections: Moderately Isolated (03/31/2024)  Stress: No Stress Concern Present (02/16/2023)  Tobacco Use: Low Risk  (05/14/2024)    Readmission Risk Interventions     No data to display

## 2024-05-16 NOTE — Progress Notes (Signed)
 PHARMACY - PHYSICIAN COMMUNICATION CRITICAL VALUE ALERT - BLOOD CULTURE IDENTIFICATION (BCID)  Jennifer Bailey is an 89 y.o. female who presented to [facility] on 05/14/2024 with a chief complaint of AMS/sepsis/cellulitis  Assessment: 1/2 blood cultures growing Staph species  Name of physician (or Provider) Contacted:  Dr. Franky  Current antibiotics:  Vancomycin  and Cefepime    Changes to prescribed antibiotics recommended:  No changes at this time  Results for orders placed or performed during the hospital encounter of 05/14/24  Blood Culture ID Panel (Reflexed) (Collected: 05/14/2024  2:33 PM)  Result Value Ref Range   Enterococcus faecalis NOT DETECTED NOT DETECTED   Enterococcus Faecium NOT DETECTED NOT DETECTED   Listeria monocytogenes NOT DETECTED NOT DETECTED   Staphylococcus species DETECTED (A) NOT DETECTED   Staphylococcus aureus (BCID) NOT DETECTED NOT DETECTED   Staphylococcus epidermidis NOT DETECTED NOT DETECTED   Staphylococcus lugdunensis NOT DETECTED NOT DETECTED   Streptococcus species NOT DETECTED NOT DETECTED   Streptococcus agalactiae NOT DETECTED NOT DETECTED   Streptococcus pneumoniae NOT DETECTED NOT DETECTED   Streptococcus pyogenes NOT DETECTED NOT DETECTED   A.calcoaceticus-baumannii NOT DETECTED NOT DETECTED   Bacteroides fragilis NOT DETECTED NOT DETECTED   Enterobacterales NOT DETECTED NOT DETECTED   Enterobacter cloacae complex NOT DETECTED NOT DETECTED   Escherichia coli NOT DETECTED NOT DETECTED   Klebsiella aerogenes NOT DETECTED NOT DETECTED   Klebsiella oxytoca NOT DETECTED NOT DETECTED   Klebsiella pneumoniae NOT DETECTED NOT DETECTED   Proteus species NOT DETECTED NOT DETECTED   Salmonella species NOT DETECTED NOT DETECTED   Serratia marcescens NOT DETECTED NOT DETECTED   Haemophilus influenzae NOT DETECTED NOT DETECTED   Neisseria meningitidis NOT DETECTED NOT DETECTED   Pseudomonas aeruginosa NOT DETECTED NOT DETECTED    Stenotrophomonas maltophilia NOT DETECTED NOT DETECTED   Candida albicans NOT DETECTED NOT DETECTED   Candida auris NOT DETECTED NOT DETECTED   Candida glabrata NOT DETECTED NOT DETECTED   Candida krusei NOT DETECTED NOT DETECTED   Candida parapsilosis NOT DETECTED NOT DETECTED   Candida tropicalis NOT DETECTED NOT DETECTED   Cryptococcus neoformans/gattii NOT DETECTED NOT DETECTED    Jennifer Bailey 05/16/2024  2:27 AM

## 2024-05-16 NOTE — TOC Transition Note (Addendum)
 Transition of Care [facility]) - Discharge Note   Patient Details  Name: Jennifer Bailey MRN: [id number] Date of Birth: 1934-11-23  [facility]) CM/SW Contact:  Lauraine FORBES Saa, LCSWA Phone Number: 05/16/2024, 4:35 PM   Clinical Narrative:     Patient will DC to: [facility] Anticipated DC date: 05/16/2024 Family notified: [name] Mainland, Daughter; [phone] Transport by: [place]   Per [facility] Liasion, patient is approved for [facility] and has bed availability. Per MD patient ready for DC to [facility] RN to call report prior to discharge [phone]). RN, patient's family, and facility notified of DC (patient is not fully oriented).DC packet on chart. Ambulance transport requested for patient at 16:34 (patient placed on will call list as requested by [facility]).   CSW will sign off for now as social work intervention is no longer needed. Please consult us  again if new needs arise.    Final next level of care: Hospice Medical Facility Barriers to Discharge: Barriers Resolved   Patient Goals and CMS Choice            Discharge Placement              Patient chooses bed at: Other - please specify in the comment section below: [facility]) Patient to be transferred to facility by: [facility] Name of family member notified: [name] Mainland; Daughter; [phone] Patient and family notified of of transfer: 05/16/24  Discharge Plan and Services Additional resources added to the After Visit Summary for   In-house Referral: Clinical Social Work   Post Acute Care Choice: Hospice                               Social Drivers of Health (SDOH) Interventions SDOH Screenings   Food Insecurity: No Food Insecurity (03/31/2024)  Housing: Low Risk  (03/31/2024)  Transportation Needs: No Transportation Needs (03/31/2024)  Utilities: Not At Risk (03/31/2024)  Depression (PHQ2-9): Low Risk  (02/07/2024)  Financial Resource Strain: Low Risk   (02/16/2023)  Physical Activity: Sufficiently Active (02/16/2023)  Social Connections: Moderately Isolated (03/31/2024)  Stress: No Stress Concern Present (02/16/2023)  Tobacco Use: Low Risk  (05/14/2024)     Readmission Risk Interventions     No data to display

## 2024-05-16 NOTE — Progress Notes (Signed)
 [facility] 2C16 [facility] Liaison Note  Received request from [facility] manager for family interest in [facility]. Eligibility confirmed. Met with patient and spoke with daughter by phone to confirm interest and explain services. Family agreeable to transfer today. TOC aware. RN please call report to [phone] prior to patient leaving the unit. Please send signed DNR with patient at discharge.   Thank you for allowing us  to participate in this patient's care.  Eleanor Nail, LPN [facility] Liaison [phone]

## 2024-05-16 NOTE — Discharge Summary (Signed)
 Physician Discharge Summary   Patient: Jennifer Bailey MRN: [id number] DOB: 03/21/1935  Admit date:     05/14/2024  Discharge date: 05/16/24  Discharge Physician: Yetta Blanch  PCP: Oris Camie BRAVO, NP  Recommendations at discharge: Establish care with residential hospice.  Discharge Diagnoses: Principal Problem:   Sepsis (HCC) Active Problems:   Hyperlipidemia associated with type 2 diabetes mellitus (HCC)   Diabetic polyneuropathy associated with type 2 diabetes mellitus (HCC)   Acquired autoimmune hypothyroidism   Glaucoma   Dementia without behavioral disturbance, psychotic disturbance, mood disturbance, or anxiety (HCC)   Malnutrition of moderate degree   Foot osteomyelitis, right (HCC)   Abdominal wall cellulitis   Acute metabolic encephalopathy   AKI (acute kidney injury) (HCC)   DKA (diabetic ketoacidosis) [facility])  Hospital Course: Patient with PMH of type II DM, HTN, hypothyroidism, HLD, recent right hip fracture glaucoma, GERD present to the hospital with complaints of confusion and lethargy. Patient was recently hospitalized 6/26 to 7/10 for right hip fracture.  Underwent cephalomedullary nailing with Dr. Kendal. 7/14 brought him to the ED for black stool. This admission, family reports that for past 2 weeks patient has been more lethargic and not being herself at the facility. She also has sustained pressure ulcers of bilateral heels which per family progressively appears to be worsening. Was found to have DKA, severe sepsis due to cellulitis, AKI, and acute encephalopathy as well as early right foot osteomyelitis. Palliative care was consulted for further condition about goals of care.  And family decided oriented to complete comfort.  Residential hospice was recommended.  Patient currently stable for transfer to residential hospice.  Assessment and Plan: Severe sepsis secondary to cellulitis, right calcaneal osteomyelitis. Presents with progressively worsening  confusion and lethargy. Also has abdominal wall cellulitis as well as cellulitis involving her groin area as well. With leukocytosis and tachypnea as well as tachycardia meeting SIRS criteria. With hypotension, lactic acidosis, AKI as well as encephalopathy meeting severe sepsis criteria. Initiated on IV antibiotics.  Cultures were performed.  So far no growth. X-ray negative for any acute abnormality. Urine catheter was changed. MRI right foot shows evidence of cellulitis without any abscess with osteomyelitis of the calcaneum. Now comfort care.  DKA. Type 2 diabetes mellitus, uncontrolled with hyperglycemia with neuropathy with long-term insulin  use. Blood sugars elevated to 400. Anion gap 16. CO2 19. Lactic acid is only 2. VBG is reassuring 7.386 pH.  Urine has some ketones. Beta-hydroxybutyrate acid 3.9. Initiated on IV insulin  therapy with DKA protocol. Now comfort care.  Acute metabolic encephalopathy. Multifactorial in the setting of sepsis as well as DKA. CT head negative. Family reports right-sided weakness for last 2 weeks given negative CT head less likely stroke. Unremarkable TSH, free T4, cortisol, ammonia, B12.  Recent right hip fracture. Treated with cephalomedullary nailing. Surgical wound appears to also have some redness.  Hypothyroidism. Continue Synthroid . Mildly elevated TSH and normal free T4.  Dementia. Now with delirium. Also developed delirium during her last hospitalization. Prognosis is poor. Not on any medication. Monitor for now.  Goals of care conversation. EDP discussed with the family. I also discussed with the family with regards to patient's poor prognosis. Daughter who is the POA at bedside feels that the patient would not want any aggressive measures. Patient currently DNR/DNI per family recommendation. Palliative care was consulted. After conversation with the palliative care family decided to transition the patient to complete  comfort on 8/12. Residential hospice was recommended.  [facility] was selected  by the family.  HTN. Now with hypotension in the setting of sepsis. Responded to IV fluid. Holding losartan  and amlodipine .  On anticoagulation. Started on Eliquis  for DVT prophylaxis after her recent hospitalization for hip surgery.  Consultants:  Palliative care   Procedures performed:  none  DISCHARGE MEDICATION: Allergies as of 05/16/2024       Reactions   Macrodantin [nitrofurantoin Macrocrystal] Swelling   Prednisone    Make real shaky-like, nervious   Penicillins Rash   ** Tolerates cephalosporins        Medication List     STOP taking these medications    acetaminophen  325 MG tablet Commonly known as: TYLENOL    alendronate  70 MG tablet Commonly known as: FOSAMAX    amLODipine  5 MG tablet Commonly known as: NORVASC    apixaban  2.5 MG Tabs tablet Commonly known as: Eliquis    bisacodyl  10 MG suppository Commonly known as: DULCOLAX   brimonidine  0.2 % ophthalmic solution Commonly known as: ALPHAGAN    cyclobenzaprine 5 MG tablet Commonly known as: FLEXERIL   dorzolamidel-timolol  22.3-6.8 MG/ML Soln ophthalmic solution Commonly known as: COSOPT    ENEMA RE   feeding supplement (GLUCERNA SHAKE) Liqd   feeding supplement (PRO-STAT 64) Liqd   furosemide  20 MG tablet Commonly known as: LASIX    gabapentin  100 MG capsule Commonly known as: NEURONTIN    Gvoke HypoPen  1-Pack 1 MG/0.2ML Soaj Generic drug: Glucagon    ibuprofen 200 MG tablet Commonly known as: ADVIL   insulin  aspart 100 UNIT/ML injection Commonly known as: novoLOG    insulin  lispro 100 UNIT/ML KwikPen Commonly known as: HUMALOG    Janumet  50-500 MG tablet Generic drug: sitaGLIPtin-metformin   levothyroxine  100 MCG tablet Commonly known as: SYNTHROID    LORazepam  0.5 MG tablet Commonly known as: ATIVAN    lovastatin  40 MG tablet Commonly known as: MEVACOR    Milk of Magnesia 1200 MG/15ML  suspension Generic drug: magnesium hydroxide   Omega-3 1000 MG Caps   oxyCODONE  5 MG immediate release tablet Commonly known as: Oxy IR/ROXICODONE    polyethylene glycol 17 g packet Commonly known as: MIRALAX  / GLYCOLAX    PreserVision AREDS 2 Caps   promethazine  25 MG tablet Commonly known as: PHENERGAN    senna-docusate 8.6-50 MG tablet Commonly known as: Senokot-S   tamsulosin  0.4 MG Caps capsule Commonly known as: FLOMAX    traZODone  50 MG tablet Commonly known as: DESYREL    Tresiba  FlexTouch 100 UNIT/ML FlexTouch Pen Generic drug: insulin  degludec   vitamin D3 25 MCG tablet Commonly known as: CHOLECALCIFEROL        Disposition: Residential hospice Diet recommendation: Full liquid diet  Discharge Exam: Vitals:   05/15/24 2333 05/16/24 0200 05/16/24 0330 05/16/24 0746  BP: (!) 96/45 (!) 143/63 (!) 102/44 (!) 166/64  Pulse: 71 74 75   Resp: 16  14 17   Temp: 98 F (36.7 C)  97.8 F (36.6 C) 97.6 F (36.4 C)  TempSrc: Axillary  Axillary Oral  SpO2: 98% 100% 98%   Weight:      Height:       Alert and oriented to self. Later on was drowsy. Appears in severe distress. S1-S2 present. Clear to auscultation.  Filed Weights   05/14/24 1721  Weight: 54.4 kg   Condition at discharge: stable  The results of significant diagnostics from this hospitalization (including imaging, microbiology, ancillary and laboratory) are listed below for reference.   Imaging Studies: VAS US  ABI WITH/WO TBI Result Date: 05/15/2024  LOWER EXTREMITY DOPPLER STUDY Patient Name:  SRIHITHA TAGLIAFERRI  Date of Exam:   05/15/2024  Medical Rec #: [id number]          Accession #:    [id number] Date of Birth: 10-02-1935          Patient Gender: F Patient Age:   89 years Exam Location:  [facility] Procedure:      VAS US  ABI WITH/WO TBI Referring Phys: Yousaf Sainato --------------------------------------------------------------------------------  Indications: Peripheral artery disease. High  Risk Factors: Hypertension, hyperlipidemia, Diabetes.  Limitations: Today's exam was limited due to patient positioning. Comparison Study: No prior Performing Technologist: Jimmye Scarce RVT  Examination Guidelines: A complete evaluation includes at minimum, Doppler waveform signals and systolic blood pressure reading at the level of bilateral brachial, anterior tibial, and posterior tibial arteries, when vessel segments are accessible. Bilateral testing is considered an integral part of a complete examination. Photoelectric Plethysmograph (PPG) waveforms and toe systolic pressure readings are included as required and additional duplex testing as needed. Limited examinations for reoccurring indications may be performed as noted.  ABI Findings: +--------+------------------+-----+--------+-----------------------------------+ Right   Rt Pressure (mmHg)IndexWaveformComment                             +--------+------------------+-----+--------+-----------------------------------+ Brachial                               IV                                  +--------+------------------+-----+--------+-----------------------------------+ PTA                                    unable to access due to pt foot                                            turned inward and is in pain when                                          attempt to position it              +--------+------------------+-----+--------+-----------------------------------+ DP      125               0.95 biphasic                                    +--------+------------------+-----+--------+-----------------------------------+ +--------+------------------+-----+---------+-------+ Left    Lt Pressure (mmHg)IndexWaveform Comment +--------+------------------+-----+---------+-------+ [id number]                    triphasic        +--------+------------------+-----+---------+-------+ PTA     126               0.95  biphasic         +--------+------------------+-----+---------+-------+ DP      122               0.92 biphasic         +--------+------------------+-----+---------+-------+ +-------+-----------+-----------+------------+------------+ ABI/TBIToday's ABIToday's TBIPrevious ABIPrevious TBI +-------+-----------+-----------+------------+------------+ Right  0.95                                           +-------+-----------+-----------+------------+------------+  Left   0.95                                           +-------+-----------+-----------+------------+------------+  Summary: Right: Resting right ankle-brachial index is within normal range. Left: Resting left ankle-brachial index is within normal range. *See table(s) above for measurements and observations.  Electronically signed by Fonda Rim on 05/15/2024 at 6:50:57 PM.    Final    MR FOOT RIGHT WO CONTRAST Result Date: 05/15/2024 CLINICAL DATA:  Osteomyelitis, foot Diabetic foot ulcer. EXAM: MRI OF THE RIGHT HINDFOOT WITHOUT CONTRAST TECHNIQUE: Multiplanar, multisequence MR imaging of the right hindfoot was performed. No intravenous contrast was administered. COMPARISON:  Foot radiographs 05/14/2024 and 10/06/2023. FINDINGS: Technical note: Despite efforts by the technologist and patient, mild-to-moderate motion artifact is present on today's exam and could not be eliminated. This reduces exam sensitivity and specificity. Bones/Joint/Cartilage Apparent soft tissue ulceration along the posteromedial aspect of the calcaneal tuberosity, further described below. No underlying calcaneal cortical destruction or definite abnormal T1 marrow signal abnormality. There is low-level subcortical T2 hyperintensity in the calcaneal tuberosity. The remainder of the calcaneus appears normal. The talus, navicular, cuboid and cuneiform bones appear normal. No significant abnormality of the metatarsal bases identified. The 5th metatarsal head is not  included on this examination of the hindfoot. No significant joint effusions or significant arthropathic changes. Ligaments Intact Lisfranc ligament. The major medial and lateral ankle ligaments appear intact. Muscles and Tendons The ankle tendons appear intact, without significant tenosynovitis. There is fluid within the flexor digitorum longus tendon sheath in the midfoot and proximal forefoot with surrounding muscular edema. Soft tissues As above, apparent soft tissue ulceration along the posteromedial aspect the calcaneal tuberosity. Associated mild skin thickening and subcutaneous edema without organized fluid collection, foreign body or soft tissue emphysema. Mild thickening of the central cord of the plantar fascia without disruption. Mild generalized subcutaneous edema elsewhere in the hindfoot. IMPRESSION: 1. Apparent soft tissue ulceration along the posteromedial aspect of the calcaneal tuberosity with associated mild skin thickening and subcutaneous edema, suspicious for cellulitis. No evidence of soft tissue abscess or foreign body. 2. Low-level subcortical T2 hyperintensity in the calcaneal tuberosity without definite abnormal T1 marrow signal abnormality. This is nonspecific and could be reactive or secondary to early osteomyelitis. 3. Nonspecific fluid within the flexor digitorum longus tendon sheath in the midfoot and proximal forefoot with surrounding muscular edema. No significant ankle/hindfoot tenosynovitis. 4. The ankle tendons and ligaments appear intact. Electronically Signed   By: Elsie Perone M.D.   On: 05/15/2024 08:23   CT Head Wo Contrast Result Date: 05/14/2024 CLINICAL DATA:  Mental status change. Possible sepsis. Recent hip fracture. EXAM: CT HEAD WITHOUT CONTRAST TECHNIQUE: Contiguous axial images were obtained from the base of the skull through the vertex without intravenous contrast. RADIATION DOSE REDUCTION: This exam was performed according to the departmental  dose-optimization program which includes automated exposure control, adjustment of the mA and/or kV according to patient size and/or use of iterative reconstruction technique. COMPARISON:  03/30/2024 FINDINGS: Brain: Ventricles, cisterns and other CSF spaces are normal. There is no mass, mass effect, shift of midline structures or acute hemorrhage. No evidence of acute infarction. There is chronic ischemic microvascular disease present. Vascular: No hyperdense vessel or unexpected calcification. Skull: Normal. Negative for fracture or focal lesion. Sinuses/Orbits: Orbits and paranasal sinuses are unremarkable. Other: None. IMPRESSION: 1. No acute  findings. 2. Chronic ischemic microvascular disease. Electronically Signed   By: Toribio Agreste M.D.   On: 05/14/2024 16:29   DG Foot Complete Right Result Date: 05/14/2024 CLINICAL DATA:  Foot ulcer. EXAM: RIGHT FOOT COMPLETE - 3+ VIEW COMPARISON:  None Available. FINDINGS: Suboptimal images due to patient condition. Mild erosive changes are seen along the lateral aspect of the fifth metatarsal head. Osteopenia. IMPRESSION: Erosive changes are seen along the lateral aspect of the fifth metatarsal head, worrisome for osteomyelitis. Electronically Signed   By: Newell Eke M.D.   On: 05/14/2024 15:22   DG Chest Port 1 View Result Date: 05/14/2024 CLINICAL DATA:  Questionable sepsis. EXAM: PORTABLE CHEST 1 VIEW COMPARISON:  03/30/2024. FINDINGS: Trachea is midline. Heart size stable. Thoracic aorta is calcified. Streaky scarring or atelectasis in the left lower lobe. No airspace consolidation or pleural fluid. Linear metallic density overlying the left heart border is presumably external to the patient. IMPRESSION: Streaky scarring or atelectasis in the left lower lobe. Electronically Signed   By: Newell Eke M.D.   On: 05/14/2024 15:21   CT ABDOMEN PELVIS W CONTRAST Result Date: 04/17/2024 CLINICAL DATA:  Acute abdominal pain EXAM: CT ABDOMEN AND PELVIS WITH  CONTRAST TECHNIQUE: Multidetector CT imaging of the abdomen and pelvis was performed using the standard protocol following bolus administration of intravenous contrast. RADIATION DOSE REDUCTION: This exam was performed according to the departmental dose-optimization program which includes automated exposure control, adjustment of the mA and/or kV according to patient size and/or use of iterative reconstruction technique. CONTRAST:  75mL OMNIPAQUE  IOHEXOL  350 MG/ML SOLN COMPARISON:  04/12/2024 FINDINGS: Lower chest: Lung bases are free of acute infiltrate or sizable effusion. No parenchymal nodule is seen. Hepatobiliary: Gallbladder has been surgically removed. Liver is within normal limits. Biliary ductal dilatation is seen consistent with the post cholecystectomy state. Pancreas: Unremarkable. No pancreatic ductal dilatation or surrounding inflammatory changes. Spleen: Normal in size without focal abnormality. Adrenals/Urinary Tract: Adrenal glands are within normal limits bilaterally. Kidneys demonstrate a normal enhancement pattern. Mild peripelvic cysts are noted particularly on the left. No follow-up is recommended. No calculi or obstructive changes are seen. The bladder is decompressed by Foley catheter. Stomach/Bowel: Mild diverticular change of the colon is noted. Scattered fecal material is seen without evidence of constipation or obstruction. The appendix is within normal limits. Small bowel and stomach are unremarkable. Vascular/Lymphatic: Aortic atherosclerosis. No enlarged abdominal or pelvic lymph nodes. Reproductive: Status post hysterectomy. No adnexal masses. Other: No abdominal wall hernia or abnormality. No abdominopelvic ascites. Musculoskeletal: Prior right hip fixation is noted. Degenerative changes of lumbar spine are seen. Chronic T12 compression deformity is noted. IMPRESSION: Diverticulosis without diverticulitis. Mild retained fecal material without obstructive change. Electronically  Signed   By: Oneil Devonshire M.D.   On: 04/17/2024 21:10    Microbiology: Results for orders placed or performed during the hospital encounter of 05/14/24  Culture, blood (Routine x 2)     Status: None (Preliminary result)   Collection Time: 05/14/24  2:28 PM   Specimen: BLOOD RIGHT HAND  Result Value Ref Range Status   Specimen Description BLOOD RIGHT HAND  Final   Special Requests   Final    BOTTLES DRAWN AEROBIC AND ANAEROBIC Blood Culture results may not be optimal due to an inadequate volume of blood received in culture bottles   Culture   Final    NO GROWTH 2 DAYS Performed at [facility], [address], [place], [place] [address]  Report Status PENDING  Incomplete  Culture, blood (Routine x 2)     Status: Abnormal   Collection Time: 05/14/24  2:33 PM   Specimen: BLOOD LEFT HAND  Result Value Ref Range Status   Specimen Description BLOOD LEFT HAND  Final   Special Requests   Final    BOTTLES DRAWN AEROBIC AND ANAEROBIC Blood Culture results may not be optimal due to an inadequate volume of blood received in culture bottles   Culture  Setup Time   Final    GRAM POSITIVE COCCI IN CLUSTERS AEROBIC BOTTLE ONLY CRITICAL RESULT CALLED TO, READ BACK BY AND VERIFIED WITH: PHARMD G ABBOTT 05/16/2024 @ 0207 BY AB    Culture (A)  Final    STAPHYLOCOCCUS CAPITIS THE SIGNIFICANCE OF ISOLATING THIS ORGANISM FROM A SINGLE SET OF BLOOD CULTURES WHEN MULTIPLE SETS ARE DRAWN IS UNCERTAIN. PLEASE NOTIFY THE MICROBIOLOGY DEPARTMENT WITHIN ONE WEEK IF SPECIATION AND SENSITIVITIES ARE REQUIRED. Performed at [facility], [address], [place], [place] [address]    Report Status 05/16/2024 FINAL  Final  Blood Culture ID Panel (Reflexed)     Status: Abnormal   Collection Time: 05/14/24  2:33 PM  Result Value Ref Range Status   Enterococcus faecalis NOT DETECTED NOT DETECTED Final   Enterococcus Faecium NOT DETECTED NOT DETECTED Final   Listeria monocytogenes NOT DETECTED NOT  DETECTED Final   Staphylococcus species DETECTED (A) NOT DETECTED Final    Comment: CRITICAL RESULT CALLED TO, READ BACK BY AND VERIFIED WITH: PHARMD G ABBOTT 05/16/2024 @ 0207 BY AB    Staphylococcus aureus (BCID) NOT DETECTED NOT DETECTED Final   Staphylococcus epidermidis NOT DETECTED NOT DETECTED Final   Staphylococcus lugdunensis NOT DETECTED NOT DETECTED Final   Streptococcus species NOT DETECTED NOT DETECTED Final   Streptococcus agalactiae NOT DETECTED NOT DETECTED Final   Streptococcus pneumoniae NOT DETECTED NOT DETECTED Final   Streptococcus pyogenes NOT DETECTED NOT DETECTED Final   A.calcoaceticus-baumannii NOT DETECTED NOT DETECTED Final   Bacteroides fragilis NOT DETECTED NOT DETECTED Final   Enterobacterales NOT DETECTED NOT DETECTED Final   Enterobacter cloacae complex NOT DETECTED NOT DETECTED Final   Escherichia coli NOT DETECTED NOT DETECTED Final   Klebsiella aerogenes NOT DETECTED NOT DETECTED Final   Klebsiella oxytoca NOT DETECTED NOT DETECTED Final   Klebsiella pneumoniae NOT DETECTED NOT DETECTED Final   Proteus species NOT DETECTED NOT DETECTED Final   Salmonella species NOT DETECTED NOT DETECTED Final   Serratia marcescens NOT DETECTED NOT DETECTED Final   Haemophilus influenzae NOT DETECTED NOT DETECTED Final   Neisseria meningitidis NOT DETECTED NOT DETECTED Final   Pseudomonas aeruginosa NOT DETECTED NOT DETECTED Final   Stenotrophomonas maltophilia NOT DETECTED NOT DETECTED Final   Candida albicans NOT DETECTED NOT DETECTED Final   Candida auris NOT DETECTED NOT DETECTED Final   Candida glabrata NOT DETECTED NOT DETECTED Final   Candida krusei NOT DETECTED NOT DETECTED Final   Candida parapsilosis NOT DETECTED NOT DETECTED Final   Candida tropicalis NOT DETECTED NOT DETECTED Final   Cryptococcus neoformans/gattii NOT DETECTED NOT DETECTED Final    Comment: Performed at [facility], [address] [place], [place] [address]  Urine Culture      Status: Abnormal   Collection Time: 05/14/24  3:00 PM   Specimen: Urine, Catheterized  Result Value Ref Range Status   Specimen Description URINE, CATHETERIZED  Final   Special Requests   Final    NONE Performed at [facility], [address], [place], [place] [address]    Culture MULTIPLE SPECIES PRESENT, SUGGEST RECOLLECTION (A)  Final   Report Status 05/15/2024 FINAL  Final  Surgical pcr screen     Status: None   Collection Time: 05/14/24  8:45 PM   Specimen: Nasal Mucosa; Nasal Swab  Result Value Ref Range Status   MRSA, PCR NEGATIVE NEGATIVE Final   Staphylococcus aureus NEGATIVE NEGATIVE Final    Comment: (NOTE) The Xpert SA Assay (FDA approved for NASAL specimens in patients 32 years of age and older), is one component of a comprehensive surveillance program. It is not intended to diagnose infection nor to guide or monitor treatment. Performed at [facility], [address], [place], [place] [address]    Labs: CBC: Recent Labs  Lab 05/14/24 1431 05/14/24 1511 05/15/24 1012  WBC 19.0*  --  17.3*  NEUTROABS 15.1*  --  11.4*  HGB 10.3* 10.5* 10.2*  HCT 32.1* 31.0* 30.2*  MCV 95.8  --  94.1  PLT 428*  --  422*   Basic Metabolic Panel: Recent Labs  Lab 05/14/24 1431 05/14/24 1511 05/14/24 1735 05/14/24 2138 05/15/24 0149  NA 139 140 141 142 141  K 4.2 4.1 4.5 3.2* 3.3*  CL 104  --  107 111 112*  CO2 19*  --  16* 19* 21*  GLUCOSE 403*  --  369* 156* 111*  BUN 33*  --  28* 25* 23  CREATININE 1.55*  --  1.40* 1.11* 0.96  CALCIUM 8.7*  --  8.4* 8.0* 7.8*  MG  --   --  1.7  --   --   PHOS  --   --  3.7  --   --    Liver Function Tests: Recent Labs  Lab 05/14/24 1431  AST 13*  ALT 9  ALKPHOS 137*  BILITOT 1.2  PROT 6.1*  ALBUMIN  2.6*   CBG: Recent Labs  Lab 05/15/24 1133 05/15/24 1615 05/15/24 1656 05/15/24 2104 05/16/24 0607  GLUCAP 161* 50* 169* 68* 84    Discharge time spent: greater than 30 minutes.  Author: Yetta Blanch, MD  [facility]

## 2024-05-17 NOTE — Progress Notes (Signed)
   Palliative Medicine Inpatient Follow Up Note HPI: Patient with PMH of type II DM, HTN, hypothyroidism, HLD, recent right hip fracture glaucoma, GERD present to the hospital with complaints of confusion and lethargy. Palliative care is involved to support additional goals of care conversations.   Today's Discussion 05/17/2024  *Please note that this is a verbal dictation therefore any spelling or grammatical errors are due to the Dragon [employer] One system interpretation.  Chart reviewed inclusive of vital signs, progress notes, laboratory results, and diagnostic images. IV controlled substances reviewed. No medication changes required at this time.   I met with Jennifer Bailey this morning at bedside in the presence of her daughter, Jennifer Bailey. We discussed Jennifer Bailey's current medical condition and overall health state. She has had improvement in symptoms and an overall decrease in agitation.  Plan for transition to [facility] this morning.   Allowed grace for Jennifer Bailey to express herself given the difficulty of the situation she shares understanding of patient end of life care and supports an emphasis of comfort.   Questions and concerns addressed/Palliative Support Provided.   Objective Assessment: Vital Signs Vitals:   05/16/24 2023 05/17/24 0736  BP: (!) 154/58 (!) 162/99  Pulse: 92 (!) 105  Resp: 15 20  Temp: 97.6 F (36.4 C) 97.7 F (36.5 C)  SpO2: 100% 100%    Intake/Output Summary (Last 24 hours) at 05/17/2024 1141 Last data filed at 05/17/2024 9386 Gross per 24 hour  Intake --  Output 450 ml  Net -450 ml   Last Weight  Most recent update: 05/14/2024  5:21 PM    Weight  54.4 kg (120 lb)            Gen: Chronically ill-appearing elderly Caucasian female HEENT: Dry mucous membranes CV: Regular rate and rhythm PULM: On room air breathing is even and nonlabored ABD: soft/nontender EXT: Pedal edema is present Neuro: Somnolent  SUMMARY OF RECOMMENDATIONS   DNAR/DNI    Comfort care  Continue low-dose diazepam  and morphine  around-the-clock  Additional comfort medications per [facility]  Unrestricted visitation  Plan to transition to [facility] this morning ______________________________________________________________________________________ Jennifer Bailey [place] Palliative Medicine Team Team Cell Phone: [phone] Please utilize secure chat with additional questions, if there is no response within 30 minutes please call the above phone number  Billing based on MDM: High  Palliative Medicine Team providers are available by phone from 7am to 7pm daily and can be reached through the team cell phone.  Should this patient require assistance outside of these hours, please call the patient's attending physician.

## 2024-05-17 NOTE — Plan of Care (Signed)
  Problem: Coping: Goal: Ability to adjust to condition or change in health will improve Outcome: Progressing   Problem: Coping: Goal: Level of anxiety will decrease Outcome: Progressing

## 2024-05-17 NOTE — Plan of Care (Signed)
 Problem: Education: Goal: Ability to describe self-care measures that may prevent or decrease complications (Diabetes Survival Skills Education) will improve Outcome: Adequate for Discharge Goal: Individualized Educational Video(s) Outcome: Adequate for Discharge   Problem: Coping: Goal: Ability to adjust to condition or change in health will improve Outcome: Adequate for Discharge   Problem: Fluid Volume: Goal: Ability to maintain a balanced intake and output will improve Outcome: Adequate for Discharge   Problem: Health Behavior/Discharge Planning: Goal: Ability to identify and utilize available resources and services will improve Outcome: Adequate for Discharge Goal: Ability to manage health-related needs will improve Outcome: Adequate for Discharge   Problem: Metabolic: Goal: Ability to maintain appropriate glucose levels will improve Outcome: Adequate for Discharge   Problem: Nutritional: Goal: Maintenance of adequate nutrition will improve Outcome: Adequate for Discharge Goal: Progress toward achieving an optimal weight will improve Outcome: Adequate for Discharge   Problem: Skin Integrity: Goal: Risk for impaired skin integrity will decrease Outcome: Adequate for Discharge   Problem: Tissue Perfusion: Goal: Adequacy of tissue perfusion will improve Outcome: Adequate for Discharge   Problem: Education: Goal: Ability to describe self-care measures that may prevent or decrease complications (Diabetes Survival Skills Education) will improve Outcome: Adequate for Discharge Goal: Individualized Educational Video(s) Outcome: Adequate for Discharge   Problem: Cardiac: Goal: Ability to maintain an adequate cardiac output will improve Outcome: Adequate for Discharge   Problem: Health Behavior/Discharge Planning: Goal: Ability to identify and utilize available resources and services will improve Outcome: Adequate for Discharge Goal: Ability to manage health-related  needs will improve Outcome: Adequate for Discharge   Problem: Fluid Volume: Goal: Ability to achieve a balanced intake and output will improve Outcome: Adequate for Discharge   Problem: Metabolic: Goal: Ability to maintain appropriate glucose levels will improve Outcome: Adequate for Discharge   Problem: Nutritional: Goal: Maintenance of adequate nutrition will improve Outcome: Adequate for Discharge Goal: Maintenance of adequate weight for body size and type will improve Outcome: Adequate for Discharge   Problem: Respiratory: Goal: Will regain and/or maintain adequate ventilation Outcome: Adequate for Discharge   Problem: Urinary Elimination: Goal: Ability to achieve and maintain adequate renal perfusion and functioning will improve Outcome: Adequate for Discharge   Problem: Education: Goal: Knowledge of General Education information will improve Description: Including pain rating scale, medication(s)/side effects and non-pharmacologic comfort measures Outcome: Adequate for Discharge   Problem: Health Behavior/Discharge Planning: Goal: Ability to manage health-related needs will improve Outcome: Adequate for Discharge   Problem: Clinical Measurements: Goal: Ability to maintain clinical measurements within normal limits will improve Outcome: Adequate for Discharge Goal: Will remain free from infection Outcome: Adequate for Discharge Goal: Diagnostic test results will improve Outcome: Adequate for Discharge Goal: Respiratory complications will improve Outcome: Adequate for Discharge Goal: Cardiovascular complication will be avoided Outcome: Adequate for Discharge   Problem: Activity: Goal: Risk for activity intolerance will decrease Outcome: Adequate for Discharge   Problem: Nutrition: Goal: Adequate nutrition will be maintained Outcome: Adequate for Discharge   Problem: Coping: Goal: Level of anxiety will decrease Outcome: Adequate for Discharge   Problem:  Elimination: Goal: Will not experience complications related to bowel motility Outcome: Adequate for Discharge Goal: Will not experience complications related to urinary retention Outcome: Adequate for Discharge   Problem: Pain Managment: Goal: General experience of comfort will improve and/or be controlled Outcome: Adequate for Discharge   Problem: Safety: Goal: Ability to remain free from injury will improve Outcome: Adequate for Discharge   Problem: Skin Integrity: Goal: Risk for impaired skin  integrity will decrease Outcome: Adequate for Discharge   Problem: Education: Goal: Knowledge of the prescribed therapeutic regimen will improve Outcome: Adequate for Discharge   Problem: Coping: Goal: Ability to identify and develop effective coping behavior will improve Outcome: Adequate for Discharge   Problem: Clinical Measurements: Goal: Quality of life will improve Outcome: Adequate for Discharge   Problem: Respiratory: Goal: Verbalizations of increased ease of respirations will increase Outcome: Adequate for Discharge   Problem: Role Relationship: Goal: Family's ability to cope with current situation will improve Outcome: Adequate for Discharge Goal: Ability to verbalize concerns, feelings, and thoughts to partner or family member will improve Outcome: Adequate for Discharge   Problem: Pain Management: Goal: Satisfaction with pain management regimen will improve Outcome: Adequate for Discharge

## 2024-05-17 NOTE — Progress Notes (Signed)
 Updated report called to [facility]. Patient discharging with IV in place. [facility] here to transport.

## 2024-05-17 NOTE — TOC CM/SW Note (Signed)
 [facility] Liaison informed CSW and medical team that consents have been signed by family today. CSW requested ambulance transport for discharge at 10:30.

## 2024-05-17 NOTE — Progress Notes (Signed)
 Patient seen at bedside. Patient's daughter present. No concerns at this time. Patient remains stable for transfer to hospice facility. No changes to discharge summary.  Elgin Lam, MD [facility] 05/17/2024, 6:09 PM

## 2024-05-19 LAB — CULTURE, BLOOD (ROUTINE X 2): Culture: NO GROWTH

## 2024-06-05 DEATH — deceased
